# Patient Record
Sex: Female | Born: 1935
Health system: Southern US, Community
[De-identification: ages and names within clinical notes are randomized; demographics above are authoritative.]

## PROBLEM LIST (undated history)

## (undated) DIAGNOSIS — R112 Nausea with vomiting, unspecified: Secondary | ICD-10-CM

## (undated) DIAGNOSIS — I4891 Unspecified atrial fibrillation: Secondary | ICD-10-CM

## (undated) DIAGNOSIS — M542 Cervicalgia: Secondary | ICD-10-CM

## (undated) DIAGNOSIS — T8859XA Other complications of anesthesia, initial encounter: Secondary | ICD-10-CM

## (undated) DIAGNOSIS — C4492 Squamous cell carcinoma of skin, unspecified: Secondary | ICD-10-CM

## (undated) DIAGNOSIS — M069 Rheumatoid arthritis, unspecified: Secondary | ICD-10-CM

## (undated) DIAGNOSIS — Z9889 Other specified postprocedural states: Secondary | ICD-10-CM

## (undated) DIAGNOSIS — T4145XA Adverse effect of unspecified anesthetic, initial encounter: Secondary | ICD-10-CM

## (undated) DIAGNOSIS — I10 Essential (primary) hypertension: Secondary | ICD-10-CM

## (undated) DIAGNOSIS — R42 Dizziness and giddiness: Secondary | ICD-10-CM

## (undated) DIAGNOSIS — I5032 Chronic diastolic (congestive) heart failure: Secondary | ICD-10-CM

## (undated) DIAGNOSIS — I509 Heart failure, unspecified: Secondary | ICD-10-CM

## (undated) DIAGNOSIS — R609 Edema, unspecified: Secondary | ICD-10-CM

## (undated) DIAGNOSIS — R0602 Shortness of breath: Secondary | ICD-10-CM

## (undated) HISTORY — DX: Edema, unspecified: R60.9

## (undated) HISTORY — PX: SHOULDER SURGERY: SHX246

## (undated) HISTORY — DX: Chronic diastolic (congestive) heart failure: I50.32

## (undated) HISTORY — PX: JOINT REPLACEMENT: SHX530

## (undated) HISTORY — PX: OTHER SURGICAL HISTORY: SHX169

## (undated) HISTORY — DX: Cervicalgia: M54.2

## (undated) HISTORY — DX: Heart failure, unspecified: I50.9

## (undated) HISTORY — DX: Shortness of breath: R06.02

---
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
## 1994-12-13 HISTORY — PX: REPLACEMENT TOTAL KNEE: SUR1224

## 1999-02-16 ENCOUNTER — Other Ambulatory Visit: Admission: RE | Admit: 1999-02-16 | Discharge: 1999-02-16 | Payer: Self-pay | Admitting: Family Medicine

## 1999-12-14 HISTORY — PX: HYSTEROSCOPY: SHX211

## 2000-03-03 ENCOUNTER — Other Ambulatory Visit: Admission: RE | Admit: 2000-03-03 | Discharge: 2000-03-03 | Payer: Self-pay | Admitting: Family Medicine

## 2000-03-22 ENCOUNTER — Encounter (INDEPENDENT_AMBULATORY_CARE_PROVIDER_SITE_OTHER): Payer: Self-pay | Admitting: Specialist

## 2000-03-22 ENCOUNTER — Other Ambulatory Visit: Admission: RE | Admit: 2000-03-22 | Discharge: 2000-03-22 | Payer: Self-pay | Admitting: Obstetrics and Gynecology

## 2000-06-09 ENCOUNTER — Ambulatory Visit (HOSPITAL_COMMUNITY): Admission: RE | Admit: 2000-06-09 | Discharge: 2000-06-09 | Payer: Self-pay | Admitting: Orthopaedic Surgery

## 2000-06-09 ENCOUNTER — Encounter: Payer: Self-pay | Admitting: Orthopaedic Surgery

## 2000-07-01 ENCOUNTER — Encounter (INDEPENDENT_AMBULATORY_CARE_PROVIDER_SITE_OTHER): Payer: Self-pay | Admitting: Specialist

## 2000-07-01 ENCOUNTER — Ambulatory Visit (HOSPITAL_COMMUNITY): Admission: RE | Admit: 2000-07-01 | Discharge: 2000-07-01 | Payer: Self-pay | Admitting: Obstetrics and Gynecology

## 2001-02-24 ENCOUNTER — Other Ambulatory Visit: Admission: RE | Admit: 2001-02-24 | Discharge: 2001-02-24 | Payer: Self-pay | Admitting: Family Medicine

## 2002-02-26 ENCOUNTER — Encounter: Payer: Self-pay | Admitting: Family Medicine

## 2002-02-26 ENCOUNTER — Encounter: Admission: RE | Admit: 2002-02-26 | Discharge: 2002-02-26 | Payer: Self-pay | Admitting: Family Medicine

## 2003-02-03 ENCOUNTER — Ambulatory Visit (HOSPITAL_COMMUNITY): Admission: RE | Admit: 2003-02-03 | Discharge: 2003-02-03 | Payer: Self-pay | Admitting: Orthopaedic Surgery

## 2003-02-03 ENCOUNTER — Encounter: Payer: Self-pay | Admitting: Orthopaedic Surgery

## 2003-02-26 ENCOUNTER — Encounter: Payer: Self-pay | Admitting: Family Medicine

## 2003-02-26 ENCOUNTER — Encounter: Admission: RE | Admit: 2003-02-26 | Discharge: 2003-02-26 | Payer: Self-pay | Admitting: Family Medicine

## 2003-04-10 ENCOUNTER — Ambulatory Visit (HOSPITAL_COMMUNITY): Admission: RE | Admit: 2003-04-10 | Discharge: 2003-04-10 | Payer: Self-pay | Admitting: Gastroenterology

## 2003-04-23 ENCOUNTER — Other Ambulatory Visit: Admission: RE | Admit: 2003-04-23 | Discharge: 2003-04-23 | Payer: Self-pay | Admitting: Family Medicine

## 2003-12-14 HISTORY — PX: CERVICAL SPINE SURGERY: SHX589

## 2004-02-04 ENCOUNTER — Encounter: Admission: RE | Admit: 2004-02-04 | Discharge: 2004-02-04 | Payer: Self-pay | Admitting: Orthopaedic Surgery

## 2004-02-04 ENCOUNTER — Encounter: Payer: Self-pay | Admitting: Orthopaedic Surgery

## 2004-02-28 ENCOUNTER — Encounter: Payer: Self-pay | Admitting: Neurosurgery

## 2004-02-29 ENCOUNTER — Inpatient Hospital Stay (HOSPITAL_COMMUNITY): Admission: EM | Admit: 2004-02-29 | Discharge: 2004-03-07 | Payer: Self-pay | Admitting: Emergency Medicine

## 2004-03-02 ENCOUNTER — Encounter (INDEPENDENT_AMBULATORY_CARE_PROVIDER_SITE_OTHER): Payer: Self-pay | Admitting: Cardiology

## 2004-09-10 ENCOUNTER — Inpatient Hospital Stay (HOSPITAL_COMMUNITY): Admission: RE | Admit: 2004-09-10 | Discharge: 2004-09-12 | Payer: Self-pay | Admitting: Orthopaedic Surgery

## 2006-10-03 ENCOUNTER — Encounter: Admission: RE | Admit: 2006-10-03 | Discharge: 2006-10-03 | Payer: Self-pay | Admitting: Orthopaedic Surgery

## 2006-11-20 ENCOUNTER — Encounter: Admission: RE | Admit: 2006-11-20 | Discharge: 2006-11-20 | Payer: Self-pay | Admitting: Rheumatology

## 2008-02-06 ENCOUNTER — Encounter: Admission: RE | Admit: 2008-02-06 | Discharge: 2008-02-06 | Payer: Self-pay | Admitting: Family Medicine

## 2008-02-15 ENCOUNTER — Encounter: Admission: RE | Admit: 2008-02-15 | Discharge: 2008-02-15 | Payer: Self-pay | Admitting: Family Medicine

## 2008-02-27 ENCOUNTER — Encounter (INDEPENDENT_AMBULATORY_CARE_PROVIDER_SITE_OTHER): Payer: Self-pay | Admitting: Diagnostic Radiology

## 2008-02-27 ENCOUNTER — Encounter: Admission: RE | Admit: 2008-02-27 | Discharge: 2008-02-27 | Payer: Self-pay | Admitting: Family Medicine

## 2008-02-27 ENCOUNTER — Other Ambulatory Visit: Admission: RE | Admit: 2008-02-27 | Discharge: 2008-02-27 | Payer: Self-pay | Admitting: Diagnostic Radiology

## 2008-04-11 ENCOUNTER — Inpatient Hospital Stay (HOSPITAL_COMMUNITY): Admission: RE | Admit: 2008-04-11 | Discharge: 2008-04-12 | Payer: Self-pay | Admitting: Orthopaedic Surgery

## 2010-12-18 ENCOUNTER — Ambulatory Visit (HOSPITAL_COMMUNITY)
Admission: RE | Admit: 2010-12-18 | Discharge: 2010-12-18 | Payer: Self-pay | Source: Home / Self Care | Attending: Rheumatology | Admitting: Rheumatology

## 2011-01-26 ENCOUNTER — Emergency Department (HOSPITAL_COMMUNITY): Payer: Medicare Other

## 2011-01-26 ENCOUNTER — Inpatient Hospital Stay (HOSPITAL_COMMUNITY)
Admission: EM | Admit: 2011-01-26 | Discharge: 2011-01-29 | DRG: 069 | Disposition: A | Discharge status: Home / Self Care | Payer: Medicare Other | Attending: Internal Medicine | Admitting: Internal Medicine

## 2011-01-26 ENCOUNTER — Encounter (HOSPITAL_COMMUNITY): Payer: Self-pay | Admitting: Radiology

## 2011-01-26 HISTORY — DX: Dizziness and giddiness: R42

## 2011-01-26 HISTORY — DX: Rheumatoid arthritis, unspecified: M06.9

## 2011-01-26 HISTORY — DX: Essential (primary) hypertension: I10

## 2011-01-26 LAB — ETHANOL: Alcohol, Ethyl (B): <5 mg/dL (ref 0–10)

## 2011-01-26 LAB — DIFFERENTIAL
Basophils Absolute: 0 10*3/uL (ref 0.0–0.1)
Lymphs Abs: 0.9 10*3/uL (ref 0.7–4.0)
Monocytes Relative: 5 % (ref 3–12)
Neutro Abs: 8.1 10*3/uL — ABNORMAL HIGH (ref 1.7–7.7)
Neutrophils Relative %: 85 % — ABNORMAL HIGH (ref 43–77)

## 2011-01-26 LAB — CBC
MCH: 31.7 pg (ref 26.0–34.0)
Platelets: 259 10*3/uL (ref 150–400)
RBC: 4.23 MIL/uL (ref 3.87–5.11)
WBC: 9.5 10*3/uL (ref 4.0–10.5)

## 2011-01-26 LAB — COMPREHENSIVE METABOLIC PANEL
ALT: 11 U/L (ref 0–35)
Alkaline Phosphatase: 43 U/L (ref 39–117)
BUN: 12 mg/dL (ref 6–23)
CO2: 23 mEq/L (ref 19–32)
Creatinine, Ser: 0.8 mg/dL (ref 0.4–1.2)
GFR calc Af Amer: >60 mL/min (ref >60)
Total Bilirubin: 0.5 mg/dL (ref 0.3–1.2)

## 2011-01-26 LAB — URINALYSIS, ROUTINE W REFLEX MICROSCOPIC
Bilirubin Urine: NEGATIVE
Urine Glucose, Fasting: NEGATIVE mg/dL

## 2011-01-26 LAB — CK TOTAL AND CKMB (NOT AT ARMC)
CK, MB: 2.5 ng/mL (ref 0.3–4.0)
Relative Index: INVALID (ref 0.0–2.5)

## 2011-01-26 LAB — PROTIME-INR: INR: 0.96 (ref 0.00–1.49)

## 2011-01-26 LAB — POCT CARDIAC MARKERS
CKMB, poc: <1 ng/mL — ABNORMAL LOW (ref 1.0–8.0)
Troponin i, poc: <0.05 ng/mL (ref 0.00–0.09)

## 2011-01-27 ENCOUNTER — Inpatient Hospital Stay (HOSPITAL_COMMUNITY): Payer: Medicare Other

## 2011-01-27 DIAGNOSIS — W19XXXA Unspecified fall, initial encounter: Secondary | ICD-10-CM | POA: Diagnosis present

## 2011-01-27 DIAGNOSIS — Z96619 Presence of unspecified artificial shoulder joint: Secondary | ICD-10-CM

## 2011-01-27 DIAGNOSIS — Z981 Arthrodesis status: Secondary | ICD-10-CM

## 2011-01-27 DIAGNOSIS — M19019 Primary osteoarthritis, unspecified shoulder: Secondary | ICD-10-CM | POA: Diagnosis present

## 2011-01-27 DIAGNOSIS — E871 Hypo-osmolality and hyponatremia: Secondary | ICD-10-CM | POA: Diagnosis present

## 2011-01-27 DIAGNOSIS — Z96659 Presence of unspecified artificial knee joint: Secondary | ICD-10-CM

## 2011-01-27 DIAGNOSIS — E876 Hypokalemia: Secondary | ICD-10-CM | POA: Diagnosis present

## 2011-01-27 DIAGNOSIS — S139XXA Sprain of joints and ligaments of unspecified parts of neck, initial encounter: Secondary | ICD-10-CM | POA: Diagnosis present

## 2011-01-27 DIAGNOSIS — M5412 Radiculopathy, cervical region: Secondary | ICD-10-CM | POA: Diagnosis present

## 2011-01-27 DIAGNOSIS — I10 Essential (primary) hypertension: Secondary | ICD-10-CM | POA: Diagnosis present

## 2011-01-27 DIAGNOSIS — G459 Transient cerebral ischemic attack, unspecified: Principal | ICD-10-CM | POA: Diagnosis present

## 2011-01-27 DIAGNOSIS — IMO0002 Reserved for concepts with insufficient information to code with codable children: Secondary | ICD-10-CM

## 2011-01-27 DIAGNOSIS — Z7982 Long term (current) use of aspirin: Secondary | ICD-10-CM

## 2011-01-27 DIAGNOSIS — Z79899 Other long term (current) drug therapy: Secondary | ICD-10-CM

## 2011-01-27 DIAGNOSIS — M069 Rheumatoid arthritis, unspecified: Secondary | ICD-10-CM | POA: Diagnosis present

## 2011-01-27 DIAGNOSIS — T502X5A Adverse effect of carbonic-anhydrase inhibitors, benzothiadiazides and other diuretics, initial encounter: Secondary | ICD-10-CM | POA: Diagnosis present

## 2011-01-27 LAB — BASIC METABOLIC PANEL
BUN: 8 mg/dL (ref 6–23)
GFR calc Af Amer: >60 mL/min (ref >60)
GFR calc non Af Amer: >60 mL/min (ref >60)
Potassium: 3.7 mEq/L (ref 3.5–5.1)
Sodium: 136 mEq/L (ref 135–145)

## 2011-01-27 LAB — URINE CULTURE
Colony Count: NO GROWTH
Culture  Setup Time: 201202142031
Culture: NO GROWTH

## 2011-01-27 LAB — LIPID PANEL
LDL Cholesterol: 66 mg/dL (ref 0–99)
VLDL: 18 mg/dL (ref 0–40)

## 2011-01-27 LAB — HEMOGLOBIN A1C
Hgb A1c MFr Bld: 5.3 % (ref <5.7)
Mean Plasma Glucose: 105 mg/dL (ref <117)

## 2011-01-27 LAB — CBC
MCV: 90.5 fL (ref 78.0–100.0)
Platelets: 275 10*3/uL (ref 150–400)
RDW: 12.4 % (ref 11.5–15.5)
WBC: 7 10*3/uL (ref 4.0–10.5)

## 2011-01-27 LAB — SODIUM, URINE, RANDOM: Sodium, Ur: 107 mEq/L

## 2011-01-27 LAB — OSMOLALITY, URINE: Osmolality, Ur: 574 mOsm/kg (ref 390–1090)

## 2011-01-27 LAB — APTT: aPTT: 25 seconds (ref 24–37)

## 2011-01-27 MED ORDER — GADOBENATE DIMEGLUMINE 529 MG/ML IV SOLN
11.0000 mL | Freq: Once | INTRAVENOUS | Status: AC
  Administered 2011-01-27: 11 mL via INTRAVENOUS

## 2011-01-28 LAB — BASIC METABOLIC PANEL
CO2: 27 mEq/L (ref 19–32)
Calcium: 8.6 mg/dL (ref 8.4–10.5)
Creatinine, Ser: 0.7 mg/dL (ref 0.4–1.2)
GFR calc Af Amer: >60 mL/min (ref >60)
Glucose, Bld: 94 mg/dL (ref 70–99)

## 2011-01-29 DIAGNOSIS — I059 Rheumatic mitral valve disease, unspecified: Secondary | ICD-10-CM

## 2011-02-02 LAB — CULTURE, BLOOD (ROUTINE X 2)
Culture  Setup Time: 201202150131
Culture: NO GROWTH

## 2011-02-06 NOTE — Discharge Summary (Signed)
[name], [name]              ACCOUNT NO.:  [id number]  MEDICAL RECORD NO.:  [id number]           PATIENT TYPE:  I  LOCATION:  3729                         FACILITY:  [facility]  PHYSICIAN:  Isidor Holts, M.D.  DATE OF BIRTH:  09/10/1928  DATE OF ADMISSION:  01/26/2011 DATE OF DISCHARGE:  01/29/2011                              DISCHARGE SUMMARY   PRIMARY MD:  Gentry Fitz.  However, the patient is scheduled to see Emeterio Reeve, MD  PRIMARY ORTHOPEDIC SURGEON:  Claude Manges. [name], MD  PRIMARY NEUROSURGEON:  Danae Orleans. Venetia Maxon, MD  DISCHARGE DIAGNOSES: 1. Possible transient ischemic attack. 2. Transient altered mental status. 3. Hyponatremia/hypokalemia, secondary to thiazide diuretics. 4. Left shoulder degenerative joint disease. 5. Cervical radiculopathy. 6. Cervical paraspinal muscle strain. 7. History of rheumatoid arthritis. 8. Hypertension.  DISCHARGE MEDICATIONS: 1. Ultram 25 mg p.o. p.r.n. q.8 hourly for pain. A 7-day supply has     been dispensed. 2. Amlodipine 10 mg p.o. daily (was on 5 mg p.o. daily). 3. Aspirin 325 mg p.o. daily (was on 81 mg p.o. daily). 4. Enalapril 20 mg p.o. b.i.d. (was on 20 mg p.o. daily). 5. Calcium carbonate/vitamin D OTC 1 tablet p.o. b.i.d. 6. Docusate 100 mg p.o. b.i.d. 7. Fish oil 1000 mg p.o. daily. 8. Meloxicam 7.5 mg p.o. daily. 9. Metoprolol tartrate 50 mg p.o. b.i.d. 10.Plaquenil 200 mg p.o. b.i.d. 11.Prednisone 5 mg p.o. daily. 12.Vitamin D3 1000 units p.o. b.i.d.  Note: Hydrochlorothiazide has been discontinued, secondary to     electrolyte abnormalities.  CONSULTATIONS: 1. Claude Manges. [name], MD, orthopedic surgeon. 2. Telephone discussion with Hilda Lias, MD, neurosurgeon.  PROCEDURES: 1. Chest x-ray January 26, 2011.  This showed no acute     cardiopulmonary process.  There was emphysematous change. 2. Head CT scan January 26, 2011.  This showed no acute finding. 3. Brain MRI/MRA January 27, 2011.  This  showed no acute infarct.     There was nonspecific pituitary lesion which may represent a     Rathke's cleft cyst previously 5 x 6 x 12 mm now 8.8 x 7.7 x6.1 mm.     MRA showed mild branch vessel irregularity.  AICA is not     visualized. 4. Neck MRA January 28, 2011.  This showed focal narrowing of the     proximal right internal carotid artery with 50% diameter narrowing.     No evidence of measurable stenosis involving the proximal left     internal carotid artery.  Minimal narrowing proximal vertebral     arteries.  Ectasia of the proximal left vertebral artery with focal     mild narrowing. 5. MRI C-spine January 28, 2011.  This showed solid anterior fusion     from C2-C6 with no residual impingement.  Focal myelomalacia of the     left side of spinal cord at C3-C4.  Slight edema in the posterior     paraspinal soft tissue from C7-T2 just to the left of midline which     could represent muscle strain.  Left foraminal stenosis at C6-C7     due to uncinate spurs. 6. A  2-D echocardiogram January 29, 2011, report was still pending at     the time of this dictation.  ADMISSION HISTORY:  As in H and P notes of January 26, 2011, dictated by Dr. Della Goo.  However, in brief, this is an 77 year old female, with known history of rheumatoid arthritis under the care of Dr. Corliss Skains, rheumatologist, hypertension, degenerative disk disease, vertigo, previous history of cervical spine fusion, status post right total knee replacement, status post bilateral shoulder replacement, presenting following an episode of transient confusion as well as possible left-sided weakness while at her rheumatologist office for an appointment.  She was brought to the emergency department by EMS.  At the time she arrived at the ED, her symptoms had resolved. She was subsequently admitted for further evaluation, investigation and management.  CLINICAL COURSE: 1. Possible TIA.  For details of  presentation, refer to admission     history above.  The patient had no focal neurologic deficit on     physical examination.  She underwent CAD/CVA workup.  For details     of findings, refer to procedure list above.  No concerning findings     were documented, although she did have a focal 50% right ICA     carotid stenosis.  This is likely not the course of her     symptomatology.  The patient was already on low-dose aspirin.     Preadmission dose increased to 325 mg p.o. daily.  She was     evaluated by PT, OT during the course of this hospitalization.  2. Electrolyte abnormalities.  The patient did have hyponatremia of     127 at presentation, with a potassium of 3.4.  This was deemed     likely secondary to the patient's thiazide diuretic therapy.     Thiazide was discontinued.  The patient was managed with     intravenous infusion of normal saline.  As of January 28, 2011,     sodium was reasonable at 133 and potassium was 4.0 following     repletion.  3. Hypertension.  Following discontinuation of hydrochlorothiazide,     patient's blood pressure, started creeping up.  Amlodipine has     been increased to 10 mg p.o. daily and enalapril to 20 mg p.o. b.i.d.  4. Cervical radiculopathy/cervical paraspinal muscle strain.  The     patient did complain of pain down her left arm, which she had noted     since having a fall in late January 2012.  Reportedly, this had     become worse and according to the patient, she had undergone     considerable positioning while trying to obtain a CT scan in     January 2012.  She is increasingly troubled by this.  On suspicion     of possible radiculopathy, MRI of the cervical spine was done.     This demonstrated C6-C7 neuroforaminal stenosis secondary to     uncinate spurs which could presumably cause nerve impingement.  In     addition, she also had a posterior paraspinal muscle edema just at     left of the midline which could suggest a  strain.  We did consult     Dr. Hilda Lias, who was covering for Dr. Venetia Maxon, Neurosurgeon.  He     spoke with me over the phone, personally, reviewed the MRI     findings and has opined that the patient does indeed have a     strain.  He recommended PT, OT as well as analgesics and has     recommended that the patient followup in the office, should symptoms     not improve.  5. History of rheumatoid arthritis.  The patient is clinically stable     from this viewpoint.  She will continue to follow up with her primary     rheumatologist on discharge.  6. Hypertension.  The patient's blood pressure did creep up somewhat     following discontinuation of her hydrochlorothiazide. As described     above, adjustments have been made to her enalapril and amlodipine dosage.  DISPOSITION:  The patient was considered clinically stable for discharge on January 29, 2011.  ACTIVITY:  As tolerated. Recommended to increase activity slowly.  DIET:  Heart-healthy.  FOLLOWUP INSTRUCTIONS:  The patient is to follow up routinely with her primary orthopedic surgeon, Dr. Norlene Campbell.  Follow up routinely with her primary rheumatologist, Dr. Corliss Skains.  She is currently scheduled to commence follow up with Dr. Paulino Rily, her new primary MD.  She has been urged to keep this appointment.  In addition, she will follow up p.r.n. with Dr. Venetia Maxon, should she continue to have left arm pain.  All of this have been communicated to the patient and her family. They have verbalized understanding.     Isidor Holts, M.D.     CO/MEDQ  D:  01/29/2011  T:  01/30/2011  Job:  [id number]  cc:   Emeterio Reeve, MD Danae Orleans Venetia Maxon, M.D. Claude Manges. [name], M.D. Pollyann Savoy, M.D.  Electronically Signed by Isidor Holts M.D. on 02/06/2011 01:41:52 PM

## 2011-02-15 NOTE — H&P (Signed)
[name], [name]              ACCOUNT NO.:  [id number]  MEDICAL RECORD NO.:  [id number]           PATIENT TYPE:  E  LOCATION:  [facility]                         FACILITY:  [facility]  PHYSICIAN:  Della Goo, M.D. DATE OF BIRTH:  04/15/1928  DATE OF ADMISSION:  01/26/2011 DATE OF DISCHARGE:                             HISTORY & PHYSICAL   PRIMARY CARE PHYSICIAN:  Unassigned  CHIEF COMPLAINT:  Left-sided weakness, facial droop, and confusion.  HISTORY OF PRESENT ILLNESS:  This is an 77 year old female who was brought to the emergency department after suffering an episode of sudden facial droop, left-sided weakness along with confusion.  This occurred while she was at her rheumatologist's office for an appointment in the afternoon.  The patient states that she fell prior to the episode, she felt suddenly sick, dizzy, and nauseous.  She felt as if she would faint.  She denied having any headache or chest pain or shortness of breath.  EMS was called and by the time EMS arrived, her symptoms had resolved.  The patient was brought to the emergency department for further evaluation.  The patient also has complaints of worsening pain in her left arm.  She states this pain has been there since a fall she suffered in January. She describes pain that radiated down her left arm into her fingers. She was sent for a CT scan of the right shoulder which was done in December 18, 2010, to evaluate for an occult fracture.  There were no fractures that were seen.  The patient was found to have an effusion around the left elbow.  The CT scan was ordered by her rheumatologist, Dr. Corliss Skains.  PAST MEDICAL HISTORY:  Significant for rheumatoid arthritis, hypertension, degenerative disk disease, and vertigo.  PAST SURGICAL HISTORY:  History of cervical spine fusion, right total knee replacement, left shoulder replacement x2, right shoulder replacement x1.  MEDICATIONS:  Her medications at this time  include prednisone, Plaquenil, stool softener, meloxicam, enalapril, metoprolol, vitamin D, fish oil, and aspirin.  ALLERGIES:  No known drug allergies.  SOCIAL HISTORY:  The patient is a nonsmoker and nondrinker.  No history of illicit drug usage.  FAMILY HISTORY:  Positive for hypertension in her mother and her father had Alzheimer dementia.  REVIEW OF SYSTEMS:  Pertinents are mentioned above.  PHYSICAL EXAMINATION FINDINGS:  GENERAL:  This is an 77 year old thin, well-nourished, well-developed female who is in no visible discomfort or acute distress. VITAL SIGNS:  Temperature 98.3, blood pressure 158/61, heart rate 61, respirations 14, and O2 saturations 99%. HEENT:  Normocephalic and atraumatic.  Pupils are equally round reactive to light.  Extraocular movements are intact.  Funduscopic benign.  There is no scleral icterus.  Nares are patent bilaterally.  Oropharynx is clear. NECK:  Supple full range of motion.  No thyromegaly, adenopathy, or jugular venous distention. CARDIOVASCULAR:  Regular rate and rhythm.  No murmurs, gallops, or rubs appreciated.  LUNGS:  Clear to auscultation bilaterally.  No rales, rhonchi, or wheezes. ABDOMEN:  Positive bowel sounds, soft, nontender, and nondistended.  No hepatosplenomegaly. EXTREMITIES:  Without cyanosis, clubbing, or edema.  The upper extremities do  have stenting and ecchymotic areas from her prednisone treatment. NEUROLOGIC:  The patient is alert and oriented x3.  Cranial nerves are intact.  She is able to move all 4 of her extremities.  She has limited range of motion in her left shoulder.  The patient walks with a cane. VASCULAR:  Her radial and dorsal pedal pulses are intact.  LABORATORY STUDIES:  Chemistry with a sodium of 129, potassium 3.4, chloride 93, CO2 of 23, BUN 12, creatinine 0.80, glucose of 143. Protime 13.0, INR 0.96, lactic acid level 1.2, and alcohol level less than 5.0.  Point-of-care cardiac markers with a  myoglobin of 95.4, CK-MB less than 1.0, troponin less than 0.05, total creatinine kinase 81, CK- MB 2.5, and troponin 0.01.  Urinalysis negative.  EKG normal sinus rhythm.  No acute ST-segment changes seen.  CT scan of the head performed and found to be negative for any acute findings.  Chest x-ray also reveals no acute disease process.  ASSESSMENT:  An 77 year old female being admitted with: 1. Transient ischemic attack. 2. Altered mental status. 3. Hypertension. 4. Hyponatremia. 5. Rheumatoid arthritis. 6. Mild hypokalemia. 7. Left shoulder radiculopathy.  PLAN:  The patient will be admitted for 23-hour observation to a telemetry bed.  The TIA protocol has been initiated.  Neurologic checks will be performed, cardiac enzymes.  The patient will be sent for MRI, MRA of the brain if she can have this study, however she does have bilateral shoulder replacements with.  The patient's regular medications will be further verified.  She will be placed on aspirin therapy and DVT prophylaxis will also be ordered.  The patient is a full code at this time and further workup will ensue pending results of the patient's clinical course.  The Orthopedic Service will be consulted for evaluation of this patient.  The patient sees regularly Dr. [name] who will be notified of the patient's admission and her left shoulder problems.  Dr. [name] may see the patient in the a.m. or make an arrangement to see the patient as outpatient.     Della Goo, M.D.     HJ/MEDQ  D:  01/26/2011  T:  01/26/2011  Job:  [id number]  Electronically Signed by Della Goo M.D. on 02/15/2011 08:17:33 PM

## 2011-04-27 NOTE — Op Note (Signed)
NAMEOLIVER, NEUWIRTH              ACCOUNT NO.:  [id number]   MEDICAL RECORD NO.:  [id number]          PATIENT TYPE:  INP   LOCATION:  5009                         FACILITY:  [facility]   PHYSICIAN:  Claude Manges. Whitfield, M.D.DATE OF BIRTH:  May 21, 1928   DATE OF PROCEDURE:  04/11/2008  DATE OF DISCHARGE:                               OPERATIVE REPORT   PREOPERATIVE DIAGNOSES:  End-stage osteoarthritis, right shoulder, with  rotator cuff insufficiency.   POSTOPERATIVE DIAGNOSES:  1. End-stage osteoarthritis, right shoulder, with rotator cuff      insufficiency.  2. 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   PROCEDURE:  Right shoulder hemiarthroplasty with computed tomographic  angiography head.   SURGEON:  Claude Manges. [name], MD   ASSISTANT:  Lenard Galloway. Chaney Malling, MD   ANESTHESIA:  General with supplemental interscalene nerve block.   COMPLICATIONS:  None.   COMPONENTS:  DePuy Global Advantage 12-mm humeral stem, a 44-mm outer  diameter CTA head with a 23-mm neck.   PROCEDURE:  With the patient comfortable on the operating table and  under general orotracheal anesthesia, the patient was placed in a semi-  sitting position with the Allen shoulder frame.  The patient did receive  a preoperative interscalene nerve block in the holding area.  The right  shoulder was then prepped with Betadine scrub and then DuraPrep with the  base of the neck circumferentially to the wrist, sterile draping was  performed.   Marking pen was used to outline an incision in the deltopectoral grooves  beginning at the level of the coronoid extending to the axillary fold.  By a sharp dissection, incision was carried down to the subcutaneous  tissue.  Gross bleeders were Bovie coagulated.  Adipose tissue was then  incised.  By a blunt dissection, I was able to locate the deltopectoral  groove.  The cephalic vein was carefully protected and retracted  laterally.  By a further blunt dissection, the  deltopectoral groove was  delineated.  Self-retaining retractors were inserted.  There was  abundant adipose tissue in the area of the clavipectoral fascia.  This  was incised, so that I could retract it medially and laterally.  The  coracoid was identified.  The conjoined tendon was identified and  carefully retracted.  There was considerable bulging of the capsule and  synovial fluid.  The subscapularis was identified.  It was tagged  superiorly and inferiorly and then incised with a Bovie at about a  centimeter from its attachment to the greater tuberosity.  There was  abundant cloudy yellow joint fluid.  There was also considerable  synovial hypertrophy with what appeared to be synovial  osteochondromatosis.  There was a similar finding when she had this  shoulder hemiarthroplasty in the left shoulder years ago.  A synovectomy  and capsulectomy was performed.   The head was then delivered into the wound.  There was complete absence  of articular cartilage.  At the superior aspect of the head and in the  midline, a small drill hole was then made.  Rasping was performed  sequentially with the hand reamers to a level of 12 mm.  The external  cutting guide was then applied to the reamer with the appropriate amount  of retroversion, an osteotomy was then made of the head.  We tracked it  around the humeral head and osteophytes were removed with an osteotome,  as there were considerable osteophytes inferiorly and posteriorly.   The glenoid was inspected.  It appeared to be minimally misshapen, and  there was still some articular cartilage remaining.  There were no rough  spots.   Further synovectomy was then performed as we had better visualization of  the joint.   Rasping was then performed sequentially from an 8 to a 10 and then 12-mm  rasp.  We trialed a standard humeral head, and we felt that the  retroversion was perfect, and the broach fit perfectly on the cut edge  of the  humeral head.   We elected to use a CTA head because of the patient's significant  autoimmune disease, synovitis, and the fact that in her previous  shoulder, she had developed a rotator cuff insufficiency over time and  required further surgery.  In order to avoid any further problems, we  felt that the CTA head was the best approach.  Accordingly, the CTA  cutting guide was then applied to the broach and a cut was then made in  the superior humeral head.  We carefully retracted the biceps tendon.  There was synovitis of the biceps tendon sheath, this was resected, and  I thought the biceps tendon otherwise was fine.  There were some areas  that were slightly frayed, but the majority of the tendon appeared to be  intact, and I felt that it was appropriate to preserve it.   We then tried the 44-mm outer diameter CTA head.  We tried both the 18-  and the 23-mm neck length and felt that the 23 was still loose enough  that we would have at least 50% posterior subluxation, slight inferior  subluxation, and we were able to raise the arm over the head without any  impingement.   The CTA head was then removed.  The 12-mm broach was then removed.  The  joint was copiously irrigated with saline solution.  With retractors  carefully placed about the humeral head, the 12-mm Global Advantage  humeral stem was then impacted flush on the cut surface of the humerus.  We cleaned the reverse Morse taper neck and then inserted the 44-mm  outer diameter CTA head with a 23-mm neck length.  This fit nicely  without instability.  The joint was again inspected without evidence of  loose material.  The joint was then reduced and we had excellent range  of motion, no impingement, and no further evidence of synovitis.   I again inspected the biceps tendon and felt that it was not pathologic  enough to perform a tenodesis.  The CA ligament appeared to be intact.   Subscapularis was then reattached anatomically  with #1 Ethibond.  The  deltopectoral groove was closed with a running 0-Vicryl.  The cephalic  vein remained intact.  The subcu was closed in several layers with 0 and  2-0 Vicryl.  The skin closed with Steri-Strips over Benzoin.  Sterile  bulky dressing was applied followed by a sling.   The patient tolerated the procedure well without complications.      Claude Manges. [name], M.D.  Electronically Signed     PWW/MEDQ  D:  04/11/2008  T:  04/12/2008  Job:  [id number]

## 2011-04-30 NOTE — Consult Note (Signed)
[name], [name]                        ACCOUNT NO.:  [id number]   MEDICAL RECORD NO.:  [id number]                   PATIENT TYPE:  INP   LOCATION:  3729                                 FACILITY:  [facility]   PHYSICIAN:  Gustavus Messing. Orlin Hilding, M.D.          DATE OF BIRTH:  1928-06-29   DATE OF CONSULTATION:  03/02/2004  DATE OF DISCHARGE:                                   CONSULTATION   CHIEF COMPLAINT:  Syncope.   HISTORY OF PRESENT ILLNESS:  [name] is a 77 year old white woman with a  history of hypertension, hyperlipidemia, and cervical myelopathy due to have  cervical surgery tomorrow by Dr. Venetia Maxon who was shopping with her daughter on  Saturday afternoon, was sitting on a bench, felt dizziness, felt her vision  blacking out, and then passed out. Daughter who is a nurse said that she  seemed briefly apneic and that her hands drew up, and she had a funny look  on face but no incontinence, tongue biting, or compulsive activity. There  was no significant post ictal period. When she tried to sit up, she felt  dizzy again.   REVIEW OF SYSTEMS:  Positive for left shoulder pain and some Lhermitte's  type phenomenon. She also has seasonal allergies. No chest pain or shortness  of breath.   PAST MEDICAL HISTORY:  1. Significant for hypertension.  2. Hyperlipidemia.  3. Right total knee replacement.  4. Remote hysterectomy.  5. Cervical myelopathy due for surgery soon.  6. Left shoulder problems, due for surgery.  7. She also has diverticulosis.   MEDICATIONS:  1. Aspirin.  2. Lisinopril.  3. Hydrochlorothiazide.  4. Prempro.  5. Lipitor.  6. Clarinex.  7. Flonase.  8. Mobic.  9. Unisom.   ALLERGIES:  No known drug allergies. She has poor tolerance to narcotics,  however.   SOCIAL HISTORY:  She is married, retired. Remote cigarette use, no alcohol.   FAMILY HISTORY:  Stepsister had seizures related to a brain tumor.   PHYSICAL EXAMINATION:  VITAL SIGNS:  On exam,  temperature 98, pulse 68,  respirations 20, BP 114/60, 97% saturation.  HEENT:  Head is normocephalic, atraumatic.  NECK:  Supple without bruits.  HEART:  Regular rhythm.  EXTREMITIES:  Without edema. Decreased range of motion of the left shoulder.  NEUROLOGICAL:  Mental status:  She is awake, alert, perfectly oriented with  normal language and cognition. Cranial nerves:  Pupils were equal and  reactive. Visual fields were full. Extraocular movements were intact. Facial  sensation was normal. Facial and motor activity was intact. Hearing is  intact. Palate symmetric. Tongue is midline. On motor exam, she has normal  station and gait; normal bulk, tone and strength throughout with exception  of limited left upper extremity proximal range of motion and pain. There is  drift or satelliting. Normal rapid fine movements. No fasciculations,  atrophy, or tremor. Reflexes are brisk at 3+ but no clonus.  Questionable  upgoing toe on the left, normal on the right. Coordination:  Finger to nose,  heel to shin are intact. Sensory exam is intact. MRI of the brain is  essentially normal with minimal atrophy and small vessel disease compatible  with her medical history.   STUDIES:  MRA shows about a 50% right ICA origin stenosis, not considered  significant, and normal vertebrobasilar system.   LABORATORY DATA:  Unremarkable.   IMPRESSION:  Syncope, not convincing for seizure or vertebrobasilar  insufficiency. Normal exam except for left shoulder and myelopathic  findings. MRI and MRA are unrevealing.   RECOMMENDATIONS:  For completeness sake, would get an EEG to rule out  seizure activity but I doubt this is the source. Would also check  orthostatics and agree with cardiac evaluation. She is asymptomatic since  admission.                                               Catherine A. Orlin Hilding, M.D.    CAW/MEDQ  D:  03/02/2004  T:  03/03/2004  Job:  [id number]   cc:   Danae Orleans. Venetia Maxon, M.D.  [address]  [place]  [place] [address]  Fax: [phone]

## 2011-04-30 NOTE — Discharge Summary (Signed)
[name], [name]              ACCOUNT NO.:  [id number]   MEDICAL RECORD NO.:  [id number]          PATIENT TYPE:  INP   LOCATION:  5037                         FACILITY:  [facility]   PHYSICIAN:  [name], M.D.DATE OF BIRTH:  1928/03/12   DATE OF ADMISSION:  09/10/2004  DATE OF DISCHARGE:  09/12/2004                                 DISCHARGE SUMMARY   ADMISSION DIAGNOSES:  1.  End-stage osteoarthritis, left shoulder.  2.  Hypertension.  3.  Hypercholesterolemia.  4.  Seasonal allergies.  5.  Small cataract.  6.  Severe degenerative disk disease, lumbar spine.  7.  Cervical radiculopathy status post C3-C4, C4-C5 fusion, 3/05.   DISCHARGE DIAGNOSES:  1.  End-stage osteoarthritis, left shoulder, status post left shoulder      hemiarthroplasty.  2.  Acute blood loss anemia secondary to surgery.  3.  Hypertension.  4.  Hypercholesterolemia.  5.  Seasonal allergies.  6.  Small cataract.  7.  Degenerative disk disease, lumbar spine.  8.  History of cervical radiculopathy status post C3-C4, C4-C5 fusion,      03/05/04.   SURGICAL PROCEDURE:  On 09/10/04, Ms. [name] underwent a left shoulder  hemiarthroplasty with removal of loose bodies by Dr. Claude Manges. Lamb  assisted by Dr. Rinaldo Lamb and [name], P.A.-C.  Complications:  None.   CONSULTATIONS:  Physical therapy, occupational therapy and case management  consulted on 09/11/04.   HISTORY OF PRESENT ILLNESS:  This 77 year old white female patient presented  to Dr. Cleophas Lamb with a three-year history of gradual onset of progressive  left shoulder pain.  The patient has had no known injury to her shoulder but  has been having problems for a while.  The pain is constant, stabbing and  aching over the shoulder with increasing range of motion of the shoulder and  decreased with rest.  The shoulder grinds and itches.  The pain awakens her  at night.  Her surgery initially was cancelled for March due to cervical  radicular and she subsequently had a cervical fusion and now is ready for  her left shoulder hemiarthroplasty.   HOSPITAL COURSE:  Ms. [name] tolerated her surgical procedure well without  any immediate postoperative complications.  She was transferred to 5000.  On  postoperative day 1 she was afebrile, vital signs stable.  Hemoglobin 10.3,  hematocrit 29.3, the arm was neurovascular intact.  She was switched to p.o.  pain medications and was out of bed with therapy.  On postoperative day 2  she was doing well.  The pain was controlled with medications, she was  afebrile, the arm was neurovascularly intact and the incision benign.  It  was felt that she was ready for discharge home.  She was discharged home  later that day.   DISCHARGE INSTRUCTIONS:  Diet:  She can resume her regular pre  hospitalization diet.   DISCHARGE MEDICATIONS:  She may resume her home medications.   1.  Caduet 5/10 mg p.o. q.p.m.  2.  Enalapril/hydrochlorothiazide 10/25 mg p.o. q.a.m.  3.  Clarinex 5 mg p.o. q.a.m.  4.  Flonase two  squirts q. nares daily.  5.  Aspirin 81 mg p.o. q.a.m.  6.  Stool softener q.p.m.  7.  Metoprolol 50 mg p.o. b.i.d.  8.  Unisom half tablet p.o. q.h.s.  9.  Mobic 7.5 mg p.o. q.a.m.   Additional medication includes:   Vicodin ES one to two tablets p.o. q.4h. p.r.n. for pain, #45 with no  refills.   ACTIVITY:  She is to be out of bed as tolerated with the left arm in a  sling.  She is to do pendulum exercises two to three times a day.  She can  use ice on that shoulder as needed.   WOUND CARE:  Please keep the left shoulder incision clean and dry, may  shower after no drainage from the wound for 2 days.  She is to notify Dr.  Cleophas Lamb if temperature greater than or equal to 101.5 degrees Fahrenheit,  chills, pain unrelieved by pain medications or foul smelling drainage from  the wound.   She is to arrange for home health PT per [facility].   FOLLOW UP:   She is to follow up with Dr. Cleophas Lamb in our office in  approximately 1 week and needs to call [phone] for that appointment.   LABORATORY DATA:  On 09/11/04, white count 13.1, hemoglobin 10.3, hematocrit  29.3, and platelets 294,000.  On 09/11/04, sodium 131, potassium 3.6, glucose  153.  All other laboratory studies were within normal limits.       KED/MEDQ  D:  10/15/2004  T:  10/15/2004  Job:  [id number]

## 2011-04-30 NOTE — H&P (Signed)
[name], REBMAN                        ACCOUNT NO.:  [id number]   MEDICAL RECORD NO.:  [id number]                   PATIENT TYPE:  INP   LOCATION:  NA                                   FACILITY:  [facility]   PHYSICIAN:  [name], P.A.                DATE OF BIRTH:  [birth year]   DATE OF ADMISSION:  02/11/2004  DATE OF DISCHARGE:                                HISTORY & PHYSICAL   CHIEF COMPLAINT:  Three year history of gradual onset left shoulder pain.   HISTORY OF PRESENT ILLNESS:  This 77 year old white female patient presents  to Dr. Cleophas Lamb with a three year history of gradual onset but  progressively worsening left shoulder pain. She has no known injury or prior  surgery to her shoulder but had been seen about two years ago for complaints  of pain in that shoulder. At this point, the pain is pretty much a constant  stabbing or aching sensation over that shoulder area. The intensity varies  throughout the day and is dependent upon what she does. Pain increases with  any range of motion of the shoulder and decreases with rest. The shoulder  does feel like it grinds at times and it also itches at times. The patient  awakens her at night, and she cannot sleep on that left side. She is  currently taking Mobic for pain relief, and that provides just a minimal  amount of relief.   ALLERGIES:  She has no specific drug allergies but has problems with nausea  due to pain medications.   CURRENT MEDICATIONS:  1. Lipitor 10 mg one tablet p.o. q.a.m.  2. Prempro 0.625/5 mg one tablet p.o. q.a.m.  3. Enalapril 10/25 mg one tablet p.o. q.a.m.  4. Clarinex 5 mg one tablet p.o. q.a.m.  5. Flonase nasal spray two squirts each naris q.a.m.  6. Mobic 7.5 mg one tablet p.o. q.a.m.  7. Baby aspirin 81 mg one tablet p.o. q.a.m.  8. Stool softener one tablet p.o. q.p.m.  9. Unisom half tablet p.o. q.h.s.   PAST MEDICAL HISTORY:  1. Hypertension for the last 15 years.  2.  Hypercholesterolemia.  3. Seasonal allergies.   She denies any history of diabetes mellitus, thyroid disease, hiatal hernia,  peptic ulcer disease, heart disease, asthma, or any other chronic medical  condition other than noted previously.   PAST SURGICAL HISTORY:  1. Right total knee arthroplasty by Dr. Claude Manges. Whitfield June 1996.  2. Hysteroscopy by Dr. Lake Lamb July 2001.   She denies any complications from the above mentioned procedures.   SOCIAL HISTORY:  She has a 30-pack-year history of cigarette smoking which  she quit about 35 years ago. She does not drink any alcohol nor use any  drugs. She is married and has two children. She and her husband live in a  one story house with one step into the main entrance. She  is retired from  part time office work. Her medical doctor is Dr. Talmadge Lamb, and her  phone number is [phone].   FAMILY HISTORY:  Mother died at the age of 61 with hypertension, emphysema,  and complications of pneumonia. Father died at the age of 28 with  complications from Alzheimer's disease. She did have one half sister who  died at age 24 with a brain tumor. Her children are age 53 and 58, and they  are alive and healthy.   REVIEW OF SYSTEMS:  She does have an early cataract. She complains of some  stiffness in her neck at times and recently has noted when she leans her  head forward to eat she gets sharp pain shooting down both arms. It now  seems to have localized to the left arm. She has been told in the past she  has a benign heart murmur. She does have occasional hemorrhoids. She has  significant degenerative disk disease in her lumbar spine and has problems  with bilateral feet numbness and left leg pain which is followed by Dr.  Venetia Lamb. Because of this, she does use a cane for ambulation. She does wear  glasses. She does not have a living will or a power of attorney. All other  systems are negative and noncontributory.   PHYSICAL  EXAMINATION:  GENERAL:  Thin, well-developed, well-nourished, white  female in no acute distress. Mood and affect are appropriate. Talks easily  with the examiner. Walks with a cane. Height 5 feet 4 inches, weight 160  pounds. BMI is 26.5.  VITAL SIGNS:  Temperature 97.7 degrees Fahrenheit, pulse 80, respirations  16, and BP 180/94.  HEENT:  Normocephalic, atraumatic without frontal or maxillary sinus  tenderness to palpation. Conjunctivae are pink. Sclerae are anicteric.  PERRLA. EOMs intact. No visible external ear deformities. Hearing grossly  intact. Tympanic membranes pearly gray bilaterally with good light reflex.  Mild amount of cerumen in each ear canal. Nose and nasal septum midline.  Nasal mucosa pink and moist without exudates or polyps noted. Buccal mucosa  pink and moist. Good dentition. Pharynx without erythema or exudate. Tongue  and uvula midline. Tongue without fasciculations and uvula rises equally  with phonation.  NECK:  No visible masses or lesions noted. Trachea midline. No palpable  lymphadenopathy or thyromegaly. Carotids +2 bilaterally without bruits. No  tenderness to palpation along the cervical spine, and she has pretty much  full range of motion of the cervical spine, but with extension of head, it  shoots pains down her left arm.  CARDIOVASCULAR:  Heart rate and rhythm regular. S1 and S2 present without  rubs, clicks, or murmurs noted.  RESPIRATORY:  Respirations even and unlabored. Breath sounds clear to  auscultation bilaterally without rales or wheezes noted.  ABDOMEN:  Rounded abdominal contour. Bowel sounds present x4 quadrants.  Soft, nontender to palpation without hepatosplenomegaly or CVA tenderness.  Femoral pulses +2 bilaterally.  BREASTS/GENITOURINARY/RECTAL/PELVIC:  These exams deferred at this time.  MUSCULOSKELETAL:  She has full range of motion of her hips, ankles, and toes bilaterally. DT and PT pulses are +2. She has a well healed right  knee  incision. Range of motion of her right knee is about equal to that of the  left. DT and PT pulses are +2. Negative straight leg raise bilaterally. She  has no obvious deformities bilateral elbows, wrists, and fingers. Radial  pulses are +2 bilaterally. She has full range of motion of her right  shoulder without pain.  No pain with palpation about the right shoulder and  minimal crepitus. Left shoulder skin is intact without erythema or  ecchymosis. She does have tenderness to palpation over the [facility] joint and a  moderate amount of crepitus with range of motion. Range of motion of that  left shoulder is decreased. She has forward flexion only to 90 degrees,  abduction to 60%, external rotation only of about 10 to 15 degrees, and  minimal to no internal rotation. Again a lot of crepitus with range of  motion. Radial pulse on the left side is 5/5.  NEUROLOGICAL:  Alert and oriented x3. Cranial nerves II-XII are grossly  intact. Upper extremity strength is 5/5. Lower extremity strength 5/5. Deep  tendon reflexes 2+ bilateral upper and lower extremities. Grip strength 5/5.   RADIOLOGIC FINDINGS:  X-rays taken of her left shoulder in January 2005  showed end-stage osteoarthritis with far amount of sclerosis of the glenoid  and considerable deformity of the humeral head with large inferior  osteophytes.   IMPRESSION:  1. End-stage osteoarthritis, left shoulder.  2. Hypertension.  3. Hypercholesterolemia.  4. Seasonal allergies.  5. Small cataract.  6. Severe degenerative disk disease, lumbar spine.  7. Cervical radiculopathy.   PLAN:  Dr. Cleophas Lamb discussed the patient with Dr. Venetia Lamb, her neurosurgeon.  We will be getting a MRI of her cervical spine to rule out any spinal  stenosis or herniated disk. If she is cleared for surgery from that  standpoint then she will undergo left total shoulder arthroplasty on February 11, 2004 by Dr. Cleophas Lamb. She will undergo all the routine  preoperative  laboratory tests and studies prior to this procedure.                                                Legrand Pitts Duffy, P.A.    KED/MEDQ  D:  02/04/2004  T:  02/04/2004  Job:  [id number]

## 2011-04-30 NOTE — H&P (Signed)
NAMEPRISCILLE, [name]              ACCOUNT NO.:  [id number]   MEDICAL RECORD NO.:  [id number]          PATIENT TYPE:  INP   LOCATION:  NA                           FACILITY:  [facility]   PHYSICIAN:  Claude Manges. Whitfield, M.D.DATE OF BIRTH:  11/04/1928   DATE OF ADMISSION:  09/10/2004  DATE OF DISCHARGE:                                HISTORY & PHYSICAL   CHIEF COMPLAINT:  A three-year history of left shoulder pain.   HISTORY OF PRESENT ILLNESS:  This 77 year old white female patient presented  to Dr. [name] with a three-year history of gradual onset of progressively  worsening left shoulder pain.  She had no known injury or prior surgery to  her shoulder but had been seen a few years previously for complaints of pain  there.  At this point, the pain is pretty much constant stabbing or aching  sensation over the shoulder area.  The intensity varies throughout the day  and is dependent upon what she does.  The pain increases with any range of  motion of the shoulder and decreases with rest.  It feels like it grinds at  times and also itches at times.  The patient does awake in the night due to  the pain, and she cannot sleep on that left side.  She is currently taking  Mobic for pain relief, and that provides just a minimal amount of relief.  She was initially scheduled for surgery on March 1 but was having cervical  pain and radicular symptoms and subsequently has had an anterior cervical  fusion at C3-4 and C4-5 on March 05, 2004, by Dr. Venetia Maxon.   ALLERGIES:  No specific drug allergies but has problems with nausea due to  PAIN MEDICINES.   CURRENT MEDICATIONS:  1.  Enalapril/HCTZ 10/25 mg 1 tablet p.o. q.a.m.  2.  Metoprolol 50 mg 1 tablet p.o. b.i.d.  3.  Mobic 7.5 mg 1 tablet p.o. q.a.m.  4.  Caduet 5/10 mg 1 tablet p.o. q.p.m.  5.  Aspirin 81 mg 1 tablet p.o. q.a.m.  6.  Flonase nasal spray 2 squirts each naris q.a.m.  7.  Clarinex 5 mg 1 tablet p.o. q.a.m.  8.  Stool softener 1  tablet p.o. every other p.m.  9.  Unisom 1/2 tablet p.o. q.h.s.   PAST MEDICAL HISTORY:  1.  Hypertension for 15 years.  2.  Hypercholesterolemia.  3.  Seasonal allergies.  4.  Significant osteoarthritis and degenerative disk disease of lumbar spine      and cervical spine.   She denies any history of diabetes mellitus, thyroid disease, hiatal hernia,  peptic ulcer disease, heart disease, or any other chronic medical condition  other than previously noted.   PAST SURGICAL HISTORY:  1.  Right total knee arthroplasty by Dr. Claude Manges. Whitfield June 1996.  2.  Hysteroscopy by Dr. Lake Bells July 2001.  3.  Anterior cervical fusion C3-4 and C4-5 by Dr. Venetia Maxon March 05, 2004.   Other than nausea, she denies any complications from the above-mentioned  procedures.   SOCIAL HISTORY:  She has a 30-pack-year history of cigarette  smoking which  she quit 35 years ago.  She does not drink any alcohol nor use any drugs.  She is married and has two children.  She and her husband live in a one-  story house with one step in to the main entrance.  She is retired from part-  time office work.  Her medical doctor is Dr. Talmadge Coventry, and her  phone number is [phone].   FAMILY HISTORY:  Mother died at the age of 54 with hypertension, emphysema,  and complications from pneumonia.  Father died at age 61 with complications  from Alzheimer's disease.  She had one half sister who died at age 9 with a  brain tumor.  Her children are ages 13 and 9, and they are alive and well.   REVIEW OF SYSTEMS:  She has an early stage cataract.  She has been told in  the past she has a benign heart murmur.  Complains of occasional  hemorrhoids.  Significant degenerative disk disease in her lumbar spine with  problems with bilateral feet numbness and left leg pain followed by Dr.  Venetia Maxon.  She does use a cane for ambulation because of these symptoms.  She  wears glasses.  She has some intermittent hoarseness since  her cervical  fusion.  She does have some urinary urgency and nocturia.  She does not have  a living will nor power of attorney.  All other systems are negative and  noncontributory.   PHYSICAL EXAMINATION:  GENERAL:  Well-developed, well-nourished, thin, white  female in no acute distress.  Mood and affect are appropriate.  Talks easily  with examiner.  Walks with a cane and a slight limp.  VITAL SIGNS: Height 5 feet 4 inches, weight 160 pounds.  BMI is 26.5.  Temperature 96.8  degrees F, pulse 60, respirations 16, blood pressure  182/96.  HEENT:  Normocephalic and atraumatic without frontal or maxillary sinus  tenderness to palpation.  Conjunctivae pink, sclerae anicteric.  PERRLA.  EOMs intact.  No visible external ear deformities.  Hearing grossly intact.  Tympanic membranes pearly grey bilaterally with good light reflex and  moderate amount of cerumen in both ear canals.  Nose and nasal septum  midline.  Nasal mucosa pink and moist without exudate or polyps noted.  Buccal mucosa pink and moist.  Good dentition.  Pharynx without erythema or  exudate.  Tongue and uvula midline.  Tongue without fasciculations, and  uvula rises equally with phonation.  NECK:  No visible masses or lesions noted.  She has a well-healed left-sided  cervical incision.  Trachea midline.  No palpable lymphadenopathy nor  thyromegaly.  Carotids +2 bilaterally with bruits.  Slightly decreased  cervical range of motion but otherwise no pain with range of motion or  palpation along the cervical spine.  CARDIOVASCULAR:  Heart rate and rhythm regular.  S1, S2 present without  rubs, clicks, or murmurs noted.  RESPIRATORY:  Respirations even and unlabored.  Breath sounds clear to  auscultation bilaterally without rales or wheezes noted.  ABDOMEN:  Rounded abdominal contour. Bowel sounds presents x 4 quadrants. Soft, nontender to palpation without hepatosplenomegaly or CVA tenderness.  EXTREMITIES:  Femoral pulses  +2 bilaterally.  BACK:  Nontender to palpation along the entire length of the vertebral  column at this time.  BREASTS/GU/RECTAL/PELVIC:  These exams deferred at this time.  MUSCULOSKELETAL:  She has full range of motion of her hips, ankles, and toes  bilaterally. DP and PT pulses +2.  She  has a well-healed right knee  incision.  She has full extension and flexion to about 100 to 110 degrees of  right nee with minimal crepitance.  No pain with palpation along the joint  line.  Range of motion of left knee is equal to that of  the right without  an crepitance.  No calf pain with palpation.  Negative Homan's sign.  She  has full range of motion of elbows, wrists, and fingers bilaterally.  Radial  pulses are +2.  She has full range of motion of right shoulder with pain and  no pain with palpation about that shoulder.  There is minimal crepitance.  Left shoulder skin is intact.  No erythema or ecchymosis.  She is tender to  palpation over the [facility] joint, and there is a moderate to large amount of  crepitance with range of motion of that left shoulder.  Range of motion on  the left is markedly decreased compared to the right.  Now she only has  forward flexion of about 60 degrees, abduction to about 45 or 50 degrees,  and only 10 to 15 degrees of external rotation and minimal to no internal  rotation.  Any attempt at range of motion of the shoulder causes pain.  NEUROLOGIC:  Alert and oriented x 3.  Cranial nerves II-XII grossly intact.  Strength 5/5 bilateral upper and lower extremities.  Rapid alternating  movements intact.  Deep tendon reflexes 2+ bilateral upper and lower  extremities.  Sensation intact to light touch.   RADIOLOGIC FINDINGS:  X-rays taken of the left shoulder at this time show  progression of end-stage osteoarthritis of the left shoulder with  significant sclerosis of the glenoid, deformity of the humeral head, with  large inferior osteophytes.   IMPRESSION:  1.   End-stage osteoarthritis left shoulder.  2.  Hypertension.  3.  Hypercholesterolemia.  4.  Seasonal allergies.  5.  Small cataract.  6.  Severe degenerative disk disease lumbar spine with numbness bilateral      feet and left leg pain.  7.  Cervical radiculopathy status post T3-4, 4-5 fusion March 05, 2004.   PLAN:  Ms. Cavenaugh will be admitted to [facility] on September 10, 2004, where the patient will undergo a left shoulder hemiarthroplasty by Dr.  Claude Manges. Whitfield.  She will undergo all the routine preoperative  laboratory tests and studies prior to this procedure.  She has been cleared  for surgery by Dr. Smith Mince and Dr. Venetia Maxon.  We will double check with him  and make sure that he does want her to wear her hard cervical collar while  she is in surgery.  If there are any medical issues while she is  hospitalized, will consult Dr. Smith Mince.      KED/MEDQ  D:  09/02/2004  T:  09/02/2004  Job:  [id number]

## 2011-04-30 NOTE — Op Note (Signed)
   [name], [name]                        ACCOUNT NO.:  [id number]   MEDICAL RECORD NO.:  [id number]                   PATIENT TYPE:  AMB   LOCATION:  ENDO                                 FACILITY:  [facility]   PHYSICIAN:  Anselmo Rod, M.D.               DATE OF BIRTH:  06-17-1928   DATE OF PROCEDURE:  04/10/2003  DATE OF DISCHARGE:                                 OPERATIVE REPORT   PROCEDURE:  Screening colonoscopy.   ENDOSCOPIST:  Anselmo Rod, M.D.   INSTRUMENT USED:  Olympus video panendoscope.   INDICATION FOR PROCEDURE:  Rectal bleeding with a longstanding history of  constipation in a 77 year old white female.  Rule out colonic polyps,  masses, etc.   PREPROCEDURE PREPARATION:  Informed consent was procured from the patient.  The patient fasted for eight hours prior to the procedure and prepped with a  bottle of magnesium citrate and a gallon of GoLYTELY the night prior to the  procedure.   PREPROCEDURE PHYSICAL:  VITAL SIGNS:  The patient had stable vital signs.  NECK:  Supple.  CHEST:  Clear to auscultation.  S1, S2 regular.  ABDOMEN:  Soft with normal bowel sounds.   DESCRIPTION OF PROCEDURE:  The patient was placed in the left lateral  decubitus position and sedated with 50 mg of Demerol and 5 mg of Versed  intravenously.  Once the patient was adequately sedate and maintained on low-  flow oxygen and continuous cardiac monitoring, the Olympus video colonoscope  was advanced from the rectum to the cecum without difficulty.  There was  some residual stool in the colon.  Multiple washes were done.  A few left-  sided diverticula were seen in the distal left colon.  External hemorrhoids  were appreciated on anal inspection.  The rest of the colonic mucosa  including the rectum, proximal left colon, transverse colon, right colon,  and cecum appeared normal.   IMPRESSION:  1. Small, nonbleeding external hemorrhoids.  2. Left-sided diverticulosis.   RECOMMENDATIONS:  1. A high-fiber diet with liberal fluid intake has been advocated, 20-25 g     of fiber have been recommended in the     diet.  2. Repeat colorectal cancer screening is recommended in the next five years     unless the patient develops any abnormal symptoms in the interim.                                               Anselmo Rod, M.D.    JNM/MEDQ  D:  04/10/2003  T:  04/10/2003  Job:  [id number]   cc:   Talmadge Coventry, M.D.  [address]  [place]  [place] [address]  Fax: [phone]

## 2011-04-30 NOTE — Op Note (Signed)
[name], [name]                        ACCOUNT NO.:  [id number]   MEDICAL RECORD NO.:  [id number]                   PATIENT TYPE:  INP   LOCATION:  3729                                 FACILITY:  [facility]   PHYSICIAN:  Danae Orleans. Venetia Maxon, M.D.               DATE OF BIRTH:  12/05/1928   DATE OF PROCEDURE:  03/05/2004  DATE OF DISCHARGE:                                 OPERATIVE REPORT   PREOPERATIVE DIAGNOSIS:  Spondylolisthesis of C3 on C4 with spondylosis,  stenosis, degenerative disc disease and cervical myelopathy, C3-C4 and C4-C5  levels.   POSTOPERATIVE DIAGNOSIS:  Spondylolisthesis of C3 on C4 with spondylosis,  stenosis, degenerative disc disease and cervical myelopathy, C3-C4 and C4-C5  levels.   PROCEDURE:  Anterior cervical decompression and fusion C3-C4 and C4-C5  levels with allograft bone graft and anterior cervical plate.   SURGEON:  Danae Orleans. Venetia Maxon, M.D.   ASSISTANT:  Hewitt Shorts, M.D.   ANESTHESIA:  General endotracheal anesthesia.   ESTIMATED BLOOD LOSS:  Minimal.   COMPLICATIONS:  None.   DISPOSITION:  Recovery room.   INDICATIONS FOR PROCEDURE:  [name] is a 77 year old woman with  cervical spondylitic myelopathy and cervical stenosis and mobile  spondylolisthesis of C3 on C4 with high signal in her cervical spinal cord  at the C3-C4 level.  She has marked spondylitic degenerative disease C3  through C7 levels with most severely affected levels C3-C4 and C4-C5 levels  and it was elected to perform surgery on these levels to decompress her  cervical spinal cord.   PROCEDURE:  [name] was brought to the operating room.  Following  satisfactory uncomplicated induction of general endotracheal anesthesia and  placement of intravenous  lines, the patient was placed in a supine position  on the operating table.  Her neck was maintained in neutral alignment.  She  was placed in 10 pounds of halter traction.  Her anterior neck was then  prepped and draped in the usual sterile fashion.  The area of planned  incision was infiltrated with 0.25% Marcaine and 0.5% lidocaine with  1:200,000 epinephrine.  An incision was made from the midline to the  anterior border of the sternocleidomastoid muscle at approximately the C4  level and carried sharply through the platysmal layer.  Subplatysmal  dissection was performed and then blunt dissection was performed along the  anterior border of the sternocleidomastoid muscle keeping the carotid sheath  lateral, trachea and esophagus medial, exposing the anterior cervical spine.  A bent spinal needle was placed at what was felt to be the C3-C4 level and  this was confirmed on interoperative x-ray.  The longus colli muscles were  taken down from the anterior cervical spine from C3 through C5 levels  bilaterally using electrocautery and Key elevator.  A self-retaining  Shadowline retractor along with up and down retractor was placed to  facilitate exposure.  The ventral osteophytes were  removed with a Leksell  rongeur.  The interspaces were incised with a 15 blade.  The disc material  was highly degenerated and disc spaces were collapsed.  Disc space spreaders  were placed and using a variety of Carlens microcurets, the endplates of C3  and C4 were decorticated and stripped of residual disc material and  similarly at C4-C5, decompression was performed.  Initially at the C4-C5  level, the microscope was brought onto the field and using high speed drill,  the endplates of C4 and C5 were decorticated and uncinate spurs were drilled  down.  The posterior longitudinal ligament was incised with an arachnoid  knife and removed in piecemeal fashion using a variety of 2 and 3 mm  Kerrison rongeurs.  Hemostasis was assured with Gelfoam soaked in thrombin.  After trial sizing, a 7 mm machined allograft wedge was then selected filled  with morselized bone allograft which had been preserved from the  drilling of  the endplates and this was then inserted into the interspace and counter  sunk appropriately.  Attention was turned to the C3-C4 level where similar  decompression was performed and, again, the spinal cord was decompressed as  were the C4 neural foramina.  Hemostasis was assured with Gelfoam soaked in  thrombin.  A similar size bone graft was packed with morselized bone  autograft inserted into the interspace and counter sunk appropriately.  A 34  mm Alphatek anterior cervical plate was then affixed to the anterior  cervical spine using 14 mm variable angle screws at C5, C4, and C3 level on  the right, the left C3 level a 12 mm by 4.5 mm screw was used.  All screws  had excellent purchase.  Locking mechanisms were engaged after final x-ray  confirmed position of bone graft and anterior cervical plate.  Prior to  placing the plate, the traction weight was removed.  The wound was then  copiously irrigated with Bacitracin saline.  The soft tissues were inspected  and found to be in good repair.  The platysmal layer was then closed with 3-  0 Vicryl sutures and the skin edges were reapproximated with a running 4-0  Vicryl subcuticular stitch.  The wound was dressed with Dermabond.  The  patient was extubated in the operating room and taken to the recovery room  in stable, satisfactory condition, having tolerated the operation well.  She  was placed in an Aspen collar.  Counts were correct at the end of the case.                                               Danae Orleans. Venetia Maxon, M.D.    JDS/MEDQ  D:  03/05/2004  T:  03/06/2004  Job:  [id number]

## 2011-04-30 NOTE — Discharge Summary (Signed)
[name], [name] NO.:  [id number]   MEDICAL RECORD NO.:  [id number]                   PATIENT TYPE:  INP   LOCATION:  3729                                 FACILITY:  [facility]   PHYSICIAN:  Hewitt Shorts, M.D.            DATE OF BIRTH:  11-Sep-1928   DATE OF ADMISSION:  02/29/2004  DATE OF DISCHARGE:  03/07/2004                                 DISCHARGE SUMMARY   ADMISSION HISTORY AND PHYSICAL EXAMINATION:  The patient is a 77 year old  woman which was admitted to the hospitalist service because she had a  syncopal episode.  Details of her admission history and physical examination  are included in her admission note.   HOSPITAL COURSE:  The patient was admitted by the hospitalist.  Seen in  neurology consultation by Dr. Dennie Fetters.  She underwent an EEG that was normal.  She underwent a transthoracic echocardiogram which showed a left ventricular  ejection fraction of 60%, mild mitral valve prolapse, mild mitral valve  regurgitation.  She had a CT of the head which showed mild atrophy and small  vessel disease without acute abnormality.  She had MRI of the brain and MRA  of the brain.  MRI showed atrophy and small vessel disease.  No acute  infarction.  MRA of the brain showed normal intracranial circulation.  MRA  of the external system showed a 50% stenosis of the origin of the right  internal carotid artery.  A nuclear medicine myocardial perfusion study  showed no myocardial ischemia.  These descriptions are solely on the basis  of the reports within the chart.  They have been thoroughly reviewed by Dr.  Orlin Hilding from the neurology service, and Dr. Sherrie Mustache from the hospitalist  service, and by the cardiologist from the [facility] and Vascular  Service.   In any event, all of these specialists felt that there was no particular  explanation for the patient's syncope, and she was cleared for surgery by  Dr. Venetia Maxon, and was therefore  subsequently transferred to Dr. Fredrich Birks service  for ongoing care.  They did add some additional medications for blood  pressure, which have been continued, and for which Dr. Sherrie Mustache from the  hospitalist service has given her discharge prescriptions.   In any event, the patient underwent a 2-level, C3-4 and C4-5 anterior  cervical diskectomy and arthrodesis with bone graft and plating by Dr. Venetia Maxon  on March 05, 2004, and has done well from that surgery.  She is up and  ambulating.  She does use a cane to give her support.  She is afebrile, and  denies any difficulties or distress.   DISCHARGE INSTRUCTIONS:  She has been given instructions regarding wound  care and activities following discharge.  Dr. Sherrie Mustache gave her a prescription  for Lopressor 50 mg b.i.d., 60 tablets, one refill, and Norvasc 5 mg q.h.s.,  30 tablets, one refill.  She also gave her  specific instructions to follow  up with the patient's primary physician, Dr. Talmadge Coventry, in 1 week,  to check on her blood pressure.  Dr. Sherrie Mustache also recommended that she  continue all of her home medications that she was taking prior to admission.  From the neurosurgical service, we gave the patient a prescription for  Vicodin tablets.  She is to take half a tablet to 1 tablet up to q.i.d.  p.r.n. for pain.  At the time of discharge, she is afebrile, her wound is  healing well.  She has a follow up appointment already scheduled with Dr.  Venetia Maxon.  She is immobilized in an aspen collar.  She has been given  instructions regarding wound care.   DISCHARGE DIAGNOSES:  1. Syncope.  2. Cervical spondylosis.  3. Cervical stenosis.  4. Cervical myelopathy.                                                Hewitt Shorts, M.D.    RWN/MEDQ  D:  03/07/2004  T:  03/07/2004  Job:  [id number]   cc:   Talmadge Coventry, M.D.  [address]  [place]  [place] [address]  Fax: [phone]

## 2011-04-30 NOTE — Op Note (Signed)
[name], DEBLOIS              ACCOUNT NO.:  [id number]   MEDICAL RECORD NO.:  [id number]          PATIENT TYPE:  INP   LOCATION:  2899                         FACILITY:  [facility]   PHYSICIAN:  Claude Manges. Whitfield, M.D.DATE OF BIRTH:  Jul 09, 1928   DATE OF PROCEDURE:  09/10/2004  DATE OF DISCHARGE:                                 OPERATIVE REPORT   PREOPERATIVE DIAGNOSIS:  End stage osteoarthritis, left shoulder.   POSTOPERATIVE DIAGNOSIS:  End stage osteoarthritis, left shoulder.  Diffuse  synovial osteochondromatosis and multiple loose bodies.   OPERATION PERFORMED:  Hemiarthroplasty, left humeral head and extensive  synovectomy with removal of loose bodies.   SURGEON:  Claude Manges. [name], M.D.   ASSISTANT:  1.  Lenard Galloway. Chaney Malling, M.D.  2.  Richardean Canal, P.A.   ANESTHESIA:  General orotracheal.   COMPLICATIONS:  None.   COMPONENTS:  Depuy global advantage, #12 humeral stem with a 48 mm outer  diameter humeral head and 18 mm neck length.   DESCRIPTION OF PROCEDURE:  With the patient comfortable on the operating  table and under general orotracheal anesthesia, nursing staff inserted a  Foley catheter.  The patient was then placed in a semisitting position using  a shoulder frame.  The left shoulder which was appropriately marked as the  operative side preoperatively, was then prepped with Betadine scrub and  DuraPrep from the face and neck circumferentially to the wrist.  Sterile  draping was performed.  A deltopectoral incision was utilized starting at  the coracoid and extending distally.  By sharp dissection, the incision was  carried down to the subcutaneous tissue.  By blunt dissection, soft tissue  was elevated to the level of the deltopectoral groove.  Small bleeders were  Bovie coagulated.  Self-retaining retractor was inserted.  The cephalic vein  was identified and retracted laterally as I developed the deltopectoral  groove.  A Gouley retractor was inserted to  mobilize the deltoid muscle  carefully.  The conjoined tendon was identified and carefully retracted  medially.  There was a large synovial tissue bulging from beneath the  coracoid and even inferiorly.  These were carefully incised with abundant  cloudy fluid exuding. There were numerous loose bodies that were removed as  well.  Synovectomy was performed.  The clavipectoral fascia was identified  and carefully excised.  The subscapularis tendon was also identified and  about 1 cm medial to the biceps tendon it was incised along with the capsule  from the humeral head.  There was a large synovial hypertrophy that was  adhered to the humeral head.  Preoperatively, the patient had over 45  degrees of external rotation.  The subscapularis was tagged superiorly and  inferiorly and retracted medially.  An extensive synovectomy was performed.  There were multiple, i.e. over 20 loose bodies measuring in size 1 cm in  diameter to probably 2 or 3 mm in diameter and these were carefully removed  from the synovium anteriorly, superiorly, inferiorly and even posteriorly.  There were multiple loose bodies in the inferior recess which we also  removed.  We could not feel  any further loose bodies but did a very good  synovectomy.  The head was then delivered through the wound.  Biceps tendon  was intact.  There was complete absence of articular cartilage and a  misshapen humeral head.  The glenoid was intact but with multiple areas of  loss of articular cartilage.   A starter hole was then made in the center of the humeral head.  Care was  taken to protect the biceps tendon.  I thought the rotator cuff was intact.  Reaming was then performed to 12 and then the rasps were inserted to a  number 12 which fit beautifully on the humeral head.  We trialed several  neck lengths and felt that the 48 mm diameter head with the 18 mm neck  length was well positioned.  We did not have to make but one cut in the   humeral head as we thought it was perfectly positioned in retroversion.  We  could sublux the head 50% anteriorly posteriorly and of course, anteriorly  with lack of the subscapularis tendon we had just a little inferior  translocation of the head.  We thought this was appropriate construct.  The  trial components were removed.  The joint was then copiously irrigated with  saline solution.  Again, we palpated the joint and did not feel any loose  bodies.  At that point, the final prosthesis was impacted and press fit with  the #12 Global Advantage humeral stem and a 48 mm outer diameter 18 mm neck  length.  It was then reduced and placed through full range of motion with  excellent movement.  The wound and joint was again irrigated with saline  solution.  Subscapularis was closed anatomically with #1 Ethibond.  The  rotator inflow was closed with the same material.  Biceps tendon was intact.  The deltopectoral groove was closed with a running 0 Vicryl, the  subcutaneous with 2-0 Vicryl, skin closed with skin clips.   Marcaine without epinephrine was injected into the wound postoperatively.  The patient did have an interscalene block preoperatively to help with  postoperative pain control.  The patient tolerated the procedure without  complications.       PWW/MEDQ  D:  09/10/2004  T:  09/11/2004  Job:  [id number]

## 2011-04-30 NOTE — Procedures (Signed)
HISTORY OF PRESENT ILLNESS:  This is a 77 year old woman with one episode of  syncope, rule out seizure.  This is a routine 17 channel EEG with one  channel devoted to EKG utilizing the International 10/20 lead placement  system.  The patient was described clinically as being awake and drowsy.  Electrographically, she appeared awake throughout.  Background consisted of  a well-organized, well-developed, well-modulated 10 Hertz alpha activity  just predominant in the posterior head regions and reactive to eye opening.  No interhemispheric asymmetries identified and no epileptiform discharges  are seen.  Both photic stimulation and hyperventilation were performed but  did not produce significant change in the background activity.  EKG monitor  reveals relatively irregular rhythm with a rate of 66 beats per minute.   CONCLUSION:  Normal EEG without seizure activity or other abnormality seen  during the course of today's recording.  Clinical correlation is  recommended.    Tyler Deis, M.D.   ZOX:WRUE  D:  03/02/2004 18:08:38  T:  03/02/2004 19:31:53  Job #:  [id number]

## 2011-04-30 NOTE — Discharge Summary (Signed)
NAMEROY, SNUFFER              ACCOUNT NO.:  [id number]   MEDICAL RECORD NO.:  [id number]          PATIENT TYPE:  INP   LOCATION:  5009                         FACILITY:  [facility]   PHYSICIAN:  Claude Manges. Whitfield, M.D.DATE OF BIRTH:  1928/09/28   DATE OF ADMISSION:  04/11/2008  DATE OF DISCHARGE:  04/12/2008                               DISCHARGE SUMMARY   FINAL DIAGNOSIS:  End-stage osteoarthritis of the right shoulder.   PROCEDURE WHILE IN HOSPITAL:  Right shoulder hemiarthroplasty using a  CTA head.  Components sizes were DePuy Global Advantage 12-mm humeral  stem, and 44-mm outer diameter CTA head with a 23-mm neck.   DISCHARGE SUMMARY:  The patient is a 77 year old woman with many-year  history of pain in the right shoulder after __________ fall and  struggling for many years with severe osteoarthritis of the right  shoulder.  She has had previous left shoulder hemiarthroplasty revised  to reverse arthroplasty with good results with pain relief.  She has  failed conservative care including risks of nonsteroidal anti-  inflammatories and would like to proceed with a right shoulder  hemiarthroplasty using a CTA humeral component.  Risks and benefits of  the surgery were discussed at length prior to the admission.  The  patient has many sensitivities to pain medication.  No drug allergies.  She tolerates Vicodin well.  She has a medical history significant for  hypertension, hypercholesterolemia, and osteoarthritis of multiple  joint.   SURGICAL HISTORY:  Significant for right knee arthroplasty in1996,  hysteroscopy in 2001, neck surgery with multiple fusions in 2005, left  shoulder replacement in 2005 with the right shoulder replacement in  2008, __________ .   MEDICATIONS AT THE TIME OF ADMISSION:  1. Enalapril 20 mg daily.  2. Raloxifene 4 times as needed.  3. Effexor XR 75 mg daily.  4. Stool softener b.i.d.  5. Caduet 5/10 mg daily.  6. Metoprolol 50 mg b.i.d.  7.  Aspirin 81 mg daily.  8. Clarinex 5 mg daily.  9. Lactaid as needed.  10.Calcium 600 mg daily.  11.Vitamin D 1000 units daily.  12.Cinnamon.  13.Anti-Gas medication.  14.Advil.  15.Mobic occasionally.   SOCIAL HISTORY:  The patient's social history is significant for  previous tobacco use, none since 1966.  No ethanol use.  No other drug  abuse history.  Now the patient lives with one other person in one-  storey residence.   FAMILY HISTORY:  Significant for mother died at the age of 32 with the  history of COPD and hypertension.  Father's medical history unknown.   REVIEW OF SYSTEMS:  Positive for glasses.  Occasional constipation, and  hypertension.   PHYSICAL EXAMINATION:  VITAL SIGNS:  Preoperatively the patient's  temperature is 95, pulse is 62, respirations 16, blood pressure 120/72.  She is 5 feet 4 inches, 160 pounds' female.  HEAD:  Normocephalic, atraumatic.  EARS:  TMs clear.  EYES:  Pupils equal, round, and reactive to light and accommodation.  NOSE:  Patent.  THROAT:  Benign.  NECK:  Supple.  Full range of motion.  CHEST:  Clear  to auscultation and percussion.  HEART:  Regular, rate, and rhythm.  ABDOMEN:  Soft and nontender.  EXTREMITIES:  The patient is neurovascularly intact to light touch and  moves all extremities.  She is alert and oriented x3.  SKIN:  Within normal limits with well-healed scars on previous surgical  areas.  MUSCULOSKELETAL:  The right shoulder range of motion with forward  flexion of 100 degrees with pain and crepitus.  Abduction is only 45  degrees again with pain.  Otherwise, neurovascularly intact.   LABORATORY DATA:  Preoperative labs including CBC, CMET, chest x-ray,  EKG, PT, and PTT are all within normal limits with the exception of  albumin is 3.3.   HOSPITAL COURSE:  On the day of the admission the patient was taken to  the operating room of [facility].  The patient underwent right  shoulder hemiarthroplasty with CTA head  by Dr. [name] with Dr.  Chaney Malling assisting.  The patient was placed on perioperative  antibiotics.  She was placed on postoperative pain control with a PCA  Dilaudid.  Physical therapy was begun on the first day postop.  On  postoperative day #1, the patient was alert and oriented, had only  minimal complaint of pain, temperature 97, pulse 67, respirations 18,  blood pressure 131/69.  Hemoglobin 10.4.  Urine was noted to be positive  on April 09, 2008, for positive colonies of bacteria.  Dressing was  clean and dry.  She was otherwise neurovascularly intact.  She was  making steady progress learning an exercise program for arm and was  beginning to mobilize out of bed.  She felt that in physical therapy  that she was making steady progress, and because she was able to  transfer with only minimal assist that she would have available at home,  she was discharged to home on that day.  At the time of her discharge,  she was medically stable and orthopedically improved.   MEDICATIONS AT THE TIME OF DISCHARGE:  1. Enalapril 20 mg daily.  2. Effexor XR 75 mg daily.  3. Caduet 5/10 mg daily.  4. Metoprolol 50 mg b.i.d.  5. Aspirin 81 mg p.o. daily.  6. Propoxyphene-N 100 as needed for pain.  7. Lactaid as needed for lactose intolerance.  8. Calcium 60 units daily.  9. Vitamin D 1000 international units daily.  10.Cinnamon 500 mg daily.  11.Zegerid daily.  12.Anti-Gas medication as needed.  13.Mobic 75 mg daily.   ACTIVITY:  At the time of discharge weightbearing as tolerated with  lower extremities, nonweightbearing upper extremities.  The patient will  continue with home physical therapy program for general range of motion  of her hemiarthroplasty.  Diet is regular.  Dressing changes daily or an  as needed if the dressing becomes wet or soiled.  The patient may shower  postoperative day #7.  The patient will return to see Dr. [name] in  approximately 2 weeks' time for a followup  check.  She was given  prescriptions for Percocet and Robaxin to use for pain and spasm  respectively.  She should return sooner if she have any temperature  greater than 101, drainage from the wound, or pain not well controlled  by oral pain medications.      Laural Benes. Jannet Mantis.      Claude Manges. [name], M.D.  Electronically Signed    JBR/MEDQ  D:  05/02/2008  T:  05/03/2008  Job:  [id number]

## 2011-04-30 NOTE — H&P (Signed)
[name], [name]                        ACCOUNT NO.:  [id number]   MEDICAL RECORD NO.:  [id number]                   PATIENT TYPE:  INP   LOCATION:  3729                                 FACILITY:  [facility]   PHYSICIAN:  Burnice Logan, M.D.               DATE OF BIRTH:  [birth year]   DATE OF ADMISSION:  02/29/2004  DATE OF DISCHARGE:                                HISTORY & PHYSICAL   CHIEF COMPLAINT:  I passed out.   HISTORY OF PRESENT ILLNESS:  A pleasant, 77 year old lady who passed out  about 2 p.m. today while out shopping with her daughter in [address] [place].  She had  a dizzy spell preceding this episode.  She was in the changing room and  asked to sit down because she was feeling dizzy.  Her daughter noticed that  she was diaphoretic.  Initial reaction was that she may be having a seizure  because she saw her fingers contract.  She was briefly unresponsive to her  name.  She had to support her to prevent her from falling down.  She also  noted that her breathing was funny at that point.  She appeared to be  laboring at that point.  She seemed to come around as soon as she got help  to lay her on the floor.  The patient has very poor recall for these  episodes.  The sequence of events lasted approximately less than five  minutes.  They brought her to the hospital to be checked out.  The patient  was seen in the emergency room yesterday for preoperative evaluation and had  normal blood work.  She is scheduled to have cervical spine fusion surgery  by Dr. Venetia Maxon, her neurosurgeon, on Tuesday, March 03, 2004.  She apparently  has had chronic left shoulder pain for about three years.  She has had  radicular type pain going down her left arm.  She was evaluated by her  orthopedic surgeon who scheduled her for shoulder replacement surgery, but  prior to that she was sent to Dr. Venetia Maxon who ordered MRI.  MRI showed severe  degenerative disease of her neck with stenosis and compromise of the  central  canal.  She was scheduled to have neck fusion surgery prior to her having  her shoulder replacement surgery.   PAST MEDICAL HISTORY:  1. High blood pressure.  2. Hyperlipidemia.  3. Right knee replacement in 1996.  4. Hysteroscopy in 2001.  5. Colonoscopy showing left colon diverticulosis in 2004.  6. Seasonal allergies.  7. History is negative for any cardiac disease, diabetes mellitus or other     chronic medical conditions.   ALLERGIES:  No known drug allergies.   MEDICATIONS:  1. Aspirin 81 mg daily.  2. Lisinopril/HCTZ 10/25 daily.  3. Prempro 0.625 mg daily.  4. Lipitor 10 mg daily.  5. Clarinex 5 mg daily.  6. Flonase nasal spray.  7. Mobic 7.5 mg daily.  8. Unisom 1/2 tablet p.o. q.h.s.  9. Stool softener.   FAMILY HISTORY:  The patient's mother had hypertension and died from  complications of emphysema.  Father had Alzheimer's disease.   SOCIAL HISTORY:  The patient is married and retired.  She has approximately  30-pack-year history of smoking, but quit a long while ago.  She does not  drink alcohol.   REVIEW OF SYMPTOMS:  GENERAL:  Denies any fever, no weakness.  She had felt  fine until this afternoon's episode.  NEUROLOGIC:  Chronic dizzy spells and  has been treated for vestibular disease.  CARDIORESPIRATORY:  Denies any  chest pain or shortness of breath.  No cough or sputum.  GASTROINTESTINAL:  No abdominal pain.  Normal bowel habits.  GENITOURINARY:  No dysuria or  hematuria.  MUSCULOSKELETAL:  Pain of arthritis.  All other systems were  otherwise negative.   PHYSICAL EXAMINATION:  GENERAL:  Elderly, Caucasian lady, well-nourished,  average size in no acute distress.  VITAL SIGNS:  Temperature 99, blood pressure 180/98, heart rate 86,  respirations 20.  HEENT:  Normocephalic.  No facial asymmetry.  Pupils equal round and  reactive to light.  Normal external eye movements.  Eye lids, conjunctivae,  mucous membranes are normal.  Oropharynx  unremarkable.  Uvula midline.  Palate elevates symmetrically.  NECK:  Supple.  No JVD.  No carotid bruit.  No tenderness on palpation.  LUNGS:  Clear to auscultation.  CARDIAC:  S1, S2 heard regular with no murmurs, rubs or gallops.  ABDOMEN:  Soft, nontender, normal bowel sounds.  No organomegaly.  NEUROLOGIC:  She is alert and oriented x3.  Cranial nerves 2-12 grossly  normal.  Has good hand grips bilaterally.  Moves all limbs well.  No focal  deficits on exam.  EXTREMITIES:  No pedal edema.  Good peripheral pulses are felt.   LABORATORY DATA AND X-RAY FINDINGS:  White count 10.3, hemoglobin 13,  hematocrit 41, platelet count 346.  Cardiac markers within normal limits.   CT of head noncontrast done in emergency room was reported as negative to  me.  Radiology interpretation is pending.  EKG shows sinus rhythm with few  PAC's.  No acute ST segment changes.   ASSESSMENT/PLAN:  1. Syncope.  Rule out neurogenic cause.  Because of her severe degenerative     disease of the neck, I am concerned about possibility of vertebral     basilar insufficiency causing her symptoms.  The patient will be observed     on telemetry.  I will order magnetic resonance imaging/magnetic resonance     angiogram of the neck vessels.  A 2D echocardiogram will be done to rule     out any embolic source.  If her syncope workup is negative, the patient     may proceed with surgery as originally planned by Dr. Venetia Maxon.  Further     recommendations, however, are pending results of above workup.  2. Poorly controlled hypertension.  The patient has been compliant with her     Enalapril/hydrochlorothiazide.  Will order beta-blocker to her regimen     and try and get her blood pressure better controlled before she proceeds     with     surgery.  3. Hyperlipidemia.  Statin will be continued.  Will check a thyroid     stimulating hormone level as part of her workup.  Burnice Logan, M.D.    ES/MEDQ  D:  02/29/2004  T:  03/02/2004  Job:  [id number]   cc:   Danae Orleans. Venetia Maxon, M.D.  [address]  [place]  [place] [address]  Fax: [phone]

## 2011-04-30 NOTE — Op Note (Signed)
[facility]  Patient:    [name], [name]                     MRN: [id number] Proc. Date: 07/01/00 Adm. Date:  [id number] Disc. Date: [id number] Attending:  Jenean Lindau CC:         Talmadge Coventry, M.D.                           Operative Report  PREOPERATIVE DIAGNOSIS:  Postmenopausal bleeding due to endometrial polyps.  POSTOPERATIVE DIAGNOSIS:  Postmenopausal bleeding due to endometrial polyps.  PROCEDURE:  Hysteroscopic resection with dilatation and curettage.  SURGEON:  Laqueta Linden, M.D.  ANESTHESIA:  Mild sedation with paracervical block.  ESTIMATED BLOOD LOSS:  50 cc.  NET SORBITOL INTAKE 60 cc.  COMPLICATIONS:  Small cervical laceration due to tenaculum pulling off with spontaneous hemostasis.  CONDITION:  Stable to the recovery room.  INDICATIONS:  The patient is a 77 year old menopausal female with postmenopausal bleeding, on long-term continuous combined hormonal replacement therapy.  She underwent pelvic ultrasound with films yesterday, revealing an anterior endometrial polypoid lesion.  She is scheduled for hysteroscopic resection.  She has been counseled as to the risks, benefits, alternatives, and complications, and agrees to proceed.  DESCRIPTION OF PROCEDURE:  The patient was taken to the operating room and after proper identification and consent were ascertained, placed on the operating table in the supine position.  After IV sedation was accomplished, she was placed in the [place] stirrups.  The perineum and vagina were prepped and draped in the routine sterile fashion.  A transurethral Foley was placed which was removed at the completion of the procedure.  Bimanual examination confirmed an anterior mobile normal size uterus.  A speculum was placed in the vagina and a single tooth tenaculum placed on the anterior lip of the cervix. A paracervical block utilizing 10 cc of 1% plain Xylocaine was then  placed circumferentially.  The internal os was difficult to dilate beyond the 21 Pratt dilator with some cervical stenosis encountered.  The tenaculum pulled off a couple of times, and was then replaced by a double tooth tenaculum with improved traction.  The cervix was gradually dilated to a #33 Pratt dilator.  At this point, the hysteroscope with video and continuous Sorbitol infusion was then inserted.  The endocervical canal was free of lesions.  The endometrium was filled with a large polypoid lesion which appeared to be emanating from the posterior fundus.  Both cornual regions were visualized. The remainder of the endometrial cavity appeared atrophic with some calcifications visualization.  Resection of the polyp was then accomplished with multiple passes of the resectoscope on routine setting.  Hemostasis was noted to be excellent.  Sharp curettage was then performed with additional small pieces of tissue removed. The hysteroscope was then reinserted.  The endometrial cavity appeared clean with no residual polypoid masses or other masses identified.  The hysteroscope was then removed.  The tenaculum was removed.  The place where the tenaculum had pulled off, there was pressure applied with a Raytec sponge to the place where the tenaculum had pulled off.  At this time, it was noted to have obtained hemostasis spontaneously with no active bleeding noted, so no sutures were required.  All instruments were removed.  Sorbitol intake 60 cc.  Total estimated blood loss was approximately 50 cc.  Complications as noted above.  Specimens were sent to pathology.  The patient was stable on transfer to the recovery room.  She will be observed and discharged per anesthesia.  She is to follow up in the office in four to six weeks time.  She is to continue on her current medications including her hormone and take Advil or Aleve as needed for cramping.  She is to call prior to her follow up  for extensive pain, fever, bleeding, or other concerns. DD:  07/01/00 TD:  07/04/00 Job: [id number]

## 2011-07-08 ENCOUNTER — Encounter: Payer: Self-pay | Admitting: Podiatry

## 2011-09-01 ENCOUNTER — Other Ambulatory Visit: Payer: Self-pay | Admitting: Orthopedic Surgery

## 2011-09-01 ENCOUNTER — Encounter (HOSPITAL_BASED_OUTPATIENT_CLINIC_OR_DEPARTMENT_OTHER)
Admission: RE | Admit: 2011-09-01 | Discharge: 2011-09-01 | Disposition: A | Discharge status: Home / Self Care | Payer: Medicare Other | Source: Ambulatory Visit | Attending: Orthopedic Surgery | Admitting: Orthopedic Surgery

## 2011-09-01 ENCOUNTER — Ambulatory Visit
Admission: RE | Admit: 2011-09-01 | Discharge: 2011-09-01 | Disposition: A | Discharge status: Home / Self Care | Payer: Medicare Other | Source: Ambulatory Visit | Attending: Orthopedic Surgery | Admitting: Orthopedic Surgery

## 2011-09-01 DIAGNOSIS — Z01811 Encounter for preprocedural respiratory examination: Secondary | ICD-10-CM

## 2011-09-01 LAB — COMPREHENSIVE METABOLIC PANEL
ALT: 12 U/L (ref 0–35)
Alkaline Phosphatase: 43 U/L (ref 39–117)
BUN: 16 mg/dL (ref 6–23)
CO2: 30 mEq/L (ref 19–32)
Calcium: 9.9 mg/dL (ref 8.4–10.5)
GFR calc Af Amer: >60 mL/min (ref >60)
GFR calc non Af Amer: >60 mL/min (ref >60)
Glucose, Bld: 94 mg/dL (ref 70–99)
Sodium: 140 mEq/L (ref 135–145)

## 2011-09-02 ENCOUNTER — Ambulatory Visit (HOSPITAL_BASED_OUTPATIENT_CLINIC_OR_DEPARTMENT_OTHER)
Admission: RE | Admit: 2011-09-02 | Discharge: 2011-09-02 | Disposition: A | Discharge status: Home / Self Care | Payer: Medicare Other | Source: Ambulatory Visit | Attending: Orthopedic Surgery | Admitting: Orthopedic Surgery

## 2011-09-02 DIAGNOSIS — G5600 Carpal tunnel syndrome, unspecified upper limb: Secondary | ICD-10-CM | POA: Insufficient documentation

## 2011-09-02 DIAGNOSIS — Z01818 Encounter for other preprocedural examination: Secondary | ICD-10-CM | POA: Insufficient documentation

## 2011-09-02 DIAGNOSIS — I10 Essential (primary) hypertension: Secondary | ICD-10-CM | POA: Insufficient documentation

## 2011-09-02 DIAGNOSIS — Z8673 Personal history of transient ischemic attack (TIA), and cerebral infarction without residual deficits: Secondary | ICD-10-CM | POA: Insufficient documentation

## 2011-09-02 DIAGNOSIS — J4489 Other specified chronic obstructive pulmonary disease: Secondary | ICD-10-CM | POA: Insufficient documentation

## 2011-09-02 DIAGNOSIS — M069 Rheumatoid arthritis, unspecified: Secondary | ICD-10-CM | POA: Insufficient documentation

## 2011-09-02 DIAGNOSIS — M65839 Other synovitis and tenosynovitis, unspecified forearm: Secondary | ICD-10-CM | POA: Insufficient documentation

## 2011-09-02 DIAGNOSIS — J449 Chronic obstructive pulmonary disease, unspecified: Secondary | ICD-10-CM | POA: Insufficient documentation

## 2011-09-02 DIAGNOSIS — M6530 Trigger finger, unspecified finger: Secondary | ICD-10-CM | POA: Insufficient documentation

## 2011-09-02 DIAGNOSIS — M65849 Other synovitis and tenosynovitis, unspecified hand: Secondary | ICD-10-CM | POA: Insufficient documentation

## 2011-09-02 DIAGNOSIS — Z01812 Encounter for preprocedural laboratory examination: Secondary | ICD-10-CM | POA: Insufficient documentation

## 2011-09-02 DIAGNOSIS — Z87891 Personal history of nicotine dependence: Secondary | ICD-10-CM | POA: Insufficient documentation

## 2011-09-02 LAB — DIFFERENTIAL
Basophils Absolute: 0.1 10*3/uL (ref 0.0–0.1)
Eosinophils Absolute: 0.4 10*3/uL (ref 0.0–0.7)
Eosinophils Relative: 5 % (ref 0–5)
Lymphocytes Relative: 19 % (ref 12–46)
Neutrophils Relative %: 65 % (ref 43–77)

## 2011-09-02 LAB — CBC
Platelets: 254 10*3/uL (ref 150–400)
RBC: 4.49 MIL/uL (ref 3.87–5.11)
RDW: 12.4 % (ref 11.5–15.5)
WBC: 8.4 10*3/uL (ref 4.0–10.5)

## 2011-09-03 NOTE — Op Note (Signed)
[name], [name]              ACCOUNT NO.:  [id number]  MEDICAL RECORD NO.:  [id number]  LOCATION:                                 FACILITY:  PHYSICIAN:  Katherine Fitch. Susano Cleckler, M.D.      DATE OF BIRTH:  DATE OF PROCEDURE:  09/02/2011 DATE OF DISCHARGE:                              OPERATIVE REPORT   PREOPERATIVE DIAGNOSES: 1. Chronic left carpal tunnel syndrome. 2. New-onset stenosing tenosynovitis left index finger with locking at     A1 pulley with background history of rheumatoid arthritis.  POSTOPERATIVE DIAGNOSES: 1. Chronic left carpal tunnel syndrome. 2. New-onset stenosing tenosynovitis left index finger with locking at     A1 pulley with background history of rheumatoid arthritis.  OPERATION: 1. Release of left transverse carpal ligament. 2. Release of left index finger A1 pulley and synovectomy of     superficialis and profundus tendons.  OPERATING SURGEON:  Katherine Fitch. Gilberta Peeters, MD  ASSISTANT:  Annye Rusk, PA-CANESTHESIA:  General by LMA.  SUPERVISING ANESTHESIOLOGIST:  Germaine Pomfret, MD  INDICATIONS:  [name] is an 77 year old woman referred for evaluation and management of significant hand numbness and triggering of left index finger.  She has background history of rheumatoid arthritis.  She was admitted by Dr. Pollyann Savoy.  Due to failure to respond to nonoperative measures, she is referred at this time for release of her left transverse carpal ligament.  Clinical examination in the holding area revealed that she also had significant triggering of left index finger with A1 pulley with palpable tenosynovitis.  After informed consent, she is brought to the operating room at this time anticipating release of her left transverse carpal ligament and release of the left index finger A1 pulley with synovectomy as needed.  PROCEDURE:  [name] was brought to room 2 of the [facility] and placed in supine position on the  operating table.  Under Dr. Edison Pace direct supervision, general anesthesia by LMA technique was induced.  The left arm was then prepped with Betadine soap and solution and sterilely draped.  A pneumatic tourniquet was applied to proximal left brachium.  Following routine surgical time-out, the left arm was exsanguinated with an Esmarch bandage and arterial tourniquet inflated to 220 mmHg. Procedure commenced with a short incision line of the ring finger and the palm.  Subcutaneous tissues were carefully divided revealing the palmar fascia.  This split longitudinally to reveal the common sensory branch of the median nerve and superficial palmar arch.  The transverse carpal ligament was sounded with a Penfield 4 elevator followed by separation of median nerve from the ligament.  Ligament was then released along its ulnar border extending into the distal forearm.  This widely opened carpal canal.  No mass or other predicaments were noted. Bleeding points were electrocauterized with bipolar current followed by repair of the skin with intradermal 3-0 Prolene suture.  Attention was then directed to the index finger.  The palpably thickened A1 pulley was noted.  An oblique incision was fashioned directly over the pulley.  The subcutaneous tissues were carefully divided, taking care to retract the neurovascular bundles.  The A1 pulley was split with scalpel and scissors.  The tendons were delivered.  There was a thick cuff of tenosynovium primarily on superficialis, some also on the profundus.  This was removed with micro rongeur.  Thereafter, free range of motion of fingers was recovered.  [name]'s index finger wound was then repaired with intradermal 3-0 Prolene suture.  Steri-Strips were applied followed by infiltration of 2% lidocaine for postoperative analgesia.  She was placed in compressive dressing with volar plaster splint.     [name], M.D.     RVS/MEDQ  D:   09/02/2011  T:  09/02/2011  Job:  [id number]  cc:   Pollyann Savoy, M.D. Della Goo, M.D.  Electronically Signed by Josephine Igo M.D. on 09/03/2011 11:43:42 AM

## 2011-09-07 LAB — COMPREHENSIVE METABOLIC PANEL
ALT: 17
AST: 24
Alkaline Phosphatase: 66
CO2: 28
Calcium: 9.1
Chloride: 102
GFR calc Af Amer: >60
GFR calc non Af Amer: >60
Potassium: 4.4
Sodium: 137
Total Bilirubin: 0.3

## 2011-09-07 LAB — DIFFERENTIAL
Eosinophils Absolute: 0.3
Eosinophils Relative: 4
Lymphs Abs: 2

## 2011-09-07 LAB — TYPE AND SCREEN

## 2011-09-07 LAB — PROTIME-INR: Prothrombin Time: 13.2

## 2011-09-07 LAB — URINALYSIS, ROUTINE W REFLEX MICROSCOPIC
Glucose, UA: NEGATIVE
Hgb urine dipstick: NEGATIVE
Protein, ur: NEGATIVE

## 2011-09-07 LAB — URINE CULTURE

## 2011-09-07 LAB — CBC
RBC: 4.2
WBC: 8.4

## 2012-02-24 ENCOUNTER — Ambulatory Visit
Admission: RE | Admit: 2012-02-24 | Discharge: 2012-02-24 | Disposition: A | Discharge status: Home / Self Care | Payer: Medicare Other | Source: Ambulatory Visit | Attending: Orthopedic Surgery | Admitting: Orthopedic Surgery

## 2012-02-24 ENCOUNTER — Other Ambulatory Visit: Payer: Self-pay | Admitting: Orthopedic Surgery

## 2012-02-24 DIAGNOSIS — M24819 Other specific joint derangements of unspecified shoulder, not elsewhere classified: Secondary | ICD-10-CM

## 2012-02-24 DIAGNOSIS — M25511 Pain in right shoulder: Secondary | ICD-10-CM

## 2013-07-30 ENCOUNTER — Other Ambulatory Visit: Payer: Self-pay

## 2014-11-03 ENCOUNTER — Encounter (HOSPITAL_COMMUNITY): Payer: Self-pay

## 2014-11-03 ENCOUNTER — Emergency Department (HOSPITAL_COMMUNITY): Payer: Medicare Other

## 2014-11-03 ENCOUNTER — Observation Stay (HOSPITAL_COMMUNITY)
Admission: EM | Admit: 2014-11-03 | Discharge: 2014-11-04 | Disposition: A | Discharge status: Home / Self Care | Payer: Medicare Other | Attending: Internal Medicine | Admitting: Internal Medicine

## 2014-11-03 DIAGNOSIS — M069 Rheumatoid arthritis, unspecified: Secondary | ICD-10-CM | POA: Diagnosis not present

## 2014-11-03 DIAGNOSIS — Z7982 Long term (current) use of aspirin: Secondary | ICD-10-CM | POA: Diagnosis not present

## 2014-11-03 DIAGNOSIS — Z791 Long term (current) use of non-steroidal anti-inflammatories (NSAID): Secondary | ICD-10-CM | POA: Diagnosis not present

## 2014-11-03 DIAGNOSIS — R55 Syncope and collapse: Principal | ICD-10-CM | POA: Diagnosis present

## 2014-11-03 DIAGNOSIS — Z87891 Personal history of nicotine dependence: Secondary | ICD-10-CM | POA: Insufficient documentation

## 2014-11-03 DIAGNOSIS — I10 Essential (primary) hypertension: Secondary | ICD-10-CM | POA: Diagnosis present

## 2014-11-03 DIAGNOSIS — R4020 Unspecified coma: Secondary | ICD-10-CM | POA: Diagnosis present

## 2014-11-03 DIAGNOSIS — Z79899 Other long term (current) drug therapy: Secondary | ICD-10-CM | POA: Diagnosis not present

## 2014-11-03 LAB — COMPREHENSIVE METABOLIC PANEL
ALK PHOS: 44 U/L (ref 39–117)
ALT: 11 U/L (ref 0–35)
AST: 19 U/L (ref 0–37)
Albumin: 3.5 g/dL (ref 3.5–5.2)
Anion gap: 17 — ABNORMAL HIGH (ref 5–15)
BUN: 15 mg/dL (ref 6–23)
CALCIUM: 9.8 mg/dL (ref 8.4–10.5)
CHLORIDE: 96 meq/L (ref 96–112)
CO2: 23 meq/L (ref 19–32)
Creatinine, Ser: 0.84 mg/dL (ref 0.50–1.10)
GFR calc Af Amer: 72 mL/min — ABNORMAL LOW (ref >90)
GFR, EST NON AFRICAN AMERICAN: 62 mL/min — AB (ref >90)
GLUCOSE: 106 mg/dL — AB (ref 70–99)
POTASSIUM: 3.8 meq/L (ref 3.7–5.3)
SODIUM: 136 meq/L — AB (ref 137–147)
Total Bilirubin: 0.6 mg/dL (ref 0.3–1.2)
Total Protein: 6.2 g/dL (ref 6.0–8.3)

## 2014-11-03 LAB — URINALYSIS, ROUTINE W REFLEX MICROSCOPIC
Bilirubin Urine: NEGATIVE
Glucose, UA: NEGATIVE mg/dL
Hgb urine dipstick: NEGATIVE
KETONES UR: 15 mg/dL — AB
NITRITE: NEGATIVE
PH: 6.5 (ref 5.0–8.0)
Protein, ur: NEGATIVE mg/dL
SPECIFIC GRAVITY, URINE: 1.016 (ref 1.005–1.030)
Urobilinogen, UA: 0.2 mg/dL (ref 0.0–1.0)

## 2014-11-03 LAB — CBC WITH DIFFERENTIAL/PLATELET
BASOS ABS: 0 10*3/uL (ref 0.0–0.1)
Basophils Relative: 0 % (ref 0–1)
Eosinophils Absolute: 0.1 10*3/uL (ref 0.0–0.7)
Eosinophils Relative: 2 % (ref 0–5)
HCT: 42.4 % (ref 36.0–46.0)
Hemoglobin: 14.2 g/dL (ref 12.0–15.0)
LYMPHS ABS: 0.9 10*3/uL (ref 0.7–4.0)
LYMPHS PCT: 11 % — AB (ref 12–46)
MCH: 31.8 pg (ref 26.0–34.0)
MCHC: 33.5 g/dL (ref 30.0–36.0)
MCV: 95.1 fL (ref 78.0–100.0)
Monocytes Absolute: 0.8 10*3/uL (ref 0.1–1.0)
Monocytes Relative: 9 % (ref 3–12)
NEUTROS ABS: 6.3 10*3/uL (ref 1.7–7.7)
NEUTROS PCT: 78 % — AB (ref 43–77)
PLATELETS: 222 10*3/uL (ref 150–400)
RBC: 4.46 MIL/uL (ref 3.87–5.11)
RDW: 12.1 % (ref 11.5–15.5)
WBC: 8.1 10*3/uL (ref 4.0–10.5)

## 2014-11-03 LAB — CBG MONITORING, ED: GLUCOSE-CAPILLARY: 113 mg/dL — AB (ref 70–99)

## 2014-11-03 LAB — TYPE AND SCREEN
ABO/RH(D): B POS
Antibody Screen: NEGATIVE

## 2014-11-03 LAB — FOLATE: Folate: 16 ng/mL

## 2014-11-03 LAB — PROTIME-INR
INR: 1.02 (ref 0.00–1.49)
Prothrombin Time: 13.5 seconds (ref 11.6–15.2)

## 2014-11-03 LAB — I-STAT TROPONIN, ED: Troponin i, poc: 0 ng/mL (ref 0.00–0.08)

## 2014-11-03 LAB — URINE MICROSCOPIC-ADD ON

## 2014-11-03 LAB — TROPONIN I: Troponin I: <0.3 ng/mL (ref <0.30)

## 2014-11-03 LAB — CORTISOL: Cortisol, Plasma: 8.1 ug/dL

## 2014-11-03 LAB — VITAMIN B12: Vitamin B-12: 404 pg/mL (ref 211–911)

## 2014-11-03 MED ORDER — ONDANSETRON HCL 4 MG/2ML IJ SOLN: 4.0000 mg | Freq: Four times a day (QID) | INTRAMUSCULAR | Status: DC | PRN

## 2014-11-03 MED ORDER — SODIUM CHLORIDE 0.9 % IV SOLN: INTRAVENOUS | Status: DC

## 2014-11-03 MED ORDER — ASPIRIN 81 MG PO TABS: 81.0000 mg | ORAL_TABLET | Freq: Every day | ORAL | Status: DC

## 2014-11-03 MED ORDER — CALCIUM CARBONATE 1250 (500 CA) MG PO TABS
1.0000 | ORAL_TABLET | Freq: Two times a day (BID) | ORAL | Status: DC
  Administered 2014-11-03 – 2014-11-04 (×2): 500 mg via ORAL
  Filled 2014-11-03 (×2): qty 1

## 2014-11-03 MED ORDER — CALCIUM CARBONATE 600 MG PO TABS: 600.0000 mg | ORAL_TABLET | Freq: Two times a day (BID) | ORAL | Status: DC

## 2014-11-03 MED ORDER — ONDANSETRON HCL 4 MG PO TABS: 4.0000 mg | ORAL_TABLET | Freq: Four times a day (QID) | ORAL | Status: DC | PRN

## 2014-11-03 MED ORDER — ENOXAPARIN SODIUM 40 MG/0.4ML ~~LOC~~ SOLN
40.0000 mg | SUBCUTANEOUS | Status: DC
  Administered 2014-11-03: 40 mg via SUBCUTANEOUS
  Filled 2014-11-03: qty 0.4

## 2014-11-03 MED ORDER — PREDNISONE 5 MG PO TABS
5.0000 mg | ORAL_TABLET | Freq: Every day | ORAL | Status: DC
  Administered 2014-11-04: 5 mg via ORAL
  Filled 2014-11-03: qty 1

## 2014-11-03 MED ORDER — ACETAMINOPHEN 650 MG RE SUPP: 650.0000 mg | Freq: Four times a day (QID) | RECTAL | Status: DC | PRN

## 2014-11-03 MED ORDER — METOPROLOL SUCCINATE ER 50 MG PO TB24
50.0000 mg | ORAL_TABLET | Freq: Two times a day (BID) | ORAL | Status: DC
Start: 2014-11-03 — End: 2014-11-04
  Administered 2014-11-03 – 2014-11-04 (×2): 50 mg via ORAL
  Filled 2014-11-03 (×2): qty 1

## 2014-11-03 MED ORDER — HYDROMORPHONE HCL 1 MG/ML IJ SOLN: 1.0000 mg | INTRAMUSCULAR | Status: DC | PRN

## 2014-11-03 MED ORDER — ACETAMINOPHEN 325 MG PO TABS: 650.0000 mg | ORAL_TABLET | Freq: Four times a day (QID) | ORAL | Status: DC | PRN

## 2014-11-03 MED ORDER — VITAMIN D 1000 UNITS PO TABS
1000.0000 [IU] | ORAL_TABLET | Freq: Two times a day (BID) | ORAL | Status: DC
  Administered 2014-11-03 – 2014-11-04 (×2): 1000 [IU] via ORAL
  Filled 2014-11-03 (×2): qty 1

## 2014-11-03 MED ORDER — MAGNESIUM 250 MG PO TABS: 250.0000 mg | ORAL_TABLET | Freq: Every day | ORAL | Status: DC

## 2014-11-03 MED ORDER — AMLODIPINE BESYLATE 10 MG PO TABS
10.0000 mg | ORAL_TABLET | Freq: Every day | ORAL | Status: DC
  Administered 2014-11-03 – 2014-11-04 (×2): 10 mg via ORAL
  Filled 2014-11-03 (×2): qty 1

## 2014-11-03 MED ORDER — MAGNESIUM OXIDE 400 (241.3 MG) MG PO TABS: 200.0000 mg | ORAL_TABLET | Freq: Every day | ORAL | Status: DC

## 2014-11-03 MED ORDER — ALUM & MAG HYDROXIDE-SIMETH 200-200-20 MG/5ML PO SUSP: 30.0000 mL | Freq: Four times a day (QID) | ORAL | Status: DC | PRN

## 2014-11-03 MED ORDER — HYDROCODONE-ACETAMINOPHEN 5-325 MG PO TABS: 1.0000 | ORAL_TABLET | Freq: Four times a day (QID) | ORAL | Status: DC | PRN

## 2014-11-03 MED ORDER — LORATADINE 10 MG PO TABS
10.0000 mg | ORAL_TABLET | Freq: Every day | ORAL | Status: DC
  Administered 2014-11-04: 10 mg via ORAL
  Filled 2014-11-03: qty 1

## 2014-11-03 MED ORDER — ENALAPRIL MALEATE 5 MG PO TABS
20.0000 mg | ORAL_TABLET | Freq: Two times a day (BID) | ORAL | Status: DC
  Administered 2014-11-03 – 2014-11-04 (×2): 20 mg via ORAL
  Filled 2014-11-03: qty 4
  Filled 2014-11-03: qty 8

## 2014-11-03 MED ORDER — SODIUM CHLORIDE 0.9 % IV SOLN
1000.0000 mL | INTRAVENOUS | Status: DC
  Administered 2014-11-03: 1000 mL via INTRAVENOUS

## 2014-11-03 MED ORDER — ASPIRIN 81 MG PO CHEW
81.0000 mg | CHEWABLE_TABLET | Freq: Every day | ORAL | Status: DC
  Administered 2014-11-03 – 2014-11-04 (×2): 81 mg via ORAL
  Filled 2014-11-03 (×2): qty 1

## 2014-11-03 MED ORDER — VITAMIN C 500 MG PO TABS
500.0000 mg | ORAL_TABLET | Freq: Every day | ORAL | Status: DC
  Administered 2014-11-04: 500 mg via ORAL
  Filled 2014-11-03: qty 1

## 2014-11-03 NOTE — H&P (Signed)
History and Physical       Hospital Admission Note Date: 11/03/2014  Patient name: [name] Medical record number: [id number] Date of birth: 08/25/1928 Age: 80 y.o. Gender: female  PCP: No primary care provider on file.    Chief Complaint:  Syncopal episode  HPI: Patient is a 80 year old female with hypertension, rheumatoid arthritis, vertigo presented to ED with syncopal episode. History was obtained from the patient and the daughter present in the room. Patient reported that she was in the church for at least 45 minutes-1 hour when she felt dizzy and the lights dimming, subsequently passed out. Daughter present in the room reported that she was unconscious for less than a minute, did not have any seizure episode, urinary or bowel incontinence. Patient reported no focal weakness, numbness or tingling or slurred speech.  Patient has had 2 prior episodes of syncope in the past, first was 15 years ago, no cause was ever determined. Patient did report that she has been going through significant stress with packing, cleaning of the house as she is moving to [facility] [place] on December 16th.    Review of Systems:  Constitutional: Denies fever, chills, diaphoresis, poor appetite and fatigue.  HEENT: Denies photophobia, eye pain, redness, hearing loss, ear pain, congestion, sore throat, rhinorrhea, sneezing, mouth sores, trouble swallowing, neck pain, neck stiffness and tinnitus.   Respiratory: Denies SOB, DOE, cough, chest tightness,  and wheezing.   Cardiovascular: Denies chest pain, palpitations and leg swelling.  Gastrointestinal: Denies nausea, vomiting, abdominal pain, diarrhea, constipation, blood in stool and abdominal distention.  Genitourinary: Denies dysuria, urgency, frequency, hematuria, flank pain and difficulty urinating.  Musculoskeletal: Denies myalgias, back pain, joint swelling, arthralgias and gait problem.  Skin:  Denies pallor, rash and wound.  Neurological: please see HPI Hematological: Denies adenopathy. Easy bruising, personal or family bleeding history  Psychiatric/Behavioral: Denies suicidal ideation, mood changes, confusion, nervousness, sleep disturbance and agitation  Past Medical History: Past Medical History  Diagnosis Date  . Hypertension   . Vertigo   . RA (rheumatoid arthritis)    Past Surgical History  Procedure Laterality Date  . Replacement total knee Right   . Shoulder surgery Bilateral     Medications: Prior to Admission medications   Medication Sig Start Date End Date Taking? Authorizing Provider  amLODipine (NORVASC) 10 MG tablet Take 10 mg by mouth daily.     Yes Historical Provider, MD  aspirin 81 MG tablet Take 81 mg by mouth daily.   Yes Historical Provider, MD  calcium carbonate (OS-CAL) 600 MG TABS Take 600 mg by mouth 2 (two) times daily with a meal.     Yes Historical Provider, MD  cholecalciferol (VITAMIN D) 1000 UNITS tablet Take 1,000 Units by mouth 2 (two) times daily.    Yes Historical Provider, MD  enalapril (VASOTEC) 20 MG tablet Take 20 mg by mouth 2 (two) times daily.     Yes Historical Provider, MD  loratadine (CLARITIN) 10 MG tablet Take 10 mg by mouth daily.   Yes Historical Provider, MD  Magnesium 250 MG TABS Take 250 mg by mouth daily.     Yes Historical Provider, MD  metoprolol (TOPROL-XL) 50 MG 24 hr tablet Take 50 mg by mouth 2 (two) times daily.     Yes Historical Provider, MD  Omega-3 Fatty Acids (FISH OIL) 1000 MG CAPS Take by mouth.     Yes Historical Provider, MD  predniSONE (DELTASONE) 5 MG tablet Take 5 mg by mouth daily with  breakfast.   Yes Historical Provider, MD  vitamin C (ASCORBIC ACID) 500 MG tablet Take 500 mg by mouth daily.     Yes Historical Provider, MD  hydroxychloroquine (PLAQUENIL) 200 MG tablet Take 200 mg by mouth 2 (two) times daily.      Historical Provider, MD  meloxicam (MOBIC) 7.5 MG tablet Take 7.5 mg by mouth daily.       Historical Provider, MD  traMADol (ULTRAM) 50 MG tablet Take 25 mg by mouth every 8 (eight) hours as needed.      Historical Provider, MD    Allergies:  No Known Allergies  Social History:  reports that she has quit smoking. She does not have any smokeless tobacco history on file. She reports that she does not drink alcohol or use illicit drugs.  Family History: History reviewed. No pertinent family history.  Physical Exam: Blood pressure 146/77, pulse 62, resp. rate 20, height 5\' 3"  (1.6 m), weight 68.04 kg (150 lb), SpO2 97 %. General: Alert, awake, oriented x3, in no acute distress. HEENT: normocephalic, atraumatic, anicteric sclera, pink conjunctiva, pupils equal and reactive to light and accomodation, oropharynx clear Neck: supple, no masses or lymphadenopathy, no goiter, no bruits  Heart: Regular rate and rhythm, without murmurs, rubs or gallops. Lungs: Clear to auscultation bilaterally, no wheezing, rales or rhonchi. Abdomen: Soft, nontender, nondistended, positive bowel sounds, no masses. Extremities: No clubbing, cyanosis or edema with positive pedal pulses. Neuro: Grossly intact, no focal neurological deficits, strength 5/5 upper and lower extremities bilaterally Psych: alert and oriented x 3, normal mood and affect Skin: no rashes or lesions, warm and dry   LABS on Admission:  Basic Metabolic Panel:  Recent Labs Lab 11/03/14 1254  NA 136*  K 3.8  CL 96  CO2 23  GLUCOSE 106*  BUN 15  CREATININE 0.84  CALCIUM 9.8   Liver Function Tests:  Recent Labs Lab 11/03/14 1254  AST 19  ALT 11  ALKPHOS 44  BILITOT 0.6  PROT 6.2  ALBUMIN 3.5   No results for input(s): LIPASE, AMYLASE in the last 168 hours. No results for input(s): AMMONIA in the last 168 hours. CBC:  Recent Labs Lab 11/03/14 1254  WBC 8.1  NEUTROABS 6.3  HGB 14.2  HCT 42.4  MCV 95.1  PLT 222   Cardiac Enzymes: No results for input(s): CKTOTAL, CKMB, CKMBINDEX, TROPONINI in the last  168 hours. BNP: Invalid input(s): POCBNP CBG:  Recent Labs Lab 11/03/14 1254  GLUCAP 113*     Radiological Exams on Admission: Dg Chest 2 View  11/03/2014   CLINICAL DATA:  80 year old female with syncope at church. Initial encounter.  EXAM: CHEST  2 VIEW  COMPARISON:  09/01/2011.  FINDINGS: AP and lateral views of the chest. Stable lung volumes. Stable cardiac size and mediastinal contours. Calcified atherosclerosis of the thoracic aorta. No pneumothorax, pulmonary edema, pleural effusion or acute pulmonary opacity. Chronic curvilinear markings at the left lung base compatible with scarring. Chronic bilateral shoulder arthroplasties. Chronic right lateral rib fractures. Thoracolumbar junction scoliosis. No acute osseous abnormality identified.  IMPRESSION: No acute cardiopulmonary abnormality.   Electronically Signed   By: Lars Pinks M.D.   On: 11/03/2014 14:40    Assessment/Plan Principal Problem:   Syncope; likely vasovagal, EKG showed rate 54, PVCs but no acute ST-T wave changes just above any ischemia. No seizure activity or focal weakness reported - Admitted for observation, check orthostatics, serial cardiac enzymes  -  2-D echo for further workup, check cortisol,  B12 folate  Active Problems:   Essential hypertension: BP somewhat elevated - Restart amlodipine, Vasotec, Toprol-XL  Rheumatoid arthritis - Continue prednisone  Allergies - Continue Claritin   DVT prophylaxis: Lovenox   CODE STATUS: Full code   Family Communication: Admission, patients condition and plan of care including tests being ordered have been discussed with the patient and daughter who indicates understanding and agree with the plan and Code Status   Further plan will depend as patient's clinical course evolves and further radiologic and laboratory data become available.   Time Spent on Admission: 1 hour  RAI,RIPUDEEP M.D. [facility] 11/03/2014, 4:01 PM Pager: [phone]  If 7PM-7AM,  please contact night-coverage [email or web address] Password [email or web address]

## 2014-11-03 NOTE — ED Provider Notes (Signed)
CSN: [id number]     Arrival date & time 11/03/14  1155 History   First MD Initiated Contact with Patient 11/03/14 1204     Chief Complaint  Patient presents with  . Loss of Consciousness   HPI The patient presents to the emergency room after a syncopal episode. The patient was sitting in church when she started to feel lightheaded. She felt like her vision was starting to gray out. Next thing she remembers was her daughter asking her how she was. The patient thinks she may have lost consciousness for a few seconds. 911 was called and the patient was brought to the emergency department. The patient does not recall having any trouble with slurred speech. She had no unilateral weakness. She had no trouble with her memory. There is no shaking observed. Patient has had 2 prior episodes of syncope in the past but these were many years ago. Patient members being admitted to the hospital having an extensive evaluation. However, they were never able to determine the cause. Past Medical History  Diagnosis Date  . Hypertension   . Vertigo   . RA (rheumatoid arthritis)    Past Surgical History  Procedure Laterality Date  . Replacement total knee Right   . Shoulder surgery Bilateral    History reviewed. No pertinent family history. History  Substance Use Topics  . Smoking status: Former Research scientist (life sciences)  . Smokeless tobacco: Not on file  . Alcohol Use: No   OB History    No data available     Review of Systems  All other systems reviewed and are negative.     Allergies  Review of patient's allergies indicates no known allergies.  Home Medications   Prior to Admission medications   Medication Sig Start Date End Date Taking? Authorizing Provider  amLODipine (NORVASC) 10 MG tablet Take 10 mg by mouth daily.     Yes Historical Provider, MD  aspirin 81 MG tablet Take 81 mg by mouth daily.   Yes Historical Provider, MD  calcium carbonate (OS-CAL) 600 MG TABS Take 600 mg by mouth 2 (two) times daily  with a meal.     Yes Historical Provider, MD  cholecalciferol (VITAMIN D) 1000 UNITS tablet Take 1,000 Units by mouth 2 (two) times daily.    Yes Historical Provider, MD  enalapril (VASOTEC) 20 MG tablet Take 20 mg by mouth 2 (two) times daily.     Yes Historical Provider, MD  loratadine (CLARITIN) 10 MG tablet Take 10 mg by mouth daily.   Yes Historical Provider, MD  Magnesium 250 MG TABS Take 250 mg by mouth daily.     Yes Historical Provider, MD  metoprolol (TOPROL-XL) 50 MG 24 hr tablet Take 50 mg by mouth 2 (two) times daily.     Yes Historical Provider, MD  Omega-3 Fatty Acids (FISH OIL) 1000 MG CAPS Take by mouth.     Yes Historical Provider, MD  predniSONE (DELTASONE) 5 MG tablet Take 5 mg by mouth daily with breakfast.   Yes Historical Provider, MD  vitamin C (ASCORBIC ACID) 500 MG tablet Take 500 mg by mouth daily.     Yes Historical Provider, MD  hydroxychloroquine (PLAQUENIL) 200 MG tablet Take 200 mg by mouth 2 (two) times daily.      Historical Provider, MD  meloxicam (MOBIC) 7.5 MG tablet Take 7.5 mg by mouth daily.      Historical Provider, MD  traMADol (ULTRAM) 50 MG tablet Take 25 mg by mouth every 8 (eight)  hours as needed.      Historical Provider, MD   BP 162/74 mmHg  Pulse 71  Resp 15  Ht 5\' 3"  (1.6 m)  Wt 150 lb (68.04 kg)  BMI 26.58 kg/m2  SpO2 100% Physical Exam  Constitutional: She is oriented to person, place, and time. She appears well-developed and well-nourished. No distress.  HENT:  Head: Normocephalic and atraumatic.  Right Ear: External ear normal.  Left Ear: External ear normal.  Mouth/Throat: Oropharynx is clear and moist.  Eyes: Conjunctivae are normal. Right eye exhibits no discharge. Left eye exhibits no discharge. No scleral icterus.  Neck: Neck supple. No tracheal deviation present.  Cardiovascular: Normal rate, regular rhythm and intact distal pulses.   Pulmonary/Chest: Effort normal and breath sounds normal. No stridor. No respiratory distress.  She has no wheezes. She has no rales.  Abdominal: Soft. Bowel sounds are normal. She exhibits no distension. There is no tenderness. There is no rebound and no guarding.  Musculoskeletal: She exhibits no edema or tenderness.  Neurological: She is alert and oriented to person, place, and time. She has normal strength. No cranial nerve deficit (No facial droop, extraocular movements intact, tongue midline ) or sensory deficit. She exhibits normal muscle tone. She displays no seizure activity. Coordination normal.  No pronator drift bilateral upper extrem, able to hold both legs off bed for 5 seconds, sensation intact in all extremities, no visual field cuts, no left or right sided neglect, normal finger-nose exam bilaterally, no nystagmus noted   Skin: Skin is warm and dry. No rash noted.  Psychiatric: She has a normal mood and affect.  Nursing note and vitals reviewed.   ED Course  Procedures (including critical care time) Labs Review Labs Reviewed  CBC WITH DIFFERENTIAL - Abnormal; Notable for the following:    Neutrophils Relative % 78 (*)    Lymphocytes Relative 11 (*)    All other components within normal limits  CBG MONITORING, ED - Abnormal; Notable for the following:    Glucose-Capillary 113 (*)    All other components within normal limits  PROTIME-INR  COMPREHENSIVE METABOLIC PANEL  POCT CBG (FASTING - GLUCOSE)-MANUAL ENTRY  I-STAT TROPOININ, ED  TYPE AND SCREEN    Imaging Review Dg Chest 2 View  11/03/2014   CLINICAL DATA:  80 year old female with syncope at church. Initial encounter.  EXAM: CHEST  2 VIEW  COMPARISON:  09/01/2011.  FINDINGS: AP and lateral views of the chest. Stable lung volumes. Stable cardiac size and mediastinal contours. Calcified atherosclerosis of the thoracic aorta. No pneumothorax, pulmonary edema, pleural effusion or acute pulmonary opacity. Chronic curvilinear markings at the left lung base compatible with scarring. Chronic bilateral shoulder  arthroplasties. Chronic right lateral rib fractures. Thoracolumbar junction scoliosis. No acute osseous abnormality identified.  IMPRESSION: No acute cardiopulmonary abnormality.   Electronically Signed   By: Lars Pinks M.D.   On: 11/03/2014 14:40     EKG Interpretation   Date/Time:  Sunday November 03 2014 12:06:43 EST Ventricular Rate:  54 PR Interval:  183 QRS Duration: 93 QT Interval:  434 QTC Calculation: 411 R Axis:   45 Text Interpretation:  Sinus rhythm Multiple premature complexes, vent   RSR' in V1 or V2, probably normal variant Confirmed by Monish Haliburton  MD-J, Kaelee Pfeffer  ([id number]) on 11/03/2014 1:12:04 PM      MDM   Final diagnoses:  Syncope, unspecified syncope type    The patient had an episode of syncope prior to arrival. She has a normal exam  at this point in the emergency department. She is asymptomatic. The etiology is unclear.  Symptoms are concerning for the possibility of cardiac related syncope.  Pt was mildly bradycardic initially.  Will consult with medical service regarding admission for obsevation.    Dorie Rank, MD 11/03/14 [phone]
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
## 2014-11-03 NOTE — Plan of Care (Signed)
Problem: Consults Goal: General Medical Patient Education See Patient Education Module for specific education.  Outcome: Completed/Met Date Met:  11/03/14 Goal: Skin Care Protocol Initiated - if Braden Score 18 or less If consults are not indicated, leave blank or document N/A  Outcome: Not Applicable Date Met:  06/05/75 Goal: Nutrition Consult-if indicated Outcome: Not Applicable Date Met:  28/31/51 Goal: Diabetes Guidelines if Diabetic/Glucose > 140 If diabetic or lab glucose is > 140 mg/dl - Initiate Diabetes/Hyperglycemia Guidelines & Document Interventions  Outcome: Not Applicable Date Met:  76/16/07  Problem: Phase I Progression Outcomes Goal: Pain controlled with appropriate interventions Outcome: Completed/Met Date Met:  11/03/14 Goal: OOB as tolerated unless otherwise ordered Outcome: Completed/Met Date Met:  11/03/14 Goal: Initial discharge plan identified Outcome: Completed/Met Date Met:  11/03/14 Goal: Voiding-avoid urinary catheter unless indicated Outcome: Completed/Met Date Met:  11/03/14 Goal: Hemodynamically stable Outcome: Completed/Met Date Met:  11/03/14 Goal: Other Phase I Outcomes/Goals Outcome: Completed/Met Date Met:  11/03/14

## 2014-11-04 DIAGNOSIS — R55 Syncope and collapse: Secondary | ICD-10-CM

## 2014-11-04 LAB — BASIC METABOLIC PANEL
Anion gap: 12 (ref 5–15)
BUN: 13 mg/dL (ref 6–23)
CO2: 25 meq/L (ref 19–32)
Calcium: 9.1 mg/dL (ref 8.4–10.5)
Chloride: 100 mEq/L (ref 96–112)
Creatinine, Ser: 0.71 mg/dL (ref 0.50–1.10)
GFR calc Af Amer: 89 mL/min — ABNORMAL LOW (ref >90)
GFR calc non Af Amer: 77 mL/min — ABNORMAL LOW (ref >90)
GLUCOSE: 96 mg/dL (ref 70–99)
POTASSIUM: 3.9 meq/L (ref 3.7–5.3)
Sodium: 137 mEq/L (ref 137–147)

## 2014-11-04 LAB — CBC
HEMATOCRIT: 40 % (ref 36.0–46.0)
Hemoglobin: 13.3 g/dL (ref 12.0–15.0)
MCH: 31 pg (ref 26.0–34.0)
MCHC: 33.3 g/dL (ref 30.0–36.0)
MCV: 93.2 fL (ref 78.0–100.0)
Platelets: 216 10*3/uL (ref 150–400)
RBC: 4.29 MIL/uL (ref 3.87–5.11)
RDW: 12.1 % (ref 11.5–15.5)
WBC: 6 10*3/uL (ref 4.0–10.5)

## 2014-11-04 LAB — TROPONIN I

## 2014-11-04 NOTE — Progress Notes (Signed)
Pt discharged home with daughter.  Reviewed discharge instructions and education, all questions answered.  Assessment unchanged from earlier.  

## 2014-11-04 NOTE — Discharge Summary (Signed)
Physician Discharge Summary  [name] WUJ:[id number] DOB: November 23, 1928 DOA: 11/03/2014  PCP: No primary care provider on file.  Admit date: 11/03/2014 Discharge date: 11/04/2014  Time spent: 35 minutes  Recommendations for Outpatient Follow-up:  1. Please follow up on blood pressures as they were elevated on this hospitalization  Discharge Diagnoses:  Principal Problem:   Syncope Active Problems:   Essential hypertension   Rheumatoid arthritis   Discharge Condition: Stable  Diet recommendation: Heart Healthy  Filed Weights   11/03/14 1214 11/03/14 1622 11/04/14 0514  Weight: 68.04 kg (150 lb) 67.314 kg (148 lb 6.4 oz) 67.2 kg (148 lb 2.4 oz)    History of present illness:  Patient is a 80 year old female with hypertension, rheumatoid arthritis, vertigo presented to ED with syncopal episode. History was obtained from the patient and the daughter present in the room. Patient reported that she was in the church for at least 45 minutes-1 hour when she felt dizzy and the lights dimming, subsequently passed out. Daughter present in the room reported that she was unconscious for less than a minute, did not have any seizure episode, urinary or bowel incontinence. Patient reported no focal weakness, numbness or tingling or slurred speech.  Patient has had 2 prior episodes of syncope in the past, first was 15 years ago, no cause was ever determined. Patient did report that she has been going through significant stress with packing, cleaning of the house as she is moving to [facility] on December 16th.  Hospital Course:  Patient is a pleasant 80 year old female with a past medical history of hypertension, rheumatoid arthritis, admitted to the medicine service on 11/03/2014 after having a syncopal episode while at church. She reported lights dimming just prior to event. There was loss of consciousness for approximately 1 minute. She came to spontaneously, without evidence of  postictal confusion. She was placed in overnight observation on continuous cardiac monitoring. EKG showed no acute changes neither were there changes on telemetry. She is on beta blocker therapy however heart rates did not go below 55. I doubt bradycardia causing her syncope. Troponins were cycled and remained negative 3 sets. She was not orthostatic. I suspect syncopal event may have been secondary to vasovagal event. By the following morning patient reporting feeling well, had no complaints and thought she was ready to go home. Patient was discharged to her home in stable condition on 11/04/2014.   Discharge Exam: Filed Vitals:   11/04/14 0514  BP: 172/64  Pulse: 69  Temp: 97.9 F (36.6 C)  Resp: 18    General: No acute distress, awake and alert Cardiovascular: Regular rate and rhythm normal S1-S2 Respiratory: Clear to auscultation bilaterally Abdomen: Soft nontender nondistended positive bowel sounds Extremities: No edema  Discharge Instructions You were cared for by a hospitalist during your hospital stay. If you have any questions about your discharge medications or the care you received while you were in the hospital after you are discharged, you can call the unit and asked to speak with the hospitalist on call if the hospitalist that took care of you is not available. Once you are discharged, your primary care physician will handle any further medical issues. Please note that NO REFILLS for any discharge medications will be authorized once you are discharged, as it is imperative that you return to your primary care physician (or establish a relationship with a primary care physician if you do not have one) for your aftercare needs so that they can reassess your  need for medications and monitor your lab values.  Discharge Instructions    Call MD for:  difficulty breathing, headache or visual disturbances    Complete by:  As directed      Call MD for:  extreme fatigue    Complete by:   As directed      Call MD for:  hives    Complete by:  As directed      Call MD for:  persistant dizziness or light-headedness    Complete by:  As directed      Call MD for:  persistant nausea and vomiting    Complete by:  As directed      Call MD for:  redness, tenderness, or signs of infection (pain, swelling, redness, odor or green/yellow discharge around incision site)    Complete by:  As directed      Call MD for:  severe uncontrolled pain    Complete by:  As directed      Call MD for:  temperature >100.4    Complete by:  As directed      Diet - low sodium heart healthy    Complete by:  As directed      Discharge instructions    Complete by:  As directed   Please follow up with your Family Physician in 1-2 week     Increase activity slowly    Complete by:  As directed           Current Discharge Medication List    CONTINUE these medications which have NOT CHANGED   Details  amLODipine (NORVASC) 10 MG tablet Take 10 mg by mouth daily.      aspirin 81 MG tablet Take 81 mg by mouth daily.    calcium carbonate (OS-CAL) 600 MG TABS Take 600 mg by mouth 2 (two) times daily with a meal.      cholecalciferol (VITAMIN D) 1000 UNITS tablet Take 1,000 Units by mouth 2 (two) times daily.     enalapril (VASOTEC) 20 MG tablet Take 20 mg by mouth 2 (two) times daily.      loratadine (CLARITIN) 10 MG tablet Take 10 mg by mouth daily.    Magnesium 250 MG TABS Take 250 mg by mouth daily.      metoprolol (TOPROL-XL) 50 MG 24 hr tablet Take 50 mg by mouth 2 (two) times daily.      Omega-3 Fatty Acids (FISH OIL) 1000 MG CAPS Take by mouth.      predniSONE (DELTASONE) 5 MG tablet Take 5 mg by mouth daily with breakfast.    vitamin C (ASCORBIC ACID) 500 MG tablet Take 500 mg by mouth daily.      hydroxychloroquine (PLAQUENIL) 200 MG tablet Take 200 mg by mouth 2 (two) times daily.      meloxicam (MOBIC) 7.5 MG tablet Take 7.5 mg by mouth daily.      traMADol (ULTRAM) 50 MG tablet  Take 25 mg by mouth every 8 (eight) hours as needed.         No Known Allergies    The results of significant diagnostics from this hospitalization (including imaging, microbiology, ancillary and laboratory) are listed below for reference.    Significant Diagnostic Studies: Dg Chest 2 View  11/03/2014   CLINICAL DATA:  80 year old female with syncope at church. Initial encounter.  EXAM: CHEST  2 VIEW  COMPARISON:  09/01/2011.  FINDINGS: AP and lateral views of the chest. Stable lung volumes. Stable cardiac size and mediastinal  contours. Calcified atherosclerosis of the thoracic aorta. No pneumothorax, pulmonary edema, pleural effusion or acute pulmonary opacity. Chronic curvilinear markings at the left lung base compatible with scarring. Chronic bilateral shoulder arthroplasties. Chronic right lateral rib fractures. Thoracolumbar junction scoliosis. No acute osseous abnormality identified.  IMPRESSION: No acute cardiopulmonary abnormality.   Electronically Signed   By: Lars Pinks M.D.   On: 11/03/2014 14:40    Microbiology: No results found for this or any previous visit (from the past 240 hour(s)).   Labs: Basic Metabolic Panel:  Recent Labs Lab 11/03/14 1254 11/04/14 0421  NA 136* 137  K 3.8 3.9  CL 96 100  CO2 23 25  GLUCOSE 106* 96  BUN 15 13  CREATININE 0.84 0.71  CALCIUM 9.8 9.1   Liver Function Tests:  Recent Labs Lab 11/03/14 1254  AST 19  ALT 11  ALKPHOS 44  BILITOT 0.6  PROT 6.2  ALBUMIN 3.5   No results for input(s): LIPASE, AMYLASE in the last 168 hours. No results for input(s): AMMONIA in the last 168 hours. CBC:  Recent Labs Lab 11/03/14 1254 11/04/14 0421  WBC 8.1 6.0  NEUTROABS 6.3  --   HGB 14.2 13.3  HCT 42.4 40.0  MCV 95.1 93.2  PLT 222 216   Cardiac Enzymes:  Recent Labs Lab 11/03/14 1700 11/03/14 2241 11/04/14 0421  TROPONINI <0.30 <0.30 <0.30   BNP: BNP (last 3 results) No results for input(s): PROBNP in the last 8760  hours. CBG:  Recent Labs Lab 11/03/14 1254  GLUCAP 113*       Signed:  Fujie Dickison  [facility] 11/04/2014, 8:56 AM

## 2014-11-05 LAB — URINE CULTURE: Colony Count: 70000

## 2015-06-20 ENCOUNTER — Ambulatory Visit (INDEPENDENT_AMBULATORY_CARE_PROVIDER_SITE_OTHER): Payer: PPO | Admitting: Emergency Medicine

## 2015-06-20 VITALS — BP 130/60 | HR 65 | Temp 98.8°F | Resp 17 | Ht 62.5 in | Wt 143.0 lb

## 2015-06-20 DIAGNOSIS — N3000 Acute cystitis without hematuria: Secondary | ICD-10-CM | POA: Diagnosis not present

## 2015-06-20 DIAGNOSIS — R509 Fever, unspecified: Secondary | ICD-10-CM

## 2015-06-20 LAB — CBC WITH DIFFERENTIAL/PLATELET
BASOS ABS: 0 10*3/uL (ref 0.0–0.1)
Basophils Relative: 0 % (ref 0–1)
EOS PCT: 0 % (ref 0–5)
Eosinophils Absolute: 0 10*3/uL (ref 0.0–0.7)
HCT: 41 % (ref 36.0–46.0)
Hemoglobin: 14.2 g/dL (ref 12.0–15.0)
LYMPHS PCT: 4 % — AB (ref 12–46)
Lymphs Abs: 0.6 10*3/uL — ABNORMAL LOW (ref 0.7–4.0)
MCH: 30.9 pg (ref 26.0–34.0)
MCHC: 34.6 g/dL (ref 30.0–36.0)
MCV: 89.1 fL (ref 78.0–100.0)
MPV: 9 fL (ref 8.6–12.4)
Monocytes Absolute: 0.7 10*3/uL (ref 0.1–1.0)
Monocytes Relative: 5 % (ref 3–12)
Neutro Abs: 13.4 10*3/uL — ABNORMAL HIGH (ref 1.7–7.7)
Neutrophils Relative %: 91 % — ABNORMAL HIGH (ref 43–77)
PLATELETS: 234 10*3/uL (ref 150–400)
RBC: 4.6 MIL/uL (ref 3.87–5.11)
RDW: 12.9 % (ref 11.5–15.5)
WBC: 14.7 10*3/uL — ABNORMAL HIGH (ref 4.0–10.5)

## 2015-06-20 LAB — POCT UA - MICROSCOPIC ONLY
Casts, Ur, LPF, POC: NEGATIVE
Crystals, Ur, HPF, POC: NEGATIVE
MUCUS UA: NEGATIVE
YEAST UA: NEGATIVE

## 2015-06-20 LAB — POCT URINALYSIS DIPSTICK
Glucose, UA: NEGATIVE
NITRITE UA: NEGATIVE
PH UA: 5.5
Protein, UA: 30
Spec Grav, UA: 1.02
UROBILINOGEN UA: 0.2

## 2015-06-20 MED ORDER — CIPROFLOXACIN HCL 500 MG PO TABS: 500.0000 mg | ORAL_TABLET | Freq: Two times a day (BID) | ORAL | Status: DC

## 2015-06-20 NOTE — Progress Notes (Signed)
Subjective:  Patient ID: [name], female    DOB: 02/22/1928  Age: 81 y.o. MRN: [id number]  CC: Fever and Chills   HPI [name] presents  with a history of 48 hours of fever and chills. She had a fever at her nursing home of 103. She has no headache ear pain and nasal congestion or sore throat. She has no cough, wheezing, shortness of breath. She has no abdominal pain. No nausea or vomiting. No stool change. She has no dysuria urgency or frequency. Has no rash. Has no headache. She has had no complaints of an acute illness other fever. She has no ill contacts. Has no improvement or fever with over-the-counter medication. Currently she afebrile  History [name] has a past medical history of Hypertension; Vertigo; RA (rheumatoid arthritis); and RA (rheumatoid arthritis).   She has past surgical history that includes Replacement total knee (Right); Shoulder surgery (Bilateral); and Cervical spine surgery.   Her  family history is not on file.  She   reports that she has quit smoking. She does not have any smokeless tobacco history on file. She reports that she does not drink alcohol or use illicit drugs.  Outpatient Prescriptions Prior to Visit  Medication Sig Dispense Refill  . amLODipine (NORVASC) 10 MG tablet Take 10 mg by mouth daily.      Marland Kitchen aspirin 81 MG tablet Take 81 mg by mouth daily.    . calcium carbonate (OS-CAL) 600 MG TABS Take 600 mg by mouth 2 (two) times daily with a meal.      . cholecalciferol (VITAMIN D) 1000 UNITS tablet Take 1,000 Units by mouth 2 (two) times daily.     . enalapril (VASOTEC) 20 MG tablet Take 20 mg by mouth 2 (two) times daily.      . hydroxychloroquine (PLAQUENIL) 200 MG tablet Take 200 mg by mouth 2 (two) times daily.      Marland Kitchen loratadine (CLARITIN) 10 MG tablet Take 10 mg by mouth daily.    . Magnesium 250 MG TABS Take 250 mg by mouth daily.      . meloxicam (MOBIC) 7.5 MG tablet Take 7.5 mg by mouth daily.      . metoprolol  (TOPROL-XL) 50 MG 24 hr tablet Take 50 mg by mouth 2 (two) times daily.      . Omega-3 Fatty Acids (FISH OIL) 1000 MG CAPS Take by mouth.      . predniSONE (DELTASONE) 5 MG tablet Take 5 mg by mouth daily with breakfast.    . traMADol (ULTRAM) 50 MG tablet Take 25 mg by mouth every 8 (eight) hours as needed.      . vitamin C (ASCORBIC ACID) 500 MG tablet Take 500 mg by mouth daily.       No facility-administered medications prior to visit.    History   Social History  . Marital Status: Widowed    Spouse Name: N/A  . Number of Children: N/A  . Years of Education: N/A   Social History Main Topics  . Smoking status: Former Research scientist (life sciences)  . Smokeless tobacco: Not on file  . Alcohol Use: No  . Drug Use: No  . Sexual Activity: Not on file   Other Topics Concern  . None   Social History Narrative     Review of Systems  Constitutional: Positive for fever and chills. Negative for appetite change.  HENT: Negative for congestion, ear pain, postnasal drip, sinus pressure and sore throat.   Eyes:  Negative for pain and redness.  Respiratory: Negative for cough, shortness of breath and wheezing.   Cardiovascular: Negative for leg swelling.  Gastrointestinal: Negative for nausea, vomiting, abdominal pain, diarrhea, constipation and blood in stool.  Endocrine: Negative for polyuria.  Genitourinary: Negative for dysuria, urgency, frequency and flank pain.  Musculoskeletal: Negative for gait problem.  Skin: Negative for rash.  Neurological: Negative for weakness and headaches.  Psychiatric/Behavioral: Negative for confusion and decreased concentration. The patient is not nervous/anxious.     Objective:  BP 130/60 mmHg  Pulse 65  Temp(Src) 98.8 F (37.1 C) (Oral)  Resp 17  Ht 5' 2.5" (1.588 m)  Wt 143 lb (64.864 kg)  BMI 25.72 kg/m2  SpO2 95%  Physical Exam  Constitutional: She is oriented to person, place, and time. She appears well-developed and well-nourished. No distress.  HENT:    Head: Normocephalic and atraumatic.  Right Ear: External ear normal.  Left Ear: External ear normal.  Nose: Nose normal.  Eyes: Conjunctivae and EOM are normal. Pupils are equal, round, and reactive to light. No scleral icterus.  Neck: Normal range of motion. Neck supple. No tracheal deviation present.  Cardiovascular: Normal rate, regular rhythm and normal heart sounds.   Pulmonary/Chest: Effort normal. No respiratory distress. She has no wheezes. She has no rales.  Abdominal: She exhibits no mass. There is no tenderness. There is no rebound and no guarding.  Musculoskeletal: She exhibits no edema.  Lymphadenopathy:    She has no cervical adenopathy.  Neurological: She is alert and oriented to person, place, and time. Coordination normal.  Skin: Skin is warm and dry. No rash noted.  Psychiatric: She has a normal mood and affect. Her behavior is normal.      Assessment & Plan:   [name] was seen today for fever and chills.  Diagnoses and all orders for this visit:  Fever, unspecified fever cause Orders: -     Cancel: POCT CBC -     POCT UA - Microscopic Only -     POCT urinalysis dipstick -     CBC with Differential/Platelet  Acute cystitis without hematuria Orders: -     Urine culture  Other orders -     ciprofloxacin (CIPRO) 500 MG tablet; Take 1 tablet (500 mg total) by mouth 2 (two) times daily.   I am having [name] start on ciprofloxacin. I am also having her maintain her enalapril, metoprolol succinate, meloxicam, hydroxychloroquine, amLODipine, traMADol, calcium carbonate, cholecalciferol, Fish Oil, vitamin C, Magnesium, predniSONE, aspirin, loratadine, and Wheat Dextrin (BENEFIBER PO).  Meds ordered this encounter  Medications  . Wheat Dextrin (BENEFIBER PO)    Sig: Take by mouth.  . ciprofloxacin (CIPRO) 500 MG tablet    Sig: Take 1 tablet (500 mg total) by mouth 2 (two) times daily.    Dispense:  6 tablet    Refill:  0   Her urine test was not  completely characteristic for urinary tract infection but will going to start her on some antibiotic for it and get a culture.  Appropriate red flag conditions were discussed with the patient as well as actions that should be taken.  Patient expressed his understanding.  Follow-up: Return if symptoms worsen or fail to improve.  Roselee Culver, MD    Results for orders placed or performed in visit on 06/20/15  POCT UA - Microscopic Only  Result Value Ref Range   WBC, Ur, HPF, POC 1-5    RBC, urine, microscopic 0-1  Bacteria, U Microscopic 4+    Mucus, UA neg    Epithelial cells, urine per micros TNTC    Crystals, Ur, HPF, POC neg    Casts, Ur, LPF, POC neg    Yeast, UA neg   POCT urinalysis dipstick  Result Value Ref Range   Color, UA yellow    Clarity, UA cloudy    Glucose, UA neg    Bilirubin, UA small    Ketones, UA trace    Spec Grav, UA 1.020    Blood, UA trace-intact    pH, UA 5.5    Protein, UA 30    Urobilinogen, UA 0.2    Nitrite, UA neg    Leukocytes, UA Trace (A) Negative

## 2015-06-20 NOTE — Patient Instructions (Signed)

## 2015-06-21 LAB — URINE CULTURE: Colony Count: 70000

## 2015-08-08 LAB — HM MAMMOGRAPHY

## 2015-12-31 DIAGNOSIS — M5137 Other intervertebral disc degeneration, lumbosacral region: Secondary | ICD-10-CM | POA: Diagnosis not present

## 2015-12-31 DIAGNOSIS — M8580 Other specified disorders of bone density and structure, unspecified site: Secondary | ICD-10-CM | POA: Diagnosis not present

## 2015-12-31 DIAGNOSIS — Z79899 Other long term (current) drug therapy: Secondary | ICD-10-CM | POA: Diagnosis not present

## 2015-12-31 DIAGNOSIS — Z09 Encounter for follow-up examination after completed treatment for conditions other than malignant neoplasm: Secondary | ICD-10-CM | POA: Diagnosis not present

## 2015-12-31 DIAGNOSIS — E559 Vitamin D deficiency, unspecified: Secondary | ICD-10-CM | POA: Diagnosis not present

## 2015-12-31 DIAGNOSIS — M0579 Rheumatoid arthritis with rheumatoid factor of multiple sites without organ or systems involvement: Secondary | ICD-10-CM | POA: Diagnosis not present

## 2016-04-12 DIAGNOSIS — I4891 Unspecified atrial fibrillation: Secondary | ICD-10-CM

## 2016-04-12 DIAGNOSIS — R0602 Shortness of breath: Secondary | ICD-10-CM

## 2016-04-12 HISTORY — DX: Shortness of breath: R06.02

## 2016-04-12 HISTORY — DX: Unspecified atrial fibrillation: I48.91

## 2016-05-03 ENCOUNTER — Inpatient Hospital Stay (HOSPITAL_COMMUNITY)
Admission: EM | Admit: 2016-05-03 | Discharge: 2016-05-07 | DRG: 308 | Disposition: A | Discharge status: Home / Self Care | Payer: PPO | Attending: Internal Medicine | Admitting: Internal Medicine

## 2016-05-03 ENCOUNTER — Encounter (HOSPITAL_COMMUNITY): Payer: Self-pay | Admitting: Emergency Medicine

## 2016-05-03 ENCOUNTER — Emergency Department (HOSPITAL_COMMUNITY): Payer: PPO

## 2016-05-03 DIAGNOSIS — Z79899 Other long term (current) drug therapy: Secondary | ICD-10-CM | POA: Diagnosis not present

## 2016-05-03 DIAGNOSIS — Z87891 Personal history of nicotine dependence: Secondary | ICD-10-CM | POA: Diagnosis not present

## 2016-05-03 DIAGNOSIS — E871 Hypo-osmolality and hyponatremia: Secondary | ICD-10-CM | POA: Diagnosis not present

## 2016-05-03 DIAGNOSIS — Z981 Arthrodesis status: Secondary | ICD-10-CM | POA: Diagnosis not present

## 2016-05-03 DIAGNOSIS — R918 Other nonspecific abnormal finding of lung field: Secondary | ICD-10-CM | POA: Diagnosis not present

## 2016-05-03 DIAGNOSIS — J9601 Acute respiratory failure with hypoxia: Secondary | ICD-10-CM | POA: Diagnosis not present

## 2016-05-03 DIAGNOSIS — J9811 Atelectasis: Secondary | ICD-10-CM | POA: Diagnosis present

## 2016-05-03 DIAGNOSIS — I10 Essential (primary) hypertension: Secondary | ICD-10-CM | POA: Diagnosis not present

## 2016-05-03 DIAGNOSIS — M069 Rheumatoid arthritis, unspecified: Secondary | ICD-10-CM | POA: Diagnosis present

## 2016-05-03 DIAGNOSIS — Z7982 Long term (current) use of aspirin: Secondary | ICD-10-CM

## 2016-05-03 DIAGNOSIS — Z7952 Long term (current) use of systemic steroids: Secondary | ICD-10-CM | POA: Diagnosis not present

## 2016-05-03 DIAGNOSIS — R911 Solitary pulmonary nodule: Secondary | ICD-10-CM | POA: Diagnosis present

## 2016-05-03 DIAGNOSIS — M40209 Unspecified kyphosis, site unspecified: Secondary | ICD-10-CM | POA: Diagnosis present

## 2016-05-03 DIAGNOSIS — I480 Paroxysmal atrial fibrillation: Principal | ICD-10-CM | POA: Diagnosis present

## 2016-05-03 DIAGNOSIS — I4891 Unspecified atrial fibrillation: Secondary | ICD-10-CM | POA: Diagnosis not present

## 2016-05-03 DIAGNOSIS — I5032 Chronic diastolic (congestive) heart failure: Secondary | ICD-10-CM | POA: Diagnosis present

## 2016-05-03 DIAGNOSIS — Z09 Encounter for follow-up examination after completed treatment for conditions other than malignant neoplasm: Secondary | ICD-10-CM

## 2016-05-03 DIAGNOSIS — J90 Pleural effusion, not elsewhere classified: Secondary | ICD-10-CM | POA: Diagnosis not present

## 2016-05-03 DIAGNOSIS — J189 Pneumonia, unspecified organism: Secondary | ICD-10-CM | POA: Diagnosis not present

## 2016-05-03 DIAGNOSIS — Z96651 Presence of right artificial knee joint: Secondary | ICD-10-CM | POA: Diagnosis not present

## 2016-05-03 DIAGNOSIS — R0902 Hypoxemia: Secondary | ICD-10-CM | POA: Diagnosis not present

## 2016-05-03 DIAGNOSIS — R938 Abnormal findings on diagnostic imaging of other specified body structures: Secondary | ICD-10-CM | POA: Diagnosis not present

## 2016-05-03 DIAGNOSIS — Z8673 Personal history of transient ischemic attack (TIA), and cerebral infarction without residual deficits: Secondary | ICD-10-CM | POA: Diagnosis not present

## 2016-05-03 DIAGNOSIS — I110 Hypertensive heart disease with heart failure: Secondary | ICD-10-CM | POA: Diagnosis present

## 2016-05-03 DIAGNOSIS — I501 Left ventricular failure: Secondary | ICD-10-CM | POA: Diagnosis present

## 2016-05-03 DIAGNOSIS — R0602 Shortness of breath: Secondary | ICD-10-CM | POA: Diagnosis not present

## 2016-05-03 DIAGNOSIS — R9389 Abnormal findings on diagnostic imaging of other specified body structures: Secondary | ICD-10-CM

## 2016-05-03 HISTORY — DX: Unspecified atrial fibrillation: I48.91

## 2016-05-03 LAB — COMPREHENSIVE METABOLIC PANEL
ALT: 23 U/L (ref 14–54)
AST: 23 U/L (ref 15–41)
Albumin: 3.6 g/dL (ref 3.5–5.0)
Alkaline Phosphatase: 51 U/L (ref 38–126)
Anion gap: 9 (ref 5–15)
BILIRUBIN TOTAL: 1 mg/dL (ref 0.3–1.2)
BUN: 12 mg/dL (ref 6–20)
CHLORIDE: 99 mmol/L — AB (ref 101–111)
CO2: 26 mmol/L (ref 22–32)
Calcium: 9.5 mg/dL (ref 8.9–10.3)
Creatinine, Ser: 0.84 mg/dL (ref 0.44–1.00)
Glucose, Bld: 117 mg/dL — ABNORMAL HIGH (ref 65–99)
Potassium: 4.2 mmol/L (ref 3.5–5.1)
Sodium: 134 mmol/L — ABNORMAL LOW (ref 135–145)
TOTAL PROTEIN: 5.9 g/dL — AB (ref 6.5–8.1)

## 2016-05-03 LAB — TSH: TSH: 0.829 u[IU]/mL (ref 0.350–4.500)

## 2016-05-03 LAB — CBC WITH DIFFERENTIAL/PLATELET
Basophils Absolute: 0 10*3/uL (ref 0.0–0.1)
Basophils Relative: 0 %
EOS PCT: 0 %
Eosinophils Absolute: 0 10*3/uL (ref 0.0–0.7)
HEMATOCRIT: 39 % (ref 36.0–46.0)
Hemoglobin: 12.6 g/dL (ref 12.0–15.0)
LYMPHS ABS: 0.9 10*3/uL (ref 0.7–4.0)
LYMPHS PCT: 11 %
MCH: 30.3 pg (ref 26.0–34.0)
MCHC: 32.3 g/dL (ref 30.0–36.0)
MCV: 93.8 fL (ref 78.0–100.0)
MONO ABS: 0.4 10*3/uL (ref 0.1–1.0)
Monocytes Relative: 5 %
NEUTROS ABS: 7 10*3/uL (ref 1.7–7.7)
Neutrophils Relative %: 84 %
PLATELETS: 219 10*3/uL (ref 150–400)
RBC: 4.16 MIL/uL (ref 3.87–5.11)
RDW: 13.1 % (ref 11.5–15.5)
WBC: 8.3 10*3/uL (ref 4.0–10.5)

## 2016-05-03 LAB — I-STAT TROPONIN, ED: TROPONIN I, POC: 0 ng/mL (ref 0.00–0.08)

## 2016-05-03 LAB — PROTIME-INR
INR: 1.23 (ref 0.00–1.49)
Prothrombin Time: 15.6 seconds — ABNORMAL HIGH (ref 11.6–15.2)

## 2016-05-03 LAB — TROPONIN I: Troponin I: <0.03 ng/mL (ref <0.031)

## 2016-05-03 LAB — T4, FREE: FREE T4: 1.44 ng/dL — AB (ref 0.61–1.12)

## 2016-05-03 MED ORDER — PREDNISONE 1 MG PO TABS
4.0000 mg | ORAL_TABLET | Freq: Every day | ORAL | Status: DC
  Administered 2016-05-04 – 2016-05-07 (×4): 4 mg via ORAL
  Filled 2016-05-03 (×4): qty 4

## 2016-05-03 MED ORDER — SODIUM CHLORIDE 0.9 % IV SOLN
INTRAVENOUS | Status: AC
  Administered 2016-05-03: 22:00:00 via INTRAVENOUS

## 2016-05-03 MED ORDER — ONDANSETRON HCL 4 MG/2ML IJ SOLN: 4.0000 mg | Freq: Four times a day (QID) | INTRAMUSCULAR | Status: DC | PRN

## 2016-05-03 MED ORDER — DEXTROSE 5 % IV SOLN
500.0000 mg | Freq: Once | INTRAVENOUS | Status: AC
  Administered 2016-05-03: 500 mg via INTRAVENOUS
  Filled 2016-05-03 (×2): qty 500

## 2016-05-03 MED ORDER — DILTIAZEM HCL 25 MG/5ML IV SOLN
10.0000 mg | Freq: Once | INTRAVENOUS | Status: AC
  Administered 2016-05-03: 10 mg via INTRAVENOUS
  Filled 2016-05-03: qty 5

## 2016-05-03 MED ORDER — VITAMIN D 1000 UNITS PO TABS
2500.0000 [IU] | ORAL_TABLET | Freq: Every day | ORAL | Status: DC
  Administered 2016-05-04 – 2016-05-07 (×4): 2500 [IU] via ORAL
  Filled 2016-05-03 (×5): qty 3

## 2016-05-03 MED ORDER — IOPAMIDOL (ISOVUE-370) INJECTION 76%
INTRAVENOUS | Status: AC
  Administered 2016-05-03: 80 mL
  Filled 2016-05-03: qty 100

## 2016-05-03 MED ORDER — ACETAMINOPHEN 325 MG PO TABS: 650.0000 mg | ORAL_TABLET | ORAL | Status: DC | PRN

## 2016-05-03 MED ORDER — LORATADINE 10 MG PO TABS
10.0000 mg | ORAL_TABLET | Freq: Every day | ORAL | Status: DC
  Administered 2016-05-04 – 2016-05-07 (×4): 10 mg via ORAL
  Filled 2016-05-03 (×4): qty 1

## 2016-05-03 MED ORDER — METOPROLOL SUCCINATE ER 50 MG PO TB24
50.0000 mg | ORAL_TABLET | Freq: Two times a day (BID) | ORAL | Status: DC
  Administered 2016-05-03 – 2016-05-07 (×8): 50 mg via ORAL
  Filled 2016-05-03 (×8): qty 1

## 2016-05-03 MED ORDER — ENALAPRIL MALEATE 10 MG PO TABS
20.0000 mg | ORAL_TABLET | Freq: Two times a day (BID) | ORAL | Status: DC
  Administered 2016-05-03 – 2016-05-07 (×8): 20 mg via ORAL
  Filled 2016-05-03 (×8): qty 2

## 2016-05-03 MED ORDER — SODIUM CHLORIDE 0.9% FLUSH
3.0000 mL | Freq: Two times a day (BID) | INTRAVENOUS | Status: DC
  Administered 2016-05-04 – 2016-05-07 (×5): 3 mL via INTRAVENOUS

## 2016-05-03 MED ORDER — RIVAROXABAN 15 MG PO TABS: 15.0000 mg | ORAL_TABLET | Freq: Once | ORAL | Status: DC

## 2016-05-03 MED ORDER — HEPARIN BOLUS VIA INFUSION
3500.0000 [IU] | Freq: Once | INTRAVENOUS | Status: AC
  Administered 2016-05-03: 3500 [IU] via INTRAVENOUS
  Filled 2016-05-03: qty 3500

## 2016-05-03 MED ORDER — SODIUM CHLORIDE 0.9% FLUSH: 3.0000 mL | INTRAVENOUS | Status: DC | PRN

## 2016-05-03 MED ORDER — AZITHROMYCIN 250 MG PO TABS
250.0000 mg | ORAL_TABLET | Freq: Every day | ORAL | Status: DC
  Administered 2016-05-04 – 2016-05-07 (×4): 250 mg via ORAL
  Filled 2016-05-03 (×4): qty 1

## 2016-05-03 MED ORDER — DEXTROSE 5 % IV SOLN
1.0000 g | Freq: Once | INTRAVENOUS | Status: AC
  Administered 2016-05-03: 1 g via INTRAVENOUS
  Filled 2016-05-03: qty 10

## 2016-05-03 MED ORDER — CALCIUM CARBONATE 1250 (500 CA) MG PO TABS
1250.0000 mg | ORAL_TABLET | Freq: Two times a day (BID) | ORAL | Status: DC
  Administered 2016-05-04 – 2016-05-07 (×7): 1250 mg via ORAL
  Filled 2016-05-03 (×7): qty 1

## 2016-05-03 MED ORDER — DOCUSATE SODIUM 100 MG PO CAPS
200.0000 mg | ORAL_CAPSULE | Freq: Every evening | ORAL | Status: DC
  Administered 2016-05-03 – 2016-05-06 (×4): 200 mg via ORAL
  Filled 2016-05-03 (×4): qty 2

## 2016-05-03 MED ORDER — ASPIRIN EC 81 MG PO TBEC
81.0000 mg | DELAYED_RELEASE_TABLET | Freq: Every day | ORAL | Status: DC
  Administered 2016-05-03 – 2016-05-06 (×4): 81 mg via ORAL
  Filled 2016-05-03 (×4): qty 1

## 2016-05-03 MED ORDER — SODIUM CHLORIDE 0.9 % IV SOLN: 250.0000 mL | INTRAVENOUS | Status: DC | PRN

## 2016-05-03 MED ORDER — HYDROXYCHLOROQUINE SULFATE 200 MG PO TABS
200.0000 mg | ORAL_TABLET | Freq: Two times a day (BID) | ORAL | Status: DC
  Administered 2016-05-03 – 2016-05-07 (×8): 200 mg via ORAL
  Filled 2016-05-03 (×8): qty 1

## 2016-05-03 MED ORDER — MAGNESIUM OXIDE 400 (241.3 MG) MG PO TABS
200.0000 mg | ORAL_TABLET | Freq: Every day | ORAL | Status: DC
  Administered 2016-05-03 – 2016-05-07 (×5): 200 mg via ORAL
  Filled 2016-05-03 (×5): qty 1

## 2016-05-03 MED ORDER — DILTIAZEM HCL 30 MG PO TABS
30.0000 mg | ORAL_TABLET | Freq: Four times a day (QID) | ORAL | Status: DC
  Administered 2016-05-03 – 2016-05-05 (×6): 30 mg via ORAL
  Filled 2016-05-03 (×6): qty 1

## 2016-05-03 MED ORDER — HEPARIN (PORCINE) IN NACL 100-0.45 UNIT/ML-% IJ SOLN
750.0000 [IU]/h | INTRAMUSCULAR | Status: AC
  Administered 2016-05-03: 900 [IU]/h via INTRAVENOUS
  Filled 2016-05-03: qty 250

## 2016-05-03 MED ORDER — DEXTROSE 5 % IV SOLN
1.0000 g | INTRAVENOUS | Status: DC
  Administered 2016-05-04 – 2016-05-06 (×3): 1 g via INTRAVENOUS
  Filled 2016-05-03 (×4): qty 10

## 2016-05-03 MED ORDER — FUROSEMIDE 10 MG/ML IJ SOLN
20.0000 mg | Freq: Once | INTRAMUSCULAR | Status: AC
  Administered 2016-05-03: 20 mg via INTRAVENOUS
  Filled 2016-05-03: qty 2

## 2016-05-03 NOTE — ED Notes (Signed)
Per EMS: pt from [facility] c/o increased SOB starting this am; pt noted to be in afib without hx of same

## 2016-05-03 NOTE — ED Notes (Addendum)
Patient transported to x-ray. ?

## 2016-05-03 NOTE — ED Notes (Signed)
Attempted report x 2 

## 2016-05-03 NOTE — H&P (Addendum)
History and Physical    [name] [id number] DOB: 1928-01-16 DOA: 05/03/2016  PCP: Lilian Coma, MD  Patient coming from: Home ([place])  Chief Complaint: Shortness of breath  HPI: [name] is a 82 y.o. woman with a history of HTN, RA (on prednisone and plaquenil), and vertigo who feels that she was in her baseline state of health until Saturday.  At that time, she noted mild, intermittent exertional dyspnea, not associated with any other symptoms.  This morning, she felt that her exertional dyspnea worsened significantly, and she called for urgent evaluation.  She was referred to the ED via EMS.  She has not had any associated chest pain, pressure, or tightness.  No lightheadedness, dizziness, or syncope.  No headache or vision disturbance.  No falls.  Chronic weakness attributed to her RA.  No nausea, vomiting, or diaphoresis.  No swelling.  No changes in weight.  Normal appetite.  No blood in her urine or stool.  No diarrhea.  ED Course: The patient was found to have mild hypoxia on room air, improved with supplemental oxygen via [place].  Her EKG shows new onset atrial fibrillation, rate 110's.  Initial troponin is negative.  Chest xray suggested a RML pneumonia, but the patient has not had fever, productive cough, or signs of systemic infection.  This finding, along with her presenting complaint of shortness and discovery of new arrhythmia, prompted referral for CTA of the chest.  PE has been ruled out, but she has unexpected findings of a RLL process (mass vs infection), at least two pulmonary nodules (LUL and RLL), and a question of left hilar fullness.  Hospitalist asked to admit for further evaluation and treatment.  Review of Systems: 10 systems reviewed and negative except as stated in the HPI.  Past Medical History  Diagnosis Date  . Hypertension   . Vertigo   . RA (rheumatoid arthritis) ([place])   . RA (rheumatoid arthritis) [facility])     Past Surgical  History  Procedure Laterality Date  . Replacement total knee Right   . Shoulder surgery Bilateral   . Cervical spine surgery      reports that she has quit smoking. She does not have any smokeless tobacco history on file. She reports that she does not drink alcohol or use illicit drugs.  REMOTE tobacco use (50 years ago).  No EtOH or illicit drug use.  She is a widow.  She has two adult children.  Her daughter Mary Sella is her POA.  Allergies  Allergen Reactions  . Other Nausea And Vomiting    Pt states she is very sensitive to pain medications.  Reports intolerance to narcotics, as well as anesthesia.  FAMILY HISTORY: She denies any known family history of thyroid disorders or heart disease including CAD, CHF, or arrhythmias.  She also denies any known family history of malignancy.  Prior to Admission medications   Medication Sig Start Date End Date Taking? Authorizing Provider  amLODipine (NORVASC) 10 MG tablet Take 10 mg by mouth daily.     Yes Historical Provider, MD  aspirin 81 MG tablet Take 81 mg by mouth daily.   Yes Historical Provider, MD  calcium carbonate (OS-CAL) 600 MG TABS Take 600 mg by mouth 2 (two) times daily with a meal.     Yes Historical Provider, MD  cholecalciferol (VITAMIN D) 1000 UNITS tablet Take 2,500 Units by mouth daily.    Yes Historical Provider, MD  docusate sodium (COLACE) 100 MG capsule  Take 200 mg by mouth every evening.   Yes Historical Provider, MD  enalapril (VASOTEC) 20 MG tablet Take 20 mg by mouth 2 (two) times daily.     Yes Historical Provider, MD  hydroxychloroquine (PLAQUENIL) 200 MG tablet Take 200 mg by mouth 2 (two) times daily.     Yes Historical Provider, MD  loratadine (CLARITIN) 10 MG tablet Take 10 mg by mouth daily.   Yes Historical Provider, MD  Magnesium 250 MG TABS Take 250 mg by mouth daily.     Yes Historical Provider, MD  metoprolol (TOPROL-XL) 50 MG 24 hr tablet Take 50 mg by mouth 2 (two) times daily.     Yes Historical Provider, MD   Omega-3 Fatty Acids (FISH OIL) 1000 MG CAPS Take 1 capsule by mouth every evening.    Yes Historical Provider, MD  predniSONE (DELTASONE) 1 MG tablet Take 4 mg by mouth daily with breakfast.   Yes Historical Provider, MD  vitamin C (ASCORBIC ACID) 500 MG tablet Take 500 mg by mouth daily.     Yes Historical Provider, MD  Wheat Dextrin (BENEFIBER PO) Take 4 scoop by mouth daily.    Yes Historical Provider, MD    Physical Exam: Filed Vitals:   05/03/16 1900 05/03/16 1915 05/03/16 1930 05/03/16 1945  BP: 141/73 139/83 147/93 159/96  Pulse: 101 90 81 101  Temp:      TempSrc:      Resp:    23  SpO2: 98% 97% 99% 98%      Constitutional: NAD, calm, comfortable Filed Vitals:   05/03/16 1900 05/03/16 1915 05/03/16 1930 05/03/16 1945  BP: 141/73 139/83 147/93 159/96  Pulse: 101 90 81 101  Temp:      TempSrc:      Resp:    23  SpO2: 98% 97% 99% 98%   Eyes: PERRL, lids and conjunctivae normal ENMT: Mucous membranes are moist. Posterior pharynx clear of any exudate or lesions.Normal dentition.  Neck: normal, supple, no masses Respiratory: clear to auscultation bilaterally, a few basilar crackles cleared with deep inspiration, no wheezing.  Normal respiratory effort. No accessory muscle use.  Cardiovascular: Irregular and only mildy tachycardic.  No murmurs / rubs / gallops. No extremity edema. 2+ pedal pulses. No carotid bruits.  Abdomen: no tenderness, no masses palpated. Bowel sounds positive.  Musculoskeletal: no clubbing / cyanosis. No gross joint deformity upper and lower extremities. ROM limited by chronic changes associated with history of RA.  No contractures. Normal muscle tone.  Skin: no rashes, lesions, ulcers. No induration. Neurologic: CN 2-12 grossly intact. Sensation intact, Strength symmetric bilaterally. Psychiatric: Normal judgment and insight. Alert and oriented x 3. Normal mood.   Labs on Admission: I have personally reviewed following labs and imaging  studies  CBC:  Recent Labs Lab 05/03/16 1554  WBC 8.3  NEUTROABS 7.0  HGB 12.6  HCT 39.0  MCV 93.8  PLT [id number]   Basic Metabolic Panel:  Recent Labs Lab 05/03/16 1554  NA 134*  K 4.2  CL 99*  CO2 26  GLUCOSE 117*  BUN 12  CREATININE 0.84  CALCIUM 9.5   Liver Function Tests:  Recent Labs Lab 05/03/16 1554  AST 23  ALT 23  ALKPHOS 51  BILITOT 1.0  PROT 5.9*  ALBUMIN 3.6   Radiological Exams on Admission: Dg Chest 2 View  05/03/2016  CLINICAL DATA:  Shortness of breath onset today. EXAM: CHEST  2 VIEW COMPARISON:  11/03/2014 FINDINGS: Lungs are adequately inflated with opacification  over the right middle lobe and lung bases. Findings likely due to infection. Small amount bilateral pleural fluid right greater than left. Cardiomediastinal silhouette is within normal. There is calcified plaque over the aortic arch. There are degenerative changes of the spine Lynne with biphasic curvature of the thoracolumbar spine unchanged. Bilateral shoulder prostheses unchanged. In fusion hardware over the cervical is intact. IMPRESSION: Airspace process over the right middle lobe and lung bases likely pneumonia. Small amount of bilateral pleural fluid right greater than left. Electronically Signed   By: Marin Olp M.D.   On: 05/03/2016 16:36   Ct Angio Chest Pe W/cm &/or Wo Cm  05/03/2016  CLINICAL DATA:  Shortness of breath, evaluate for PE EXAM: CT ANGIOGRAPHY CHEST WITH CONTRAST TECHNIQUE: Multidetector CT imaging of the chest was performed using the standard protocol during bolus administration of intravenous contrast. Multiplanar CT image reconstructions and MIPs were obtained to evaluate the vascular anatomy. CONTRAST:  80 mL Isovue 370 IV COMPARISON:  Chest radiographs dated 05/03/2016 FINDINGS: No evidence of pulmonary embolism. Mediastinum/Nodes: The heart is top-normal in size. No pericardial effusion. Coronary atherosclerosis in the LAD. Atherosclerotic calcifications of the  aortic arch. No suspicious mediastinal lymphadenopathy. Visualized thyroid is unremarkable. Lungs/Pleura: Patchy/nodular right lower lobe opacity (series 403/images 80 and 86). Dominant central 1.4 x 2.2 cm nodule (series 403/image 79) with adjacent tree-in-bud nodularity (series 403/image 76). Overall, it is unclear to what extent this appearance reflect infection versus atelectasis, with tumor not excluded. Additional 7 x 11 mm nodule in the central left lower lobe (series 403/image 76), as well as additional nodularity in the posterior right upper lobe (series 403/image 30 and 36) measuring up to 6 mm. Possible additional soft tissue in the left perihilar region (series 403/image 56). Mild interlobular septal thickening in the bilateral upper lobes, with associated mild ground-glass opacity, possibly reflecting mild interstitial edema. Small bilateral pleural effusions, right greater than left. No pneumothorax. Upper abdomen: Visualized upper abdomen is unremarkable. Musculoskeletal: Degenerative changes of the thoracolumbar spine. Bilateral shoulder arthroplasties. Review of the MIP images confirms the above findings. IMPRESSION: Motion degraded images. No evidence of pulmonary embolism. Suspected mild interstitial edema. Additional small bilateral pleural effusions, right greater than left. Patchy/nodular right lower lobe opacity, including a possible dominant 1.4 x 2.2 cm right lower lobe nodule. Overall, it is unclear to what extent this appearance reflects infection, atelectasis, or possibly tumor, as these findings are not well evaluated on the current study in the acute setting. Consider follow-up CT chest in 4-6 weeks after appropriate antimicrobial therapy. Additional scattered bilateral pulmonary nodules, including a dominant 7 x 11 mm nodule in the central left lower lobe. These can be evaluated at the time of follow-up. Electronically Signed   By: Julian Hy M.D.   On: 05/03/2016 18:26   EKG:  Independently reviewed. Atrial fibrillation  Assessment/Plan Principal Problem:   Atrial fibrillation, new onset (HCC) Active Problems:   Essential hypertension   Rheumatoid arthritis (HCC)   Abnormal chest CT   Acute respiratory failure with hypoxia ([place])  New onset atrial fibrillation with acute hypoxia and new oxygen requirement --Admit to telemetry --Serial troponin --Check TFTs --Anticoagulate with unfractionated heparin infusion for now (CHADS-Vasc score at least four based on age, gender, HTN) --Will stop her home dose of amlodipine and give oral diltiazem 30mg  po q6h for now --She is already on Toprol XL 50mg  po BID at home, will continue this dose for now --Will continue baby aspirin for now, particularly while ruling  out ACS as cause for new onset atrial fibrillation --Complete echo in the AM --Will ultimately need cardiology referral and plan for long-term anticoagulation --Wean oxygen as tolerated, will likely need PT/OT evals when appropriate  Abnormal chest CT, CAP vs mass, with presence of pulmonary nodules as well --She will need a routine pulmonary consultation in the morning to determine timing of repeat imaging of her chest as well as indications for bronchoscopy --Empiric antibiotics for now, blood cultures ordered in the ED --She is demonstrating a new oxygen requirement now; wean as tolerated  History of RA --Continue home doses of prednisone and plaquenil for now  HTN --Continue home doses of metoprolol and enalapril for now.  Amlodipine replaced by diltiazem for now, as noted above  DVT prophylaxis: Anticoagulated with IV heparin infusion  Code Status: FULL Family Communication: Patient's daughter Jan at bedside at time of admission.   Disposition Plan: Expect her to be here at least two midnights, based on symptoms and acute findings Consults called: NONE, Routine pulmonary consult will need to be called in the AM Admission status: Inpatient,  telemetry   Eber Jones MD [facility]  If 7PM-7AM, please contact night-coverage [email or web address] Password [facility]  05/03/2016, 8:17 PM    Of note, interstitial edema noted on chest CT.  Will give low dose IV lasix one time now, particularly since patient has new oxygen requirement and is still symptomatic with exertion.

## 2016-05-03 NOTE — ED Notes (Signed)
Pt up to bedpan for 300 cc. Pt has to roll to change linen and becomes shob with movement. Pt is going to Ct and sat is 90%. Placedc on 2l/[place].

## 2016-05-03 NOTE — ED Notes (Signed)
Attempted to call report x 1  

## 2016-05-03 NOTE — Progress Notes (Signed)
ANTICOAGULATION CONSULT NOTE - Initial Consult  Pharmacy Consult for heparin Indication: atrial fibrillation  Allergies  Allergen Reactions  . Other Nausea And Vomiting    Pt states she is very sensitive to pain medications.    Patient Measurements:   Heparin Dosing Weight: 64kg  Vital Signs: Temp: 98 F (36.7 C) (05/22 1532) Temp Source: Oral (05/22 1532) BP: 141/73 mmHg (05/22 1900) Pulse Rate: 101 (05/22 1900)  Labs:  Recent Labs  05/03/16 1554  HGB 12.6  HCT 39.0  PLT 219  CREATININE 0.84    CrCl cannot be calculated (Unknown ideal weight.).   Medical History: Past Medical History  Diagnosis Date  . Hypertension   . Vertigo   . RA (rheumatoid arthritis) ([place])   . RA (rheumatoid arthritis) (HCC)     Medications:  Infusions:  . azithromycin (ZITHROMAX) 500 MG IVPB    . cefTRIAXone (ROCEPHIN)  IV    . heparin      Assessment: 4 yof presented to the ED with SOB. Found to be in afib and now starting heparin. Baseline CBC is WNL. She is not on anticoagulation PTA.   Goal of Therapy:  Heparin level 0.3-0.7 units/ml Monitor platelets by anticoagulation protocol: Yes   Plan:  - Heparin bolus 3500 units IV x 1 - Heparin gtt 900 units/hr - Check an 8 hour heparin level - Daily heparin level and CBC  [name], [name] 05/03/2016,7:35 PM

## 2016-05-03 NOTE — ED Notes (Signed)
Attempted report x 3.  

## 2016-05-03 NOTE — ED Provider Notes (Signed)
CSN: [id number]     Arrival date & time 05/03/16  1452 History   First MD Initiated Contact with Patient 05/03/16 1528     Chief Complaint  Patient presents with  . Shortness of Breath     (Consider location/radiation/quality/duration/timing/severity/associated sxs/prior Treatment) The history is provided by the patient.  [name] is a 82 y.o. female hx of HTN, rheumatoid arthritis, Who presented with shortness of breath. Shortness of breath started 3 days ago that was intermittent. States that it is worse when she walks around. This morning, she noted that the shortness breath is constant. She also had some palpitations as well but denies any chest pain. She denies any recent travel or any leg swelling. She has a history of rheumatoid arthritis and is on plaquenil. No history of CAD or cardiac stents    Past Medical History  Diagnosis Date  . Hypertension   . Vertigo   . RA (rheumatoid arthritis) ([place])   . RA (rheumatoid arthritis) [facility])    Past Surgical History  Procedure Laterality Date  . Replacement total knee Right   . Shoulder surgery Bilateral   . Cervical spine surgery     History reviewed. No pertinent family history. Social History  Substance Use Topics  . Smoking status: Former Research scientist (life sciences)  . Smokeless tobacco: None  . Alcohol Use: No   OB History    No data available     Review of Systems  Respiratory: Positive for shortness of breath.   All other systems reviewed and are negative.     Allergies  Other  Home Medications   Prior to Admission medications   Medication Sig Start Date End Date Taking? Authorizing Provider  amLODipine (NORVASC) 10 MG tablet Take 10 mg by mouth daily.     Yes Historical Provider, MD  aspirin 81 MG tablet Take 81 mg by mouth daily.   Yes Historical Provider, MD  calcium carbonate (OS-CAL) 600 MG TABS Take 600 mg by mouth 2 (two) times daily with a meal.     Yes Historical Provider, MD  cholecalciferol (VITAMIN D) 1000  UNITS tablet Take 2,500 Units by mouth daily.    Yes Historical Provider, MD  docusate sodium (COLACE) 100 MG capsule Take 200 mg by mouth every evening.   Yes Historical Provider, MD  enalapril (VASOTEC) 20 MG tablet Take 20 mg by mouth 2 (two) times daily.     Yes Historical Provider, MD  hydroxychloroquine (PLAQUENIL) 200 MG tablet Take 200 mg by mouth 2 (two) times daily.     Yes Historical Provider, MD  loratadine (CLARITIN) 10 MG tablet Take 10 mg by mouth daily.   Yes Historical Provider, MD  Magnesium 250 MG TABS Take 250 mg by mouth daily.     Yes Historical Provider, MD  metoprolol (TOPROL-XL) 50 MG 24 hr tablet Take 50 mg by mouth 2 (two) times daily.     Yes Historical Provider, MD  Omega-3 Fatty Acids (FISH OIL) 1000 MG CAPS Take 1 capsule by mouth every evening.    Yes Historical Provider, MD  predniSONE (DELTASONE) 1 MG tablet Take 4 mg by mouth daily with breakfast.   Yes Historical Provider, MD  vitamin C (ASCORBIC ACID) 500 MG tablet Take 500 mg by mouth daily.     Yes Historical Provider, MD  Wheat Dextrin (BENEFIBER PO) Take 4 scoop by mouth daily.    Yes Historical Provider, MD   BP 141/73 mmHg  Pulse 101  Temp(Src) 98 F (  36.7 C) (Oral)  Resp 18  SpO2 98% Physical Exam  Constitutional: She is oriented to person, place, and time. She appears well-developed and well-nourished.  HENT:  Head: Normocephalic.  Mouth/Throat: Oropharynx is clear and moist.  Eyes: Conjunctivae are normal. Pupils are equal, round, and reactive to light.  Neck: Normal range of motion. Neck supple.  Cardiovascular: Normal heart sounds.   Irregular, tachycardic   Pulmonary/Chest: Effort normal and breath sounds normal. No respiratory distress. She has no wheezes. She has no rales.  Abdominal: Soft. Bowel sounds are normal. She exhibits no distension. There is no tenderness. There is no rebound.  Musculoskeletal: Normal range of motion. She exhibits no edema or tenderness.  Neurological: She is  alert and oriented to person, place, and time. No cranial nerve deficit. Coordination normal.  Skin: Skin is warm and dry.  Psychiatric: She has a normal mood and affect. Her behavior is normal. Judgment and thought content normal.  Nursing note and vitals reviewed.   ED Course  Procedures (including critical care time)  CRITICAL CARE Performed by: Darl Householder, Kwamane Whack   Total critical care time: 30 minutes  Critical care time was exclusive of separately billable procedures and treating other patients.  Critical care was necessary to treat or prevent imminent or life-threatening deterioration.  Critical care was time spent personally by me on the following activities: development of treatment plan with patient and/or surrogate as well as nursing, discussions with consultants, evaluation of patient's response to treatment, examination of patient, obtaining history from patient or surrogate, ordering and performing treatments and interventions, ordering and review of laboratory studies, ordering and review of radiographic studies, pulse oximetry and re-evaluation of patient's condition.   Labs Review Labs Reviewed  COMPREHENSIVE METABOLIC PANEL - Abnormal; Notable for the following:    Sodium 134 (*)    Chloride 99 (*)    Glucose, Bld 117 (*)    Total Protein 5.9 (*)    All other components within normal limits  CULTURE, BLOOD (ROUTINE X 2)  CULTURE, BLOOD (ROUTINE X 2)  CBC WITH DIFFERENTIAL/PLATELET  I-STAT TROPOININ, ED  I-STAT CG4 LACTIC ACID, ED    Imaging Review Dg Chest 2 View  05/03/2016  CLINICAL DATA:  Shortness of breath onset today. EXAM: CHEST  2 VIEW COMPARISON:  11/03/2014 FINDINGS: Lungs are adequately inflated with opacification over the right middle lobe and lung bases. Findings likely due to infection. Small amount bilateral pleural fluid right greater than left. Cardiomediastinal silhouette is within normal. There is calcified plaque over the aortic arch. There are  degenerative changes of the spine Lynne with biphasic curvature of the thoracolumbar spine unchanged. Bilateral shoulder prostheses unchanged. In fusion hardware over the cervical is intact. IMPRESSION: Airspace process over the right middle lobe and lung bases likely pneumonia. Small amount of bilateral pleural fluid right greater than left. Electronically Signed   By: Marin Olp M.D.   On: 05/03/2016 16:36   Ct Angio Chest Pe W/cm &/or Wo Cm  05/03/2016  CLINICAL DATA:  Shortness of breath, evaluate for PE EXAM: CT ANGIOGRAPHY CHEST WITH CONTRAST TECHNIQUE: Multidetector CT imaging of the chest was performed using the standard protocol during bolus administration of intravenous contrast. Multiplanar CT image reconstructions and MIPs were obtained to evaluate the vascular anatomy. CONTRAST:  80 mL Isovue 370 IV COMPARISON:  Chest radiographs dated 05/03/2016 FINDINGS: No evidence of pulmonary embolism. Mediastinum/Nodes: The heart is top-normal in size. No pericardial effusion. Coronary atherosclerosis in the LAD. Atherosclerotic calcifications of the  aortic arch. No suspicious mediastinal lymphadenopathy. Visualized thyroid is unremarkable. Lungs/Pleura: Patchy/nodular right lower lobe opacity (series 403/images 80 and 86). Dominant central 1.4 x 2.2 cm nodule (series 403/image 79) with adjacent tree-in-bud nodularity (series 403/image 76). Overall, it is unclear to what extent this appearance reflect infection versus atelectasis, with tumor not excluded. Additional 7 x 11 mm nodule in the central left lower lobe (series 403/image 76), as well as additional nodularity in the posterior right upper lobe (series 403/image 30 and 36) measuring up to 6 mm. Possible additional soft tissue in the left perihilar region (series 403/image 56). Mild interlobular septal thickening in the bilateral upper lobes, with associated mild ground-glass opacity, possibly reflecting mild interstitial edema. Small bilateral pleural  effusions, right greater than left. No pneumothorax. Upper abdomen: Visualized upper abdomen is unremarkable. Musculoskeletal: Degenerative changes of the thoracolumbar spine. Bilateral shoulder arthroplasties. Review of the MIP images confirms the above findings. IMPRESSION: Motion degraded images. No evidence of pulmonary embolism. Suspected mild interstitial edema. Additional small bilateral pleural effusions, right greater than left. Patchy/nodular right lower lobe opacity, including a possible dominant 1.4 x 2.2 cm right lower lobe nodule. Overall, it is unclear to what extent this appearance reflects infection, atelectasis, or possibly tumor, as these findings are not well evaluated on the current study in the acute setting. Consider follow-up CT chest in 4-6 weeks after appropriate antimicrobial therapy. Additional scattered bilateral pulmonary nodules, including a dominant 7 x 11 mm nodule in the central left lower lobe. These can be evaluated at the time of follow-up. Electronically Signed   By: Julian Hy M.D.   On: 05/03/2016 18:26   I have personally reviewed and evaluated these images and lab results as part of my medical decision-making.   EKG Interpretation   Date/Time:  Monday May 03 2016 15:28:50 EDT Ventricular Rate:  114 PR Interval:    QRS Duration: 94 QT Interval:  356 QTC Calculation: 490 R Axis:   60 Text Interpretation:  Atrial fibrillation Borderline low voltage,  extremity leads Borderline prolonged QT interval No previous ECGs  available Confirmed by Severa Jeremiah  MD, Zniyah Midkiff ([id number]) on 05/03/2016 3:45:12 PM      MDM   Final diagnoses:  None   [name] is a 82 y.o. female here with shortness of breath, palpitations. Has new onset afib. Consider PE vs ACS. Will get labs, CT angio, trop. Will give cardizem and reassess. Symptoms more than 24 hrs and patient not on anticoagulation so is not a candidate for cardioversion.   7:13 PM Patient's HR improved to 85-100  with cardizem 10 mg IV. Didn't require drip. She desat to 90% with just using the bedpan. Placed on 2 L [place]. Patient's CT showed possible pneumonia vs mass. Not much cough or fever and no leukocytosis. Given abx empirically. Started on heparin for anticoagulation for high CHADVAS score as I anticipate that she may need biopsy of the lung mass so hesitate to give oral anticoagulants     Wandra Arthurs, MD 05/03/16 [phone]

## 2016-05-04 ENCOUNTER — Encounter (HOSPITAL_COMMUNITY): Payer: Self-pay | Admitting: Family

## 2016-05-04 ENCOUNTER — Inpatient Hospital Stay (HOSPITAL_COMMUNITY): Payer: PPO

## 2016-05-04 ENCOUNTER — Other Ambulatory Visit: Payer: Self-pay | Admitting: Interventional Cardiology

## 2016-05-04 ENCOUNTER — Other Ambulatory Visit (HOSPITAL_COMMUNITY): Payer: PPO

## 2016-05-04 DIAGNOSIS — M069 Rheumatoid arthritis, unspecified: Secondary | ICD-10-CM

## 2016-05-04 DIAGNOSIS — J9811 Atelectasis: Secondary | ICD-10-CM

## 2016-05-04 DIAGNOSIS — R911 Solitary pulmonary nodule: Secondary | ICD-10-CM

## 2016-05-04 DIAGNOSIS — J9601 Acute respiratory failure with hypoxia: Secondary | ICD-10-CM

## 2016-05-04 DIAGNOSIS — R946 Abnormal results of thyroid function studies: Secondary | ICD-10-CM

## 2016-05-04 DIAGNOSIS — I10 Essential (primary) hypertension: Secondary | ICD-10-CM | POA: Diagnosis not present

## 2016-05-04 DIAGNOSIS — I4891 Unspecified atrial fibrillation: Secondary | ICD-10-CM

## 2016-05-04 DIAGNOSIS — R938 Abnormal findings on diagnostic imaging of other specified body structures: Secondary | ICD-10-CM | POA: Diagnosis not present

## 2016-05-04 LAB — BRAIN NATRIURETIC PEPTIDE: B Natriuretic Peptide: 254 pg/mL — ABNORMAL HIGH (ref 0.0–100.0)

## 2016-05-04 LAB — ECHOCARDIOGRAM COMPLETE
Height: 61 in
WEIGHTICAEL: 2428.8 [oz_av]

## 2016-05-04 LAB — CBC
HCT: 36.5 % (ref 36.0–46.0)
Hemoglobin: 11.7 g/dL — ABNORMAL LOW (ref 12.0–15.0)
MCH: 29.8 pg (ref 26.0–34.0)
MCHC: 32.1 g/dL (ref 30.0–36.0)
MCV: 93.1 fL (ref 78.0–100.0)
PLATELETS: 215 10*3/uL (ref 150–400)
RBC: 3.92 MIL/uL (ref 3.87–5.11)
RDW: 13.1 % (ref 11.5–15.5)
WBC: 7.2 10*3/uL (ref 4.0–10.5)

## 2016-05-04 LAB — BASIC METABOLIC PANEL
Anion gap: 8 (ref 5–15)
BUN: 9 mg/dL (ref 6–20)
CHLORIDE: 100 mmol/L — AB (ref 101–111)
CO2: 28 mmol/L (ref 22–32)
Calcium: 8.7 mg/dL — ABNORMAL LOW (ref 8.9–10.3)
Creatinine, Ser: 0.72 mg/dL (ref 0.44–1.00)
GFR calc Af Amer: >60 mL/min (ref >60)
GFR calc non Af Amer: >60 mL/min (ref >60)
Glucose, Bld: 119 mg/dL — ABNORMAL HIGH (ref 65–99)
POTASSIUM: 3.5 mmol/L (ref 3.5–5.1)
SODIUM: 136 mmol/L (ref 135–145)

## 2016-05-04 LAB — HEPARIN LEVEL (UNFRACTIONATED)
Heparin Unfractionated: 0.75 IU/mL — ABNORMAL HIGH (ref 0.30–0.70)
Heparin Unfractionated: 0.68 IU/mL (ref 0.30–0.70)

## 2016-05-04 LAB — TROPONIN I
Troponin I: <0.03 ng/mL (ref <0.031)
Troponin I: <0.03 ng/mL (ref <0.031)

## 2016-05-04 LAB — LIPID PANEL
CHOLESTEROL: 160 mg/dL (ref 0–200)
HDL: 66 mg/dL (ref >40)
LDL Cholesterol: 81 mg/dL (ref 0–99)
TRIGLYCERIDES: 64 mg/dL (ref <150)
Total CHOL/HDL Ratio: 2.4 RATIO
VLDL: 13 mg/dL (ref 0–40)

## 2016-05-04 LAB — T4, FREE: Free T4: 1.46 ng/dL — ABNORMAL HIGH (ref 0.61–1.12)

## 2016-05-04 LAB — TSH: TSH: 1.522 u[IU]/mL (ref 0.350–4.500)

## 2016-05-04 MED ORDER — RIVAROXABAN 20 MG PO TABS
20.0000 mg | ORAL_TABLET | Freq: Every day | ORAL | Status: DC
  Administered 2016-05-04: 20 mg via ORAL
  Filled 2016-05-04: qty 1

## 2016-05-04 MED ORDER — BENEPROTEIN PO POWD
4.0000 | Freq: Every day | ORAL | Status: DC
  Filled 2016-05-04: qty 227

## 2016-05-04 MED ORDER — CETYLPYRIDINIUM CHLORIDE 0.05 % MT LIQD
7.0000 mL | Freq: Two times a day (BID) | OROMUCOSAL | Status: DC
  Administered 2016-05-04 – 2016-05-06 (×5): 7 mL via OROMUCOSAL

## 2016-05-04 MED ORDER — BENEFIBER PO POWD: 4.0000 | Freq: Every day | ORAL | Status: DC

## 2016-05-04 MED ORDER — FUROSEMIDE 10 MG/ML IJ SOLN
20.0000 mg | Freq: Once | INTRAMUSCULAR | Status: AC
  Administered 2016-05-04: 20 mg via INTRAVENOUS
  Filled 2016-05-04: qty 2

## 2016-05-04 NOTE — Progress Notes (Signed)
[facility] PROGRESS NOTE  [name] [id number] DOB: 07-17-1928 DOA: 05/03/2016  PCP: Katherine Coma, MD  Brief HPI: 82 year old Caucasian female with a past medical history of hypertension and rheumatoid arthritis and vertigo presented with complaints of shortness of breath. Evaluation revealed new onset atrial fibrillation. CT angiogram of the chest did not reveal PE. However, multiple pulmonary nodules were noted. Patient was hospitalized for further management.  Past medical history:  Past Medical History  Diagnosis Date  . Hypertension   . Vertigo   . RA (rheumatoid arthritis) ([place])   . RA (rheumatoid arthritis) [facility])     Consultants: Cardiology. Pulmonology  Procedures:  Transthoracic echocardiogram is pending  Antibiotics: Ceftriaxone and azithromycin 5/22  Subjective: Patient continues to be short of breath this morning. She does have a cough. Denies any chest pain per se. No nausea or vomiting. Her daughter is at the bedside.  Objective:  Vital Signs  Filed Vitals:   05/03/16 1930 05/03/16 1945 05/03/16 2130 05/03/16 2331  BP: 147/93 159/96 159/79 163/94  Pulse: 81 101 87 86  Temp:   97.8 F (36.6 C)   TempSrc:   Oral   Resp:  23    Height:   5\' 1"  (1.549 m)   Weight:   68.856 kg (151 lb 12.8 oz)   SpO2: 99% 98% 97%     Intake/Output Summary (Last 24 hours) at 05/04/16 1258 Last data filed at 05/04/16 1155  Gross per 24 hour  Intake    240 ml  Output   3175 ml  Net  -2935 ml   Filed Weights   05/03/16 2130  Weight: 68.856 kg (151 lb 12.8 oz)    General appearance: alert, cooperative, appears stated age and no distress Resp: Crackles noted bilateral bases, right more than left. No wheezing or rhonchi. Cardio: S1, S2 is irregularly irregular. No S3, S4. Systolic murmur appreciated over the precordium. GI: soft, non-tender; bowel sounds normal; no masses,  no organomegaly Extremities: extremities normal, atraumatic, no cyanosis  or edema Neurologic: Awake and alert. Oriented 3. No focal neurological deficits are noted.  Lab Results:  Data Reviewed: I have personally reviewed following labs and imaging studies  CBC:  Recent Labs Lab 05/03/16 1554 05/04/16 0303  WBC 8.3 7.2  NEUTROABS 7.0  --   HGB 12.6 11.7*  HCT 39.0 36.5  MCV 93.8 93.1  PLT 219 123456   Basic Metabolic Panel:  Recent Labs Lab 05/03/16 1554 05/04/16 0303  NA 134* 136  K 4.2 3.5  CL 99* 100*  CO2 26 28  GLUCOSE 117* 119*  BUN 12 9  CREATININE 0.84 0.72  CALCIUM 9.5 8.7*   GFR: Estimated Creatinine Clearance: 44.8 mL/min (by C-G formula based on Cr of 0.72).  Liver Function Tests:  Recent Labs Lab 05/03/16 1554  AST 23  ALT 23  ALKPHOS 51  BILITOT 1.0  PROT 5.9*  ALBUMIN 3.6   Coagulation Profile:  Recent Labs Lab 05/03/16 2209  INR 1.23   Cardiac Enzymes:  Recent Labs Lab 05/03/16 2209 05/04/16 0303 05/04/16 0818  TROPONINI <0.03 <0.03 <0.03   Lipid Profile:  Recent Labs  05/04/16 0303  CHOL 160  HDL 66  LDLCALC 81  TRIG 64  CHOLHDL 2.4   Thyroid Function Tests:  Recent Labs  05/03/16 2205 05/03/16 2209  TSH 0.829  --   FREET4  --  1.44*     Radiology Studies: Dg Chest 2 View  05/03/2016  CLINICAL DATA:  Shortness  of breath onset today. EXAM: CHEST  2 VIEW COMPARISON:  11/03/2014 FINDINGS: Lungs are adequately inflated with opacification over the right middle lobe and lung bases. Findings likely due to infection. Small amount bilateral pleural fluid right greater than left. Cardiomediastinal silhouette is within normal. There is calcified plaque over the aortic arch. There are degenerative changes of the spine [name] with biphasic curvature of the thoracolumbar spine unchanged. Bilateral shoulder prostheses unchanged. In fusion hardware over the cervical is intact. IMPRESSION: Airspace process over the right middle lobe and lung bases likely pneumonia. Small amount of bilateral pleural  fluid right greater than left. Electronically Signed   By: Marin Olp M.D.   On: 05/03/2016 16:36   Ct Angio Chest Pe W/cm &/or Wo Cm  05/03/2016  CLINICAL DATA:  Shortness of breath, evaluate for PE EXAM: CT ANGIOGRAPHY CHEST WITH CONTRAST TECHNIQUE: Multidetector CT imaging of the chest was performed using the standard protocol during bolus administration of intravenous contrast. Multiplanar CT image reconstructions and MIPs were obtained to evaluate the vascular anatomy. CONTRAST:  80 mL Isovue 370 IV COMPARISON:  Chest radiographs dated 05/03/2016 FINDINGS: No evidence of pulmonary embolism. Mediastinum/Nodes: The heart is top-normal in size. No pericardial effusion. Coronary atherosclerosis in the LAD. Atherosclerotic calcifications of the aortic arch. No suspicious mediastinal lymphadenopathy. Visualized thyroid is unremarkable. Lungs/Pleura: Patchy/nodular right lower lobe opacity (series 403/images 80 and 86). Dominant central 1.4 x 2.2 cm nodule (series 403/image 79) with adjacent tree-in-bud nodularity (series 403/image 76). Overall, it is unclear to what extent this appearance reflect infection versus atelectasis, with tumor not excluded. Additional 7 x 11 mm nodule in the central left lower lobe (series 403/image 76), as well as additional nodularity in the posterior right upper lobe (series 403/image 30 and 36) measuring up to 6 mm. Possible additional soft tissue in the left perihilar region (series 403/image 56). Mild interlobular septal thickening in the bilateral upper lobes, with associated mild ground-glass opacity, possibly reflecting mild interstitial edema. Small bilateral pleural effusions, right greater than left. No pneumothorax. Upper abdomen: Visualized upper abdomen is unremarkable. Musculoskeletal: Degenerative changes of the thoracolumbar spine. Bilateral shoulder arthroplasties. Review of the MIP images confirms the above findings. IMPRESSION: Motion degraded images. No evidence  of pulmonary embolism. Suspected mild interstitial edema. Additional small bilateral pleural effusions, right greater than left. Patchy/nodular right lower lobe opacity, including a possible dominant 1.4 x 2.2 cm right lower lobe nodule. Overall, it is unclear to what extent this appearance reflects infection, atelectasis, or possibly tumor, as these findings are not well evaluated on the current study in the acute setting. Consider follow-up CT chest in 4-6 weeks after appropriate antimicrobial therapy. Additional scattered bilateral pulmonary nodules, including a dominant 7 x 11 mm nodule in the central left lower lobe. These can be evaluated at the time of follow-up. Electronically Signed   By: Julian Hy M.D.   On: 05/03/2016 18:26     Medications:  Scheduled: . aspirin EC  81 mg Oral Daily  . azithromycin  250 mg Oral Daily  . calcium carbonate  1,250 mg Oral BID WC  . cefTRIAXone (ROCEPHIN)  IV  1 g Intravenous Q24H  . cholecalciferol  2,500 Units Oral Daily  . diltiazem  30 mg Oral Q6H  . docusate sodium  200 mg Oral QPM  . enalapril  20 mg Oral BID  . hydroxychloroquine  200 mg Oral BID  . loratadine  10 mg Oral Daily  . magnesium oxide  200 mg Oral  Daily  . metoprolol succinate  50 mg Oral BID  . predniSONE  4 mg Oral Q breakfast  . sodium chloride flush  3 mL Intravenous Q12H   Continuous: . heparin 800 Units/hr (05/04/16 0515)   [id number] chloride, acetaminophen, ondansetron (ZOFRAN) IV, sodium chloride flush  Assessment/Plan:  Principal Problem:   Atrial fibrillation, new onset ([place]) Active Problems:   Essential hypertension   Rheumatoid arthritis ([place])   Abnormal chest CT   Acute respiratory failure with hypoxia ([place])    New onset atrial fibrillation Patient's heart rate is reasonably well controlled. She is on oral Cardizem. She is also on beta blocker. She is on aspirin. She was started on IV heparin. Echocardiogram is pending. Chads 2 vascular score is  at least 4. Cardiology has been consulted. LDL is 81.  Dyspnea with abnormal CT chest/possible community-acquired pneumonia CT scan of the chest raised concern for multiple pulmonary nodules. Patient is being empirically treated for infection with ceftriaxone and azithromycin.. She does have a history of rheumatoid arthritis. Etiology for her findings is not entirely clear. Consult pulmonology. Oxygen as needed.  Essential hypertension Monitor blood pressures closely. Continue home medications. Amlodipine has been held as she is now on diltiazem for atrial fibrillation.  Rheumatoid arthritis Continue her home medications. Patient is noted to be on hydroxychloroquine as well as prednisone.  Abnormal thyroid function tests. TSH is normal. Free T4 was noted to be slightly elevated. Significance of these findings is not entirely clear. We will check free T3. We'll repeat TSH and free T4. With a normal TSH, hyperthyroidism seems to be less likely. But we will wait on repeat studies before deciding further management.   DVT Prophylaxis: IV heparin    Code Status: Full code  Family Communication: Discussed with the patient and her daughter  Disposition Plan: Management as outlined above.    LOS: 1 day   [facility] Pager [phone] 05/04/2016, 12:58 PM  If 7PM-7AM, please contact night-coverage at [email or web address], password [facility]

## 2016-05-04 NOTE — Progress Notes (Addendum)
[place] for heparin --> Xarelto Indication: atrial fibrillation  Allergies  Allergen Reactions  . Other Nausea And Vomiting    Pt states she is very sensitive to pain medications.    Patient Measurements: Height: 5\' 1"  (154.9 cm) Weight: 151 lb 12.8 oz (68.856 kg) IBW/kg (Calculated) : 47.8 Heparin Dosing Weight: 64kg  Vital Signs:    Labs:  Recent Labs  05/03/16 1554 05/03/16 2209 05/04/16 0303 05/04/16 0818 05/04/16 1237  HGB 12.6  --  11.7*  --   --   HCT 39.0  --  36.5  --   --   PLT 219  --  215  --   --   LABPROT  --  15.6*  --   --   --   INR  --  1.23  --   --   --   HEPARINUNFRC  --   --  0.75*  --  0.68  CREATININE 0.84  --  0.72  --   --   TROPONINI  --  <0.03 <0.03 <0.03  --     Estimated Creatinine Clearance: 44.8 mL/min (by C-G formula based on Cr of 0.72).   Assessment: 88 yof on heparin for afib. Heparin level therapeutic on the low end. No bleeding noted.   Goal of Therapy:  Heparin level 0.3-0.7 units/ml Monitor platelets by anticoagulation protocol: Yes    Plan:  - Decrease heparin gtt to 750 units/hr - Check an 8 hour heparin level - Daily HL, CBC   Hughes Better, PharmD, BCPS Clinical Pharmacist 05/04/2016 3:19 PM   Addendum: Pharmacy now asked to transition patient to [place]. Using total body weight, CrCl is ~62ml/min so will use 20mg  daily. Case management consult ordered for cost. Will follow up with pharmacist education.  Nena Jordan, PharmD, BCPS 05/04/2016, 3:25 PM  Addendum 5/24 -Reduced dose to 15 mg po daily due to CrCl 40-45 ml/min  Harvel Quale  05/05/2016 2:35 PM

## 2016-05-04 NOTE — Consult Note (Signed)
Name: [name] MRN: [id number] DOB: [birth year]    ADMISSION DATE:  05/03/2016 CONSULTATION DATE:  05/04/16  REFERRING MD :  Dr. Maryland Pink   CHIEF COMPLAINT:  SOB, Abnormal CT Chest > pleural effusions, RLL atelectasis  HISTORY OF PRESENT ILLNESS:  82 y/o F with PMH of vertigo, HTN, RA on plaquenil, cervical spine surgery and R total knee replacement who presented to [facility] on 5/22 with complaints of shortness of breath.    The patient reports she had been SOB x 3 days that was progressive.  Initially symptoms were intermittent but worsened.  She activated EMS on 5/22 for worsening SOB and palpitations.  Upon evaluation, she was noted to be in atrial fibrillation.  The patient denied chest pain on admission, travel and lower extremity swelling.  Initial rhythm noted her rate to be in the 110's.  Evaluation notable for negative troponin, CXR suggestive of RML PNA.  She denied fever, cough, sputum production or evidence of infection.  CTA of the chest was evaluated which was negative for PE but showed RLL atelectasis vs infiltrate, pulmonary nodules (LUL, RLL).  She was treated with IV heparin for AF.  Cardiology was consulted for evaluation.  PCCM called to evaluate pulmonary nodules, effusion and RLL airspace disease.    PAST MEDICAL HISTORY :   has a past medical history of Hypertension; Vertigo; RA (rheumatoid arthritis) ([place]); and RA (rheumatoid arthritis) ([place]).  has past surgical history that includes Replacement total knee (Right); Shoulder surgery (Bilateral); and Cervical spine surgery.   Prior to Admission medications   Medication Sig Start Date End Date Taking? Authorizing Provider  amLODipine (NORVASC) 10 MG tablet Take 10 mg by mouth daily.     Yes Historical Provider, MD  aspirin 81 MG tablet Take 81 mg by mouth daily.   Yes Historical Provider, MD  calcium carbonate (OS-CAL) 600 MG TABS Take 600 mg by mouth 2 (two) times daily with a meal.     Yes Historical Provider, MD    cholecalciferol (VITAMIN D) 1000 UNITS tablet Take 2,500 Units by mouth daily.    Yes Historical Provider, MD  docusate sodium (COLACE) 100 MG capsule Take 200 mg by mouth every evening.   Yes Historical Provider, MD  enalapril (VASOTEC) 20 MG tablet Take 20 mg by mouth 2 (two) times daily.     Yes Historical Provider, MD  hydroxychloroquine (PLAQUENIL) 200 MG tablet Take 200 mg by mouth 2 (two) times daily.     Yes Historical Provider, MD  loratadine (CLARITIN) 10 MG tablet Take 10 mg by mouth daily.   Yes Historical Provider, MD  Magnesium 250 MG TABS Take 250 mg by mouth daily.     Yes Historical Provider, MD  metoprolol (TOPROL-XL) 50 MG 24 hr tablet Take 50 mg by mouth 2 (two) times daily.     Yes Historical Provider, MD  Omega-3 Fatty Acids (FISH OIL) 1000 MG CAPS Take 1 capsule by mouth every evening.    Yes Historical Provider, MD  predniSONE (DELTASONE) 1 MG tablet Take 4 mg by mouth daily with breakfast.   Yes Historical Provider, MD  vitamin C (ASCORBIC ACID) 500 MG tablet Take 500 mg by mouth daily.     Yes Historical Provider, MD  Wheat Dextrin (BENEFIBER PO) Take 4 scoop by mouth daily.    Yes Historical Provider, MD   Allergies  Allergen Reactions  . Other Nausea And Vomiting    Pt states she is very sensitive to pain medications.  FAMILY HISTORY:  family history is not on file.   SOCIAL HISTORY:  reports that she has quit smoking. She does not have any smokeless tobacco history on file. She reports that she does not drink alcohol or use illicit drugs.  REVIEW OF SYSTEMS:  POSITIVES IN BOLD Constitutional: Negative for fever, chills, weight loss, malaise/fatigue and diaphoresis.  HENT: Negative for hearing loss, ear pain, nosebleeds, congestion, sore throat, neck pain, tinnitus and ear discharge.   Eyes: Negative for blurred vision, double vision, photophobia, pain, discharge and redness.  Respiratory: Negative for cough, hemoptysis, sputum production, shortness of  breath, wheezing and stridor.   Cardiovascular: Negative for chest pain, palpitations, orthopnea, claudication, leg swelling and PND.  Gastrointestinal: Negative for heartburn, nausea, vomiting, abdominal pain, diarrhea, constipation, blood in stool and melena.  Genitourinary: Negative for dysuria, urgency, frequency, hematuria and flank pain.  Musculoskeletal: Negative for myalgias, back pain, joint pain and falls.  Skin: Negative for itching and rash.  Neurological: Negative for dizziness, tingling, tremors, sensory change, speech change, focal weakness, seizures, loss of consciousness, weakness and headaches.  Endo/Heme/Allergies: Negative for environmental allergies and polydipsia. Does not bruise/bleed easily.  SUBJECTIVE:   VITAL SIGNS: Temp:  [97.8 F (36.6 C)-98 F (36.7 C)] 97.8 F (36.6 C) (05/22 2130) Pulse Rate:  [65-111] 86 (05/22 2331) Resp:  [17-29] 23 (05/22 1945) BP: (126-163)/(73-105) 163/94 mmHg (05/22 2331) SpO2:  [95 %-99 %] 97 % (05/22 2130) Weight:  [151 lb 12.8 oz (68.856 kg)] 151 lb 12.8 oz (68.856 kg) (05/22 2130)  PHYSICAL EXAMINATION: General:  Elderly female in NAD  Neuro:  AAOx4, speech clear, MAE  HEENT:  MM pink/moist, no jvd  Cardiovascular:  s1s2 irr irr, no m/r/g  Lungs:  Even/non-labored, lungs bilaterally with basilar crackles  Abdomen:  Soft/non-tender, bsx4 active  Musculoskeletal:  No acute deformities, swan-neck deformity of hands  Skin:  Warm/dry, no edema, rashes or lesions   Recent Labs Lab 05/03/16 1554 05/04/16 0303  NA 134* 136  K 4.2 3.5  CL 99* 100*  CO2 26 28  BUN 12 9  CREATININE 0.84 0.72  GLUCOSE 117* 119*    Recent Labs Lab 05/03/16 1554 05/04/16 0303  HGB 12.6 11.7*  HCT 39.0 36.5  WBC 8.3 7.2  PLT 219 215   Dg Chest 2 View  05/03/2016  CLINICAL DATA:  Shortness of breath onset today. EXAM: CHEST  2 VIEW COMPARISON:  11/03/2014 FINDINGS: Lungs are adequately inflated with opacification over the right middle  lobe and lung bases. Findings likely due to infection. Small amount bilateral pleural fluid right greater than left. Cardiomediastinal silhouette is within normal. There is calcified plaque over the aortic arch. There are degenerative changes of the spine Lynne with biphasic curvature of the thoracolumbar spine unchanged. Bilateral shoulder prostheses unchanged. In fusion hardware over the cervical is intact. IMPRESSION: Airspace process over the right middle lobe and lung bases likely pneumonia. Small amount of bilateral pleural fluid right greater than left. Electronically Signed   By: Marin Olp M.D.   On: 05/03/2016 16:36   Ct Angio Chest Pe W/cm &/or Wo Cm  05/03/2016  CLINICAL DATA:  Shortness of breath, evaluate for PE EXAM: CT ANGIOGRAPHY CHEST WITH CONTRAST TECHNIQUE: Multidetector CT imaging of the chest was performed using the standard protocol during bolus administration of intravenous contrast. Multiplanar CT image reconstructions and MIPs were obtained to evaluate the vascular anatomy. CONTRAST:  80 mL Isovue 370 IV COMPARISON:  Chest radiographs dated 05/03/2016 FINDINGS: No evidence  of pulmonary embolism. Mediastinum/Nodes: The heart is top-normal in size. No pericardial effusion. Coronary atherosclerosis in the LAD. Atherosclerotic calcifications of the aortic arch. No suspicious mediastinal lymphadenopathy. Visualized thyroid is unremarkable. Lungs/Pleura: Patchy/nodular right lower lobe opacity (series 403/images 80 and 86). Dominant central 1.4 x 2.2 cm nodule (series 403/image 79) with adjacent tree-in-bud nodularity (series 403/image 76). Overall, it is unclear to what extent this appearance reflect infection versus atelectasis, with tumor not excluded. Additional 7 x 11 mm nodule in the central left lower lobe (series 403/image 76), as well as additional nodularity in the posterior right upper lobe (series 403/image 30 and 36) measuring up to 6 mm. Possible additional soft tissue in the  left perihilar region (series 403/image 56). Mild interlobular septal thickening in the bilateral upper lobes, with associated mild ground-glass opacity, possibly reflecting mild interstitial edema. Small bilateral pleural effusions, right greater than left. No pneumothorax. Upper abdomen: Visualized upper abdomen is unremarkable. Musculoskeletal: Degenerative changes of the thoracolumbar spine. Bilateral shoulder arthroplasties. Review of the MIP images confirms the above findings. IMPRESSION: Motion degraded images. No evidence of pulmonary embolism. Suspected mild interstitial edema. Additional small bilateral pleural effusions, right greater than left. Patchy/nodular right lower lobe opacity, including a possible dominant 1.4 x 2.2 cm right lower lobe nodule. Overall, it is unclear to what extent this appearance reflects infection, atelectasis, or possibly tumor, as these findings are not well evaluated on the current study in the acute setting. Consider follow-up CT chest in 4-6 weeks after appropriate antimicrobial therapy. Additional scattered bilateral pulmonary nodules, including a dominant 7 x 11 mm nodule in the central left lower lobe. These can be evaluated at the time of follow-up. Electronically Signed   By: Julian Hy M.D.   On: 05/03/2016 18:26   SIGNIFICANT EVENTS  5/22  Admit with SOB, palpitations.  Found to have AF   STUDIES:  5/22  CTA Chest >> motion degraded images, no PE, mild interstitial edema, small bilateral effusions R>L, patchy nodular RLL opacity  ASSESSMENT / PLAN:  Attending Note:  82 year old female with history of RA on plaquenil who presents to the hospital with SOB.  CTA was performed and patient was noted to have pleural effusion, pulmonary infiltrate and pulmonary nodule.  PCCM was called on consultation.  On exam, bibasilar crackles and some dullness to percussion at the bases.  I reviewed CT myself, right basilar atelectasis much more likely than mass with  evidence of pulmonary edema.  Discussed with TR[facility]-MD.    Pulmonary nodule: at the right middle lung range.  - No interventions at this time, discussed with patient at length, she does not wish any procedures for diagnosis.  - Repeat CT in 6 weeks only if patient is curious, otherwise would not perform anything further.  Atelectasis at the bases due to pleural effusion and kyphosis.  - IS per RT protocol.  - Mobilize.  SOB: due to pulmonary edema.  - Lasix as ordered.  - 2D echo ordered.  - BNP ordered.  - CXR intermittently.  Hypoxemia: due to pulmonary edema.  - Titrate O2 for sat of 88-92%.  - Ambulatory desaturation study prior to discharge to evaluate for home O2.  PCCM will sign off, please call back if needed.  Patient seen and examined, agree with above note.  I dictated the care and orders written for this patient under my direction.  Rush Farmer, MD [phone]

## 2016-05-04 NOTE — Progress Notes (Signed)
*  PRELIMINARY RESULTS* Echocardiogram 2D Echocardiogram has been performed.  Leavy Cella 05/04/2016, 3:32 PM

## 2016-05-04 NOTE — Care Management Note (Signed)
Case Management Note Marvetta Gibbons RN, BSN Unit 2W-Case Manager [phone]  Patient Details  Name: [name] MRN: [id number] Date of Birth: Jul 31, 1928  Subjective/Objective:     Pt admitted with afib, ?PNA- IV abx               Action/Plan: PTA pt lived at [place] apartment- anticipated return to [place] apartment- pt has been started on Xarelto- referral received for insurance check on benefit coverage- Pt copay will be $ 45-prior auth not required - spoke with pt at bedside- coverage info given- pt states she normally uses mail order- 30 day free card given to pt to use at local pharmacy on discharge- pt will need script for both 30 day free card- no refills- and one to send into her mail order with refills. No further CM needs noted at this time- will follow.   Expected Discharge Date:                  Expected Discharge Plan:  Home/Self Care  In-House Referral:     Discharge planning Services  CM Consult, Medication Assistance  Post Acute Care Choice:    Choice offered to:     DME Arranged:    DME Agency:     HH Arranged:    HH Agency:     Status of Service:  In process, will continue to follow  Medicare Important Message Given:    Date Medicare IM Given:    Medicare IM give by:    Date Additional Medicare IM Given:    Additional Medicare Important Message give by:     If discussed at [place] of Stay Meetings, dates discussed:    Additional Comments:  Dawayne Patricia, RN 05/04/2016, 3:59 PM

## 2016-05-04 NOTE — Consult Note (Signed)
   [facility] CM Inpatient Consult   05/04/2016  [place] 01/29/1928 [id number]  Patient was screened for services.    Met with the patient and daughter at bedside regarding the benefits of [facility] Care Management services.  Explained that [place] [facility] is a covered benefit of [employer] insurance. Review information for [facility] Care Management and a folder was provided with contact information.  Explained that [place] [facility] does not interfere with or replace any services arranged by the inpatient care management staff.  Patient states she was happy to hear of the services but, she would like to get back to her residence and find out what services she has at her [place] residence.  She is in independent living and she has to find out about their transportation because she had sold her car and not planning on driving anymore.  Patient did accept a brochure with contact information in it.  For questions, please contact:  Natividad Brood, RN BSN [facility] Liaison  [phone] business mobile phone Toll free office [phone]

## 2016-05-04 NOTE — Progress Notes (Signed)
Insurance check completed on Xarelto Pt copay will be $ 45-prior auth not required   CM will give pt 30 day free card to use on discharge.

## 2016-05-04 NOTE — Progress Notes (Signed)
Utilization review completed.  

## 2016-05-04 NOTE — Clinical Documentation Improvement (Signed)
Hospitalist Please update your documentation within the medical record to reflect your response to this query. Thank you  Can the diagnosis of "Pulmonary Edema" be further specified?  Acute Pulmonary Edema  Chronic Pulmonary Edema  CHF:   Acuity - Acute, Chronic, Acute on Chronic   Type - Systolic, Diastolic, Systolic and Diastolic  Other  Clinically Undetermined  Document any associated diagnoses/conditions  Supporting Information: 05/04/16 consult note..."SOB: due to pulmonary edema.- Lasix as ordered.- 2D echo ordered.- BNP ordered.- CXR intermittently"...  Please exercise your independent, professional judgment when responding. A specific answer is not anticipated or expected.  Thank You,  Ermelinda Das, RN, BSN, [place] Certified Clinical Documentation Specialist [place]: Health Information Management [phone]

## 2016-05-04 NOTE — Progress Notes (Signed)
ANTICOAGULATION CONSULT NOTE - Follow-up Consult  Pharmacy Consult for heparin Indication: atrial fibrillation  Allergies  Allergen Reactions  . Other Nausea And Vomiting    Pt states she is very sensitive to pain medications.    Patient Measurements: Height: 5\' 1"  (154.9 cm) Weight: 151 lb 12.8 oz (68.856 kg) IBW/kg (Calculated) : 47.8 Heparin Dosing Weight: 64kg  Vital Signs: Temp: 97.8 F (36.6 C) (05/22 2130) Temp Source: Oral (05/22 2130) BP: 163/94 mmHg (05/22 2331) Pulse Rate: 86 (05/22 2331)  Labs:  Recent Labs  05/03/16 1554 05/03/16 2209 05/04/16 0303  HGB 12.6  --  11.7*  HCT 39.0  --  36.5  PLT 219  --  215  LABPROT  --  15.6*  --   INR  --  1.23  --   HEPARINUNFRC  --   --  0.75*  CREATININE 0.84  --   --   TROPONINI  --  <0.03  --     Estimated Creatinine Clearance: 42.7 mL/min (by C-G formula based on Cr of 0.84).   Assessment: 88 yof on heparin for afib. Heparin level slightly supratherapeutic on 900 units/hr. No bleeding noted.  Goal of Therapy:  Heparin level 0.3-0.7 units/ml Monitor platelets by anticoagulation protocol: Yes   Plan:  - Decrease heparin gtt to 800 units/hr - Check an 8 hour heparin level  Sherlon Handing, PharmD, BCPS Clinical pharmacist, pager [phone] 05/04/2016,3:54 AM

## 2016-05-04 NOTE — Consult Note (Signed)
Name: [name] Lamb is a 82 y.o. female Admit date: 05/03/2016 Referring Physician:  Dr. Eulas Lamb Primary Physician:  Dr. [place] Lamb Primary Cardiologist:  none  Reason for Consultation:  Afib with RVR  ASSESSMENT: 82 y/o F w/ PMHx of HTN, vertigo, TIA, and RA who presented with SOB and DOE found to have a RML PNA, small b/l pleural effusions, and new onset afib. She denies hx of chest palpitations aside from 1-2 episodes she attributes to hot flashes and chest pain. Per chart review she had a prior dx of afib in 2005 and hx of TIA in 2012. ECHO in 2012 w/ evidence of left atrial enlargement. Her CHA2DS2-VASc Score and unadjusted Ischemic Stroke Rate (% per year) is equal to 11.2 % stroke rate/year from a score of 7 (CHF, HTN, age >51, age>59, female, TIA)   PLAN: 1. Paroxysmal Atrial fibrillation --TSH and trops x 3 nl. EKG in 2015 revealed sinus rhythm now in afib w/ HR 93 possibly driven by underlying infectious etiology however her CHA2DS2-VASc Score 7. Her renal function is nl. Discussed risks and benefits of AC including GI bleed as she is on chronic prednisone. She understands risks and benefits and would like to start on a DOAC vs coumadin which her husband was on.   - continue po cardizem, consider transitioning to long acting po cardizem 120mg  tomorrow. - continue toprol xl 50mg  BID - ECHO ordered  - start on xarelto 20mg  qd  2. Rheumatoid arthritis -- on chronic steroids and plaquenil. Plaquenil can cause cardiotoxicity, conduction abnormalities, and QTc prolongation.  - avoid QTc prolongating medications -- caution w/ azrithomycin   3. HTN-- norvasc was stopped and she was started on po cardizem 30mg  q6h. Continue ACEinh and toprol XL .   4. Diastolic cardiac dysfunction -- EF 55-60% w/ grade 2 diastolic dysfunction in 0000000. Continue ACEinh and BB. Follow up ECHO results. BNP pending.    HPI: Ms. [name] is an 82 y/o F w/ PMHx of HTN, vertigo, TIA (2012) and rheumatoid  arthritis (on plaquenil and prednisone) who presented on 5/22 with shortness of breath and DOE found to be hypoxic in the ED requiring supplemental oxygen. CXR revealed RML pneumonia, CTA negative for PE but positive for 2 pulmonary nodules, questionable RLL mass vs infection, and small b/l pleural effusions. She was started on azithromycin and rocephin. She was noted to have new onset afib and she was then started on oral cardizem and on IV heparin. She does not have any chest pain or palpitations. Pt denies personal and family hx of cardiac disease. She does not drink EtOH or use illicit drugs. She is a former 1PPD smoker x 15 years but quit greater than 50 years ago. She states she may have had 1-2 episodes of chest palpitations when she was Lamb menopausal due to hot flashes. She denies hx of stroke but per chart review she presented to the ED in 2012 with acute onset left sided weakness, facial droop, and confusion. She lives at an ALF and denies recent falls.     She denies cardiac work up but per chart review she had a pre op myoview prior to back sx in 2005 that was nl. She also had a pre operative ECHO in 2005 that mentions afib as a diagnosis w/ EF of 60% and mild MVP involving the anterior leaflet. She had an ECHO in 2012 w/ EF 0000000, grade 2 diastolic dysfunction, mild left atrial enlargement, and mild mitral regurgitation.   PMH:  Past Medical History  Diagnosis Date  . Hypertension   . Vertigo   . RA (rheumatoid arthritis) ([place])   . RA (rheumatoid arthritis) ([facility])     PSH:   Past Surgical History  Procedure Laterality Date  . Replacement total knee Right   . Shoulder surgery Bilateral   . Cervical spine surgery     Allergies:  Other Prior to Admit Meds:   Prescriptions prior to admission  Medication Sig Dispense Refill Last Dose  . amLODipine (NORVASC) 10 MG tablet Take 10 mg by mouth daily.     05/03/2016 at Unknown time  . aspirin 81 MG tablet Take 81 mg by mouth daily.    05/02/2016 at Unknown time  . calcium carbonate (OS-CAL) 600 MG TABS Take 600 mg by mouth 2 (two) times daily with a meal.     05/03/2016 at Unknown time  . cholecalciferol (VITAMIN D) 1000 UNITS tablet Take 2,500 Units by mouth daily.    05/03/2016 at Unknown time  . docusate sodium (COLACE) 100 MG capsule Take 200 mg by mouth every evening.   05/02/2016 at Unknown time  . enalapril (VASOTEC) 20 MG tablet Take 20 mg by mouth 2 (two) times daily.     05/03/2016 at Unknown time  . hydroxychloroquine (PLAQUENIL) 200 MG tablet Take 200 mg by mouth 2 (two) times daily.     05/03/2016 at Unknown time  . loratadine (CLARITIN) 10 MG tablet Take 10 mg by mouth daily.   05/03/2016 at Unknown time  . Magnesium 250 MG TABS Take 250 mg by mouth daily.     05/02/2016 at Unknown time  . metoprolol (TOPROL-XL) 50 MG 24 hr tablet Take 50 mg by mouth 2 (two) times daily.     05/03/2016 at 1000  . Omega-3 Fatty Acids (FISH OIL) 1000 MG CAPS Take 1 capsule by mouth every evening.    05/02/2016 at Unknown time  . predniSONE (DELTASONE) 1 MG tablet Take 4 mg by mouth daily with breakfast.   05/03/2016 at Unknown time  . vitamin C (ASCORBIC ACID) 500 MG tablet Take 500 mg by mouth daily.     05/03/2016 at Unknown time  . Wheat Dextrin (BENEFIBER PO) Take 4 scoop by mouth daily.    05/03/2016 at Unknown time   Fam HX:   History reviewed. No pertinent family history. Social HX:    Social History   Social History  . Marital Status: Widowed    Spouse Name: N/A  . Number of Children: N/A  . Years of Education: N/A   Occupational History  . Not on file.   Social History Main Topics  . Smoking status: Former Research scientist (life sciences)  . Smokeless tobacco: Not on file  . Alcohol Use: No  . Drug Use: No  . Sexual Activity: Not on file   Other Topics Concern  . Not on file   Social History Narrative     Review of Systems: - diarrhea, nausea, vomiting, fevers, chills, chest pain, chest palpitations, dysuria, GI ulcer/bleeding, GERD +  shortness of breath  All other systems are negative.  Physical Exam: Blood pressure 163/94, pulse 86, temperature 97.8 F (36.6 C), temperature source Oral, resp. rate 23, height 5\' 1"  (1.549 m), weight 151 lb 12.8 oz (68.856 kg), SpO2 97 %. Weight change:   Physical Exam  Constitutional: She is oriented to person, place, and time. She appears well-developed and well-nourished. No distress.  HENT:  Head: Normocephalic and atraumatic.  Nose: Nose normal.  Eyes:  Conjunctivae and EOM are normal. No scleral icterus.  Neck: Normal range of motion. Neck supple. JVD present.  Cardiovascular: Normal heart sounds.  Exam reveals no gallop and no friction rub.   No murmur heard. Pulmonary/Chest: Effort normal. No respiratory distress. She has no wheezes. She has no rales.  Decreased breath sounds on RLL, neg for crackles   Abdominal: Soft. Bowel sounds are normal. She exhibits no distension and no mass. There is no tenderness. There is no rebound and no guarding.  Musculoskeletal: She exhibits no edema.  Neurological: She is alert and oriented to person, place, and time. No cranial nerve deficit.  Skin: She is not diaphoretic.   Labs: Lab Results  Component Value Date   WBC 7.2 05/04/2016   HGB 11.7* 05/04/2016   HCT 36.5 05/04/2016   MCV 93.1 05/04/2016   PLT 215 05/04/2016    Recent Labs Lab 05/03/16 1554 05/04/16 0303  NA 134* 136  K 4.2 3.5  CL 99* 100*  CO2 26 28  BUN 12 9  CREATININE 0.84 0.72  CALCIUM 9.5 8.7*  PROT 5.9*  --   BILITOT 1.0  --   ALKPHOS 51  --   ALT 23  --   AST 23  --   GLUCOSE 117* 119*   No results found for: PTT Lab Results  Component Value Date   INR 1.23 05/03/2016   INR 1.02 11/03/2014   INR 0.96 01/26/2011   Lab Results  Component Value Date   CKTOTAL 81 01/26/2011   CKMB 2.5 01/26/2011   TROPONINI <0.03 05/04/2016     Lab Results  Component Value Date   CHOL 160 05/04/2016   CHOL  01/27/2011    150        ATP III  CLASSIFICATION:  <200     mg/dL   Desirable  200-239  mg/dL   Borderline High  >=240    mg/dL   High          Lab Results  Component Value Date   HDL 66 05/04/2016   HDL 66 01/27/2011   Lab Results  Component Value Date   LDLCALC 81 05/04/2016   LDLCALC  01/27/2011    66        Total Cholesterol/HDL:CHD Risk Coronary Heart Disease Risk Table                     Men   Women  1/2 Average Risk   3.4   3.3  Average Risk       5.0   4.4  2 X Average Risk   9.6   7.1  3 X Average Risk  23.4   11.0        Use the calculated Patient Ratio above and the CHD Risk Table to determine the patient's CHD Risk.        ATP III CLASSIFICATION (LDL):  <100     mg/dL   Optimal  100-129  mg/dL   Near or Above                    Optimal  130-159  mg/dL   Borderline  160-189  mg/dL   High  >190     mg/dL   Very High   Lab Results  Component Value Date   TRIG 64 05/04/2016   TRIG 90 01/27/2011   Lab Results  Component Value Date   CHOLHDL 2.4 05/04/2016   CHOLHDL 2.3 01/27/2011  No results found for: LDLDIRECT    Radiology:  Dg Chest 2 View  05/03/2016  CLINICAL DATA:  Shortness of breath onset today. EXAM: CHEST  2 VIEW COMPARISON:  11/03/2014 FINDINGS: Lungs are adequately inflated with opacification over the right middle lobe and lung bases. Findings likely due to infection. Small amount bilateral pleural fluid right greater than left. Cardiomediastinal silhouette is within normal. There is calcified plaque over the aortic arch. There are degenerative changes of the spine Lynne with biphasic curvature of the thoracolumbar spine unchanged. Bilateral shoulder prostheses unchanged. In fusion hardware over the cervical is intact. IMPRESSION: Airspace process over the right middle lobe and lung bases likely pneumonia. Small amount of bilateral pleural fluid right greater than left. Electronically Signed   By: Marin Olp M.D.   On: 05/03/2016 16:36   Ct Angio Chest Pe W/cm &/or Wo  Cm  05/03/2016  CLINICAL DATA:  Shortness of breath, evaluate for PE EXAM: CT ANGIOGRAPHY CHEST WITH CONTRAST TECHNIQUE: Multidetector CT imaging of the chest was performed using the standard protocol during bolus administration of intravenous contrast. Multiplanar CT image reconstructions and MIPs were obtained to evaluate the vascular anatomy. CONTRAST:  80 mL Isovue 370 IV COMPARISON:  Chest radiographs dated 05/03/2016 FINDINGS: No evidence of pulmonary embolism. Mediastinum/Nodes: The heart is top-normal in size. No pericardial effusion. Coronary atherosclerosis in the LAD. Atherosclerotic calcifications of the aortic arch. No suspicious mediastinal lymphadenopathy. Visualized thyroid is unremarkable. Lungs/Pleura: Patchy/nodular right lower lobe opacity (series 403/images 80 and 86). Dominant central 1.4 x 2.2 cm nodule (series 403/image 79) with adjacent tree-in-bud nodularity (series 403/image 76). Overall, it is unclear to what extent this appearance reflect infection versus atelectasis, with tumor not excluded. Additional 7 x 11 mm nodule in the central left lower lobe (series 403/image 76), as well as additional nodularity in the posterior right upper lobe (series 403/image 30 and 36) measuring up to 6 mm. Possible additional soft tissue in the left perihilar region (series 403/image 56). Mild interlobular septal thickening in the bilateral upper lobes, with associated mild ground-glass opacity, possibly reflecting mild interstitial edema. Small bilateral pleural effusions, right greater than left. No pneumothorax. Upper abdomen: Visualized upper abdomen is unremarkable. Musculoskeletal: Degenerative changes of the thoracolumbar spine. Bilateral shoulder arthroplasties. Review of the MIP images confirms the above findings. IMPRESSION: Motion degraded images. No evidence of pulmonary embolism. Suspected mild interstitial edema. Additional small bilateral pleural effusions, right greater than left.  Patchy/nodular right lower lobe opacity, including a possible dominant 1.4 x 2.2 cm right lower lobe nodule. Overall, it is unclear to what extent this appearance reflects infection, atelectasis, or possibly tumor, as these findings are not well evaluated on the current study in the acute setting. Consider follow-up CT chest in 4-6 weeks after appropriate antimicrobial therapy. Additional scattered bilateral pulmonary nodules, including a dominant 7 x 11 mm nodule in the central left lower lobe. These can be evaluated at the time of follow-up. Electronically Signed   By: Julian Hy M.D.   On: 05/03/2016 18:26    EKG:  Afib, VR 114, QTc interval 490    Julious Oka 05/04/2016 12:54 PM  Patient seen and examined and history reviewed. Agree with above findings and plan. Very pleasant 82 yo WF admitted with acute PNA. Found to be in Afib with RVR. Rate now controlled on diltiazem and metoprolol. She is asymptomatic from a rhythm standpoint. No chest pain. She has a [place] vasc score of 7. Echo is pending. At this point  would continue rate control strategy. She needs long term anticoagulant therapy. Recommend a DOAC. Would stop ASA. Avoid NSAIDS. Since she is on chronic steroids I would recommend GI prophylaxis with a PPI. Will follow up with you.  Anish Vana [place], [place] 05/04/2016 2:48 PM

## 2016-05-05 DIAGNOSIS — R938 Abnormal findings on diagnostic imaging of other specified body structures: Secondary | ICD-10-CM | POA: Diagnosis not present

## 2016-05-05 DIAGNOSIS — I4891 Unspecified atrial fibrillation: Secondary | ICD-10-CM | POA: Diagnosis not present

## 2016-05-05 DIAGNOSIS — J9601 Acute respiratory failure with hypoxia: Secondary | ICD-10-CM | POA: Diagnosis not present

## 2016-05-05 DIAGNOSIS — I10 Essential (primary) hypertension: Secondary | ICD-10-CM | POA: Diagnosis not present

## 2016-05-05 LAB — MAGNESIUM: Magnesium: 2 mg/dL (ref 1.7–2.4)

## 2016-05-05 LAB — BASIC METABOLIC PANEL
ANION GAP: 9 (ref 5–15)
BUN: 9 mg/dL (ref 6–20)
CALCIUM: 9.1 mg/dL (ref 8.9–10.3)
CO2: 28 mmol/L (ref 22–32)
CREATININE: 0.87 mg/dL (ref 0.44–1.00)
Chloride: 97 mmol/L — ABNORMAL LOW (ref 101–111)
GFR, EST NON AFRICAN AMERICAN: 59 mL/min — AB (ref >60)
GLUCOSE: 100 mg/dL — AB (ref 65–99)
POTASSIUM: 3.7 mmol/L (ref 3.5–5.1)
SODIUM: 134 mmol/L — AB (ref 135–145)

## 2016-05-05 LAB — CBC
HCT: 38.5 % (ref 36.0–46.0)
Hemoglobin: 12.5 g/dL (ref 12.0–15.0)
MCH: 30.3 pg (ref 26.0–34.0)
MCHC: 32.5 g/dL (ref 30.0–36.0)
MCV: 93.4 fL (ref 78.0–100.0)
PLATELETS: 212 10*3/uL (ref 150–400)
RBC: 4.12 MIL/uL (ref 3.87–5.11)
RDW: 13.1 % (ref 11.5–15.5)
WBC: 6.8 10*3/uL (ref 4.0–10.5)

## 2016-05-05 LAB — T3, FREE: T3, Free: 2.6 pg/mL (ref 2.0–4.4)

## 2016-05-05 MED ORDER — LEVALBUTEROL HCL 1.25 MG/0.5ML IN NEBU
1.2500 mg | INHALATION_SOLUTION | Freq: Three times a day (TID) | RESPIRATORY_TRACT | Status: DC | PRN
  Administered 2016-05-07: 1.25 mg via RESPIRATORY_TRACT
  Filled 2016-05-05: qty 0.5

## 2016-05-05 MED ORDER — RIVAROXABAN 15 MG PO TABS
15.0000 mg | ORAL_TABLET | Freq: Every day | ORAL | Status: DC
  Administered 2016-05-05 – 2016-05-06 (×2): 15 mg via ORAL
  Filled 2016-05-05 (×2): qty 1

## 2016-05-05 MED ORDER — PSYLLIUM 95 % PO PACK
1.0000 | PACK | Freq: Every day | ORAL | Status: DC
  Administered 2016-05-05 – 2016-05-07 (×3): 1 via ORAL
  Filled 2016-05-05 (×3): qty 1

## 2016-05-05 MED ORDER — DILTIAZEM HCL ER COATED BEADS 120 MG PO CP24
120.0000 mg | ORAL_CAPSULE | Freq: Every day | ORAL | Status: DC
  Administered 2016-05-05 – 2016-05-07 (×3): 120 mg via ORAL
  Filled 2016-05-05 (×3): qty 1

## 2016-05-05 NOTE — Care Management Important Message (Signed)
Important Message  Patient Details  Name: [name] MRN: [id number] Date of Birth: February 19, 1928   Medicare Important Message Given:  Yes    Loann Quill 05/05/2016, 11:14 AM

## 2016-05-05 NOTE — Evaluation (Signed)
Physical Therapy Evaluation Patient Details Name: [name] MRN: [id number] DOB: 1928/04/23 Today's Date: 05/05/2016   History of Present Illness  Pt adm with SOB and found to have afib with RVR and PNA vs mass. Pt/family opted for no further work-up on possible mass. PMH - multiple bil shoulder surgeries, RA, HTN, vertigo  Clinical Impression  Pt doing well with mobility and no further PT needed.  Ready for dc from PT standpoint. SpO2 99% on RA with amb.      Follow Up Recommendations No PT follow up    Equipment Recommendations  None recommended by PT    Recommendations for Other Services       Precautions / Restrictions Precautions Precautions: None      Mobility  Bed Mobility Overal bed mobility: Modified Independent                Transfers Overall transfer level: Modified independent Equipment used: 4-wheeled walker                Ambulation/Gait Ambulation/Gait assistance: Modified independent (Device/Increase time) Ambulation Distance (Feet): 350 Feet Assistive device: 4-wheeled walker Gait Pattern/deviations: Step-through pattern;Decreased stride length   Gait velocity interpretation: at or above normal speed for age/gender General Gait Details: Steady gait with rollator. SpO2 99% on RA with amb  Stairs            Wheelchair Mobility    Modified Rankin (Stroke Patients Only)       Balance Overall balance assessment: Needs assistance Sitting-balance support: No upper extremity supported;Feet supported Sitting balance-Leahy Scale: Normal     Standing balance support: No upper extremity supported;During functional activity Standing balance-Leahy Scale: Good                               Pertinent Vitals/Pain Pain Assessment: No/denies pain    Home Living Family/patient expects to be discharged to:: Private residence ([facility]) Living Arrangements: Alone   Type of Home: Independent living  facility Home Access: Level entry;Elevator     Home Layout: One level Home Equipment: Environmental consultant - 4 wheels;Cane - single point      Prior Function Level of Independence: Independent with assistive device(s)         Comments: Uses rollator except uses cane in room     Hand Dominance        Extremity/Trunk Assessment   Upper Extremity Assessment: RUE deficits/detail;LUE deficits/detail RUE Deficits / Details: Decr function and ROM of shoulders due to multiple surgeries     LUE Deficits / Details: Decr function and ROM of shoulders due to multiple surgeries   Lower Extremity Assessment: Overall WFL for tasks assessed         Communication   Communication: HOH  Cognition Arousal/Alertness: Awake/alert Behavior During Therapy: WFL for tasks assessed/performed Overall Cognitive Status: Within Functional Limits for tasks assessed                      General Comments      Exercises        Assessment/Plan    PT Assessment [place] does not need any further PT services  PT Diagnosis Difficulty walking   PT Problem List    PT Treatment Interventions     PT Goals (Current goals can be found in the Care Plan section) Acute Rehab PT Goals PT Goal Formulation: All assessment and education complete, DC therapy    Frequency  Barriers to discharge        Co-evaluation               End of Session   Activity Tolerance: Patient tolerated treatment well Patient left: in chair;with call bell/phone within reach;with family/visitor present Nurse Communication: Mobility status (SpO2 and that O2 left off)         Time: 1045-1106 PT Time Calculation (min) (ACUTE ONLY): 21 min   Charges:   PT Evaluation $PT Eval Low Complexity: 1 Procedure     PT G Codes:        Margarit Minshall 2016/05/17, 11:21 AM Emmabelle Fear PT [phone]

## 2016-05-05 NOTE — Progress Notes (Signed)
Patient Name: [name] Date of Encounter: 05/05/2016  Primary Cardiologist: New Patient Profile: 82 y/o F w/ PMHx of HTN, vertigo, TIA, and RA who presented with SOB and DOE found to have a RML PNA, small b/l pleural effusions, and new onset afib. She denies hx of chest palpitations aside from 1-2 episodes she attributes to hot flashes and chest pain. Per chart review she had a prior dx of afib in 2005 and hx of TIA in 2012. ECHO in 2012 w/ evidence of left atrial enlargement.  SUBJECTIVE: Feels well, no chest pain or SOB. Says that she feels like she is breathing better.    OBJECTIVE Filed Vitals:   05/03/16 2331 05/04/16 2043 05/05/16 0430 05/05/16 0440  BP: 163/94 139/67 151/92   Pulse: 86 76 84   Temp:  98.2 F (36.8 C) 97.9 F (36.6 C)   TempSrc:  Oral Oral   Resp:  20 20   Height:      Weight:    152 lb 5.4 oz (69.1 kg)  SpO2:  98% 99%     Intake/Output Summary (Last 24 hours) at 05/05/16 0804 Last data filed at 05/05/16 0400  Gross per 24 hour  Intake    480 ml  Output   2400 ml  Net  -1920 ml   Filed Weights   05/03/16 2130 05/05/16 0440  Weight: 151 lb 12.8 oz (68.856 kg) 152 lb 5.4 oz (69.1 kg)    PHYSICAL EXAM General: Well developed, well nourished, female in no acute distress. Head: Normocephalic, atraumatic.  Neck: Supple without bruits, no JVD. Lungs:  Resp regular and unlabored, Diminished in bases.  Heart: RRR, S1, S2, no S3, S4, or murmur; no rub. Abdomen: Soft, non-tender, non-distended, BS + x 4.  Extremities: No clubbing, cyanosis, No edema.  Neuro: Alert and oriented X 3. Moves all extremities spontaneously. Psych: Normal affect.  LABS: CBC: Recent Labs  05/03/16 1554 05/04/16 0303 05/05/16 0444  WBC 8.3 7.2 6.8  NEUTROABS 7.0  --   --   HGB 12.6 11.7* 12.5  HCT 39.0 36.5 38.5  MCV 93.8 93.1 93.4  PLT 219 215 212   INR: Recent Labs  05/03/16 2209  INR [id number]   Basic Metabolic Panel: Recent Labs  05/04/16 0303  05/05/16 0444  NA 136 134*  K 3.5 3.7  CL 100* 97*  CO2 28 28  GLUCOSE 119* 100*  BUN 9 9  CREATININE 0.72 0.87  CALCIUM 8.7* 9.1  MG  --  2.0   Liver Function Tests: Recent Labs  05/03/16 1554  AST 23  ALT 23  ALKPHOS 51  BILITOT 1.0  PROT 5.9*  ALBUMIN 3.6   Cardiac Enzymes: Recent Labs  05/03/16 2209 05/04/16 0303 05/04/16 0818  TROPONINI <0.03 <0.03 <0.03    Recent Labs  05/03/16 1543  TROPIPOC 0.00   BNP:  B NATRIURETIC PEPTIDE  Date/Time Value Ref Range Status  05/04/2016 02:26 PM 254.0* 0.0 - 100.0 pg/mL Final   Fasting Lipid Panel: Recent Labs  05/04/16 0303  CHOL 160  HDL 66  LDLCALC 81  TRIG 64  CHOLHDL 2.4   Thyroid Function Tests: Recent Labs  05/04/16 1237  TSH 1.522  T3FREE 2.6     Current facility-administered medications:  .  0.9 %  sodium chloride infusion, 250 mL, Intravenous, PRN, Lily Kocher, MD .  acetaminophen (TYLENOL) tablet 650 mg, 650 mg, Oral, Q4H PRN, Lily Kocher, MD .  antiseptic oral rinse ([place] /  CETYLPYRIDINIUM CHLORIDE 0.05%) solution 7 mL, 7 mL, Mouth Rinse, BID, Annita Brod, MD, 7 mL at 05/04/16 2200 .  aspirin EC tablet 81 mg, 81 mg, Oral, Daily, Lily Kocher, MD, 81 mg at 05/04/16 1026 .  azithromycin (ZITHROMAX) tablet 250 mg, 250 mg, Oral, Daily, Lily Kocher, MD, 250 mg at 05/04/16 1029 .  calcium carbonate (OS-CAL - dosed in mg of elemental calcium) tablet 1,250 mg, 1,250 mg, Oral, BID WC, Lily Kocher, MD, 1,250 mg at 05/05/16 [id number] .  cefTRIAXone (ROCEPHIN) 1 g in dextrose 5 % 50 mL IVPB, 1 g, Intravenous, Q24H, Lily Kocher, MD, 1 g at 05/04/16 2128 .  cholecalciferol (VITAMIN D) tablet 2,500 Units, 2,500 Units, Oral, Daily, Lily Kocher, MD, 2,500 Units at 05/04/16 1028 .  diltiazem (CARDIZEM) tablet 30 mg, 30 mg, Oral, Q6H, Lily Kocher, MD, 30 mg at 05/05/16 [phone] .  docusate sodium (COLACE) capsule 200 mg, 200 mg, Oral, QPM, Lily Kocher, MD, 200 mg at 05/04/16 1824 .  enalapril (VASOTEC)  tablet 20 mg, 20 mg, Oral, BID, Lily Kocher, MD, 20 mg at 05/04/16 2128 .  hydroxychloroquine (PLAQUENIL) tablet 200 mg, 200 mg, Oral, BID, Lily Kocher, MD, 200 mg at 05/04/16 2128 .  loratadine (CLARITIN) tablet 10 mg, 10 mg, Oral, Daily, Lily Kocher, MD, 10 mg at 05/04/16 1026 .  magnesium oxide (MAG-OX) tablet 200 mg, 200 mg, Oral, Daily, Lily Kocher, MD, 200 mg at 05/04/16 1027 .  metoprolol succinate (TOPROL-XL) 24 hr tablet 50 mg, 50 mg, Oral, BID, Lily Kocher, MD, 50 mg at 05/04/16 2128 .  ondansetron (ZOFRAN) injection 4 mg, 4 mg, Intravenous, Q6H PRN, Lily Kocher, MD .  predniSONE (DELTASONE) tablet 4 mg, 4 mg, Oral, Q breakfast, Lily Kocher, MD, 4 mg at 05/05/16 [phone] .  protein supplement (RESOURCE BENEPROTEIN) powder 24 g, 4 scoop, Oral, Daily, Bonnielee Haff, MD .  psyllium (HYDROCIL/METAMUCIL) packet 1 packet, 1 packet, Oral, Daily, Jeryl Columbia, NP .  rivaroxaban (XARELTO) tablet 20 mg, 20 mg, Oral, Q supper, Otilio Miu, RPH, 20 mg at 05/04/16 1545 .  sodium chloride flush (NS) 0.9 % injection 3 mL, 3 mL, Intravenous, Q12H, Lily Kocher, MD, 3 mL at 05/04/16 2200 .  sodium chloride flush (NS) 0.9 % injection 3 mL, 3 mL, Intravenous, PRN, Lily Kocher, MD   TELE:  Afib rate in 80-90's      ECG: Afib   Radiology/Studies: Dg Chest 2 View  05/03/2016  CLINICAL DATA:  Shortness of breath onset today. EXAM: CHEST  2 VIEW COMPARISON:  11/03/2014 FINDINGS: Lungs are adequately inflated with opacification over the right middle lobe and lung bases. Findings likely due to infection. Small amount bilateral pleural fluid right greater than left. Cardiomediastinal silhouette is within normal. There is calcified plaque over the aortic arch. There are degenerative changes of the spine Lynne with biphasic curvature of the thoracolumbar spine unchanged. Bilateral shoulder prostheses unchanged. In fusion hardware over the cervical is intact. IMPRESSION: Airspace process over the  right middle lobe and lung bases likely pneumonia. Small amount of bilateral pleural fluid right greater than left. Electronically Signed   By: Marin Olp M.D.   On: 05/03/2016 16:36   Ct Angio Chest Pe W/cm &/or Wo Cm  05/03/2016  CLINICAL DATA:  Shortness of breath, evaluate for PE EXAM: CT ANGIOGRAPHY CHEST WITH CONTRAST TECHNIQUE: Multidetector CT imaging of the chest was performed using the standard protocol during bolus administration of intravenous contrast. Multiplanar CT image reconstructions and MIPs  were obtained to evaluate the vascular anatomy. CONTRAST:  80 mL Isovue 370 IV COMPARISON:  Chest radiographs dated 05/03/2016 FINDINGS: No evidence of pulmonary embolism. Mediastinum/Nodes: The heart is top-normal in size. No pericardial effusion. Coronary atherosclerosis in the LAD. Atherosclerotic calcifications of the aortic arch. No suspicious mediastinal lymphadenopathy. Visualized thyroid is unremarkable. Lungs/Pleura: Patchy/nodular right lower lobe opacity (series 403/images 80 and 86). Dominant central 1.4 x 2.2 cm nodule (series 403/image 79) with adjacent tree-in-bud nodularity (series 403/image 76). Overall, it is unclear to what extent this appearance reflect infection versus atelectasis, with tumor not excluded. Additional 7 x 11 mm nodule in the central left lower lobe (series 403/image 76), as well as additional nodularity in the posterior right upper lobe (series 403/image 30 and 36) measuring up to 6 mm. Possible additional soft tissue in the left perihilar region (series 403/image 56). Mild interlobular septal thickening in the bilateral upper lobes, with associated mild ground-glass opacity, possibly reflecting mild interstitial edema. Small bilateral pleural effusions, right greater than left. No pneumothorax. Upper abdomen: Visualized upper abdomen is unremarkable. Musculoskeletal: Degenerative changes of the thoracolumbar spine. Bilateral shoulder arthroplasties. Review of the MIP  images confirms the above findings. IMPRESSION: Motion degraded images. No evidence of pulmonary embolism. Suspected mild interstitial edema. Additional small bilateral pleural effusions, right greater than left. Patchy/nodular right lower lobe opacity, including a possible dominant 1.4 x 2.2 cm right lower lobe nodule. Overall, it is unclear to what extent this appearance reflects infection, atelectasis, or possibly tumor, as these findings are not well evaluated on the current study in the acute setting. Consider follow-up CT chest in 4-6 weeks after appropriate antimicrobial therapy. Additional scattered bilateral pulmonary nodules, including a dominant 7 x 11 mm nodule in the central left lower lobe. These can be evaluated at the time of follow-up. Electronically Signed   By: Julian Hy M.D.   On: 05/03/2016 18:26     Current Medications:  . antiseptic oral rinse  7 mL Mouth Rinse BID  . aspirin EC  81 mg Oral Daily  . azithromycin  250 mg Oral Daily  . calcium carbonate  1,250 mg Oral BID WC  . cefTRIAXone (ROCEPHIN)  IV  1 g Intravenous Q24H  . cholecalciferol  2,500 Units Oral Daily  . diltiazem  30 mg Oral Q6H  . docusate sodium  200 mg Oral QPM  . enalapril  20 mg Oral BID  . hydroxychloroquine  200 mg Oral BID  . loratadine  10 mg Oral Daily  . magnesium oxide  200 mg Oral Daily  . metoprolol succinate  50 mg Oral BID  . predniSONE  4 mg Oral Q breakfast  . protein supplement  4 scoop Oral Daily  . psyllium  1 packet Oral Daily  . rivaroxaban  20 mg Oral Q supper  . sodium chloride flush  3 mL Intravenous Q12H      ASSESSMENT AND PLAN: Principal Problem:   Atrial fibrillation, new onset (HCC) Active Problems:   Essential hypertension   Rheumatoid arthritis (HCC)   Abnormal chest CT   Acute respiratory failure with hypoxia (HCC)  1. Paroxysmal atrial fibrillation: Patient presented with SOB and DOE. Chest x ray shows RML pneumonia, CTA negative for PE, but positive  for 2 pulmonary nodules and RLL mass vs. Infection. She was in Afib with RVR.  Rate controlled with diltiazem gtt initially, has transitioned to po diltiazem and metoprolol. Started Xarelto yesterday.   This patients CHA2DS2-VASc Score and unadjusted Ischemic Stroke  Rate (% per year) is equal to 11.2 % stroke rate/year from a score of 7 Above score calculated as 1 point each if present [CHF, HTN, DM, Vascular=MI/PAD/Aortic Plaque, Age if 65-74, or Female], 2 points each if present [Age > 75, or Stroke/TIA/TE]   Remains in Afib today, rate in 90's. BP stable. MD to advise on transitioning to long acting diltiazem today. Echo shows normal EF of 60-65%, mild LVH, no wall motion abnormalities.   2. Rheumatoid arthritis:  on chronic steroids and plaquenil. Plaquenil can cause cardiotoxicity, conduction abnormalities, and QTc prolongation. Avoid QTc prolongating medications, caution w/ azrithomycin.   3. HTN: Amlodipine was stopped and she was started on po cardizem 30mg  q6h. Continue ACE and metoprolol.   4. Diastolic cardiac dysfunction: Patient has history of grade 2 diastolic dysfunction, Echo done yesterday was not technically sufficient to allow evaluation of LV diastolic dysfunction.    Signed, Arbutus Leas , NP 8:04 AM 05/05/2016 Pager [phone] Patient seen and examined and history reviewed. Agree with above findings and plan. She is feeling much better. No complaints. Afib rate is controlled with ambulation. On Xarelto now. Echo looks good. Will switch to Cardizem CD 120 mg daily. Otherwise stable from cardiac standpoint. If she goes home today we can arrange outpatient follow up.  Tyshun Tuckerman [place], [place] 05/05/2016 11:40 AM

## 2016-05-05 NOTE — Progress Notes (Signed)
Patient ID: [name], female   DOB: 03/17/1928, 81 y.o.   MRN: [id number]    PROGRESS NOTE    [name]  [id number] DOB: Jun 12, 1928 DOA: 05/03/2016  PCP: Katherine Coma, MD   Brief Narrative:  82 year old Caucasian female with a past medical history of hypertension and rheumatoid arthritis, presented with complaints of shortness of breath. Evaluation revealed new onset atrial fibrillation. CT angiogram of the chest did not reveal PE. However, multiple pulmonary nodules, ? PNA noted. Cardiology and PCCM consulted for assistance. Patient was hospitalized for further management.  Assessment & Plan:   Paroxysmal atrial fibrillation, CHADS2 vasc score 6-7 - Rate controlled with diltiazem gtt initially, change to PO Cardizem 120 mg QD today  - currently on Metoprolol 50 mg BID PO - continue Xarelto   Chronic diastolic CHF - no signs of volume overload - weight 69 kg - patient with known grade 2 diastolic dysfunction - Echo done yesterday was not technically sufficient to allow evaluation of LV diastolic dysfunction  ? RML nodule, PNA vs atelectasis vs malignancy - imaging studies reviewed with pt and her daughter - pt very clear she is not interested in invasive interventions including biopsy and potential need for chemotx - family acknowledged that this was also discussed in detail with PCCM and conservative management with monitoring is preferred  - discussed with family that if pt wants, CT chest can be repeated in an outpatient setting in 4-6 weeks   Rheumatoid arthritis - on chronic steroids and plaquenil  HTN, essential  - Continue ACE and metoprolol - Cardizem changed to long acting 120 mg PO QD     DVT prophylaxis: Xarelto  Code Status: Full  Family Communication: Patient and daughter at bedside  Disposition Plan: Home in AM  Consultants:   Cardiology  PCCM  Procedures:   None  Antimicrobials:   Zithromax and Rocephin 5/22  -->   Subjective: Pt reports feeling better.   Objective: Filed Vitals:   05/03/16 2331 05/04/16 2043 05/05/16 0430 05/05/16 0440  BP: 163/94 139/67 151/92   Pulse: 86 76 84   Temp:  98.2 F (36.8 C) 97.9 F (36.6 C)   TempSrc:  Oral Oral   Resp:  20 20   Height:      Weight:    69.1 kg (152 lb 5.4 oz)  SpO2:  98% 99%     Intake/Output Summary (Last 24 hours) at 05/05/16 1403 Last data filed at 05/05/16 0400  Gross per 24 hour  Intake      0 ml  Output    150 ml  Net   -150 ml   Filed Weights   05/03/16 2130 05/05/16 0440  Weight: 68.856 kg (151 lb 12.8 oz) 69.1 kg (152 lb 5.4 oz)    Examination:  General exam: Appears calm and comfortable  Respiratory system: diminished breath sounds at bases R > L Cardiovascular system:  IrrRR. No JVD, murmurs, rubs, gallops or clicks. No pedal edema. Gastrointestinal system: Abdomen is nondistended, soft and nontender. Central nervous system: Alert and oriented. No focal neurological deficits. Extremities: Symmetric 5 x 5 power. Skin: No rashes, lesions or ulcers Psychiatry: Judgement and insight appear normal. Mood & affect appropriate.    Data Reviewed: I have personally reviewed following labs and imaging studies  CBC:  Recent Labs Lab 05/03/16 1554 05/04/16 0303 05/05/16 0444  WBC 8.3 7.2 6.8  NEUTROABS 7.0  --   --   HGB 12.6 11.7* 12.5  HCT 39.0  36.5 38.5  MCV 93.8 93.1 93.4  PLT 219 215 [id number]   Basic Metabolic Panel:  Recent Labs Lab 05/03/16 1554 05/04/16 0303 05/05/16 0444  NA 134* 136 134*  K 4.2 3.5 3.7  CL 99* 100* 97*  CO2 26 28 28   GLUCOSE 117* 119* 100*  BUN 12 9 9   CREATININE 0.84 0.72 0.87  CALCIUM 9.5 8.7* 9.1  MG  --   --  2.0   Liver Function Tests:  Recent Labs Lab 05/03/16 1554  AST 23  ALT 23  ALKPHOS 51  BILITOT 1.0  PROT 5.9*  ALBUMIN 3.6   Coagulation Profile:  Recent Labs Lab 05/03/16 2209  INR 1.23   Cardiac Enzymes:  Recent Labs Lab 05/03/16 2209  05/04/16 0303 05/04/16 0818  TROPONINI <0.03 <0.03 <0.03   Lipid Profile:  Recent Labs  05/04/16 0303  CHOL 160  HDL 66  LDLCALC 81  TRIG 64  CHOLHDL 2.4   Thyroid Function Tests:  Recent Labs  05/04/16 1237  TSH 1.522  FREET4 1.46*  T3FREE 2.6   Recent Results (from the past 240 hour(s))  Blood culture (routine x 2)     Status: None (Preliminary result)   Collection Time: 05/03/16 10:05 PM  Result Value Ref Range Status   Specimen Description BLOOD RIGHT ANTECUBITAL  Final   Special Requests BOTTLES DRAWN AEROBIC AND ANAEROBIC 5CC  Final   Culture NO GROWTH < 24 HOURS  Final   Report Status PENDING  Incomplete  Blood culture (routine x 2)     Status: None (Preliminary result)   Collection Time: 05/03/16 10:26 PM  Result Value Ref Range Status   Specimen Description BLOOD RIGHT HAND  Final   Special Requests BOTTLES DRAWN AEROBIC ONLY 5CC  Final   Culture NO GROWTH < 24 HOURS  Final   Report Status PENDING  Incomplete      Radiology Studies: Dg Chest 2 View  05/03/2016  CLINICAL DATA:  Shortness of breath onset today. EXAM: CHEST  2 VIEW COMPARISON:  11/03/2014 FINDINGS: Lungs are adequately inflated with opacification over the right middle lobe and lung bases. Findings likely due to infection. Small amount bilateral pleural fluid right greater than left. Cardiomediastinal silhouette is within normal. There is calcified plaque over the aortic arch. There are degenerative changes of the spine [name] with biphasic curvature of the thoracolumbar spine unchanged. Bilateral shoulder prostheses unchanged. In fusion hardware over the cervical is intact. IMPRESSION: Airspace process over the right middle lobe and lung bases likely pneumonia. Small amount of bilateral pleural fluid right greater than left. Electronically Signed   By: Marin Olp M.D.   On: 05/03/2016 16:36   Ct Angio Chest Pe W/cm &/or Wo Cm  05/03/2016  CLINICAL DATA:  Shortness of breath, evaluate for PE  EXAM: CT ANGIOGRAPHY CHEST WITH CONTRAST TECHNIQUE: Multidetector CT imaging of the chest was performed using the standard protocol during bolus administration of intravenous contrast. Multiplanar CT image reconstructions and MIPs were obtained to evaluate the vascular anatomy. CONTRAST:  80 mL Isovue 370 IV COMPARISON:  Chest radiographs dated 05/03/2016 FINDINGS: No evidence of pulmonary embolism. Mediastinum/Nodes: The heart is top-normal in size. No pericardial effusion. Coronary atherosclerosis in the LAD. Atherosclerotic calcifications of the aortic arch. No suspicious mediastinal lymphadenopathy. Visualized thyroid is unremarkable. Lungs/Pleura: Patchy/nodular right lower lobe opacity (series 403/images 80 and 86). Dominant central 1.4 x 2.2 cm nodule (series 403/image 79) with adjacent tree-in-bud nodularity (series 403/image 76). Overall, it is unclear to  what extent this appearance reflect infection versus atelectasis, with tumor not excluded. Additional 7 x 11 mm nodule in the central left lower lobe (series 403/image 76), as well as additional nodularity in the posterior right upper lobe (series 403/image 30 and 36) measuring up to 6 mm. Possible additional soft tissue in the left perihilar region (series 403/image 56). Mild interlobular septal thickening in the bilateral upper lobes, with associated mild ground-glass opacity, possibly reflecting mild interstitial edema. Small bilateral pleural effusions, right greater than left. No pneumothorax. Upper abdomen: Visualized upper abdomen is unremarkable. Musculoskeletal: Degenerative changes of the thoracolumbar spine. Bilateral shoulder arthroplasties. Review of the MIP images confirms the above findings. IMPRESSION: Motion degraded images. No evidence of pulmonary embolism. Suspected mild interstitial edema. Additional small bilateral pleural effusions, right greater than left. Patchy/nodular right lower lobe opacity, including a possible dominant 1.4 x  2.2 cm right lower lobe nodule. Overall, it is unclear to what extent this appearance reflects infection, atelectasis, or possibly tumor, as these findings are not well evaluated on the current study in the acute setting. Consider follow-up CT chest in 4-6 weeks after appropriate antimicrobial therapy. Additional scattered bilateral pulmonary nodules, including a dominant 7 x 11 mm nodule in the central left lower lobe. These can be evaluated at the time of follow-up. Electronically Signed   By: Julian Hy M.D.   On: 05/03/2016 18:26      Scheduled Meds: . antiseptic oral rinse  7 mL Mouth Rinse BID  . aspirin EC  81 mg Oral Daily  . azithromycin  250 mg Oral Daily  . calcium carbonate  1,250 mg Oral BID WC  . cefTRIAXone (ROCEPHIN)  IV  1 g Intravenous Q24H  . cholecalciferol  2,500 Units Oral Daily  . diltiazem  120 mg Oral Daily  . docusate sodium  200 mg Oral QPM  . enalapril  20 mg Oral BID  . hydroxychloroquine  200 mg Oral BID  . loratadine  10 mg Oral Daily  . magnesium oxide  200 mg Oral Daily  . metoprolol succinate  50 mg Oral BID  . predniSONE  4 mg Oral Q breakfast  . protein supplement  4 scoop Oral Daily  . psyllium  1 packet Oral Daily  . rivaroxaban  20 mg Oral Q supper  . sodium chloride flush  3 mL Intravenous Q12H   Continuous Infusions:    LOS: 2 days    Time spent: 20 minutes    Faye Ramsay, MD [facility] Pager [phone]  If 7PM-7AM, please contact night-coverage [email or web address] Password [email or web address] 05/05/2016, 2:03 PM

## 2016-05-06 ENCOUNTER — Encounter (HOSPITAL_COMMUNITY): Payer: Self-pay | Admitting: Student

## 2016-05-06 DIAGNOSIS — J9601 Acute respiratory failure with hypoxia: Secondary | ICD-10-CM | POA: Diagnosis not present

## 2016-05-06 DIAGNOSIS — I4891 Unspecified atrial fibrillation: Secondary | ICD-10-CM | POA: Diagnosis not present

## 2016-05-06 DIAGNOSIS — I10 Essential (primary) hypertension: Secondary | ICD-10-CM | POA: Diagnosis not present

## 2016-05-06 DIAGNOSIS — R938 Abnormal findings on diagnostic imaging of other specified body structures: Secondary | ICD-10-CM | POA: Diagnosis not present

## 2016-05-06 LAB — CBC
HEMATOCRIT: 35.1 % — AB (ref 36.0–46.0)
Hemoglobin: 11.3 g/dL — ABNORMAL LOW (ref 12.0–15.0)
MCH: 30.5 pg (ref 26.0–34.0)
MCHC: 32.2 g/dL (ref 30.0–36.0)
MCV: 94.9 fL (ref 78.0–100.0)
PLATELETS: 205 10*3/uL (ref 150–400)
RBC: 3.7 MIL/uL — AB (ref 3.87–5.11)
RDW: 13 % (ref 11.5–15.5)
WBC: 6.7 10*3/uL (ref 4.0–10.5)

## 2016-05-06 LAB — BASIC METABOLIC PANEL
ANION GAP: 8 (ref 5–15)
BUN: 9 mg/dL (ref 6–20)
CALCIUM: 8.8 mg/dL — AB (ref 8.9–10.3)
CO2: 26 mmol/L (ref 22–32)
Chloride: 98 mmol/L — ABNORMAL LOW (ref 101–111)
Creatinine, Ser: 0.68 mg/dL (ref 0.44–1.00)
GLUCOSE: 93 mg/dL (ref 65–99)
POTASSIUM: 3.8 mmol/L (ref 3.5–5.1)
Sodium: 132 mmol/L — ABNORMAL LOW (ref 135–145)

## 2016-05-06 NOTE — Progress Notes (Signed)
Hospital Problem List     Principal Problem:   Atrial fibrillation, new onset ([place]) Active Problems:   Essential hypertension   Rheumatoid arthritis ([place])   Abnormal chest CT   Acute respiratory failure with hypoxia [facility])    Patient Profile:   Primary Cardiologist: New - Dr. [place]  82 y/o F w/ PMH of HTN, vertigo, TIA, and RA who presented with SOB and DOE found to have a RML PNA, small b/l pleural effusions, and new onset afib. Per chart review she had a prior dx of afib in 2005 and hx of TIA in 2012. Echo in 2012 w/ evidence of left atrial enlargement.  Subjective   Ambulated with PT and with her family yesterday without difficulty. Reports feeling "weak" this AM. Says she did not sleep well. Denies any chest pain or palpitations.  Inpatient Medications    . antiseptic oral rinse  7 mL Mouth Rinse BID  . aspirin EC  81 mg Oral Daily  . azithromycin  250 mg Oral Daily  . calcium carbonate  1,250 mg Oral BID WC  . cefTRIAXone (ROCEPHIN)  IV  1 g Intravenous Q24H  . cholecalciferol  2,500 Units Oral Daily  . diltiazem  120 mg Oral Daily  . docusate sodium  200 mg Oral QPM  . enalapril  20 mg Oral BID  . hydroxychloroquine  200 mg Oral BID  . loratadine  10 mg Oral Daily  . magnesium oxide  200 mg Oral Daily  . metoprolol succinate  50 mg Oral BID  . predniSONE  4 mg Oral Q breakfast  . protein supplement  4 scoop Oral Daily  . psyllium  1 packet Oral Daily  . rivaroxaban  15 mg Oral Q supper  . sodium chloride flush  3 mL Intravenous Q12H    Vital Signs    Filed Vitals:   05/05/16 0440 05/05/16 1456 05/05/16 2112 05/06/16 0430  BP:  122/77 139/63 151/81  Pulse:  73 87 88  Temp:  97.5 F (36.4 C) 98.6 F (37 C) 98.7 F (37.1 C)  TempSrc:  Oral Oral Oral  Resp:  18 18 18   Height:      Weight: 152 lb 5.4 oz (69.1 kg)   153 lb 14.1 oz (69.8 kg)  SpO2:  96% 94% 97%    Intake/Output Summary (Last 24 hours) at 05/06/16 0655 Last data filed at 05/06/16  0434  Gross per 24 hour  Intake      0 ml  Output    600 ml  Net   -600 ml   Filed Weights   05/03/16 2130 05/05/16 0440 05/06/16 0430  Weight: 151 lb 12.8 oz (68.856 kg) 152 lb 5.4 oz (69.1 kg) 153 lb 14.1 oz (69.8 kg)    Physical Exam    General: Well developed, well nourished, female appearing in no acute distress. Head: Normocephalic, atraumatic.  Neck: Supple without bruits, JVD not elevated. Lungs:  Resp regular and unlabored, CTA without wheezing or rales. Heart: Irregularly irregularly, S1, S2, no S3, S4, or murmur; no rub. Abdomen: Soft, non-tender, non-distended with normoactive bowel sounds. No hepatomegaly. No rebound/guarding. No obvious abdominal masses. Extremities: No clubbing, cyanosis, or edema. Distal pedal pulses are 2+ bilaterally. Neuro: Alert and oriented X 3. Moves all extremities spontaneously. Psych: Normal affect.  Labs    CBC  Recent Labs  05/03/16 1554  05/05/16 0444 05/06/16 0324  WBC 8.3  < > 6.8 6.7  NEUTROABS 7.0  --   --   --  HGB 12.6  < > 12.5 11.3*  HCT 39.0  < > 38.5 35.1*  MCV 93.8  < > 93.4 94.9  PLT 219  < > 212 205  < > = values in this interval not displayed. Basic Metabolic Panel  Recent Labs  05/05/16 0444 05/06/16 0324  NA 134* 132*  K 3.7 3.8  CL 97* 98*  CO2 28 26  GLUCOSE 100* 93  BUN 9 9  CREATININE 0.87 0.68  CALCIUM 9.1 8.8*  MG 2.0  --    Liver Function Tests  Recent Labs  05/03/16 1554  AST 23  ALT 23  ALKPHOS 51  BILITOT 1.0  PROT 5.9*  ALBUMIN 3.6   No results for input(s): LIPASE, AMYLASE in the last 72 hours. Cardiac Enzymes  Recent Labs  05/03/16 2209 05/04/16 0303 05/04/16 0818  TROPONINI <0.03 <0.03 <0.03   Fasting Lipid Panel  Recent Labs  05/04/16 0303  CHOL 160  HDL 66  LDLCALC 81  TRIG 64  CHOLHDL 2.4   Thyroid Function Tests  Recent Labs  05/04/16 1237  TSH 1.522  T3FREE 2.6    Telemetry    Atrial fibrillation, HR in 70's - 90's. Peaking in 110's with  activity.  ECG    No new tracings.   Cardiac Studies and Radiology    Dg Chest 2 View: 05/03/2016  CLINICAL DATA:  Shortness of breath onset today. EXAM: CHEST  2 VIEW COMPARISON:  11/03/2014 FINDINGS: Lungs are adequately inflated with opacification over the right middle lobe and lung bases. Findings likely due to infection. Small amount bilateral pleural fluid right greater than left. Cardiomediastinal silhouette is within normal. There is calcified plaque over the aortic arch. There are degenerative changes of the spine Lynne with biphasic curvature of the thoracolumbar spine unchanged. Bilateral shoulder prostheses unchanged. In fusion hardware over the cervical is intact. IMPRESSION: Airspace process over the right middle lobe and lung bases likely pneumonia. Small amount of bilateral pleural fluid right greater than left. Electronically Signed   By: Marin Olp M.D.   On: 05/03/2016 16:36   Ct Angio Chest Pe W/cm &/or Wo Cm: 05/03/2016  CLINICAL DATA:  Shortness of breath, evaluate for PE EXAM: CT ANGIOGRAPHY CHEST WITH CONTRAST TECHNIQUE: Multidetector CT imaging of the chest was performed using the standard protocol during bolus administration of intravenous contrast. Multiplanar CT image reconstructions and MIPs were obtained to evaluate the vascular anatomy. CONTRAST:  80 mL Isovue 370 IV COMPARISON:  Chest radiographs dated 05/03/2016 FINDINGS: No evidence of pulmonary embolism. Mediastinum/Nodes: The heart is top-normal in size. No pericardial effusion. Coronary atherosclerosis in the LAD. Atherosclerotic calcifications of the aortic arch. No suspicious mediastinal lymphadenopathy. Visualized thyroid is unremarkable. Lungs/Pleura: Patchy/nodular right lower lobe opacity (series 403/images 80 and 86). Dominant central 1.4 x 2.2 cm nodule (series 403/image 79) with adjacent tree-in-bud nodularity (series 403/image 76). Overall, it is unclear to what extent this appearance reflect infection versus  atelectasis, with tumor not excluded. Additional 7 x 11 mm nodule in the central left lower lobe (series 403/image 76), as well as additional nodularity in the posterior right upper lobe (series 403/image 30 and 36) measuring up to 6 mm. Possible additional soft tissue in the left perihilar region (series 403/image 56). Mild interlobular septal thickening in the bilateral upper lobes, with associated mild ground-glass opacity, possibly reflecting mild interstitial edema. Small bilateral pleural effusions, right greater than left. No pneumothorax. Upper abdomen: Visualized upper abdomen is unremarkable. Musculoskeletal: Degenerative changes of the  thoracolumbar spine. Bilateral shoulder arthroplasties. Review of the MIP images confirms the above findings. IMPRESSION: Motion degraded images. No evidence of pulmonary embolism. Suspected mild interstitial edema. Additional small bilateral pleural effusions, right greater than left. Patchy/nodular right lower lobe opacity, including a possible dominant 1.4 x 2.2 cm right lower lobe nodule. Overall, it is unclear to what extent this appearance reflects infection, atelectasis, or possibly tumor, as these findings are not well evaluated on the current study in the acute setting. Consider follow-up CT chest in 4-6 weeks after appropriate antimicrobial therapy. Additional scattered bilateral pulmonary nodules, including a dominant 7 x 11 mm nodule in the central left lower lobe. These can be evaluated at the time of follow-up. Electronically Signed   By: Julian Hy M.D.   On: 05/03/2016 18:26    Echocardiogram: 05/04/2016 Study Conclusions  - Left ventricle: The cavity size was normal. Wall thickness was  increased in a pattern of mild LVH. Systolic function was normal.  The estimated ejection fraction was in the range of 60% to 65%.  Wall motion was normal; there were no regional wall motion  abnormalities. - Mitral valve: Mildly calcified annulus.  There was moderate  regurgitation. - Right atrium: The atrium was mildly dilated. - Pulmonary arteries: Systolic pressure was mildly increased. PA  peak pressure: 32 mm Hg (S).  Assessment & Plan    1. Paroxysmal atrial fibrillation - presented with SOB and DOE. CXR showed RML PNA. CT showed 2 pulmonary nodules and RLL mass vs. Infection. Will need follow-up imaging. She was also found to be in Afib with RVR.  - this patients CHA2DS2-VASc Score and unadjusted Ischemic Stroke Rate (% per year) is equal to 11.2 % stroke rate/year from a score of 7. (CHF, HTN, Female, Age (2), TIA (2). She has been started on Xarelto. 30-day card has been provided by Case Management. - Remains in Afib today, rate in 70's - 90's. BP stable. Switched to Cardizem CD 120mg  daily. Could further titrate as an outpatient if HR becomes difficult to control.  2. Rheumatoid arthritis - on chronic steroids and plaquenil. Plaquenil can cause cardiotoxicity, conduction abnormalities, and QTc prolongation. - Avoid QTc prolongating medications, caution w/ azithromycin.   3. HTN - Continue ACE-I and BB.   4. Chronic Diastolic CHF - Patient has history of grade 2 diastolic dysfunction, Echo done yesterday was not technically sufficient to allow evaluation of LV diastolic dysfunction. - continue ACE-I and BB.    Will arrange for 2-week Cardiology Hospital Follow-Up.  [name] , PA-C 6:55 AM 05/06/2016 Pager: [phone] Patient seen and examined and history reviewed. Agree with above findings and plan. Patient feels weak and more SOB today. sats are good. No edema. Lungs with crackles on left. Diminished BS on the Right. afib rate is well controlled.  We will continue rate control with Cardizem CD and Toprol. On Xarelto.   [name], [place] 05/06/2016 9:32 AM

## 2016-05-06 NOTE — Progress Notes (Signed)
Patient ID: [name], female   DOB: 11/21/1928, 82 y.o.   MRN: [id number]    PROGRESS NOTE    [name]  [id number] DOB: 08-06-1928 DOA: 05/03/2016  PCP: Lilian Coma, MD   Brief Narrative:  82 year old Caucasian female with a past medical history of hypertension and rheumatoid arthritis, presented with complaints of shortness of breath. Evaluation revealed new onset atrial fibrillation. CT angiogram of the chest did not reveal PE. However, multiple pulmonary nodules, ? PNA noted. Cardiology and PCCM consulted for assistance. Patient was hospitalized for further management.  Assessment & Plan:   Paroxysmal atrial fibrillation, CHADS2 vasc score 6-7 - Rate controlled with diltiazem gtt initially, changed to PO Cardizem 120 mg QD  - also on Metoprolol 50 mg BID PO - continue Xarelto   Chronic diastolic CHF, more dyspnea this AM - more crackles on exam this AM - weight 69 kg and stable in the past 24 hours  - patient with known grade 2 diastolic dysfunction - ECHO not technically sufficient to allow evaluation of LV diastolic dysfunction - repeat CXR for follow up   ? RML nodule, PNA vs atelectasis vs malignancy - imaging studies reviewed with pt and her daughter - pt very clear she is not interested in invasive interventions including biopsy and potential need for chemotx - family acknowledged that this was also discussed in detail with PCCM and conservative management with monitoring is preferred  - discussed with family that if pt wants, CT chest can be repeated in an outpatient setting in 4-6 weeks  - continue Zithromax and Rocephin day #4/7  Rheumatoid arthritis - on chronic steroids and plaquenil  Hyponatremia - mild, encouraged PO intake - BMP in AM  HTN, essential  - Continue ACE and metoprolol - Cardizem changed to long acting 120 mg PO QD     DVT prophylaxis: Xarelto  Code Status: Full  Family Communication: Patient and daughter at bedside    Disposition Plan: Home in AM if dyspnea resolved   Consultants:   Cardiology  PCCM  Procedures:   None  Antimicrobials:   Zithromax and Rocephin 5/22 -->   Subjective: Pt reports feeling tired and more short of breath. Did not sleep well.   Objective: Filed Vitals:   05/05/16 0440 05/05/16 1456 05/05/16 2112 05/06/16 0430  BP:  122/77 139/63 151/81  Pulse:  73 87 88  Temp:  97.5 F (36.4 C) 98.6 F (37 C) 98.7 F (37.1 C)  TempSrc:  Oral Oral Oral  Resp:  18 18 18   Height:      Weight: 69.1 kg (152 lb 5.4 oz)   69.8 kg (153 lb 14.1 oz)  SpO2:  96% 94% 97%    Intake/Output Summary (Last 24 hours) at 05/06/16 1013 Last data filed at 05/06/16 0912  Gross per 24 hour  Intake    240 ml  Output    600 ml  Net   -360 ml   Filed Weights   05/03/16 2130 05/05/16 0440 05/06/16 0430  Weight: 68.856 kg (151 lb 12.8 oz) 69.1 kg (152 lb 5.4 oz) 69.8 kg (153 lb 14.1 oz)    Examination:  General exam: Appears calm and comfortable  Respiratory system: diminished breath sounds at bases R > L, mild bibasilar crackles  Cardiovascular system:  IrrRR. No JVD, murmurs, rubs, gallops or clicks. No pedal edema. Gastrointestinal system: Abdomen is nondistended, soft and nontender. Central nervous system: Alert and oriented. No focal neurological deficits. Extremities: Symmetric 5 x  5 power. Skin: No rashes, lesions or ulcers Psychiatry: Judgement and insight appear normal. Mood & affect appropriate.    Data Reviewed: I have personally reviewed following labs and imaging studies  CBC:  Recent Labs Lab 05/03/16 1554 05/04/16 0303 05/05/16 0444 05/06/16 0324  WBC 8.3 7.2 6.8 6.7  NEUTROABS 7.0  --   --   --   HGB 12.6 11.7* 12.5 11.3*  HCT 39.0 36.5 38.5 35.1*  MCV 93.8 93.1 93.4 94.9  PLT 219 215 212 [id number]   Basic Metabolic Panel:  Recent Labs Lab 05/03/16 1554 05/04/16 0303 05/05/16 0444 05/06/16 0324  NA 134* 136 134* 132*  K 4.2 3.5 3.7 3.8  CL 99* 100*  97* 98*  CO2 26 28 28 26   GLUCOSE 117* 119* 100* 93  BUN 12 9 9 9   CREATININE 0.84 0.72 0.87 0.68  CALCIUM 9.5 8.7* 9.1 8.8*  MG  --   --  2.0  --    Liver Function Tests:  Recent Labs Lab 05/03/16 1554  AST 23  ALT 23  ALKPHOS 51  BILITOT 1.0  PROT 5.9*  ALBUMIN 3.6   Coagulation Profile:  Recent Labs Lab 05/03/16 2209  INR 1.23   Cardiac Enzymes:  Recent Labs Lab 05/03/16 2209 05/04/16 0303 05/04/16 0818  TROPONINI <0.03 <0.03 <0.03   Lipid Profile:  Recent Labs  05/04/16 0303  CHOL 160  HDL 66  LDLCALC 81  TRIG 64  CHOLHDL 2.4   Thyroid Function Tests:  Recent Labs  05/04/16 1237  TSH 1.522  FREET4 1.46*  T3FREE 2.6   Recent Results (from the past 240 hour(s))  Blood culture (routine x 2)     Status: None (Preliminary result)   Collection Time: 05/03/16 10:05 PM  Result Value Ref Range Status   Specimen Description BLOOD RIGHT ANTECUBITAL  Final   Special Requests BOTTLES DRAWN AEROBIC AND ANAEROBIC 5CC  Final   Culture NO GROWTH 2 DAYS  Final   Report Status PENDING  Incomplete  Blood culture (routine x 2)     Status: None (Preliminary result)   Collection Time: 05/03/16 10:26 PM  Result Value Ref Range Status   Specimen Description BLOOD RIGHT HAND  Final   Special Requests BOTTLES DRAWN AEROBIC ONLY 5CC  Final   Culture NO GROWTH 2 DAYS  Final   Report Status PENDING  Incomplete      Radiology Studies: No results found.    Scheduled Meds: . antiseptic oral rinse  7 mL Mouth Rinse BID  . aspirin EC  81 mg Oral Daily  . azithromycin  250 mg Oral Daily  . calcium carbonate  1,250 mg Oral BID WC  . cefTRIAXone (ROCEPHIN)  IV  1 g Intravenous Q24H  . cholecalciferol  2,500 Units Oral Daily  . diltiazem  120 mg Oral Daily  . docusate sodium  200 mg Oral QPM  . enalapril  20 mg Oral BID  . hydroxychloroquine  200 mg Oral BID  . loratadine  10 mg Oral Daily  . magnesium oxide  200 mg Oral Daily  . metoprolol succinate  50 mg  Oral BID  . predniSONE  4 mg Oral Q breakfast  . protein supplement  4 scoop Oral Daily  . psyllium  1 packet Oral Daily  . rivaroxaban  15 mg Oral Q supper  . sodium chloride flush  3 mL Intravenous Q12H   Continuous Infusions:    LOS: 3 days    Time spent: 20  minutes    Faye Ramsay, MD [facility] Pager [phone]  If 7PM-7AM, please contact night-coverage [email or web address] Password [email or web address] 05/06/2016, 10:13 AM

## 2016-05-07 ENCOUNTER — Inpatient Hospital Stay (HOSPITAL_COMMUNITY): Payer: PPO

## 2016-05-07 DIAGNOSIS — I10 Essential (primary) hypertension: Secondary | ICD-10-CM | POA: Diagnosis not present

## 2016-05-07 DIAGNOSIS — I4891 Unspecified atrial fibrillation: Secondary | ICD-10-CM | POA: Diagnosis not present

## 2016-05-07 DIAGNOSIS — J9601 Acute respiratory failure with hypoxia: Secondary | ICD-10-CM | POA: Diagnosis not present

## 2016-05-07 DIAGNOSIS — J9811 Atelectasis: Secondary | ICD-10-CM | POA: Diagnosis not present

## 2016-05-07 DIAGNOSIS — R938 Abnormal findings on diagnostic imaging of other specified body structures: Secondary | ICD-10-CM | POA: Diagnosis not present

## 2016-05-07 DIAGNOSIS — J189 Pneumonia, unspecified organism: Secondary | ICD-10-CM | POA: Diagnosis not present

## 2016-05-07 DIAGNOSIS — J90 Pleural effusion, not elsewhere classified: Secondary | ICD-10-CM | POA: Diagnosis not present

## 2016-05-07 LAB — CBC
HEMATOCRIT: 34.5 % — AB (ref 36.0–46.0)
HEMOGLOBIN: 11.4 g/dL — AB (ref 12.0–15.0)
MCH: 30.9 pg (ref 26.0–34.0)
MCHC: 33 g/dL (ref 30.0–36.0)
MCV: 93.5 fL (ref 78.0–100.0)
Platelets: 207 10*3/uL (ref 150–400)
RBC: 3.69 MIL/uL — ABNORMAL LOW (ref 3.87–5.11)
RDW: 12.9 % (ref 11.5–15.5)
WBC: 5.6 10*3/uL (ref 4.0–10.5)

## 2016-05-07 LAB — BASIC METABOLIC PANEL
ANION GAP: 8 (ref 5–15)
BUN: 6 mg/dL (ref 6–20)
CALCIUM: 8.8 mg/dL — AB (ref 8.9–10.3)
CHLORIDE: 100 mmol/L — AB (ref 101–111)
CO2: 26 mmol/L (ref 22–32)
Creatinine, Ser: 0.71 mg/dL (ref 0.44–1.00)
GFR calc non Af Amer: >60 mL/min (ref >60)
Glucose, Bld: 85 mg/dL (ref 65–99)
Potassium: 3.9 mmol/L (ref 3.5–5.1)
Sodium: 134 mmol/L — ABNORMAL LOW (ref 135–145)

## 2016-05-07 MED ORDER — DILTIAZEM HCL ER COATED BEADS 120 MG PO CP24: 120.0000 mg | ORAL_CAPSULE | Freq: Every day | ORAL | Status: DC

## 2016-05-07 MED ORDER — OFF THE BEAT BOOK
Freq: Once | Status: DC
  Filled 2016-05-07: qty 1

## 2016-05-07 MED ORDER — LEVALBUTEROL HCL 1.25 MG/0.5ML IN NEBU: 1.2500 mg | INHALATION_SOLUTION | Freq: Three times a day (TID) | RESPIRATORY_TRACT | Status: DC | PRN

## 2016-05-07 MED ORDER — RIVAROXABAN 15 MG PO TABS: 15.0000 mg | ORAL_TABLET | Freq: Every day | ORAL | Status: DC

## 2016-05-07 MED ORDER — AZITHROMYCIN 250 MG PO TABS: 250.0000 mg | ORAL_TABLET | Freq: Every day | ORAL | Status: DC

## 2016-05-07 NOTE — Care Management Note (Signed)
Case Management Note Marvetta Gibbons RN, BSN Unit 2W-Case Manager [phone]  Patient Details  Name: [name] MRN: [id number] Date of Birth: 1928-12-05  Subjective/Objective:     Pt admitted with afib, ?PNA- IV abx               Action/Plan: PTA pt lived at [place] apartment- anticipated return to [place] apartment- pt has been started on Xarelto- referral received for insurance check on benefit coverage- Pt copay will be $ 45-prior auth not required - spoke with pt at bedside- coverage info given- pt states she normally uses mail order- 30 day free card given to pt to use at local pharmacy on discharge- pt will need script for both 30 day free card- no refills- and one to send into her mail order with refills. No further CM needs noted at this time- will follow.   Expected Discharge Date:    05/07/16              Expected Discharge Plan:  Home/Self Care  In-House Referral:     Discharge planning Services  CM Consult, Medication Assistance  Post Acute Care Choice:  Durable Medical Equipment Choice offered to:     DME Arranged:  Nebulizer machine DME Agency:  [place]:    [facility] Agency:     Status of Service:  Completed, signed off  Medicare Important Message Given:  Yes Date Medicare IM Given:    Medicare IM give by:    Date Additional Medicare IM Given:    Additional Medicare Important Message give by:     If discussed at [place] of Stay Meetings, dates discussed:    Additional Comments:  05/07/16- 1150- Marvetta Gibbons RN, BSN- pt for d/c home today- order placed for nebulizer machine- Jermaine with [facility] notified of DME need- neb. Machine to be delivered to room prior to discharge.   Dawayne Patricia, RN 05/07/2016, 11:51 AM

## 2016-05-07 NOTE — Progress Notes (Signed)
Order received to discharge.  Telemetry removed and CCMD notified.  IV removed with catheter intact. Discharge education given to Pt with daughter at bedside.  Book "off the beat" given to Pt.  All questions answered.  Pt denies chest pain or sob at this time.  Pt stable to discharge.

## 2016-05-07 NOTE — Progress Notes (Signed)
Hospital Problem List     Principal Problem:   Atrial fibrillation, new onset ([place]) Active Problems:   Essential hypertension   Rheumatoid arthritis ([place])   Abnormal chest CT   Acute respiratory failure with hypoxia [facility])    Patient Profile:   Primary Cardiologist: New - Dr. Martinique  82 y/o F w/ PMH of HTN, vertigo, TIA, and RA who presented with SOB and DOE found to have a RML PNA, small b/l pleural effusions, and new onset afib. Per chart review she had a prior dx of afib in 2005 and hx of TIA in 2012. Echo in 2012 w/ evidence of left atrial enlargement.  Subjective   Reports feeling significantly better this morning. Denies any chest pain or palpitations. Anxious to go home.  Inpatient Medications    . antiseptic oral rinse  7 mL Mouth Rinse BID  . azithromycin  250 mg Oral Daily  . calcium carbonate  1,250 mg Oral BID WC  . cefTRIAXone (ROCEPHIN)  IV  1 g Intravenous Q24H  . cholecalciferol  2,500 Units Oral Daily  . diltiazem  120 mg Oral Daily  . docusate sodium  200 mg Oral QPM  . enalapril  20 mg Oral BID  . hydroxychloroquine  200 mg Oral BID  . loratadine  10 mg Oral Daily  . magnesium oxide  200 mg Oral Daily  . metoprolol succinate  50 mg Oral BID  . predniSONE  4 mg Oral Q breakfast  . protein supplement  4 scoop Oral Daily  . psyllium  1 packet Oral Daily  . rivaroxaban  15 mg Oral Q supper  . sodium chloride flush  3 mL Intravenous Q12H    Vital Signs    Filed Vitals:   05/06/16 0959 05/06/16 1312 05/06/16 2130 05/07/16 0427  BP: 152/83 134/65 133/91 132/66  Pulse:  87 88 71  Temp:  98.2 F (36.8 C) 98.3 F (36.8 C) 98.2 F (36.8 C)  TempSrc:  Oral Oral Oral  Resp:  18 18 16   Height:      Weight:    153 lb 11.2 oz (69.718 kg)  SpO2:  97% 96% 94%    Intake/Output Summary (Last 24 hours) at 05/07/16 0815 Last data filed at 05/06/16 2130  Gross per 24 hour  Intake    360 ml  Output    300 ml  Net     60 ml   Filed Weights   05/05/16  0440 05/06/16 0430 05/07/16 0427  Weight: 152 lb 5.4 oz (69.1 kg) 153 lb 14.1 oz (69.8 kg) 153 lb 11.2 oz (69.718 kg)    Physical Exam    General: Well developed, well nourished, female appearing in no acute distress. Head: Normocephalic, atraumatic.  Neck: Supple without bruits, JVD not elevated. Lungs:  Resp regular and unlabored, mild rales at left base. Heart: Irregularly irregular, S1, S2, no S3, S4, or murmur; no rub. Abdomen: Soft, non-tender, non-distended with normoactive bowel sounds. No hepatomegaly. No rebound/guarding. No obvious abdominal masses. Extremities: No clubbing, cyanosis, or edema. Distal pedal pulses are 2+ bilaterally. Neuro: Alert and oriented X 3. Moves all extremities spontaneously. Psych: Normal affect.  Labs    CBC  Recent Labs  05/06/16 0324 05/07/16 0301  WBC 6.7 5.6  HGB 11.3* 11.4*  HCT 35.1* 34.5*  MCV 94.9 93.5  PLT 205 [id number]   Basic Metabolic Panel  Recent Labs  05/05/16 0444 05/06/16 0324 05/07/16 0301  NA 134* 132* 134*  K  3.7 3.8 3.9  CL 97* 98* 100*  CO2 28 26 26   GLUCOSE 100* 93 85  BUN 9 9 6   CREATININE 0.87 0.68 0.71  CALCIUM 9.1 8.8* 8.8*  MG 2.0  --   --    Liver Function Tests No results for input(s): AST, ALT, ALKPHOS, BILITOT, PROT, ALBUMIN in the last 72 hours. No results for input(s): LIPASE, AMYLASE in the last 72 hours. Cardiac Enzymes  Recent Labs  05/04/16 0818  TROPONINI <0.03   Thyroid Function Tests  Recent Labs  05/04/16 1237  TSH 1.522  T3FREE 2.6    Telemetry    Atrial fibrillation, HR in 70's - 90's. 8 beats NSVT overnight.  ECG    No new tracings.   Cardiac Studies and Radiology    Dg Chest 2 View: 05/03/2016  CLINICAL DATA:  Shortness of breath onset today. EXAM: CHEST  2 VIEW COMPARISON:  11/03/2014 FINDINGS: Lungs are adequately inflated with opacification over the right middle lobe and lung bases. Findings likely due to infection. Small amount bilateral pleural fluid right  greater than left. Cardiomediastinal silhouette is within normal. There is calcified plaque over the aortic arch. There are degenerative changes of the spine Lynne with biphasic curvature of the thoracolumbar spine unchanged. Bilateral shoulder prostheses unchanged. In fusion hardware over the cervical is intact. IMPRESSION: Airspace process over the right middle lobe and lung bases likely pneumonia. Small amount of bilateral pleural fluid right greater than left. Electronically Signed   By: Marin Olp M.D.   On: 05/03/2016 16:36   Ct Angio Chest Pe W/cm &/or Wo Cm: 05/03/2016  CLINICAL DATA:  Shortness of breath, evaluate for PE EXAM: CT ANGIOGRAPHY CHEST WITH CONTRAST TECHNIQUE: Multidetector CT imaging of the chest was performed using the standard protocol during bolus administration of intravenous contrast. Multiplanar CT image reconstructions and MIPs were obtained to evaluate the vascular anatomy. CONTRAST:  80 mL Isovue 370 IV COMPARISON:  Chest radiographs dated 05/03/2016 FINDINGS: No evidence of pulmonary embolism. Mediastinum/Nodes: The heart is top-normal in size. No pericardial effusion. Coronary atherosclerosis in the LAD. Atherosclerotic calcifications of the aortic arch. No suspicious mediastinal lymphadenopathy. Visualized thyroid is unremarkable. Lungs/Pleura: Patchy/nodular right lower lobe opacity (series 403/images 80 and 86). Dominant central 1.4 x 2.2 cm nodule (series 403/image 79) with adjacent tree-in-bud nodularity (series 403/image 76). Overall, it is unclear to what extent this appearance reflect infection versus atelectasis, with tumor not excluded. Additional 7 x 11 mm nodule in the central left lower lobe (series 403/image 76), as well as additional nodularity in the posterior right upper lobe (series 403/image 30 and 36) measuring up to 6 mm. Possible additional soft tissue in the left perihilar region (series 403/image 56). Mild interlobular septal thickening in the bilateral  upper lobes, with associated mild ground-glass opacity, possibly reflecting mild interstitial edema. Small bilateral pleural effusions, right greater than left. No pneumothorax. Upper abdomen: Visualized upper abdomen is unremarkable. Musculoskeletal: Degenerative changes of the thoracolumbar spine. Bilateral shoulder arthroplasties. Review of the MIP images confirms the above findings. IMPRESSION: Motion degraded images. No evidence of pulmonary embolism. Suspected mild interstitial edema. Additional small bilateral pleural effusions, right greater than left. Patchy/nodular right lower lobe opacity, including a possible dominant 1.4 x 2.2 cm right lower lobe nodule. Overall, it is unclear to what extent this appearance reflects infection, atelectasis, or possibly tumor, as these findings are not well evaluated on the current study in the acute setting. Consider follow-up CT chest in 4-6 weeks after appropriate  antimicrobial therapy. Additional scattered bilateral pulmonary nodules, including a dominant 7 x 11 mm nodule in the central left lower lobe. These can be evaluated at the time of follow-up. Electronically Signed   By: Julian Hy M.D.   On: 05/03/2016 18:26    Echocardiogram: 05/04/2016 Study Conclusions  - Left ventricle: The cavity size was normal. Wall thickness was  increased in a pattern of mild LVH. Systolic function was normal.  The estimated ejection fraction was in the range of 60% to 65%.  Wall motion was normal; there were no regional wall motion  abnormalities. - Mitral valve: Mildly calcified annulus. There was moderate  regurgitation. - Right atrium: The atrium was mildly dilated. - Pulmonary arteries: Systolic pressure was mildly increased. PA  peak pressure: 32 mm Hg (S).  Assessment & Plan    1. Paroxysmal atrial fibrillation - presented with SOB and DOE. CXR showed RML PNA. CT showed 2 pulmonary nodules and RLL mass vs. Infection. Will need follow-up  imaging. She was also found to be in Afib with RVR.  - this patients CHA2DS2-VASc Score and unadjusted Ischemic Stroke Rate (% per year) is equal to 11.2 % stroke rate/year from a score of 7. (CHF, HTN, Female, Age (2), TIA (2). She has been started on Xarelto. 30-day card has been provided by Case Management. - HR remains well-controlled. BP stable. Switched to Cardizem CD 120mg  daily. Could further titrate as an outpatient if HR becomes difficult to control.  2. Rheumatoid arthritis - on chronic steroids and Plaquenil. Plaquenil can cause cardiotoxicity, conduction abnormalities, and QTc prolongation. - Avoid QTc prolongating medications, caution w/ azithromycin.   3. HTN - Continue ACE-I and BB.   4. Chronic Diastolic CHF - Patient has history of grade 2 diastolic dysfunction, Echo done yesterday was not technically sufficient to allow evaluation of LV diastolic dysfunction. - continue ACE-I and BB.   Arna Medici , PA-C 8:15 AM 05/07/2016 Pager: [phone] Patient seen and examined and history reviewed. Agree with above findings and plan. Patient is feeling better today. Lungs are clear on exam. HR is controlled. She is stable for DC from a cardiac standpoint.  Peter Martinique, [place] 05/07/2016 9:16 AM

## 2016-05-07 NOTE — Care Management Important Message (Signed)
Important Message  Patient Details  Name: [name] MRN: [id number] Date of Birth: Apr 08, 1928   Medicare Important Message Given:  Yes    Nathen May 05/07/2016, 1:23 PM

## 2016-05-07 NOTE — Discharge Instructions (Signed)

## 2016-05-07 NOTE — Discharge Summary (Signed)
Physician Discharge Summary  [name] [id number] DOB: 1928-03-30 DOA: 05/03/2016  PCP: Lilian Coma, MD  Admit date: 05/03/2016 Discharge date: 05/07/2016  Recommendations for Outpatient Follow-up:  1. Pt will need to follow up with PCP in 1-2 weeks post discharge 2. Please obtain BMP to evaluate electrolytes and kidney function 3. Please also check CBC to evaluate Hg and Hct levels 4. Pt made aware of CT chest findings and our recommendation that if desires pt can have repeat CT chest for follow up  5. Pt stated on Xarelto for new onset a-fb, please see medication list below   Discharge Diagnoses:  Principal Problem:   Atrial fibrillation, new onset ([place]) Active Problems:   Essential hypertension   Rheumatoid arthritis ([place])   Abnormal chest CT   Acute respiratory failure with hypoxia ([place])  Discharge Condition: Stable  Diet recommendation: Heart healthy diet discussed in details   History of present illness:   Brief Narrative:  82 year old Caucasian female with a past medical history of hypertension and rheumatoid arthritis, presented with complaints of shortness of breath. Evaluation revealed new onset atrial fibrillation. CT angiogram of the chest did not reveal PE. However, multiple pulmonary nodules, ? PNA noted. Cardiology and PCCM consulted for assistance. Patient was hospitalized for further management.  Assessment & Plan:  Paroxysmal atrial fibrillation, CHADS2 vasc score 6-7 - Rate controlled with diltiazem gtt initially, changed to PO Cardizem 120 mg QD  - also on Metoprolol - continue Xarelto   Chronic diastolic CHF, more dyspnea this AM - weight 69 kg and stable in the past 48 hours  - patient with known grade 2 diastolic dysfunction - ECHO not technically sufficient to allow evaluation of LV diastolic dysfunction  ? RML nodule, PNA vs atelectasis vs malignancy - imaging studies reviewed with pt and her daughter - pt very clear she is not  interested in invasive interventions including biopsy and potential need for chemotx - family acknowledged that this was also discussed in detail with PCCM and conservative management with monitoring is preferred  - discussed with family that if pt wants, CT chest can be repeated in an outpatient setting in 4-6 weeks  - continue Zithromax upon discharge to complete therapy   Rheumatoid arthritis - on chronic steroids and plaquenil  Hyponatremia - mild  HTN, essential  - Continue ACE and metoprolol, Cardizem    DVT prophylaxis: Xarelto  Code Status: Full  Family Communication: Patient and daughter at bedside  Disposition Plan: Home  Consultants:   Cardiology  PCCM  Procedures:   None  Antimicrobials:   Zithromax upon discharge to complete therapy   Discharge Exam: Filed Vitals:   05/06/16 2130 05/07/16 0427  BP: 133/91 132/66  Pulse: 88 71  Temp: 98.3 F (36.8 C) 98.2 F (36.8 C)  Resp: 18 16   Filed Vitals:   05/06/16 0959 05/06/16 1312 05/06/16 2130 05/07/16 0427  BP: 152/83 134/65 133/91 132/66  Pulse:  87 88 71  Temp:  98.2 F (36.8 C) 98.3 F (36.8 C) 98.2 F (36.8 C)  TempSrc:  Oral Oral Oral  Resp:  18 18 16   Height:      Weight:    69.718 kg (153 lb 11.2 oz)  SpO2:  97% 96% 94%    General: Pt is alert, follows commands appropriately, not in acute distress Cardiovascular: Irregular rate and rhythm, no rubs, no gallops Respiratory: Clear to auscultation bilaterally, diminished breath sounds at bases  Abdominal: Soft, non tender, non distended, bowel sounds +,  no guarding Extremities: no edema, no cyanosis, pulses palpable bilaterally DP and PT  Discharge Instructions  Discharge Instructions    Diet - low sodium heart healthy    Complete by:  As directed      Increase activity slowly    Complete by:  As directed             Medication List    STOP taking these medications        amLODipine 10 MG tablet  Commonly known as:   NORVASC     aspirin 81 MG tablet      TAKE these medications        azithromycin 250 MG tablet  Commonly known as:  ZITHROMAX  Take 1 tablet (250 mg total) by mouth daily.     BENEFIBER PO  Take 4 scoop by mouth daily.     calcium carbonate 600 MG Tabs tablet  Commonly known as:  OS-CAL  Take 600 mg by mouth 2 (two) times daily with a meal.     cholecalciferol 1000 units tablet  Commonly known as:  VITAMIN D  Take 2,500 Units by mouth daily.     CLARITIN 10 MG tablet  Generic drug:  loratadine  Take 10 mg by mouth daily.     diltiazem 120 MG 24 hr capsule  Commonly known as:  CARDIZEM CD  Take 1 capsule (120 mg total) by mouth daily.     docusate sodium 100 MG capsule  Commonly known as:  COLACE  Take 200 mg by mouth every evening.     enalapril 20 MG tablet  Commonly known as:  VASOTEC  Take 20 mg by mouth 2 (two) times daily.     Fish Oil 1000 MG Caps  Take 1 capsule by mouth every evening.     hydroxychloroquine 200 MG tablet  Commonly known as:  PLAQUENIL  Take 200 mg by mouth 2 (two) times daily.     levalbuterol 1.25 MG/0.5ML nebulizer solution  Commonly known as:  XOPENEX  Take 1.25 mg by nebulization every 8 (eight) hours as needed for wheezing or shortness of breath.     Magnesium 250 MG Tabs  Take 250 mg by mouth daily.     metoprolol succinate 50 MG 24 hr tablet  Commonly known as:  TOPROL-XL  Take 50 mg by mouth 2 (two) times daily.     predniSONE 1 MG tablet  Commonly known as:  DELTASONE  Take 4 mg by mouth daily with breakfast.     Rivaroxaban 15 MG Tabs tablet  Commonly known as:  XARELTO  Take 1 tablet (15 mg total) by mouth daily with supper.     vitamin C 500 MG tablet  Commonly known as:  ASCORBIC ACID  Take 500 mg by mouth daily.            Follow-up Information    Follow up with [facility] On 05/13/2016.   Specialty:  Cardiology   Why:  Cardiology Hospital Follow-Up on 05/13/2016 at 11:00AM with Rosaria Ferries, PA-C (Dr. Doug Sou Office).    Contact information:   [address] [place] [phone]      Follow up with Lilian Coma, MD.   Specialty:  Family Medicine   Contact information:   [place] [address] [phone]       Call Faye Ramsay, MD.   Specialty:  Internal Medicine   Why:  As  needed call my cell phone [phone]   Contact information:   [address] [place] [address] [phone]        The results of significant diagnostics from this hospitalization (including imaging, microbiology, ancillary and laboratory) are listed below for reference.     Microbiology: Recent Results (from the past 240 hour(s))  Blood culture (routine x 2)     Status: None (Preliminary result)   Collection Time: 05/03/16 10:05 PM  Result Value Ref Range Status   Specimen Description BLOOD RIGHT ANTECUBITAL  Final   Special Requests BOTTLES DRAWN AEROBIC AND ANAEROBIC 5CC  Final   Culture NO GROWTH 3 DAYS  Final   Report Status PENDING  Incomplete  Blood culture (routine x 2)     Status: None (Preliminary result)   Collection Time: 05/03/16 10:26 PM  Result Value Ref Range Status   Specimen Description BLOOD RIGHT HAND  Final   Special Requests BOTTLES DRAWN AEROBIC ONLY 5CC  Final   Culture NO GROWTH 3 DAYS  Final   Report Status PENDING  Incomplete     Labs: Basic Metabolic Panel:  Recent Labs Lab 05/03/16 1554 05/04/16 0303 05/05/16 0444 05/06/16 0324 05/07/16 0301  NA 134* 136 134* 132* 134*  K 4.2 3.5 3.7 3.8 3.9  CL 99* 100* 97* 98* 100*  CO2 26 28 28 26 26   GLUCOSE 117* 119* 100* 93 85  BUN 12 9 9 9 6   CREATININE 0.84 0.72 0.87 0.68 0.71  CALCIUM 9.5 8.7* 9.1 8.8* 8.8*  MG  --   --  2.0  --   --    Liver Function Tests:  Recent Labs Lab 05/03/16 1554  AST 23  ALT 23  ALKPHOS 51  BILITOT 1.0  PROT 5.9*  ALBUMIN 3.6   CBC:  Recent  Labs Lab 05/03/16 1554 05/04/16 0303 05/05/16 0444 05/06/16 0324 05/07/16 0301  WBC 8.3 7.2 6.8 6.7 5.6  NEUTROABS 7.0  --   --   --   --   HGB 12.6 11.7* 12.5 11.3* 11.4*  HCT 39.0 36.5 38.5 35.1* 34.5*  MCV 93.8 93.1 93.4 94.9 93.5  PLT 219 215 212 205 207   Cardiac Enzymes:  Recent Labs Lab 05/03/16 2209 05/04/16 0303 05/04/16 0818  TROPONINI <0.03 <0.03 <0.03   BNP: BNP (last 3 results)  Recent Labs  05/04/16 1426  BNP 254.0*   SIGNED: Time coordinating discharge: 30 minutes  Faye Ramsay, MD  [facility] 05/07/2016, 9:41 AM Pager [phone]  If 7PM-7AM, please contact night-coverage [email or web address] Password [email or web address]

## 2016-05-08 LAB — CULTURE, BLOOD (ROUTINE X 2)
CULTURE: NO GROWTH
CULTURE: NO GROWTH

## 2016-05-10 ENCOUNTER — Emergency Department (HOSPITAL_COMMUNITY)
Admission: EM | Admit: 2016-05-10 | Discharge: 2016-05-10 | Disposition: A | Discharge status: Home / Self Care | Payer: PPO | Attending: Emergency Medicine | Admitting: Emergency Medicine

## 2016-05-10 ENCOUNTER — Emergency Department (HOSPITAL_COMMUNITY): Payer: PPO

## 2016-05-10 ENCOUNTER — Encounter (HOSPITAL_COMMUNITY): Payer: Self-pay | Admitting: Emergency Medicine

## 2016-05-10 DIAGNOSIS — Z792 Long term (current) use of antibiotics: Secondary | ICD-10-CM | POA: Diagnosis not present

## 2016-05-10 DIAGNOSIS — Z79899 Other long term (current) drug therapy: Secondary | ICD-10-CM | POA: Insufficient documentation

## 2016-05-10 DIAGNOSIS — I10 Essential (primary) hypertension: Secondary | ICD-10-CM | POA: Diagnosis not present

## 2016-05-10 DIAGNOSIS — I4891 Unspecified atrial fibrillation: Secondary | ICD-10-CM | POA: Insufficient documentation

## 2016-05-10 DIAGNOSIS — Z7901 Long term (current) use of anticoagulants: Secondary | ICD-10-CM | POA: Insufficient documentation

## 2016-05-10 DIAGNOSIS — M069 Rheumatoid arthritis, unspecified: Secondary | ICD-10-CM | POA: Insufficient documentation

## 2016-05-10 DIAGNOSIS — R0600 Dyspnea, unspecified: Secondary | ICD-10-CM | POA: Diagnosis not present

## 2016-05-10 DIAGNOSIS — R0602 Shortness of breath: Secondary | ICD-10-CM | POA: Diagnosis not present

## 2016-05-10 DIAGNOSIS — R069 Unspecified abnormalities of breathing: Secondary | ICD-10-CM | POA: Diagnosis not present

## 2016-05-10 DIAGNOSIS — Z7952 Long term (current) use of systemic steroids: Secondary | ICD-10-CM | POA: Diagnosis not present

## 2016-05-10 DIAGNOSIS — Z87891 Personal history of nicotine dependence: Secondary | ICD-10-CM | POA: Insufficient documentation

## 2016-05-10 LAB — BASIC METABOLIC PANEL
ANION GAP: 10 (ref 5–15)
BUN: 8 mg/dL (ref 6–20)
CO2: 27 mmol/L (ref 22–32)
Calcium: 9.8 mg/dL (ref 8.9–10.3)
Chloride: 99 mmol/L — ABNORMAL LOW (ref 101–111)
Creatinine, Ser: 0.86 mg/dL (ref 0.44–1.00)
GFR calc Af Amer: >60 mL/min (ref >60)
GFR, EST NON AFRICAN AMERICAN: 59 mL/min — AB (ref >60)
GLUCOSE: 110 mg/dL — AB (ref 65–99)
POTASSIUM: 3.8 mmol/L (ref 3.5–5.1)
Sodium: 136 mmol/L (ref 135–145)

## 2016-05-10 LAB — URINALYSIS, ROUTINE W REFLEX MICROSCOPIC
Bilirubin Urine: NEGATIVE
Glucose, UA: NEGATIVE mg/dL
Hgb urine dipstick: NEGATIVE
KETONES UR: NEGATIVE mg/dL
LEUKOCYTES UA: NEGATIVE
NITRITE: NEGATIVE
PH: 8 (ref 5.0–8.0)
PROTEIN: NEGATIVE mg/dL
Specific Gravity, Urine: 1.01 (ref 1.005–1.030)

## 2016-05-10 LAB — CBC
HEMATOCRIT: 39.2 % (ref 36.0–46.0)
HEMOGLOBIN: 12.8 g/dL (ref 12.0–15.0)
MCH: 30.5 pg (ref 26.0–34.0)
MCHC: 32.7 g/dL (ref 30.0–36.0)
MCV: 93.6 fL (ref 78.0–100.0)
Platelets: 261 10*3/uL (ref 150–400)
RBC: 4.19 MIL/uL (ref 3.87–5.11)
RDW: 13 % (ref 11.5–15.5)
WBC: 6.7 10*3/uL (ref 4.0–10.5)

## 2016-05-10 LAB — I-STAT TROPONIN, ED: Troponin i, poc: 0.03 ng/mL (ref 0.00–0.08)

## 2016-05-10 LAB — BRAIN NATRIURETIC PEPTIDE: B NATRIURETIC PEPTIDE 5: 286.9 pg/mL — AB (ref 0.0–100.0)

## 2016-05-10 NOTE — Discharge Instructions (Signed)
°  There is no sign of heart failure, or pneumonia today. Your oxygen status is normal. If you continue to have trouble breathing, you can see a pulmonary physician. During hospitalization, you were seen by Dr. Nelda Marseille, a pulmonologist. Call if needed, for an appointment.   Shortness of Breath Shortness of breath means you have trouble breathing. Shortness of breath needs medical care right away. HOME CARE   Do not smoke.  Avoid being around chemicals or things (paint fumes, dust) that may bother your breathing.  Rest as needed. Slowly begin your normal activities.  Only take medicines as told by your doctor.  Keep all doctor visits as told. GET HELP RIGHT AWAY IF:   Your shortness of breath gets worse.  You feel lightheaded, pass out (faint), or have a cough that is not helped by medicine.  You cough up blood.  You have pain with breathing.  You have pain in your chest, arms, shoulders, or belly (abdomen).  You have a fever.  You cannot walk up stairs or exercise the way you normally do.  You do not get better in the time expected.  You have a hard time doing normal activities even with rest.  You have problems with your medicines.  You have any new symptoms. MAKE SURE YOU:  Understand these instructions.  Will watch your condition.  Will get help right away if you are not doing well or get worse.   This information is not intended to replace advice given to you by your health care provider. Make sure you discuss any questions you have with your health care provider.   Document Released: 05/17/2008 Document Revised: 12/04/2013 Document Reviewed: 02/14/2012 Elsevier Interactive Patient Education [employer].

## 2016-05-10 NOTE — ED Notes (Signed)
Per [facility], seen here last week for new onset afib and pneumonia/sob. Discharged on cardizem and xarelto. Woke up this morning with similar shortness of breath. 98% on RA. HR afib 88-120. Pt in NAD., AAOX4

## 2016-05-10 NOTE — ED Provider Notes (Signed)
CSN: [id number]     Arrival date & time 05/10/16  1106 History   First MD Initiated Contact with Patient 05/10/16 1109     Chief Complaint  Patient presents with  . Shortness of Breath     (Consider location/radiation/quality/duration/timing/severity/associated sxs/prior Treatment) The history is provided by the patient.     [name] is a 82 y.o. female here for evaluation of shortness of breath, which she noticed, upon awakening this morning. She was assessed by nursing, at her facility, and found to have a normal oxygen saturation on room air of 98%. He was placed on oxygen and transferred here for evaluation of shortness of breath. She states that she feels better while on the oxygen. She was recently hospitalized and treated for atrial fibrillation, rapid response with Cardizem, and was started on xarelto. She has an abnormal chest x-ray with nodularity, and has stated that she did not want to have aggressive evaluation or intervention, at this time. He was previously recommended that she have a follow-up CT scan in about 4 weeks. She denies fever, chills, chest pain, weakness or dizziness. She has no prior history of bronchitis, asthma or COPD. Is taking her usual medications. There are no other known modifying factors.   Past Medical History  Diagnosis Date  . Hypertension   . Vertigo   . RA (rheumatoid arthritis) ([place])   . RA (rheumatoid arthritis) ([place])   . New onset atrial fibrillation ([place]) 04/2016    a. started on Xarelto   Past Surgical History  Procedure Laterality Date  . Replacement total knee Right   . Shoulder surgery Bilateral   . Cervical spine surgery     No family history on file. Social History  Substance Use Topics  . Smoking status: Former Research scientist (life sciences)  . Smokeless tobacco: None  . Alcohol Use: No   OB History    No data available     Review of Systems  All other systems reviewed and are negative.     Allergies  Other  Home Medications    Prior to Admission medications   Medication Sig Start Date End Date Taking? Authorizing Provider  azithromycin (ZITHROMAX) 250 MG tablet Take 1 tablet (250 mg total) by mouth daily. 05/07/16   Theodis Blaze, MD  calcium carbonate (OS-CAL) 600 MG TABS Take 600 mg by mouth 2 (two) times daily with a meal.      Historical Provider, MD  cholecalciferol (VITAMIN D) 1000 UNITS tablet Take 2,500 Units by mouth daily.     Historical Provider, MD  diltiazem (CARDIZEM CD) 120 MG 24 hr capsule Take 1 capsule (120 mg total) by mouth daily. 05/07/16   Theodis Blaze, MD  docusate sodium (COLACE) 100 MG capsule Take 200 mg by mouth every evening.    Historical Provider, MD  enalapril (VASOTEC) 20 MG tablet Take 20 mg by mouth 2 (two) times daily.      Historical Provider, MD  hydroxychloroquine (PLAQUENIL) 200 MG tablet Take 200 mg by mouth 2 (two) times daily.      Historical Provider, MD  levalbuterol Penne Lash) 1.25 MG/0.5ML nebulizer solution Take 1.25 mg by nebulization every 8 (eight) hours as needed for wheezing or shortness of breath. 05/07/16   Theodis Blaze, MD  loratadine (CLARITIN) 10 MG tablet Take 10 mg by mouth daily.    Historical Provider, MD  Magnesium 250 MG TABS Take 250 mg by mouth daily.      Historical Provider, MD  metoprolol (  TOPROL-XL) 50 MG 24 hr tablet Take 50 mg by mouth 2 (two) times daily.      Historical Provider, MD  Omega-3 Fatty Acids (FISH OIL) 1000 MG CAPS Take 1 capsule by mouth every evening.     Historical Provider, MD  predniSONE (DELTASONE) 1 MG tablet Take 4 mg by mouth daily with breakfast.    Historical Provider, MD  Rivaroxaban (XARELTO) 15 MG TABS tablet Take 1 tablet (15 mg total) by mouth daily with supper. 05/07/16   Theodis Blaze, MD  vitamin C (ASCORBIC ACID) 500 MG tablet Take 500 mg by mouth daily.      Historical Provider, MD  Wheat Dextrin (BENEFIBER PO) Take 4 scoop by mouth daily.     Historical Provider, MD   BP 163/92 mmHg  Pulse 90  Temp(Src) 98.5 F  (36.9 C)  Resp 18  SpO2 100% Physical Exam  Constitutional: She is oriented to person, place, and time. She appears well-developed.  Elderly, frail  HENT:  Head: Normocephalic and atraumatic.  Right Ear: External ear normal.  Left Ear: External ear normal.  Eyes: Conjunctivae and EOM are normal. Pupils are equal, round, and reactive to light.  Neck: Normal range of motion and phonation normal. Neck supple.  Cardiovascular: Normal rate, regular rhythm and normal heart sounds.   Pulmonary/Chest: Effort normal and breath sounds normal. No respiratory distress. She has no wheezes. She exhibits no tenderness and no bony tenderness.  There is no increased work of breathing.  Abdominal: Soft. There is no tenderness.  Musculoskeletal: Normal range of motion.  Neurological: She is alert and oriented to person, place, and time. No cranial nerve deficit or sensory deficit. She exhibits normal muscle tone. Coordination normal.  Skin: Skin is warm, dry and intact.  Psychiatric: She has a normal mood and affect. Her behavior is normal.  Nursing note and vitals reviewed.   ED Course  Procedures (including critical care time)  Initial clinical impression- unspecified shortness of breath, with recent diagnosis of atrial fibrillation, presenting, with controlled heart rate and normal blood pressure. She does not appear to have an oxygen deficit, however, did feel better when placed on nasal cannula oxygen. She will be evaluated for heart failure, pneumonia, and observed.  Medications - No data to display  Patient Vitals for the past 24 hrs:  BP Temp Pulse Resp SpO2  05/10/16 1400 159/99 mmHg - 69 22 96 %  05/10/16 1345 168/89 mmHg - 94 22 96 %  05/10/16 1344 168/89 mmHg - 98 20 96 %  05/10/16 1300 158/86 mmHg - - 14 95 %  05/10/16 1245 158/90 mmHg - 89 23 95 %  05/10/16 1218 160/91 mmHg - 84 16 90 %  05/10/16 1145 161/89 mmHg - 72 17 97 %  05/10/16 1116 163/92 mmHg 98.5 F (36.9 C) 90 18 100 %     2:14 PM Reevaluation with update and discussion. After initial assessment and treatment, an updated evaluation reveals She is alert, comfortable, and nondyspneic, on room air, now with oxygen saturation 97%. Patient family members want to have a urine tested for urine infection. They were updated on the findings and plan. [place] PANEL  CBC  I-STAT [place], ED    Imaging Review No results found. I have personally reviewed and evaluated these images and lab results as part of my medical decision-making.   EKG Interpretation   Date/Time:  Monday May 10 2016 11:15:11 EDT  Ventricular Rate:  97 PR Interval:    QRS Duration: 101 QT Interval:  404 QTC Calculation: 513 R Axis:   48 Text Interpretation:  Atrial fibrillation Ventricular premature complex  Low voltage, extremity leads Prolonged QT interval Since last tracing QT  has lengthened Confirmed by Eulis Foster  MD, Yaziel Brandon [id number]) on 05/10/2016  11:51:43 AM      MDM   Final diagnoses:  None    Nonspecific dyspnea, transient, without evidence for pneumonia, heart failure or severe respiratory distress. Doubt PE. Urine culture pending, at discharge.   Nursing Notes Reviewed/ Care Coordinated Applicable Imaging Reviewed Interpretation of Laboratory Data incorporated into ED treatment  The patient appears reasonably screened and/or stabilized for discharge and I doubt any other medical condition or other [place] requiring further screening, evaluation, or treatment in the ED at this time prior to discharge.  Plan: Home Medications- usual; Home Treatments- rest; return here if the recommended treatment, does not improve the symptoms; Recommended follow up- PCP and Pulmonary 1 week   Daleen Bo, MD 05/11/16 [phone]

## 2016-05-10 NOTE — ED Notes (Signed)
Pt transported to xray 

## 2016-05-12 LAB — URINE CULTURE: SPECIAL REQUESTS: NORMAL

## 2016-05-13 ENCOUNTER — Ambulatory Visit (INDEPENDENT_AMBULATORY_CARE_PROVIDER_SITE_OTHER): Payer: PPO | Admitting: Physician Assistant

## 2016-05-13 ENCOUNTER — Telehealth: Payer: Self-pay | Admitting: *Deleted

## 2016-05-13 ENCOUNTER — Encounter: Payer: Self-pay | Admitting: Physician Assistant

## 2016-05-13 VITALS — BP 138/82 | HR 87 | Ht 61.0 in | Wt 151.4 lb

## 2016-05-13 DIAGNOSIS — I509 Heart failure, unspecified: Secondary | ICD-10-CM | POA: Diagnosis not present

## 2016-05-13 DIAGNOSIS — I480 Paroxysmal atrial fibrillation: Secondary | ICD-10-CM | POA: Diagnosis not present

## 2016-05-13 DIAGNOSIS — J189 Pneumonia, unspecified organism: Secondary | ICD-10-CM | POA: Diagnosis not present

## 2016-05-13 DIAGNOSIS — R5381 Other malaise: Secondary | ICD-10-CM

## 2016-05-13 DIAGNOSIS — R7301 Impaired fasting glucose: Secondary | ICD-10-CM | POA: Diagnosis not present

## 2016-05-13 DIAGNOSIS — I4891 Unspecified atrial fibrillation: Secondary | ICD-10-CM

## 2016-05-13 DIAGNOSIS — R0602 Shortness of breath: Secondary | ICD-10-CM | POA: Diagnosis not present

## 2016-05-13 MED ORDER — RIVAROXABAN 15 MG PO TABS: 15.0000 mg | ORAL_TABLET | Freq: Every day | ORAL | Status: DC

## 2016-05-13 MED ORDER — DILTIAZEM HCL ER COATED BEADS 120 MG PO CP24: 120.0000 mg | ORAL_CAPSULE | Freq: Every day | ORAL | Status: DC

## 2016-05-13 MED ORDER — METOPROLOL SUCCINATE ER 50 MG PO TB24: ORAL_TABLET | ORAL | Status: DC

## 2016-05-13 MED ORDER — FUROSEMIDE 20 MG PO TABS: 20.0000 mg | ORAL_TABLET | Freq: Every day | ORAL | Status: DC | PRN

## 2016-05-13 MED ORDER — POTASSIUM CHLORIDE CRYS ER 20 MEQ PO TBCR: 20.0000 meq | EXTENDED_RELEASE_TABLET | Freq: Every day | ORAL | Status: DC | PRN

## 2016-05-13 NOTE — ED Notes (Signed)
Post ED Visit - Positive Culture Follow-up  Culture report reviewed by antimicrobial stewardship pharmacist:  []  Elenor Quinones, Pharm.D. []  Heide Guile, Pharm.D., BCPS []  Parks Neptune, Pharm.D. []  Alycia Rossetti, Pharm.D., BCPS []  [place], Pharm.D., BCPS, AAHIVP []  Legrand Como, Pharm.D., BCPS, AAHIVP []  Milus Glazier, Pharm.D. []  Stephens November, Pharm.D.  Positive urine culture No further patient follow-up is required at this time per Domenic Moras, PA-C  Ardeen Fillers 05/13/2016, 1:10 PM

## 2016-05-13 NOTE — Progress Notes (Signed)
Cardiology Office Note   Date:  05/13/2016   ID:  ARYBELLA Lamb, DOB 1928/07/05, MRN [id number]  PCP:  Katherine Coma, MD  Cardiologist:  Dr Martinique  Lamb, Rhonda, PA-C   No chief complaint on file.   History of Present Illness: [name] is a 82 y.o. female with a history of HTN, vertigo, TIA, and RA. Hx nl EF and grade 2 dd by echo  D/c 05/26 after admission for PNA, pleural effusions, found to have afib (prev episode in 2005), CHADS2VASC=7, started on Xarelto and Cardizem CD 120 mg qd. Still in afib at d/c.  [name] presents for post hospital evaluation.  [name] is living at [facility] Prior to her hospitalization, she was independent living. She was moved to assisted living after discharge and is there still. They have suggested that she get a physical therapy evaluation.  Her major problem is shortness of breath. She has also been waking up with shortness of breath in the early morning. She sleeps better in a chair, describing classic orthopnea as well as PND. She can walk very little without getting short of breath and fatigue. She is not aware of what her heart rate is, never getting palpitations. She has not had chest pain.  Her daughter and her daughter-in-law are with her today. They are concerned because her chest x-ray was abnormal. She has seen a pulmonologist and was told that it was over read. Under her medications, the amlodipine is crossed off but it is still on her medication record. Because of the atrial fibrillation, she was placed on Cardizem. She is on Xarelto, and tolerating it well. If her shortness of breath improved, she would have no complaints.   Past Medical History  Diagnosis Date  . Hypertension   . Vertigo   . RA (rheumatoid arthritis) ([place])   . RA (rheumatoid arthritis) ([place])   . New onset atrial fibrillation ([place]) 04/2016    a. started on Xarelto and Cardizem    Past Surgical History  Procedure Laterality Date    . Replacement total knee Right   . Shoulder surgery Bilateral   . Cervical spine surgery      Current Outpatient Prescriptions  Medication Sig Dispense Refill  . azithromycin (ZITHROMAX) 250 MG tablet Take 1 tablet (250 mg total) by mouth daily. 3 each 0  . calcium carbonate (OS-CAL) 600 MG TABS Take 600 mg by mouth 2 (two) times daily with a meal.      . cholecalciferol (VITAMIN D) 1000 UNITS tablet Take 2,500 Units by mouth daily.     Marland Kitchen diltiazem (CARDIZEM CD) 120 MG 24 hr capsule Take 1 capsule (120 mg total) by mouth daily. 30 capsule 1  . docusate sodium (COLACE) 100 MG capsule Take 200 mg by mouth every evening.    . enalapril (VASOTEC) 20 MG tablet Take 20 mg by mouth 2 (two) times daily.      . hydroxychloroquine (PLAQUENIL) 200 MG tablet Take 200 mg by mouth 2 (two) times daily.      Marland Kitchen levalbuterol (XOPENEX) 1.25 MG/0.5ML nebulizer solution Take 1.25 mg by nebulization every 8 (eight) hours as needed for wheezing or shortness of breath. 1 each 5  . loratadine (CLARITIN) 10 MG tablet Take 10 mg by mouth daily.    . Magnesium 250 MG TABS Take 250 mg by mouth daily.      . metoprolol (TOPROL-XL) 50 MG 24 hr tablet Take 50 mg by mouth 2 (  two) times daily.      . Omega-3 Fatty Acids (FISH OIL) 1000 MG CAPS Take 1 capsule by mouth every evening.     . predniSONE (DELTASONE) 1 MG tablet Take 4 mg by mouth daily with breakfast.    . Rivaroxaban (XARELTO) 15 MG TABS tablet Take 1 tablet (15 mg total) by mouth daily with supper. 30 tablet 0  . vitamin C (ASCORBIC ACID) 500 MG tablet Take 500 mg by mouth daily.      . Wheat Dextrin (BENEFIBER PO) Take 4 scoop by mouth daily.      No current facility-administered medications for this visit.    Allergies:   Other    Social History:  The patient  reports that she has quit smoking. She does not have any smokeless tobacco history on file. She reports that she does not drink alcohol or use illicit drugs.   Family History:  The patient's  family history is not on file.    ROS:  Please see the history of present illness. All other systems are reviewed and negative.    PHYSICAL EXAM: VS:  BP 138/82 mmHg  Pulse 87  Ht 5\' 1"  (1.549 m)  Wt 151 lb 6.4 oz (68.675 kg)  BMI 28.62 kg/m2 , BMI Body mass index is 28.62 kg/(m^2). GEN: Well nourished, well developed, female in no acute distress HEENT: normal for age  Neck: JVD at 9 cm, positive hepatojugular reflux, no carotid bruit, no masses Cardiac: Irregular rate and rhythm; soft murmur, no rubs, or gallops Respiratory:  Decreased breath sounds bases bilaterally, normal work of breathing GI: soft, nontender, nondistended, + BS MS: no deformity or atrophy; no edema; distal pulses are 2+ in all 4 extremities  Skin: warm and dry, no rash Neuro:  Strength and sensation are intact Psych: euthymic mood, full affect   EKG:  EKG is ordered today. The ekg ordered today demonstrates atrial fibrillation, controlled ventricular rate at 87  ECHO: 05/04/2016 - Left ventricle: The cavity size was normal. Wall thickness was  increased in a pattern of mild LVH. Systolic function was normal.  The estimated ejection fraction was in the range of 60% to 65%.  Wall motion was normal; there were no regional wall motion  abnormalities. - Mitral valve: Mildly calcified annulus. There was moderate  regurgitation. - Right atrium: The atrium was mildly dilated. - Pulmonary arteries: Systolic pressure was mildly increased. PA  peak pressure: 32 mm Hg (S).  Recent Labs: 05/03/2016: ALT 23 05/04/2016: TSH 1.522 05/05/2016: Magnesium 2.0 05/10/2016: B Natriuretic Peptide 286.9*; BUN 8; Creatinine, Ser 0.86; Hemoglobin 12.8; Platelets 261; Potassium 3.8; Sodium 136    Lipid Panel    Component Value Date/Time   CHOL 160 05/04/2016 0303   TRIG 64 05/04/2016 0303   HDL 66 05/04/2016 0303   CHOLHDL 2.4 05/04/2016 0303   VLDL 13 05/04/2016 0303   LDLCALC 81 05/04/2016 0303     Wt Readings  from Last 3 Encounters:  05/13/16 151 lb 6.4 oz (68.675 kg)  05/07/16 153 lb 11.2 oz (69.718 kg)  06/20/15 143 lb (64.864 kg)     Other studies Reviewed: Additional studies/ records that were reviewed today include: Hospital records and testing.  ASSESSMENT AND PLAN:  1.  Atrial fibrillation: Part of her shortness of breath may be coming from the atrial fibrillation. Her heart rate may be going up when she is exerting herself and this may be contributing to her shortness of breath. We will try increasing her  morning dose of Toprol-XL from 50 mg to 75 mg daily.   I discussed the various options with the family. I advised that for her to get a cardioversion now, she would need a TEE. The patient does not want this. I advised that after she has been on the Xarelto for 30 days, we can schedule an outpatient cardioversion. The patient is willing to consider this. The daughters wonder about an antiarrhythmic, but I advised that it is more normal to try cardioversion on her current medication including a calcium blocker and beta blocker before we add an antiarrhythmic. They are in agreement with cardioversion in a month.  2. Dyspnea on exertion: She also describes orthopnea and PND. She has mild JVD and a positive hepatojugular reflux. We will give her a low dose of Lasix with potassium for 2 days. If her symptoms do not improve, they may be secondary to the atrial fibrillation.  3. Deconditioning: She may have shortness of breath from deconditioning. She also gets tired if she has to stand on her feet for very long. She will benefit from physical therapy. On evaluation has been ordered.  Plan: The patient wonders when she can go back to independent living. I felt that she could manage her medications and be compliant with them. The patient and her family are in agreement. I advised that if all she needed was help bathing and dressing and getting her meals, she could possibly go back into independent  living soon as long as she had an aide or other assistance.   Current medicines are reviewed at length with the patient today.  The patient does not have concerns regarding medicines.  The following changes have been made:  Increase a.m. dose of Toprol-XL, stop amlodipine, take potassium 20 mEq and Lasix 20 mg daily for 2 days and then as needed  Labs/ tests ordered today include:   Orders Placed This Encounter  Procedures  . CBC  . Basic metabolic panel  . EKG 12-Lead     Disposition:   FU with Dr. Martinique  Signed, Rosaria Ferries, PA-C  05/13/2016 4:52 PM    [place] Phone: [phone]; Fax: [phone]  This note was written with the assistance of speech recognition software. Please excuse any transcriptional errors.

## 2016-05-13 NOTE — Patient Instructions (Signed)
Medication Instructions: Rosaria Ferries, PA-C, has recommended making the following medication changes: 1. STOP Amlodipine 2. START Furosemide 20 mg - take 1 tablet daily for 2 days then take 1 tablet daily as needed for weight gain of 3 pounds or 5 pounds in 1 week 3. START Potassium 20 mEq - take 1 tablet daily for 2 days then take 1 tablet daily as needed for weight gain of 3 pounds or 5 pounds in 1 week 4. INCREASE Metoprolol tartrate - take 1.5 tablets (75 mg total) in the morning and take 1 tablet (50 mg total) in the evening  Labwork: Your physician recommends that you return for lab work in 1 week.  Testing/Procedures: 1. PHYSICAL THERAPY - evaluate and treat due to deconditioning  Follow-up: Suanne Marker recommends that you schedule a follow-up appointment in 1 month with Dr Martinique.  If you need a refill on your cardiac medications before your next appointment, please call your pharmacy.   Your physician recommends that you weigh, daily, at the same time every day, and in the same amount of clothing. Please record your daily weights on the handout provided and bring it to your next appointment.

## 2016-05-13 NOTE — Progress Notes (Signed)
ED Antimicrobial Stewardship Positive Culture Follow Up   [name] is an 82 y.o. female who presented to [facility] on 05/10/2016 with a chief complaint of  Chief Complaint  Patient presents with  . Shortness of Breath    Recent Results (from the past 720 hour(s))  Blood culture (routine x 2)     Status: None   Collection Time: 05/03/16 10:05 PM  Result Value Ref Range Status   Specimen Description BLOOD RIGHT ANTECUBITAL  Final   Special Requests BOTTLES DRAWN AEROBIC AND ANAEROBIC 5CC  Final   Culture NO GROWTH 5 DAYS  Final   Report Status 05/08/2016 FINAL  Final  Blood culture (routine x 2)     Status: None   Collection Time: 05/03/16 10:26 PM  Result Value Ref Range Status   Specimen Description BLOOD RIGHT HAND  Final   Special Requests BOTTLES DRAWN AEROBIC ONLY 5CC  Final   Culture NO GROWTH 5 DAYS  Final   Report Status 05/08/2016 FINAL  Final  Urine culture     Status: Abnormal   Collection Time: 05/10/16  2:12 PM  Result Value Ref Range Status   Specimen Description URINE, CLEAN CATCH  Final   Special Requests Normal  Final   Culture 50,000 COLONIES/mL ENTEROCOCCUS SPECIES (A)  Final   Report Status 05/12/2016 FINAL  Final   Organism ID, Bacteria ENTEROCOCCUS SPECIES (A)  Final      Susceptibility   Enterococcus species - MIC*    AMPICILLIN <=2 SENSITIVE Sensitive     LEVOFLOXACIN 0.5 SENSITIVE Sensitive     NITROFURANTOIN <=16 SENSITIVE Sensitive     VANCOMYCIN 1 SENSITIVE Sensitive     * 50,000 COLONIES/mL ENTEROCOCCUS SPECIES     No urinary symptoms. UA completely negative. No treatment.  ED Provider: Domenic Moras, PA-C  Cassie L. Nicole Kindred, PharmD PGY2 Infectious Diseases Pharmacy Resident Pager: [phone] 05/13/2016 10:34 AM

## 2016-05-14 ENCOUNTER — Encounter: Payer: Self-pay | Admitting: Physician Assistant

## 2016-05-17 DIAGNOSIS — M5412 Radiculopathy, cervical region: Secondary | ICD-10-CM | POA: Diagnosis not present

## 2016-05-17 DIAGNOSIS — M19012 Primary osteoarthritis, left shoulder: Secondary | ICD-10-CM | POA: Diagnosis not present

## 2016-05-17 DIAGNOSIS — G458 Other transient cerebral ischemic attacks and related syndromes: Secondary | ICD-10-CM | POA: Diagnosis not present

## 2016-05-17 DIAGNOSIS — I10 Essential (primary) hypertension: Secondary | ICD-10-CM | POA: Diagnosis not present

## 2016-05-17 DIAGNOSIS — R2681 Unsteadiness on feet: Secondary | ICD-10-CM | POA: Diagnosis not present

## 2016-05-17 DIAGNOSIS — I4891 Unspecified atrial fibrillation: Secondary | ICD-10-CM | POA: Diagnosis not present

## 2016-05-17 DIAGNOSIS — M6281 Muscle weakness (generalized): Secondary | ICD-10-CM | POA: Diagnosis not present

## 2016-05-17 DIAGNOSIS — G9009 Other idiopathic peripheral autonomic neuropathy: Secondary | ICD-10-CM | POA: Diagnosis not present

## 2016-05-17 DIAGNOSIS — M069 Rheumatoid arthritis, unspecified: Secondary | ICD-10-CM | POA: Diagnosis not present

## 2016-05-17 DIAGNOSIS — I110 Hypertensive heart disease with heart failure: Secondary | ICD-10-CM | POA: Diagnosis not present

## 2016-05-19 ENCOUNTER — Telehealth: Payer: Self-pay

## 2016-05-19 ENCOUNTER — Telehealth: Payer: Self-pay | Admitting: Cardiology

## 2016-05-19 NOTE — Telephone Encounter (Signed)
Returned call to patient's daughter who said she was returning a call to [place] about an appointment. Notified patient's daughter Jan that her mother's appt with Monday July 10 @ 2:45pm w/Dr. Martinique.   Routed to [place] as [employer]

## 2016-05-19 NOTE — Telephone Encounter (Signed)
New Message  Pt dtr stated is returning RNphone call. Please call back and discuss.

## 2016-05-20 ENCOUNTER — Telehealth: Payer: Self-pay | Admitting: Physician Assistant

## 2016-05-20 NOTE — Telephone Encounter (Signed)
New Message   Pt c/o medication issue:  1. Name of Medication: Metroprolol  2. How are you currently taking this medication (dosage and times per day)? Directed to take 1 1/2 in the morning; 1 at bed time 50mg   3. Are you having a reaction (difficulty breathing--STAT)? no   4. What is your medication issue? Lattie Haw from [facility] calling to clarify therapy for med? Please call back to discuss

## 2016-05-20 NOTE — Telephone Encounter (Signed)
Returned call to patient's daughter Jan.Appointment with Dr.Jordan cancelled for 06/21/16 due to daughter going out of town.Follow up appointment scheduled with Rosaria Ferries PA 06/17/16 at 2:00 pm.

## 2016-05-20 NOTE — Telephone Encounter (Signed)
New message    Patient daughter calling the week of July 10- 14  out of town for that week can she be work in another day after in the pm.

## 2016-05-20 NOTE — Telephone Encounter (Signed)
I called [name] at [employer] and left detailed message regarding this medication and its parameters. Advised to call back if any clarification required.

## 2016-05-20 NOTE — Telephone Encounter (Signed)
Caller is on DPR list. msg sent to scheduling to arrange.

## 2016-06-02 DIAGNOSIS — I480 Paroxysmal atrial fibrillation: Secondary | ICD-10-CM | POA: Diagnosis not present

## 2016-06-02 DIAGNOSIS — R0602 Shortness of breath: Secondary | ICD-10-CM | POA: Diagnosis not present

## 2016-06-07 ENCOUNTER — Encounter: Payer: Self-pay | Admitting: *Deleted

## 2016-06-09 ENCOUNTER — Encounter: Payer: Self-pay | Admitting: *Deleted

## 2016-06-14 DIAGNOSIS — M069 Rheumatoid arthritis, unspecified: Secondary | ICD-10-CM | POA: Diagnosis not present

## 2016-06-14 DIAGNOSIS — M19012 Primary osteoarthritis, left shoulder: Secondary | ICD-10-CM | POA: Diagnosis not present

## 2016-06-14 DIAGNOSIS — I10 Essential (primary) hypertension: Secondary | ICD-10-CM | POA: Diagnosis not present

## 2016-06-14 DIAGNOSIS — G458 Other transient cerebral ischemic attacks and related syndromes: Secondary | ICD-10-CM | POA: Diagnosis not present

## 2016-06-14 DIAGNOSIS — M6281 Muscle weakness (generalized): Secondary | ICD-10-CM | POA: Diagnosis not present

## 2016-06-14 DIAGNOSIS — I4891 Unspecified atrial fibrillation: Secondary | ICD-10-CM | POA: Diagnosis not present

## 2016-06-14 DIAGNOSIS — I110 Hypertensive heart disease with heart failure: Secondary | ICD-10-CM | POA: Diagnosis not present

## 2016-06-14 DIAGNOSIS — R2681 Unsteadiness on feet: Secondary | ICD-10-CM | POA: Diagnosis not present

## 2016-06-14 DIAGNOSIS — M5412 Radiculopathy, cervical region: Secondary | ICD-10-CM | POA: Diagnosis not present

## 2016-06-14 DIAGNOSIS — G9009 Other idiopathic peripheral autonomic neuropathy: Secondary | ICD-10-CM | POA: Diagnosis not present

## 2016-06-17 ENCOUNTER — Ambulatory Visit (INDEPENDENT_AMBULATORY_CARE_PROVIDER_SITE_OTHER): Payer: PPO | Admitting: Physician Assistant

## 2016-06-17 ENCOUNTER — Encounter: Payer: Self-pay | Admitting: Cardiology

## 2016-06-17 ENCOUNTER — Encounter: Payer: Self-pay | Admitting: Physician Assistant

## 2016-06-17 VITALS — BP 166/96 | HR 82 | Ht 61.0 in | Wt 152.4 lb

## 2016-06-17 DIAGNOSIS — Z01812 Encounter for preprocedural laboratory examination: Secondary | ICD-10-CM

## 2016-06-17 DIAGNOSIS — I4819 Other persistent atrial fibrillation: Secondary | ICD-10-CM

## 2016-06-17 DIAGNOSIS — I5032 Chronic diastolic (congestive) heart failure: Secondary | ICD-10-CM

## 2016-06-17 DIAGNOSIS — Z7901 Long term (current) use of anticoagulants: Secondary | ICD-10-CM

## 2016-06-17 DIAGNOSIS — I481 Persistent atrial fibrillation: Secondary | ICD-10-CM

## 2016-06-17 DIAGNOSIS — R5383 Other fatigue: Secondary | ICD-10-CM

## 2016-06-17 HISTORY — DX: Chronic diastolic (congestive) heart failure: I50.32

## 2016-06-17 NOTE — Patient Instructions (Addendum)
Your physician has recommended you make the following change in your medication...  1. INCREASE metoprolol succinate to 100mg  every morning and 50mg  every evening 2. TAKE lasix 40mg  on Friday July 7 - then take as needed as previously directed  Listed on [place]  -- Use oxygen 2LPM PRN -- Can use tylenol PRN   Your physician has recommended that you have a Cardioversion (DCCV) when Dr. Martinique is available to do the procedure. Electrical Cardioversion uses a jolt of electricity to your heart either through paddles or wired patches attached to your chest. This is a controlled, usually prescheduled, procedure. Defibrillation is done under light anesthesia in the hospital, and you usually go home the day of the procedure. This is done to get your heart back into a normal rhythm. You are not awake for the procedure. Please see the instruction sheet given to you today.  You will need to have lab work done no more than 14 days prior to your hospital procedure.  - If [facility] can do the blood work, please have the labs done at least 5 days prior to your procedure  ------- please have the results faxed to [phone] - If you want to have the labs done elsewhere, there is a lab on the first floor of the cardiology office building in [address]. No appointment is needed.   Your physician recommends that you schedule a follow-up appointment 2-3 weeks after the cardioversion procedure.   Electrical Cardioversion Electrical cardioversion is the delivery of a jolt of electricity to change the rhythm of the heart. Sticky patches or metal paddles are placed on the chest to deliver the electricity from a device. This is done to restore a normal rhythm. A rhythm that is too fast or not regular keeps the heart from pumping well. Electrical cardioversion is done in an emergency if:   There is low or no blood pressure as a result of the heart rhythm.   Normal rhythm must be restored as  fast as possible to protect the brain and heart from further damage.   It may save a life. Cardioversion may be done for heart rhythms that are not immediately life threatening, such as atrial fibrillation or flutter, in which:   The heart is beating too fast or is not regular.   Medicine to change the rhythm has not worked.   It is safe to wait in order to allow time for preparation.  Symptoms of the abnormal rhythm are bothersome.  The risk of stroke and other serious problems can be reduced. LET [facility] CARE PROVIDER KNOW ABOUT:   Any allergies you have.  All medicines you are taking, including vitamins, herbs, eye drops, creams, and over-the-counter medicines.  Previous problems you or members of your family have had with the use of anesthetics.   Any blood disorders you have.   Previous surgeries you have had.   Medical conditions you have. RISKS AND COMPLICATIONS  Generally, this is a safe procedure. However, problems can occur and include:   Breathing problems related to the anesthetic used.  A blood clot that breaks free and travels to other parts of your body. This could cause a stroke or other problems. The risk of this is lowered by use of blood-thinning medicine (anticoagulant) prior to the procedure.  Cardiac arrest (rare). BEFORE THE PROCEDURE   You may have tests to detect blood clots in your heart and to evaluate heart function.  You may start taking  anticoagulants so your blood does not clot as easily.   Medicines may be given to help stabilize your heart rate and rhythm. PROCEDURE  You will be given medicine through an IV tube to reduce discomfort and make you sleepy (sedative).   An electrical shock will be delivered. AFTER THE PROCEDURE Your heart rhythm will be watched to make sure it does not change.    This information is not intended to replace advice given to you by your health care provider. Make sure you discuss any questions  you have with your health care provider.   Document Released: 11/19/2002 Document Revised: 12/20/2014 Document Reviewed: 06/13/2013 Elsevier Interactive Patient Education [employer].

## 2016-06-17 NOTE — Progress Notes (Signed)
Cardiology Office Note   Date:  06/17/2016   ID:  [name], DOB 07/05/1928, MRN [id number]  PCP:  Peter Martinique, MD  Cardiologist:  Dr Martinique  Katherine Buffkin, PA-C   Chief Complaint  Patient presents with  . Follow-up  . not sleeping well  . Shortness of Breath    History of Present Illness: [name] is a 82 y.o. female with a history of HTN, vertigo, TIA, and RA. Hx nl EF and grade 2 dd by echo. PAF dx 04/2016, on Xarelto and Cardizem, CHADS2VASC=5 (HTN, CHF, female, age x 2)  Seen 06/01 and was in afib, BB increased 50>75mg . Pt to stay on anticoag x 30 days and consider DCCV.   [name] presents for follow-up of her atrial fibrillation.  She continues to feel weak and washed out. She ambulates very little because her legs feel so weak. She also has dyspnea on exertion. She has some lower extremity edema, but is not being weight consistently. It is hard to tell exactly what her volume status is from her weights. [facility] and has to push her self to walk to the dining room and back for meals. She otherwise uses a wheelchair for ambulation. She continues to feel poorly and wonders what we can do. She is tolerating the Eliquis well and has not missed any doses.  Because she is now in assisted living, they need some help with some of her medications. Her doctor wants to put her on Remeron and her daughter wants to make sure that is okay from a cardiac standpoint. Additionally, she needs an order for daily weights which are not being done. Her daughter was to make sure heart healthy diet as appropriate. She needs an order for Tylenol because of the difference between being in an assisted living facility versus independent living. [name] also feels like she needs oxygen. However, she has not had any documented hypoxia, so the facility is concerned about the oxygen. They are requesting that she get an order for the oxygen.    Past  Medical History  Diagnosis Date  . Hypertension   . Vertigo   . RA (rheumatoid arthritis) ([place])   . RA (rheumatoid arthritis) ([place])   . New onset atrial fibrillation ([place]) 04/2016    a. started on Xarelto and Cardizem  . CHF (congestive heart failure) ([place])   . SOB (shortness of breath) 04/2016    Hospitalist    Past Surgical History  Procedure Laterality Date  . Replacement total knee Right 1996    [place] MD  . Shoulder surgery Bilateral 2005,2008,2009    Baird Lyons MD  . Cervical spine surgery  2005    Dr. Vertell Limber    Current Outpatient Prescriptions  Medication Sig Dispense Refill  . calcium carbonate (OS-CAL) 600 MG TABS Take 800 mg by mouth 2 (two) times daily with a meal.     . cholecalciferol (VITAMIN D) 1000 UNITS tablet Take 2,500 Units by mouth daily.     Marland Kitchen diltiazem (CARDIZEM CD) 120 MG 24 hr capsule Take 1 capsule (120 mg total) by mouth daily. 90 capsule 3  . docusate sodium (COLACE) 100 MG capsule Take 200 mg by mouth every evening.    . enalapril (VASOTEC) 20 MG tablet Take 20 mg by mouth 2 (two) times daily.      . furosemide (LASIX) 20 MG tablet Take 1 tablet (20 mg total) by mouth daily as  needed (for weight gain 3 lbs in one day or 5 lbs in one week). 90 tablet 3  . loratadine (CLARITIN) 10 MG tablet Take 10 mg by mouth daily.    . Magnesium 250 MG TABS Take 250 mg by mouth daily.      . metoprolol succinate (TOPROL-XL) 50 MG 24 hr tablet Take 1.5 tablets (75 ng total) by mouth every morning and 1 tablet (50 mg total) by mouth every evening. 225 tablet 3  . potassium chloride SA (KLOR-CON M20) 20 MEQ tablet Take 1 tablet (20 mEq total) by mouth daily as needed (for weight gain 3 lbs in one day or 5 lbs in one week). 90 tablet 3  . predniSONE (DELTASONE) 1 MG tablet Take 4 mg by mouth daily with breakfast.    . Rivaroxaban (XARELTO) 15 MG TABS tablet Take 1 tablet (15 mg total) by mouth daily with supper. 90 tablet 3  . vitamin C (ASCORBIC ACID) 500 MG  tablet Take 500 mg by mouth daily.      . Wheat Dextrin (BENEFIBER PO) Take 4 scoop by mouth daily.      No current facility-administered medications for this visit.    Allergies:   Other    Social History:  The patient  reports that she has quit smoking. She does not have any smokeless tobacco history on file. She reports that she does not drink alcohol or use illicit drugs.   Family History:  The patient's family history includes Arthritis in her daughter and son; Hypertension in her son; Hypothyroidism in her daughter.    ROS:  Please see the history of present illness. All other systems are reviewed and negative.    PHYSICAL EXAM: VS:  BP 166/96 mmHg  Pulse 82  Ht 5\' 1"  (1.549 m)  Wt 152 lb 6.4 oz (69.128 kg)  BMI 28.81 kg/m2 , BMI Body mass index is 28.81 kg/(m^2). GEN: Well nourished, well developed, female in no acute distress HEENT: normal for age  Neck: JVD at 8 cm, no carotid bruit, no masses Cardiac: Irregular rate and rhythm; soft murmur, no rubs, or gallops Respiratory: Decreased breath sounds bases bilaterally, normal work of breathing GI: soft, nontender, nondistended, + BS MS: no deformity or atrophy; trace edema; distal pulses are 2+ in all 4 extremities  Skin: warm and dry, no rash Neuro:  Strength and sensation are intact Psych: euthymic mood, full affect   EKG:  EKG is ordered today. The ekg ordered today demonstrates atrial fibrillation, ventricular rate 95 bpm, low voltage QRS in the inferior leads, different from 05/13/2016   Recent Labs: 05/03/2016: ALT 23 05/04/2016: TSH 1.522 05/05/2016: Magnesium 2.0 05/10/2016: B Natriuretic Peptide 286.9*; BUN 8; Creatinine, Ser 0.86; Hemoglobin 12.8; Platelets 261; Potassium 3.8; Sodium 136    Lipid Panel    Component Value Date/Time   CHOL 160 05/04/2016 0303   TRIG 64 05/04/2016 0303   HDL 66 05/04/2016 0303   CHOLHDL 2.4 05/04/2016 0303   VLDL 13 05/04/2016 0303   LDLCALC 81 05/04/2016 0303     Wt  Readings from Last 3 Encounters:  06/17/16 152 lb 6.4 oz (69.128 kg)  05/13/16 151 lb 6.4 oz (68.675 kg)  05/07/16 153 lb 11.2 oz (69.718 kg)     Other studies Reviewed: Additional studies/ records that were reviewed today include: Office notes and other testing.  ASSESSMENT AND PLAN:  1.  Persistent atrial fibrillation: I believe that some of her symptoms are coming from the persistent atrial  fibrillation. She has been consistently anticoagulated for over 30 days.   Therefore, we can go ahead and schedule her cardioversion without a TEE. We will increase her metoprolol for better rate control.  On echocardiogram 05/04/2016, her left atrium was 39 mm and her right atrium was mildly dilated.   Because of her age and general frailty, I will not use an antiarrhythmic at this time, but will start one at Dr. Doug Sou discretion.  2. Chronic anticoagulation: her CHADS2VASC=7, she is compliant with her Xarelto. No bleeding issues.  3. Chronic diastolic CHF: Her weight is minimally changed but she has some mild volume overload by exam. He is taking Lasix 20 mg daily when necessary for weight gain or edema. I will give her one dose of Lasix 40 mg. Continue other Lasix dosing as needed.   Current medicines are reviewed at length with the patient today.  The patient does not have concerns regarding medicines.  The following changes have been made:  Increase metoprolol 200 mg a.m., 50 mg p.m.  Labs/ tests ordered today include:    Orders Placed This Encounter  Procedures  . ELECTRICAL CARDIOVERSION  . APTT  . Basic metabolic panel  . CBC  . Protime-INR  . TSH  . EKG 12-Lead     Disposition:   FU with Dr. Martinique   Signed, Rosaria Ferries, PA-C  06/17/2016 5:45 PM    [place] Phone: [phone]; Fax: [phone]  This note was written with the assistance of speech recognition software. Please excuse any transcriptional errors.

## 2016-06-18 ENCOUNTER — Other Ambulatory Visit: Payer: Self-pay | Admitting: *Deleted

## 2016-06-18 DIAGNOSIS — I4891 Unspecified atrial fibrillation: Secondary | ICD-10-CM

## 2016-06-21 ENCOUNTER — Ambulatory Visit: Payer: PPO | Admitting: Cardiology

## 2016-07-01 ENCOUNTER — Encounter: Payer: Self-pay | Admitting: Nurse Practitioner

## 2016-07-01 DIAGNOSIS — Z0182 Encounter for allergy testing: Secondary | ICD-10-CM | POA: Diagnosis not present

## 2016-07-01 DIAGNOSIS — R5383 Other fatigue: Secondary | ICD-10-CM | POA: Diagnosis not present

## 2016-07-01 DIAGNOSIS — D689 Coagulation defect, unspecified: Secondary | ICD-10-CM | POA: Diagnosis not present

## 2016-07-01 NOTE — Progress Notes (Signed)
This encounter was created in error - please disregard.

## 2016-07-07 ENCOUNTER — Encounter: Payer: Self-pay | Admitting: Physician Assistant

## 2016-07-08 ENCOUNTER — Encounter (HOSPITAL_COMMUNITY)
Admission: RE | Disposition: A | Discharge status: Home / Self Care | Payer: Self-pay | Source: Ambulatory Visit | Attending: Cardiology

## 2016-07-08 ENCOUNTER — Ambulatory Visit (HOSPITAL_COMMUNITY)
Admission: RE | Admit: 2016-07-08 | Discharge: 2016-07-08 | Disposition: A | Discharge status: Home / Self Care | Payer: PPO | Source: Ambulatory Visit | Attending: Cardiology | Admitting: Cardiology

## 2016-07-08 ENCOUNTER — Ambulatory Visit (HOSPITAL_COMMUNITY): Payer: PPO | Admitting: Anesthesiology

## 2016-07-08 ENCOUNTER — Encounter (HOSPITAL_COMMUNITY): Payer: Self-pay | Admitting: *Deleted

## 2016-07-08 DIAGNOSIS — I4891 Unspecified atrial fibrillation: Secondary | ICD-10-CM | POA: Diagnosis not present

## 2016-07-08 DIAGNOSIS — M069 Rheumatoid arthritis, unspecified: Secondary | ICD-10-CM | POA: Diagnosis not present

## 2016-07-08 DIAGNOSIS — Z87891 Personal history of nicotine dependence: Secondary | ICD-10-CM | POA: Diagnosis not present

## 2016-07-08 DIAGNOSIS — Z7901 Long term (current) use of anticoagulants: Secondary | ICD-10-CM | POA: Diagnosis not present

## 2016-07-08 DIAGNOSIS — I110 Hypertensive heart disease with heart failure: Secondary | ICD-10-CM | POA: Insufficient documentation

## 2016-07-08 DIAGNOSIS — I481 Persistent atrial fibrillation: Secondary | ICD-10-CM | POA: Diagnosis not present

## 2016-07-08 DIAGNOSIS — I509 Heart failure, unspecified: Secondary | ICD-10-CM | POA: Insufficient documentation

## 2016-07-08 DIAGNOSIS — M1990 Unspecified osteoarthritis, unspecified site: Secondary | ICD-10-CM | POA: Insufficient documentation

## 2016-07-08 HISTORY — PX: CARDIOVERSION: SHX1299

## 2016-07-08 HISTORY — DX: Other specified postprocedural states: R11.2

## 2016-07-08 HISTORY — DX: Other complications of anesthesia, initial encounter: T88.59XA

## 2016-07-08 HISTORY — DX: Other specified postprocedural states: Z98.890

## 2016-07-08 HISTORY — DX: Adverse effect of unspecified anesthetic, initial encounter: T41.45XA

## 2016-07-08 SURGERY — CARDIOVERSION
Anesthesia: General

## 2016-07-08 MED ORDER — LIDOCAINE HCL (CARDIAC) 20 MG/ML IV SOLN
INTRAVENOUS | Status: DC | PRN
  Administered 2016-07-08: 40 mg via INTRAVENOUS

## 2016-07-08 MED ORDER — PROPOFOL 10 MG/ML IV BOLUS
INTRAVENOUS | Status: DC | PRN
  Administered 2016-07-08: 40 mg via INTRAVENOUS

## 2016-07-08 MED ORDER — SODIUM CHLORIDE 0.9 % IV SOLN
INTRAVENOUS | Status: DC | PRN
  Administered 2016-07-08: 12:00:00 via INTRAVENOUS

## 2016-07-08 MED ORDER — SODIUM CHLORIDE 0.9 % IV SOLN
INTRAVENOUS | Status: DC
  Administered 2016-07-08: 11:00:00 via INTRAVENOUS

## 2016-07-08 NOTE — Anesthesia Postprocedure Evaluation (Signed)
Anesthesia Post Note  Patient: [name]  Procedure(s) Performed: Procedure(s) (LRB): CARDIOVERSION (N/A)  Patient location during evaluation: PACU Anesthesia Type: General Level of consciousness: awake and alert Pain management: pain level controlled Vital Signs Assessment: post-procedure vital signs reviewed and stable Respiratory status: spontaneous breathing, nonlabored ventilation, respiratory function stable and patient connected to nasal cannula oxygen Cardiovascular status: blood pressure returned to baseline and stable Postop Assessment: no signs of nausea or vomiting Anesthetic complications: no    Last Vitals:  Vitals:   07/08/16 1250 07/08/16 1300  BP: (!) 180/80 (!) 188/66  Pulse: (!) 59 (!) 58  Resp: (!) 21 (!) 21  Temp:      Last Pain:  Vitals:   07/08/16 1052  TempSrc: Oral                 Tiajuana Amass

## 2016-07-08 NOTE — Discharge Instructions (Signed)
Monitored Anesthesia Care °Monitored anesthesia care is an anesthesia service for a medical procedure. Anesthesia is the loss of the ability to feel pain. It is produced by medicines called anesthetics. It may affect a small area of your body (local anesthesia), a large area of your body (regional anesthesia), or your entire body (general anesthesia). The need for monitored anesthesia care depends your procedure, your condition, and the potential need for regional or general anesthesia. It is often provided during procedures where:  °· General anesthesia may be needed if there are complications. This is because you need special care when you are under general anesthesia.   °· You will be under local or regional anesthesia. This is so that you are able to have higher levels of anesthesia if needed.   °· You will receive calming medicines (sedatives). This is especially the case if sedatives are given to put you in a semi-conscious state of relaxation (deep sedation). This is because the amount of sedative needed to produce this state can be hard to predict. Too much of a sedative can produce general anesthesia. °Monitored anesthesia care is performed by one or more health care providers who have special training in all types of anesthesia. You will need to meet with these health care providers before your procedure. During this meeting, they will ask you about your medical history. They will also give you instructions to follow. (For example, you will need to stop eating and drinking before your procedure. You may also need to stop or change medicines you are taking.) During your procedure, your health care providers will stay with you. They will:  °· Watch your condition. This includes watching your blood pressure, breathing, and level of pain.   °· Diagnose and treat problems that occur.   °· Give medicines if they are needed. These may include calming medicines (sedatives) and anesthetics.   °· Make sure you are  comfortable.   °Having monitored anesthesia care does not necessarily mean that you will be under anesthesia. It does mean that your health care providers will be able to manage anesthesia if you need it or if it occurs. It also means that you will be able to have a different type of anesthesia than you are having if you need it. When your procedure is complete, your health care providers will continue to watch your condition. They will make sure any medicines wear off before you are allowed to go home.  °  °This information is not intended to replace advice given to you by your health care provider. Make sure you discuss any questions you have with your health care provider. °  °Document Released: 08/25/2005 Document Revised: 12/20/2014 Document Reviewed: 01/10/2013 °Elsevier Interactive Patient Education ©2016 Else[employer] °Electrical Cardioversion, Care After °Refer to this sheet in the next few weeks. These instructions provide you with information on caring for yourself after your procedure. Your health care provider may also give you more specific instructions. Your treatment has been planned according to current medical practices, but problems sometimes occur. Call your health care provider if you have any problems or questions after your procedure. °WHAT TO EXPECT AFTER THE PROCEDURE °After your procedure, it is typical to have the following sensations: °· Some redness on the skin where the shocks were delivered. If this is tender, a sunburn lotion or hydrocortisone cream may help. °· Possible return of an abnormal heart rhythm within hours or days after the procedure. °HOME CARE INSTRUCTIONS °· Take medicines only as directed by your health care provider.   Be sure you understand how and when to take your medicine. °· Learn how to feel your pulse and check it often. °· Limit your activity for 48 hours after the procedure or as directed by your health care provider. °· Avoid or minimize caffeine and other  stimulants as directed by your health care provider. °SEEK MEDICAL CARE IF: °· You feel like your heart is beating too fast or your pulse is not regular. °· You have any questions about your medicines. °· You have bleeding that will not stop. °SEEK IMMEDIATE MEDICAL CARE IF: °· You are dizzy or feel faint. °· It is hard to breathe or you feel short of breath. °· There is a change in discomfort in your chest. °· Your speech is slurred or you have trouble moving an arm or leg on one side of your body. °· You get a serious muscle cramp that does not go away. °· Your fingers or toes turn cold or blue. °  °This information is not intended to replace advice given to you by your health care provider. Make sure you discuss any questions you have with your health care provider. °  °Document Released: 09/19/2013 Document Revised: 12/20/2014 Document Reviewed: 09/19/2013 °Elsevier Interactive Patient Education ©2016 Else[employer] ° °

## 2016-07-08 NOTE — Procedures (Signed)
Electrical Cardioversion Procedure Note [name] [id number] [birth year]  Procedure: Electrical Cardioversion Indications:  Atrial Fibrillation, she has been Xarelto > 30 days without missing a dose.   Procedure Details Consent: Risks of procedure as well as the alternatives and risks of each were explained to the (patient/caregiver).  Consent for procedure obtained. Time Out: Verified patient identification, verified procedure, site/side was marked, verified correct patient position, special equipment/implants available, medications/allergies/relevent history reviewed, required imaging and test results available.  Performed  Patient placed on cardiac monitor, pulse oximetry, supplemental oxygen as necessary.  Sedation given: Propofol per anesthesiology Pacer pads placed anterior and posterior chest.  Cardioverted 2 time(s), 2nd time with sternal pressure.  Cardioverted at [place].  Evaluation Findings: Post procedure EKG shows: NSR Complications: None Patient did tolerate procedure well.   [name] 07/08/2016, 12:29 PM

## 2016-07-08 NOTE — Transfer of Care (Signed)
Immediate Anesthesia Transfer of Care Note  Patient: [name]  Procedure(s) Performed: Procedure(s): CARDIOVERSION (N/A)  Patient Location: PACU and Endoscopy Unit  Anesthesia Type:MAC  Level of Consciousness: awake, alert  and oriented  Airway & Oxygen Therapy: Patient Spontanous Breathing and Patient connected to nasal cannula oxygen  Post-op Assessment: Report given to RN and Post -op Vital signs reviewed and stable  Post vital signs: Reviewed and stable  Last Vitals:  Vitals:   07/08/16 1052  BP: (!) 201/117  Pulse: (!) 101  Resp: (!) 22  Temp: 36.8 C    Last Pain:  Vitals:   07/08/16 1052  TempSrc: Oral         Complications: No apparent anesthesia complications

## 2016-07-08 NOTE — H&P (View-Only) (Signed)
Cardiology Office Note   Date:  06/17/2016   ID:  ESHANTI MOSCONE, DOB [birth year], MRN [id number]  PCP:  Peter Martinique, MD  Cardiologist:  Dr Martinique  [name], PA-C   Chief Complaint  Patient presents with  . Follow-up  . not sleeping well  . Shortness of Breath    History of Present Illness: [name] is a 82 y.o. female with a history of HTN, vertigo, TIA, and RA. Hx nl EF and grade 2 dd by echo. PAF dx 04/2016, on Xarelto and Cardizem, CHADS2VASC=5 (HTN, CHF, female, age x 2)  Seen 06/01 and was in afib, BB increased 50>75mg . Pt to stay on anticoag x 30 days and consider DCCV.   [name] presents for follow-up of her atrial fibrillation.  She continues to feel weak and washed out. She ambulates very little because her legs feel so weak. She also has dyspnea on exertion. She has some lower extremity edema, but is not being weight consistently. It is hard to tell exactly what her volume status is from her weights. [facility] a [facility] and has to push her self to walk to the dining room and back for meals. She otherwise uses a wheelchair for ambulation. She continues to feel poorly and wonders what we can do. She is tolerating the Eliquis well and has not missed any doses.  Because she is now in assisted living, they need some help with some of her medications. Her doctor wants to put her on Remeron and her daughter wants to make sure that is okay from a cardiac standpoint. Additionally, she needs an order for daily weights which are not being done. Her daughter was to make sure heart healthy diet as appropriate. She needs an order for Tylenol because of the difference between being in an assisted living facility versus independent living. [name] also feels like she needs oxygen. However, she has not had any documented hypoxia, so the facility is concerned about the oxygen. They are requesting that she get an order for the oxygen.    Past  Medical History  Diagnosis Date  . Hypertension   . Vertigo   . RA (rheumatoid arthritis) ([place])   . RA (rheumatoid arthritis) ([place])   . New onset atrial fibrillation ([place]) 04/2016    a. started on Xarelto and Cardizem  . CHF (congestive heart failure) ([place])   . SOB (shortness of breath) 04/2016    Hospitalist    Past Surgical History  Procedure Laterality Date  . Replacement total knee Right 1996    [place] MD  . Shoulder surgery Bilateral 2005,2008,2009    Baird Lyons MD  . Cervical spine surgery  2005    Dr. Vertell Limber    Current Outpatient Prescriptions  Medication Sig Dispense Refill  . calcium carbonate (OS-CAL) 600 MG TABS Take 800 mg by mouth 2 (two) times daily with a meal.     . cholecalciferol (VITAMIN D) 1000 UNITS tablet Take 2,500 Units by mouth daily.     Marland Kitchen diltiazem (CARDIZEM CD) 120 MG 24 hr capsule Take 1 capsule (120 mg total) by mouth daily. 90 capsule 3  . docusate sodium (COLACE) 100 MG capsule Take 200 mg by mouth every evening.    . enalapril (VASOTEC) 20 MG tablet Take 20 mg by mouth 2 (two) times daily.      . furosemide (LASIX) 20 MG tablet Take 1 tablet (20 mg total) by mouth daily as  needed (for weight gain 3 lbs in one day or 5 lbs in one week). 90 tablet 3  . loratadine (CLARITIN) 10 MG tablet Take 10 mg by mouth daily.    . Magnesium 250 MG TABS Take 250 mg by mouth daily.      . metoprolol succinate (TOPROL-XL) 50 MG 24 hr tablet Take 1.5 tablets (75 ng total) by mouth every morning and 1 tablet (50 mg total) by mouth every evening. 225 tablet 3  . potassium chloride SA (KLOR-CON M20) 20 MEQ tablet Take 1 tablet (20 mEq total) by mouth daily as needed (for weight gain 3 lbs in one day or 5 lbs in one week). 90 tablet 3  . predniSONE (DELTASONE) 1 MG tablet Take 4 mg by mouth daily with breakfast.    . Rivaroxaban (XARELTO) 15 MG TABS tablet Take 1 tablet (15 mg total) by mouth daily with supper. 90 tablet 3  . vitamin C (ASCORBIC ACID) 500 MG  tablet Take 500 mg by mouth daily.      . Wheat Dextrin (BENEFIBER PO) Take 4 scoop by mouth daily.      No current facility-administered medications for this visit.    Allergies:   Other    Social History:  The patient  reports that she has quit smoking. She does not have any smokeless tobacco history on file. She reports that she does not drink alcohol or use illicit drugs.   Family History:  The patient's family history includes Arthritis in her daughter and son; Hypertension in her son; Hypothyroidism in her daughter.    ROS:  Please see the history of present illness. All other systems are reviewed and negative.    PHYSICAL EXAM: VS:  BP 166/96 mmHg  Pulse 82  Ht 5\' 1"  (1.549 m)  Wt 152 lb 6.4 oz (69.128 kg)  BMI 28.81 kg/m2 , BMI Body mass index is 28.81 kg/(m^2). GEN: Well nourished, well developed, female in no acute distress HEENT: normal for age  Neck: JVD at 8 cm, no carotid bruit, no masses Cardiac: Irregular rate and rhythm; soft murmur, no rubs, or gallops Respiratory: Decreased breath sounds bases bilaterally, normal work of breathing GI: soft, nontender, nondistended, + BS MS: no deformity or atrophy; trace edema; distal pulses are 2+ in all 4 extremities  Skin: warm and dry, no rash Neuro:  Strength and sensation are intact Psych: euthymic mood, full affect   EKG:  EKG is ordered today. The ekg ordered today demonstrates atrial fibrillation, ventricular rate 95 bpm, low voltage QRS in the inferior leads, different from 05/13/2016   Recent Labs: 05/03/2016: ALT 23 05/04/2016: TSH 1.522 05/05/2016: Magnesium 2.0 05/10/2016: B Natriuretic Peptide 286.9*; BUN 8; Creatinine, Ser 0.86; Hemoglobin 12.8; Platelets 261; Potassium 3.8; Sodium 136    Lipid Panel    Component Value Date/Time   CHOL 160 05/04/2016 0303   TRIG 64 05/04/2016 0303   HDL 66 05/04/2016 0303   CHOLHDL 2.4 05/04/2016 0303   VLDL 13 05/04/2016 0303   LDLCALC 81 05/04/2016 0303     Wt  Readings from Last 3 Encounters:  06/17/16 152 lb 6.4 oz (69.128 kg)  05/13/16 151 lb 6.4 oz (68.675 kg)  05/07/16 153 lb 11.2 oz (69.718 kg)     Other studies Reviewed: Additional studies/ records that were reviewed today include: Office notes and other testing.  ASSESSMENT AND PLAN:  1.  Persistent atrial fibrillation: I believe that some of her symptoms are coming from the persistent atrial  fibrillation. She has been consistently anticoagulated for over 30 days.   Therefore, we can go ahead and schedule her cardioversion without a TEE. We will increase her metoprolol for better rate control.  On echocardiogram 05/04/2016, her left atrium was 39 mm and her right atrium was mildly dilated.   Because of her age and general frailty, I will not use an antiarrhythmic at this time, but will start one at Dr. Doug Sou discretion.  2. Chronic anticoagulation: her CHADS2VASC=7, she is compliant with her Xarelto. No bleeding issues.  3. Chronic diastolic CHF: Her weight is minimally changed but she has some mild volume overload by exam. He is taking Lasix 20 mg daily when necessary for weight gain or edema. I will give her one dose of Lasix 40 mg. Continue other Lasix dosing as needed.   Current medicines are reviewed at length with the patient today.  The patient does not have concerns regarding medicines.  The following changes have been made:  Increase metoprolol 200 mg a.m., 50 mg p.m.  Labs/ tests ordered today include:    Orders Placed This Encounter  Procedures  . ELECTRICAL CARDIOVERSION  . APTT  . Basic metabolic panel  . CBC  . Protime-INR  . TSH  . EKG 12-Lead     Disposition:   FU with Dr. Martinique   Signed, Rosaria Ferries, PA-C  06/17/2016 5:45 PM    [place] Phone: [phone]; Fax: [phone]  This note was written with the assistance of speech recognition software. Please excuse any transcriptional errors.

## 2016-07-08 NOTE — Interval H&P Note (Signed)
History and Physical Interval Note:  07/08/2016 12:22 PM  [name]  has presented today for surgery, with the diagnosis of A FIB   The various methods of treatment have been discussed with the patient and family. After consideration of risks, benefits and other options for treatment, the patient has consented to  Procedure(s): CARDIOVERSION (N/A) as a surgical intervention .  The patient's history has been reviewed, patient examined, no change in status, stable for surgery.  I have reviewed the patient's chart and labs.  Questions were answered to the patient's satisfaction.     Kennieth Plotts [employer]

## 2016-07-08 NOTE — Anesthesia Preprocedure Evaluation (Signed)
Anesthesia Evaluation  Patient identified by MRN, date of birth, ID band Patient awake    Reviewed: Allergy & Precautions, NPO status , Patient's Chart, lab work & pertinent test results  Airway Mallampati: II  TM Distance: >3 FB Neck ROM: Full    Dental  (+) Dental Advisory Given   Pulmonary shortness of breath, former smoker,    breath sounds clear to auscultation       Cardiovascular hypertension, +CHF  + dysrhythmias  Rhythm:Regular Rate:Normal     Neuro/Psych negative neurological ROS     GI/Hepatic negative GI ROS, Neg liver ROS,   Endo/Other  negative endocrine ROS  Renal/GU negative Renal ROS     Musculoskeletal  (+) Arthritis ,   Abdominal   Peds  Hematology negative hematology ROS (+)   Anesthesia Other Findings   Reproductive/Obstetrics                            Anesthesia Physical Anesthesia Plan  ASA: II  Anesthesia Plan: General   Post-op Pain Management:    Induction: Intravenous  Airway Management Planned: Mask  Additional Equipment:   Intra-op Plan:   Post-operative Plan:   Informed Consent: I have reviewed the patients History and Physical, chart, labs and discussed the procedure including the risks, benefits and alternatives for the proposed anesthesia with the patient or authorized representative who has indicated his/her understanding and acceptance.     Plan Discussed with: CRNA  Anesthesia Plan Comments:         Anesthesia Quick Evaluation

## 2016-07-08 NOTE — Anesthesia Procedure Notes (Signed)
Procedure Name: MAC Performed by: Eligha Bridegroom Pre-anesthesia Checklist: Patient identified, Emergency Drugs available, Suction available, Patient being monitored and Timeout performed Patient Re-evaluated:Patient Re-evaluated prior to inductionOxygen Delivery Method: Ambu bag Preoxygenation: Pre-oxygenation with 100% oxygen Intubation Type: IV induction

## 2016-07-09 ENCOUNTER — Encounter (HOSPITAL_COMMUNITY): Payer: Self-pay | Admitting: Cardiology

## 2016-07-29 ENCOUNTER — Ambulatory Visit (INDEPENDENT_AMBULATORY_CARE_PROVIDER_SITE_OTHER): Payer: PPO | Admitting: Cardiology

## 2016-07-29 ENCOUNTER — Encounter: Payer: Self-pay | Admitting: Cardiology

## 2016-07-29 VITALS — BP 170/84 | HR 57 | Ht 63.0 in | Wt 148.0 lb

## 2016-07-29 DIAGNOSIS — I10 Essential (primary) hypertension: Secondary | ICD-10-CM | POA: Diagnosis not present

## 2016-07-29 DIAGNOSIS — I4891 Unspecified atrial fibrillation: Secondary | ICD-10-CM | POA: Diagnosis not present

## 2016-07-29 DIAGNOSIS — Z7901 Long term (current) use of anticoagulants: Secondary | ICD-10-CM | POA: Diagnosis not present

## 2016-07-29 NOTE — Patient Instructions (Signed)
Your physician recommends that you schedule a follow-up appointment in: 3 months with Dr. Martinique.  If you need a refill on your cardiac medications before your next appointment, please call your pharmacy.

## 2016-07-29 NOTE — Assessment & Plan Note (Signed)
AF noted May 2017. Pt c/o fatigue. S/P DCCV 07/08/16 to NSR.

## 2016-07-29 NOTE — Progress Notes (Signed)
07/29/2016 [name]   08/24/1928  [id number]  Primary Physician Katherine Martinique, MD Primary Cardiologist: Dr [name]  HPI:  Pleasant 82 y/o female who first saw for AF in May 2017. Her daughter, (a former Therapist, sports on the renal unit), says her mother was doing fine at [facility] independent Living until may 2017. She started to complain of dyspnea which was fairly rapid in onset. She presented to the ED and was noted to be in AF and CHF.  She was placed on Xarelto and diuresed. Her beta blocker was adjusted. She underwent OP DCCV 07/08/16 and is the office today for follow up. Overall she feels no different. Her daughter says she has noticed a steady decline since May though the pt says I feel "fine". She now lives in the Assisted Living section of [facility]. She denies any orthopnea or dyspnea. She does have mild LE edema. They are following her wgts at [facility] and she has not required any Lasix. The pt denies any syncope or near syncope.    Current Outpatient Prescriptions  Medication Sig Dispense Refill  . cholecalciferol (VITAMIN D) 1000 UNITS tablet Take 2,500 Units by mouth daily.     Marland Kitchen diltiazem (CARDIZEM CD) 120 MG 24 hr capsule Take 1 capsule (120 mg total) by mouth daily. 90 capsule 3  . docusate sodium (COLACE) 100 MG capsule Take 200 mg by mouth every evening.    . enalapril (VASOTEC) 20 MG tablet Take 20 mg by mouth 2 (two) times daily.      . furosemide (LASIX) 20 MG tablet Take 1 tablet (20 mg total) by mouth daily as needed (for weight gain 3 lbs in one day or 5 lbs in one week). 90 tablet 3  . loratadine (CLARITIN) 10 MG tablet Take 10 mg by mouth daily.    . Magnesium 250 MG TABS Take 250 mg by mouth daily.      . metoprolol succinate (TOPROL-XL) 100 MG 24 hr tablet Take 1 tablet by mouth every morning.    . metoprolol succinate (TOPROL-XL) 50 MG 24 hr tablet Take 1 tablet by mouth every evening.    . Omega-3 Fatty Acids (OMEGA 3 PO) Take 1 tablet by mouth daily.      . potassium chloride SA (KLOR-CON M20) 20 MEQ tablet Take 1 tablet (20 mEq total) by mouth daily as needed (for weight gain 3 lbs in one day or 5 lbs in one week). 90 tablet 3  . predniSONE (DELTASONE) 1 MG tablet Take 4 mg by mouth daily with breakfast.    . Rivaroxaban (XARELTO) 15 MG TABS tablet Take 1 tablet (15 mg total) by mouth daily with supper. 90 tablet 3  . vitamin C (ASCORBIC ACID) 500 MG tablet Take 500 mg by mouth daily.      . Wheat Dextrin (BENEFIBER PO) Take 4 scoop by mouth daily.      No current facility-administered medications for this visit.     Allergies  Allergen Reactions  . Other Nausea And Vomiting    Pt states she is very sensitive to pain medications.    Social History   Social History  . Marital status: Widowed    Spouse name: N/A  . Number of children: N/A  . Years of education: N/A   Occupational History  . Retired    Social History Main Topics  . Smoking status: Former Research scientist (life sciences)  . Smokeless tobacco: Never Used  . Alcohol use No  .  Drug use: No  . Sexual activity: Not on file   Other Topics Concern  . Not on file   Social History Narrative   Living in [facility]     Review of Systems: General: negative for chills, fever, night sweats or weight changes.  Cardiovascular: negative for chest pain, dyspnea on exertion, edema, orthopnea, palpitations, paroxysmal nocturnal dyspnea or shortness of breath Dermatological: negative for rash Respiratory: negative for cough or wheezing Urologic: negative for hematuria Abdominal: negative for nausea, vomiting, diarrhea, bright red blood per rectum, melena, or hematemesis Neurologic: negative for visual changes, syncope, or dizziness All other systems reviewed and are otherwise negative except as noted above.    Blood pressure (!) 170/84, pulse (!) 57, height 5\' 3"  (1.6 m), weight 148 lb (67.1 kg).  General appearance: alert, cooperative, appears stated age, no distress and in wheel  chair Neck: no carotid bruit and no JVD Lungs: clear to auscultation bilaterally Heart: regular rate and rhythm Extremities: 1+E edema Skin: Skin color, texture, turgor normal. No rashes or lesions Neurologic: Grossly normal  EKG NSR, SB 58 (I saw this EKG but it may have been sent home with the pt by accident)  ASSESSMENT AND PLAN:   Atrial fibrillation, new onset ([place]) AF noted May 2017. Pt c/o fatigue. S/P DCCV 07/08/16 to NSR.  Essential hypertension Nl LVF with grade 2 DD, normal LA  Chronic anticoagulation Xarelto- CHADs VASc= 7 (sex, age, HTN, TIA, CHF May 2017)   PLAN  I did not change [name]'s medication. She did not appear to be in CHF when I examined her. Her HR is a little slow but she apparently has been on Toporol for some time. F/U with Dr Lamb in 3 months.  Kerin Ransom PA-C 07/29/2016 3:46 PM

## 2016-07-29 NOTE — Assessment & Plan Note (Signed)
Xarelto- CHADs VASc= 7 (sex, age, HTN, TIA, CHF May 2017)

## 2016-07-29 NOTE — Assessment & Plan Note (Addendum)
Nl LVF with grade 2 DD, normal LA

## 2016-08-12 ENCOUNTER — Non-Acute Institutional Stay: Payer: PPO | Admitting: Internal Medicine

## 2016-08-12 ENCOUNTER — Emergency Department (HOSPITAL_COMMUNITY)
Admission: EM | Admit: 2016-08-12 | Discharge: 2016-08-13 | Disposition: A | Discharge status: Home / Self Care | Payer: PPO | Attending: Emergency Medicine | Admitting: Emergency Medicine

## 2016-08-12 ENCOUNTER — Encounter: Payer: Self-pay | Admitting: Internal Medicine

## 2016-08-12 ENCOUNTER — Encounter (HOSPITAL_COMMUNITY): Payer: Self-pay | Admitting: Emergency Medicine

## 2016-08-12 ENCOUNTER — Emergency Department (HOSPITAL_COMMUNITY): Payer: PPO

## 2016-08-12 VITALS — BP 142/78 | HR 78 | Temp 98.8°F | Ht 63.0 in | Wt 148.0 lb

## 2016-08-12 DIAGNOSIS — H539 Unspecified visual disturbance: Secondary | ICD-10-CM

## 2016-08-12 DIAGNOSIS — Z96651 Presence of right artificial knee joint: Secondary | ICD-10-CM | POA: Diagnosis not present

## 2016-08-12 DIAGNOSIS — Z7901 Long term (current) use of anticoagulants: Secondary | ICD-10-CM

## 2016-08-12 DIAGNOSIS — M1990 Unspecified osteoarthritis, unspecified site: Secondary | ICD-10-CM

## 2016-08-12 DIAGNOSIS — I110 Hypertensive heart disease with heart failure: Secondary | ICD-10-CM | POA: Insufficient documentation

## 2016-08-12 DIAGNOSIS — Z87891 Personal history of nicotine dependence: Secondary | ICD-10-CM | POA: Insufficient documentation

## 2016-08-12 DIAGNOSIS — I481 Persistent atrial fibrillation: Secondary | ICD-10-CM | POA: Diagnosis not present

## 2016-08-12 DIAGNOSIS — M542 Cervicalgia: Secondary | ICD-10-CM

## 2016-08-12 DIAGNOSIS — R609 Edema, unspecified: Secondary | ICD-10-CM | POA: Diagnosis not present

## 2016-08-12 DIAGNOSIS — I10 Essential (primary) hypertension: Secondary | ICD-10-CM

## 2016-08-12 DIAGNOSIS — L858 Other specified epidermal thickening: Secondary | ICD-10-CM

## 2016-08-12 DIAGNOSIS — I5032 Chronic diastolic (congestive) heart failure: Secondary | ICD-10-CM

## 2016-08-12 DIAGNOSIS — M069 Rheumatoid arthritis, unspecified: Secondary | ICD-10-CM

## 2016-08-12 DIAGNOSIS — I4819 Other persistent atrial fibrillation: Secondary | ICD-10-CM

## 2016-08-12 DIAGNOSIS — H538 Other visual disturbances: Secondary | ICD-10-CM | POA: Insufficient documentation

## 2016-08-12 HISTORY — DX: Edema, unspecified: R60.9

## 2016-08-12 HISTORY — DX: Cervicalgia: M54.2

## 2016-08-12 LAB — COMPREHENSIVE METABOLIC PANEL
ALK PHOS: 45 U/L (ref 38–126)
ALT: 16 U/L (ref 14–54)
ANION GAP: 12 (ref 5–15)
AST: 25 U/L (ref 15–41)
Albumin: 3.4 g/dL — ABNORMAL LOW (ref 3.5–5.0)
BILIRUBIN TOTAL: 0.6 mg/dL (ref 0.3–1.2)
BUN: 13 mg/dL (ref 6–20)
CALCIUM: 9.5 mg/dL (ref 8.9–10.3)
CO2: 23 mmol/L (ref 22–32)
CREATININE: 0.86 mg/dL (ref 0.44–1.00)
Chloride: 98 mmol/L — ABNORMAL LOW (ref 101–111)
GFR, EST NON AFRICAN AMERICAN: 59 mL/min — AB (ref >60)
Glucose, Bld: 109 mg/dL — ABNORMAL HIGH (ref 65–99)
Potassium: 3.7 mmol/L (ref 3.5–5.1)
Sodium: 133 mmol/L — ABNORMAL LOW (ref 135–145)
TOTAL PROTEIN: 5.7 g/dL — AB (ref 6.5–8.1)

## 2016-08-12 LAB — CBC WITH DIFFERENTIAL/PLATELET
BASOS PCT: 0 %
Basophils Absolute: 0 10*3/uL (ref 0.0–0.1)
EOS ABS: 0.1 10*3/uL (ref 0.0–0.7)
EOS PCT: 1 %
HEMATOCRIT: 43 % (ref 36.0–46.0)
HEMOGLOBIN: 13.8 g/dL (ref 12.0–15.0)
LYMPHS PCT: 29 %
Lymphs Abs: 2.1 10*3/uL (ref 0.7–4.0)
MCH: 30.4 pg (ref 26.0–34.0)
MCHC: 32.1 g/dL (ref 30.0–36.0)
MCV: 94.7 fL (ref 78.0–100.0)
Monocytes Absolute: 0.8 10*3/uL (ref 0.1–1.0)
Monocytes Relative: 11 %
NEUTROS ABS: 4.3 10*3/uL (ref 1.7–7.7)
NEUTROS PCT: 59 %
Platelets: 214 10*3/uL (ref 150–400)
RBC: 4.54 MIL/uL (ref 3.87–5.11)
RDW: 13.5 % (ref 11.5–15.5)
WBC: 7.3 10*3/uL (ref 4.0–10.5)

## 2016-08-12 LAB — URINE MICROSCOPIC-ADD ON

## 2016-08-12 LAB — URINALYSIS, ROUTINE W REFLEX MICROSCOPIC
Bilirubin Urine: NEGATIVE
Glucose, UA: NEGATIVE mg/dL
Hgb urine dipstick: NEGATIVE
Ketones, ur: NEGATIVE mg/dL
Nitrite: NEGATIVE
Protein, ur: NEGATIVE mg/dL
Specific Gravity, Urine: 1.02 (ref 1.005–1.030)
pH: 5.5 (ref 5.0–8.0)

## 2016-08-12 NOTE — ED Provider Notes (Signed)
[facility] DEPT Provider Note   CSN: [id number] Arrival date & time: 08/12/16  [id number]   History   Chief Complaint Chief Complaint  Patient presents with  . Eye Problem    HPI [name] is a 82 y.o. female.  The history is provided by the patient, medical records and a relative.  Eye Problem   This is a new problem. The current episode started 12 to 24 hours ago. The problem occurs constantly. The problem has not changed since onset.There is a problem in both eyes. There was no injury mechanism. The pain is at a severity of 0/10. The patient is experiencing no pain. There is no history of trauma to the eye. She does not wear contacts. Associated symptoms include decreased vision (patient reports seeing spots in both eyes). Pertinent negatives include no discharge, no photophobia, no eye redness, no nausea, no vomiting and no weakness. She has tried nothing for the symptoms.    Past Medical History:  Diagnosis Date  . Cervicalgia 08/12/2016  . CHF (congestive heart failure) ([place])   . Chronic diastolic CHF (congestive heart failure), NYHA class 3 ([place]) 06/17/2016  . Complication of anesthesia   . Edema 08/12/2016  . Hypertension   . New onset atrial fibrillation ([place]) 04/2016   a. started on Xarelto and Cardizem  . PONV (postoperative nausea and vomiting)   . RA (rheumatoid arthritis) ([place])   . RA (rheumatoid arthritis) ([place])   . SOB (shortness of breath) 04/2016   Hospitalist  . Vertigo     Patient Active Problem List   Diagnosis Date Noted  . Keratoacanthoma 08/12/2016  . Osteoarthritis 08/12/2016  . Edema 08/12/2016  . Cervicalgia 08/12/2016  . Chronic anticoagulation 07/29/2016  . Chronic diastolic CHF (congestive heart failure), NYHA class 3 ([place]) 06/17/2016  . Atrial fibrillation ([place]) 05/03/2016  . Abnormal chest CT 05/03/2016  . Acute respiratory failure with hypoxia ([place]) 05/03/2016  . Syncope 11/03/2014  . Essential hypertension 11/03/2014  . Rheumatoid  arthritis ([place]) 11/03/2014    Past Surgical History:  Procedure Laterality Date  . CARDIOVERSION N/A 07/08/2016   Procedure: CARDIOVERSION;  Surgeon: Larey Dresser, MD;  Location: [facility];  Service: Cardiovascular;  Laterality: N/A;  . CERVICAL SPINE SURGERY  2005   Dr. Vertell Limber  . HYSTEROSCOPY  2001  . JOINT REPLACEMENT    . [place] Bilateral 2005,2008,2009   Baird Lyons MD    OB History    No data available       Home Medications    Prior to Admission medications   Medication Sig Start Date End Date Taking? Authorizing Provider  acetaminophen (TYLENOL) 325 MG tablet Take 650 mg by mouth every 6 (six) hours as needed for mild pain.   Yes Historical Provider, MD  amoxicillin (AMOXIL) 500 MG capsule Take 2,000 mg by mouth See admin instructions. 1 hour prior to dental procedures   Yes Historical Provider, MD  Calcium Carbonate (CALCIUM 600 PO) Take 600-900 mg by mouth 2 (two) times daily. Take one tablet 600 mg in morning, take 1 1/2 tablets in evening for calcium    Yes Historical Provider, MD  diltiazem (CARDIZEM CD) 120 MG 24 hr capsule Take 1 capsule (120 mg total) by mouth daily. 05/13/16  Yes Rhonda G Barrett, PA-C  docusate sodium (COLACE) 100 MG capsule Take 200 mg by mouth every evening.   Yes Historical Provider, MD  enalapril (VASOTEC) 20 MG  tablet Take 20 mg by mouth 2 (two) times daily.     Yes Historical Provider, MD  furosemide (LASIX) 20 MG tablet Take 1 tablet (20 mg total) by mouth daily as needed (for weight gain 3 lbs in one day or 5 lbs in one week). 05/13/16  Yes Rhonda G Barrett, PA-C  loratadine (CLARITIN) 10 MG tablet Take 10 mg by mouth daily.   Yes Historical Provider, MD  Magnesium 250 MG TABS Take 250 mg by mouth daily.     Yes Historical Provider, MD  metoprolol succinate (TOPROL-XL) 50 MG 24 hr tablet Take 50-100 mg by mouth 2 (two) times daily. Take 100mg  in the morning and 50mg  in  the evening. 07/20/16  Yes Historical Provider, MD  Omega-3 Fatty Acids (OMEGA 3 PO) Take 1 tablet by mouth daily.   Yes Historical Provider, MD  OXYGEN Inhale 2 L into the lungs daily as needed (shortness of breath). Use 2 liter as needed for SOB, 02 sat < 90% on room air    Yes Historical Provider, MD  potassium chloride SA (KLOR-CON M20) 20 MEQ tablet Take 1 tablet (20 mEq total) by mouth daily as needed (for weight gain 3 lbs in one day or 5 lbs in one week). 05/13/16  Yes Rhonda G Barrett, PA-C  predniSONE (DELTASONE) 1 MG tablet Take 4 mg by mouth daily with breakfast.   Yes Historical Provider, MD  Rivaroxaban (XARELTO) 15 MG TABS tablet Take 1 tablet (15 mg total) by mouth daily with supper. 05/13/16  Yes Rhonda G Barrett, PA-C  vitamin C (ASCORBIC ACID) 500 MG tablet Take 500 mg by mouth daily.     Yes Historical Provider, MD  Wheat Dextrin (BENEFIBER PO) Take 4 scoop by mouth daily.    Yes Historical Provider, MD    Family History Family History  Problem Relation Age of Onset  . Cancer Sister     brain  . Arthritis Daughter   . Hypothyroidism Daughter   . Hypertension Son   . Arthritis Son     Social History Social History  Substance Use Topics  . Smoking status: Former Smoker    Quit date: 12/13/1966  . Smokeless tobacco: Never Used  . Alcohol use No     Allergies   Morphine sulfate and Other   Review of Systems Review of Systems  Constitutional: Negative for fever.  HENT: Negative for congestion and rhinorrhea.   Eyes: Positive for visual disturbance. Negative for photophobia, pain, discharge and redness.  Respiratory: Negative for shortness of breath.   Cardiovascular: Negative for chest pain and palpitations.  Gastrointestinal: Negative for nausea and vomiting.  Genitourinary: Negative for dysuria and frequency.  Musculoskeletal: Positive for neck pain (has had a few weeks of right sided, posterior neck pain that she was told was musculoskeletal in nature).  Skin:  Negative for rash.  Neurological: Positive for headaches (had a very mild headache when awakening this AM, that resolved wtih tylenol, not present currently). Negative for seizures, syncope, facial asymmetry, weakness and light-headedness.  Hematological: Bruises/bleeds easily (on xarelto).  Psychiatric/Behavioral: Negative for confusion.  All other systems reviewed and are negative.   Physical Exam Updated Vital Signs BP 157/88   Pulse 87   Temp 97.7 F (36.5 C)   Resp 18   Ht 5\' 3"  (1.6 m)   Wt 65.8 kg   SpO2 97%   BMI 25.69 kg/m   Physical Exam  Constitutional: She appears well-developed and well-nourished. No distress.  HENT:  Head: Normocephalic and atraumatic.  Eyes: Conjunctivae and EOM are normal. Pupils are equal, round, and reactive to light. Right eye exhibits no discharge. Left eye exhibits no discharge.  Visual acuity with glasses: 20/50 on left, 20/40 on right. Reports seeing spots in both eyes when looking individually through each one.   Neck: Neck supple.  Mild TTP over right posterior SCM muscle, no meningismus. No bruits   Cardiovascular: Normal rate.  An irregularly irregular rhythm present.  No murmur heard. Pulmonary/Chest: Effort normal and breath sounds normal. No respiratory distress.  Abdominal: Soft. There is no tenderness.  Musculoskeletal: She exhibits no edema.  Neurological: She is alert. She has normal strength. She displays normal reflexes. No cranial nerve deficit or sensory deficit. Coordination normal. GCS eye subscore is 4. GCS verbal subscore is 5. GCS motor subscore is 6.  Skin: Skin is warm and dry.  Psychiatric: She has a normal mood and affect.  Nursing note and vitals reviewed.   ED Treatments / Results  Labs (all labs ordered are listed, but only abnormal results are displayed) Labs Reviewed  COMPREHENSIVE METABOLIC PANEL - Abnormal; Notable for the following:       Result Value   Sodium 133 (*)    Chloride 98 (*)    Glucose,  Bld 109 (*)    Total Protein 5.7 (*)    Albumin 3.4 (*)    GFR calc non Af Amer 59 (*)    All other components within normal limits  URINALYSIS, ROUTINE W REFLEX MICROSCOPIC (NOT AT [facility]) - Abnormal; Notable for the following:    Leukocytes, UA SMALL (*)    All other components within normal limits  URINE MICROSCOPIC-ADD ON - Abnormal; Notable for the following:    Squamous Epithelial / LPF 6-30 (*)    Bacteria, UA FEW (*)    Casts HYALINE CASTS (*)    All other components within normal limits  CBC WITH DIFFERENTIAL/PLATELET    EKG  EKG Interpretation None       Radiology Mr Brain Wo Contrast  Result Date: 08/12/2016 CLINICAL DATA:  Visual disturbances. A right-sided neck pain. Negative neuro exam. EXAM: MRI HEAD WITHOUT CONTRAST TECHNIQUE: Multiplanar, multiecho pulse sequences of the brain and surrounding structures were obtained without intravenous contrast. COMPARISON:  Brain MRI 01/28/2011 FINDINGS: Brain Parenchyma: No acute infarct or intraparenchymal hemorrhage. There is confluent white matter hyperintensity compatible with chronic microvascular disease. No mass lesion or midline shift. The major intracranial flow voids are preserved. The midline structures are normal. Ventricles, Sulci and Extra-axial Spaces: Normal for age. No extra-axial collection. Paranasal Sinuses and Mastoids: No fluid levels or advanced mucosal thickening. Orbits: Normal. Bones and Soft Tissues: The visualized skull base, calvarium and extracranial soft tissues are normal. IMPRESSION: 1. No acute intracranial abnormality. No findings to explain the reported visual symptoms. 2. Findings of chronic microvascular disease. Electronically Signed   By: Ulyses Jarred M.D.   On: 08/12/2016 23:27    Procedures Procedures (including critical care time)  EMERGENCY DEPARTMENT [place] OCULAR EXAM "Study: Limited Ultrasound of Orbit "  INDICATIONS: Floaters/Flashes  Linear probe utilized to obtain images in both  long and short axis of the orbit having the patient look left and right if possible.  PERFORMED BY: Myself, Dr Vanita Panda  IMAGES ARCHIVED?: Yes  LIMITATIONS: none  VIEWS USED: Right orbit and Left orbit  INTERPRETATION: No retinal detachment, Lens in proper position, Normal Optic nerve diameter, no evidence to suggest vitreous hemorrhage     Medications  Ordered in ED Medications - No data to display   Initial Impression / Assessment and Plan / ED Course  I have reviewed the triage vital signs and the nursing notes.  Pertinent labs & imaging results that were available during my care of the patient were reviewed by me and considered in my medical decision making (see chart for details).  Clinical Course   82 year old female with history of atrial fibrillation diagnosed earlier this year on Xarelto, hypertension, CHF, rheumatoid arthritis presenting with bilateral spots in vision, as above, since this morning. AF with stable vitals. Screening basic labs, UA negative.   Painless vision changes. No history of head trauma. Appears binocular. Visual acuity mildly diminished but is preserved. Otherwise, no neurologic deficits. Ultrasound negative bilaterally, and would be unusual to have bilat retinal detachment or vitreous hemorrhage. MRI obtained due to concern for possible CVA in occipital area, but was negative for acute pathology to explain visual symptoms. Feel close f/u with Ophthalmology in clinic is appropriate. Provided number for clinic to call first thing in morning. Patient and daughter agreeable with plan. Discharged in stable condition.   Case discussed with Dr. Vanita Panda who oversaw management of this patient.    Final Clinical Impressions(s) / ED Diagnoses   Final diagnoses:  Vision changes    New Prescriptions New Prescriptions   No medications on file     Ivin Booty, MD 08/13/16 [id number]    Carmin Muskrat, MD 08/14/16 [phone]

## 2016-08-12 NOTE — Progress Notes (Signed)
Patient ID: [name], female   DOB: 21-Mar-1928, 82 y.o.   MRN: [id number]    HISTORY AND PHYSICAL  Location:    [place] Room Number: XL244 Place of Service: Clinic (12)   Extended Emergency Contact Information Primary Emergency Contact: Berneta Levins Address: [address] [place], [place] [address] Johnnette Litter of [place] Phone: [phone] Work Phone: [phone] Mobile Phone: [phone] Relation: Daughter Secondary Emergency Contact: Azalee Course [place] of [place] Phone: [phone] Mobile Phone: [phone] Relation: Son  Advanced Directive information Does patient have an advance directive?: Yes, Type of Advance Directive: Healthcare Power of [place];Living will;Out of facility DNR (pink MOST or yellow form)  Chief Complaint  Patient presents with  . Medical Management of Chronic Issues    New Patient-est with [facility]  . Neck Pain    on right side for few weeks, using heat  . Other    growth on left lower arm, grew from a cut on arm.    HPI:  Patient was admitted to [facility] 05/09/2016. Attending physician has been Jonathon Jordan, but now she is switching to our care.  Past medical history includes diagnoses of hypertension, TIA, peripheral autonomic neuropathy, radiculopathy and cervical region, rheumatoid arthritis, osteoarthritis particularly of the left shoulder, osteoporosis, carpal tunnel syndrome in the left hand. Notes from Dr. Ahmed Prima dating 6/11/7 indicate that she also has complaints of dyspnea on exertion, PND, atrial fibrillation with rapid ventricular response, and weakness secondary to deconditioning. On 06/17/2016 she had a recommendation to have electrical cardioversion. This was done 07/08/16. She tolerated the procedure, but is bak in Af today.  She is DO NOT RESUSCITATE with signed documents. She has a living will.  The BIMS score was 15/15.   Today she complains of  pain on the right side of the neck and has a muscular lump there. She has a history of previous surgery to the neck and 2005. She thinks she had a cervical disc fusion at several levels.  Rheumatoid arthritis is relatively pain-free. She sees Dr. Bo Merino occasionally. She has on regular dosing of prednisone for this problem.  Osteoarthritis is affecting multiple joints including her shoulders and knees. She has had surgery to replace shoulders and one knee.  There is a growth on the dorsal side of the left forearm that has been present for a few weeks. She says agreed from a cut, but is actually keratoacanthoma and quite possibly a skin cancer.  Past Medical History:  Diagnosis Date  . CHF (congestive heart failure) ([place])   . Chronic diastolic CHF (congestive heart failure), NYHA class 3 ([place]) 06/17/2016  . Complication of anesthesia   . Hypertension   . New onset atrial fibrillation ([place]) 04/2016   a. started on Xarelto and Cardizem  . PONV (postoperative nausea and vomiting)   . RA (rheumatoid arthritis) ([place])   . RA (rheumatoid arthritis) ([place])   . SOB (shortness of breath) 04/2016   Hospitalist  . Vertigo     Past Surgical History:  Procedure Laterality Date  . CARDIOVERSION N/A 07/08/2016   Procedure: CARDIOVERSION;  Surgeon: Larey Dresser, MD;  Location: [place];  Service: Cardiovascular;  Laterality: N/A;  . CERVICAL SPINE SURGERY  2005   Dr. Vertell Limber  . HYSTEROSCOPY  2001  . JOINT REPLACEMENT    . REPLACEMENT TOTAL KNEE Right 1996   [place]field MD  . SHOULDER SURGERY Bilateral  [id number]   Baird Lyons MD    Patient Care Team: Estill Dooms, MD as PCP - General (Internal Medicine) Man Otho Darner, NP as Nurse Practitioner (Internal Medicine) Peter M Martinique, MD as Consulting Physician (Cardiology)  Social History   Social History  . Marital status: Widowed    Spouse name: N/A  . Number of children: N/A  . Years of education: N/A   Occupational  History  . Retired    Social History Main Topics  . Smoking status: Former Smoker    Quit date: 12/13/1966  . Smokeless tobacco: Never Used  . Alcohol use No  . Drug use: No  . Sexual activity: No   Other Topics Concern  . Not on file   Social History Narrative   Living in [place] since 05/09/2016   Widowed   Never smoked   Alcohol none   Exercise -none. Sits in wheelchair using her feet to move around   DNR, POA, Living will    reports that she quit smoking about 49 years ago. She has never used smokeless tobacco. She reports that she does not drink alcohol or use drugs.  Family History  Problem Relation Age of Onset  . Cancer Sister     brain  . Arthritis Daughter   . Hypothyroidism Daughter   . Hypertension Son   . Arthritis Son    Family Status  Relation Status  . Mother Deceased  . Father Deceased at age 87  . Sister Deceased at age 41   brain tumor  . Daughter Alive  . Son Alive    Immunization History  Administered Date(s) Administered  . Influenza-Unspecified 09/13/2015  . Pneumococcal-Unspecified 05/14/2015  . Tdap 04/13/2011    Allergies  Allergen Reactions  . Morphine Sulfate     "extremely nauseated"  . Other Nausea And Vomiting    Pt states she is very sensitive to pain medications.    Medications: Patient's Medications  New Prescriptions   No medications on file  Previous Medications   CALCIUM CARBONATE (CALCIUM 600 PO)    Take by mouth. Take one tablet 600 mg in morning, take 1 1/2 tablets in evening for calcium   DILTIAZEM (CARDIZEM CD) 120 MG 24 HR CAPSULE    Take 1 capsule (120 mg total) by mouth daily.   DOCUSATE SODIUM (COLACE) 100 MG CAPSULE    Take 200 mg by mouth every evening.   ENALAPRIL (VASOTEC) 20 MG TABLET    Take 20 mg by mouth 2 (two) times daily.     FUROSEMIDE (LASIX) 20 MG TABLET    Take 1 tablet (20 mg total) by mouth daily as needed (for weight gain 3 lbs in one day or 5 lbs in one week).   LORATADINE  (CLARITIN) 10 MG TABLET    Take 10 mg by mouth daily.   MAGNESIUM 250 MG TABS    Take 250 mg by mouth daily.     METOPROLOL SUCCINATE (TOPROL-XL) 50 MG 24 HR TABLET    Take 1 tablet by mouth every evening.   METOPROLOL SUCCINATE (TOPROL-XL) 50 MG 24 HR TABLET    Take 50 mg by mouth. Take 1 1/2 tablets (75 mg) each morning for blood pressure   OMEGA-3 FATTY ACIDS (OMEGA 3 PO)    Take 1 tablet by mouth daily.   OXYGEN    Inhale into the lungs. Use 2 liter as needed for SOB, 02 sat < 90% on room air  POTASSIUM CHLORIDE SA (KLOR-CON M20) 20 MEQ TABLET    Take 1 tablet (20 mEq total) by mouth daily as needed (for weight gain 3 lbs in one day or 5 lbs in one week).   PREDNISONE (DELTASONE) 1 MG TABLET    Take 4 mg by mouth daily with breakfast.   RIVAROXABAN (XARELTO) 15 MG TABS TABLET    Take 1 tablet (15 mg total) by mouth daily with supper.   VITAMIN C (ASCORBIC ACID) 500 MG TABLET    Take 500 mg by mouth daily.     WHEAT DEXTRIN (BENEFIBER PO)    Take 4 scoop by mouth daily.   Modified Medications   No medications on file  Discontinued Medications   CHOLECALCIFEROL (VITAMIN D) 1000 UNITS TABLET    Take 2,500 Units by mouth daily.    METOPROLOL SUCCINATE (TOPROL-XL) 100 MG 24 HR TABLET    Take 1 tablet by mouth every morning.    Review of Systems  Constitutional: Negative for activity change, appetite change, chills, diaphoresis, fatigue, fever and unexpected weight change.  HENT: Positive for hearing loss. Negative for congestion, ear discharge, ear pain, postnasal drip, rhinorrhea, sore throat, tinnitus, trouble swallowing and voice change.        Xerostomia  Eyes: Negative for pain, redness, itching and visual disturbance.  Respiratory: Positive for shortness of breath. Negative for cough, choking and wheezing.   Cardiovascular: Negative for chest pain, palpitations and leg swelling.       AF. HTN. HX CHF. Cardioversion 07/08/16 by Dr. Aundra Dubin.  Gastrointestinal: Negative for abdominal  distention, abdominal pain, constipation, diarrhea and nausea.       Loss of appetite. Hx hemorrhoids.  Endocrine: Negative for cold intolerance, heat intolerance, polydipsia, polyphagia and polyuria.  Genitourinary: Negative for difficulty urinating, dysuria, flank pain, frequency, hematuria, pelvic pain, urgency and vaginal discharge.  Musculoskeletal: Positive for gait problem (using walker). Negative for arthralgias, back pain, myalgias, neck pain and neck stiffness.       HX RA and OA. OP, senile. Deconditioning and generalized weakness.  Patient 2 left shoulder replacements and 1 right shoulder replacement. Also had a rightt knee replacement. Neck pain in the right posterior neck for 3-4 weeks.  Skin: Negative for color change, pallor and rash.       Forearm for about 2 months. Patient relates to cut, but it appears more treated keratoacanthoma and possible skin cancer.  Allergic/Immunologic: Negative.   Neurological: Positive for weakness (generalized). Negative for dizziness, tremors, seizures, syncope, numbness and headaches.       Paresthesias. Hx syncope.  Hematological: Negative for adenopathy. Bruises/bleeds easily.  Psychiatric/Behavioral: Negative for agitation, behavioral problems, confusion, dysphoric mood, hallucinations, sleep disturbance and suicidal ideas. The patient is not nervous/anxious and is not hyperactive.     Vitals:   08/12/16 1451  BP: (!) 142/78  Pulse: 78  Temp: 98.8 F (37.1 C)  TempSrc: Oral  SpO2: 95%  Weight: 148 lb (67.1 kg)  Height: 5' 3"  (1.6 m)   Body mass index is 26.22 kg/m. Filed Weights   08/12/16 1451  Weight: 148 lb (67.1 kg)     Physical Exam  Constitutional: She is oriented to person, place, and time. She appears well-nourished. No distress.  Thin and frail  HENT:  Right Ear: External ear normal.  Left Ear: External ear normal.  Nose: Nose normal.  Mouth/Throat: Oropharynx is clear and moist. No oropharyngeal exudate.    Eyes: Conjunctivae and EOM are normal. Pupils are equal, round,  and reactive to light. No scleral icterus.  Prescription lenses  Neck: No JVD present. No tracheal deviation present. No thyromegaly present.  Cardiovascular: Exam reveals no gallop and no friction rub.   No murmur heard. Atrial fibrillation with rate of about 110  Pulmonary/Chest: Effort normal. No respiratory distress. She has no wheezes. She has no rales. She exhibits no tenderness.  Abdominal: She exhibits no distension and no mass. There is no tenderness.  Musculoskeletal: Normal range of motion. She exhibits edema. She exhibits no tenderness.  Lymphadenopathy:    She has no cervical adenopathy.  Neurological: She is alert and oriented to person, place, and time. No cranial nerve deficit. Coordination normal.  Skin: No rash noted. She is not diaphoretic. No erythema. No pallor.  Keratoacanthoma of the let t forearm dorsal aspect  Psychiatric: She has a normal mood and affect. Her behavior is normal. Judgment and thought content normal.    Labs reviewed: Lab Summary Latest Ref Rng & Units 05/10/2016 05/07/2016 05/06/2016 05/05/2016 05/04/2016  Hemoglobin 12.0 - 15.0 g/dL 12.8 11.4(L) 11.3(L) 12.5 11.7(L)  Hematocrit 36.0 - 46.0 % 39.2 34.5(L) 35.1(L) 38.5 36.5  White count 4.0 - 10.5 K/uL 6.7 5.6 6.7 6.8 7.2  Platelet count 150 - 400 K/uL 261 207 205 212 215  Sodium 135 - 145 mmol/L 136 134(L) 132(L) 134(L) 136  Potassium 3.5 - 5.1 mmol/L 3.8 3.9 3.8 3.7 3.5  Calcium 8.9 - 10.3 mg/dL 9.8 8.8(L) 8.8(L) 9.1 8.7(L)  Phosphorus - (None) (None) (None) (None) (None)  Creatinine 0.44 - 1.00 mg/dL 0.86 0.71 0.68 0.87 0.72  AST - (None) (None) (None) (None) (None)  Alk Phos - (None) (None) (None) (None) (None)  Bilirubin - (None) (None) (None) (None) (None)  Glucose 65 - 99 mg/dL 110(H) 85 93 100(H) 119(H)  Cholesterol 0 - 200 mg/dL (None) (None) (None) (None) 160  HDL cholesterol >40 mg/dL (None) (None) (None) (None) 66   Triglycerides <150 mg/dL (None) (None) (None) (None) 64  LDL Direct - (None) (None) (None) (None) (None)  LDL Calc 0 - 99 mg/dL (None) (None) (None) (None) 81  Total protein - (None) (None) (None) (None) (None)  Albumin - (None) (None) (None) (None) (None)  Some recent data might be hidden   Lab Results  Component Value Date   BUN 8 05/10/2016   Lab Results  Component Value Date   HGBA1C  01/26/2011    5.3 (NOTE)                                                                       According to the ADA Clinical Practice Recommendations for 2011, when HbA1c is used as a screening test:   >=6.5%   Diagnostic of Diabetes Mellitus           (if abnormal result  is confirmed)  5.7-6.4%   Increased risk of developing Diabetes Mellitus  References:Diagnosis and Classification of Diabetes Mellitus,Diabetes NOBS,9628,36(OQHUT 1):S62-S69 and Standards of Medical Care in         Diabetes - 2011,Diabetes Care,2011,34  (Suppl 1):S11-S61.   Lab Results  Component Value Date   TSH 1.522 05/04/2016    Assessment/Plan  1. Keratoacanthoma - Ambulatory referral to Dermatology- has seen Dr. [place] in the past  2. Persistent atrial fibrillation (HCC) Rate about 110 today.  3. Essential hypertension Controlled  4. Chronic diastolic CHF (congestive heart failure), NYHA class 3 (HCC) Compensated  5. Rheumatoid arthritis, involving unspecified site, unspecified rheumatoid factor presence ([place]) Remains on prednisone. Followed by Dr. Bo Merino.  6. Chronic anticoagulation Taking Eliquis for her atrial fibrillation   7. osteoarthritis type, unspecified site no focally tender joints today  8. Edema, unspecified type Stable   9. Cervicalgia -Aspercreme with lidocaine 4 times daily to painful area  -Advil liquid gel caps twice daily  -Continue massage and warm packs - -Advised to contact us for referral to physical therapy if pains persist

## 2016-08-12 NOTE — ED Triage Notes (Signed)
Pt st's that earlier today she started seeing red spots.  Pt also c/o pain in right side of her neck that she has had for several weeks.  Pt alert and oriented at this time. Neuro exam neg.

## 2016-08-12 NOTE — ED Notes (Signed)
Pt arrived to room

## 2016-08-13 DIAGNOSIS — H5310 Unspecified subjective visual disturbances: Secondary | ICD-10-CM | POA: Diagnosis not present

## 2016-08-13 NOTE — ED Notes (Signed)
Pt provided with d/c instructions at this time.  Pt verbalizes understanding of d/c instructions as well as follow up procedure after d/c.  No new RX at time of d/c.  Pt in no apparent distress at this time.  Pt assisted out of the ED via [place].

## 2016-08-26 DIAGNOSIS — M542 Cervicalgia: Secondary | ICD-10-CM | POA: Diagnosis not present

## 2016-09-01 ENCOUNTER — Non-Acute Institutional Stay: Payer: PPO | Admitting: Nurse Practitioner

## 2016-09-01 ENCOUNTER — Encounter: Payer: Self-pay | Admitting: Nurse Practitioner

## 2016-09-01 DIAGNOSIS — I10 Essential (primary) hypertension: Secondary | ICD-10-CM

## 2016-09-01 DIAGNOSIS — M05711 Rheumatoid arthritis with rheumatoid factor of right shoulder without organ or systems involvement: Secondary | ICD-10-CM | POA: Diagnosis not present

## 2016-09-01 DIAGNOSIS — I5032 Chronic diastolic (congestive) heart failure: Secondary | ICD-10-CM | POA: Diagnosis not present

## 2016-09-01 DIAGNOSIS — I4819 Other persistent atrial fibrillation: Secondary | ICD-10-CM

## 2016-09-01 DIAGNOSIS — M542 Cervicalgia: Secondary | ICD-10-CM | POA: Diagnosis not present

## 2016-09-01 DIAGNOSIS — R609 Edema, unspecified: Secondary | ICD-10-CM

## 2016-09-01 DIAGNOSIS — I481 Persistent atrial fibrillation: Secondary | ICD-10-CM | POA: Diagnosis not present

## 2016-09-01 DIAGNOSIS — Z7901 Long term (current) use of anticoagulants: Secondary | ICD-10-CM | POA: Diagnosis not present

## 2016-09-01 NOTE — Assessment & Plan Note (Signed)
08/14/16 X-ray C spine: anterior interbody fusion cervical spine, severe degenerative disc disease C7-T1, has shoulder prosthesis.

## 2016-09-01 NOTE — Progress Notes (Signed)
Location:  [place] Room Number: [id number] Place of Service:  ALF [phone]) Provider:  Elli Groesbeck, Manxie  NP  Jeanmarie Hubert, MD  Patient Care Team: Estill Dooms, MD as PCP - General (Internal Medicine) Chriselda Leppert Otho Darner, NP as Nurse Practitioner (Internal Medicine) Peter M Martinique, MD as Consulting Physician (Cardiology) Lavonna Monarch, MD as Consulting Physician (Dermatology) Garald Balding, MD as Consulting Physician (Orthopedic Surgery)  Extended Emergency Contact Information Primary Emergency Contact: Berneta Levins Address: [address] [place], [place] [address] Johnnette Litter of [place] Phone: [phone] Work Phone: [phone] Mobile Phone: [phone] Relation: Daughter Secondary Emergency Contact: Azalee Course [place] of [place] Phone: [phone] Mobile Phone: [phone] Relation: Son  Code Status:  DNR Goals of care: Advanced Directive information Advanced Directives 09/01/2016  Does patient have an advance directive? Yes  Type of Advance Directive Living will;Healthcare Power of Attorney  Does patient want to make changes to advanced directive? No - Patient declined  Copy of advanced directive(s) in chart? Yes  Would patient like information on creating an advanced directive? -     Chief Complaint  Patient presents with  . Acute Visit    Left leg weeping, BLL ext. swollen    HPI:  Pt is a 82 y.o. female seen today for an acute visit for BLE leg edema, no weight changes from her baseline #150Ibs. 08/30/16 Furosemide 20mg , Kcl 12meq x1.    Hx Afib, EKG 08/31/16 at [facility] AF[facility], taking Xareltp 15mg  daily, heart rate is in control while on Diltiazem 120mg , Metoprolol 50mg  bid, Enalapril 20mg  bid. HTN blood pressure is controlled. Edema prn Furosemide available to her.  Past Medical History:  Diagnosis Date  . Cervicalgia 08/12/2016  . CHF (congestive heart failure) ([place])   . Chronic diastolic CHF (congestive heart failure),  NYHA class 3 ([place]) 06/17/2016  . Complication of anesthesia   . Edema 08/12/2016  . Hypertension   . New onset atrial fibrillation ([place]) 04/2016   a. started on Xarelto and Cardizem  . PONV (postoperative nausea and vomiting)   . RA (rheumatoid arthritis) ([place])   . RA (rheumatoid arthritis) ([place])   . SOB (shortness of breath) 04/2016   Hospitalist  . Vertigo    Past Surgical History:  Procedure Laterality Date  . CARDIOVERSION N/A 07/08/2016   Procedure: CARDIOVERSION;  Surgeon: Larey Dresser, MD;  Location: [facility];  Service: Cardiovascular;  Laterality: N/A;  . CERVICAL SPINE SURGERY  2005   Dr. Vertell Limber  . HYSTEROSCOPY  2001  . JOINT REPLACEMENT    . REPLACEMENT TOTAL KNEE Right 1996   [facility] Bilateral [id number]   Baird Lyons MD    Allergies  Allergen Reactions  . Morphine Sulfate Other (See Comments)    "extremely nauseated"  . Other Nausea And Vomiting    Pt states she is very sensitive to pain medications.      Medication List       Accurate as of 09/01/16  3:02 PM. Always use your most recent med list.          acetaminophen 325 MG tablet Commonly known as:  TYLENOL Take 650 mg by mouth every 6 (six) hours as needed for mild pain.   amoxicillin 500 MG capsule Commonly known as:  AMOXIL Take 2,000 mg by mouth See admin instructions. 1 hour prior to dental procedures   BENEFIBER PO Take 4 scoop by  mouth daily.   BIOFREEZE 4 % Gel Generic drug:  Menthol (Topical Analgesic) Apply topically to neck four times a day.   CALCIUM 600 PO Take 600-900 mg by mouth 2 (two) times daily. Take one tablet 600 mg in morning, take 1 1/2 tablets in evening for calcium   CLARITIN 10 MG tablet Generic drug:  loratadine Take 10 mg by mouth daily.   diltiazem 120 MG 24 hr capsule Commonly known as:  CARDIZEM CD Take 1 capsule (120 mg total) by mouth daily.   docusate sodium 100 MG capsule Commonly known as:  COLACE Take 200  mg by mouth every evening.   enalapril 20 MG tablet Commonly known as:  VASOTEC Take 20 mg by mouth 2 (two) times daily.   furosemide 20 MG tablet Commonly known as:  LASIX Take 1 tablet (20 mg total) by mouth daily as needed (for weight gain 3 lbs in one day or 5 lbs in one week).   Magnesium 250 MG Tabs Take 250 mg by mouth daily.   metoprolol succinate 50 MG 24 hr tablet Commonly known as:  TOPROL-XL Take 50-100 mg by mouth 2 (two) times daily. Take 100mg  in the morning and 50mg  in the evening.   naproxen sodium 220 MG tablet Commonly known as:  ANAPROX Take 220 mg by mouth 2 (two) times daily with a meal. Breakfast and supper   OMEGA 3 PO Take 1 tablet by mouth daily.   OXYGEN Inhale 2 L into the lungs daily as needed (shortness of breath). Use 2 liter as needed for SOB, 02 sat < 90% on room air   potassium chloride SA 20 MEQ tablet Commonly known as:  KLOR-CON M20 Take 1 tablet (20 mEq total) by mouth daily as needed (for weight gain 3 lbs in one day or 5 lbs in one week).   predniSONE 1 MG tablet Commonly known as:  DELTASONE Take 4 mg by mouth daily with breakfast.   Rivaroxaban 15 MG Tabs tablet Commonly known as:  XARELTO Take 1 tablet (15 mg total) by mouth daily with supper.   vitamin C 500 MG tablet Commonly known as:  ASCORBIC ACID Take 500 mg by mouth daily.       Review of Systems  Constitutional: Negative for activity change, appetite change, chills, diaphoresis, fatigue, fever and unexpected weight change.  HENT: Positive for hearing loss. Negative for congestion, ear discharge, ear pain, postnasal drip, rhinorrhea, sore throat, tinnitus, trouble swallowing and voice change.        Xerostomia  Eyes: Negative for pain, redness, itching and visual disturbance.  Respiratory: Positive for shortness of breath. Negative for cough, choking and wheezing.   Cardiovascular: Negative for chest pain, palpitations and leg swelling.       AF. HTN. HX CHF.  Cardioversion 07/08/16 by Dr. Aundra Dubin.  Gastrointestinal: Negative for abdominal distention, abdominal pain, constipation, diarrhea and nausea.       Loss of appetite. Hx hemorrhoids.  Endocrine: Negative for cold intolerance, heat intolerance, polydipsia, polyphagia and polyuria.  Genitourinary: Negative for difficulty urinating, dysuria, flank pain, frequency, hematuria, pelvic pain, urgency and vaginal discharge.  Musculoskeletal: Positive for gait problem (using walker). Negative for arthralgias, back pain, myalgias, neck pain and neck stiffness.       HX RA and OA. OP, senile. Deconditioning and generalized weakness.  Patient 2 left shoulder replacements and 1 right shoulder replacement. Also had a rightt knee replacement. Neck pain in the right posterior neck for 3-4 weeks.  Skin: Negative for color change, pallor and rash.       Forearm for about 2 months. Patient relates to cut, but it appears more treated keratoacanthoma and possible skin cancer.  Allergic/Immunologic: Negative.   Neurological: Positive for weakness (generalized). Negative for dizziness, tremors, seizures, syncope, numbness and headaches.       Paresthesias. Hx syncope.  Hematological: Negative for adenopathy. Bruises/bleeds easily.  Psychiatric/Behavioral: Negative for agitation, behavioral problems, confusion, dysphoric mood, hallucinations, sleep disturbance and suicidal ideas. The patient is not nervous/anxious and is not hyperactive.     Immunization History  Administered Date(s) Administered  . Influenza-Unspecified 09/13/2015  . Pneumococcal-Unspecified 05/14/2015  . Tdap 04/13/2011   Pertinent  Health Maintenance Due  Topic Date Due  . PNA vac Low Risk Adult (2 of 2 - PCV13) 05/13/2016  . INFLUENZA VACCINE  07/13/2016  . DEXA SCAN  Completed   Fall Risk  08/12/2016  Falls in the past year? No   Functional Status Survey:    Vitals:   09/01/16 1053  BP: 134/70  Pulse: 70  Resp: 18  Temp: 98 F  (36.7 C)  SpO2: 96%  Weight: 151 lb 6.4 oz (68.7 kg)  Height: 5\' 3"  (1.6 m)   Body mass index is 26.82 kg/m. Physical Exam  Constitutional: She is oriented to person, place, and time. She appears well-nourished. No distress.  Thin and frail  HENT:  Right Ear: External ear normal.  Left Ear: External ear normal.  Nose: Nose normal.  Mouth/Throat: Oropharynx is clear and moist. No oropharyngeal exudate.  Eyes: Conjunctivae and EOM are normal. Pupils are equal, round, and reactive to light. No scleral icterus.  Prescription lenses  Neck: No JVD present. No tracheal deviation present. No thyromegaly present.  Cardiovascular: Exam reveals no gallop and no friction rub.   No murmur heard. Atrial fibrillation with rate of about 110  Pulmonary/Chest: Effort normal. No respiratory distress. She has no wheezes. She has no rales. She exhibits no tenderness.  Abdominal: She exhibits no distension and no mass. There is no tenderness.  Musculoskeletal: Normal range of motion. She exhibits edema. She exhibits no tenderness.  Lymphadenopathy:    She has no cervical adenopathy.  Neurological: She is alert and oriented to person, place, and time. No cranial nerve deficit. Coordination normal.  Skin: No rash noted. She is not diaphoretic. No erythema. No pallor.  Keratoacanthoma of the let t forearm dorsal aspect  Psychiatric: She has a normal mood and affect. Her behavior is normal. Judgment and thought content normal.    Labs reviewed:  Recent Labs  05/05/16 0444  05/07/16 0301 05/10/16 1142 08/12/16 1932  NA 134*  < > 134* 136 133*  K 3.7  < > 3.9 3.8 3.7  CL 97*  < > 100* 99* 98*  CO2 28  < > 26 27 23   GLUCOSE 100*  < > 85 110* 109*  BUN 9  < > 6 8 13   CREATININE 0.87  < > 0.71 0.86 0.86  CALCIUM 9.1  < > 8.8* 9.8 9.5  MG 2.0  --   --   --   --   < > = values in this interval not displayed.  Recent Labs  05/03/16 1554 08/12/16 1932  AST 23 25  ALT 23 16  ALKPHOS 51 45    BILITOT 1.0 0.6  PROT 5.9* 5.7*  ALBUMIN 3.6 3.4*    Recent Labs  05/03/16 1554  05/07/16 0301 05/10/16 1142 08/12/16 1932  WBC 8.3  < > 5.6 6.7 7.3  NEUTROABS 7.0  --   --   --  4.3  HGB 12.6  < > 11.4* 12.8 13.8  HCT 39.0  < > 34.5* 39.2 43.0  MCV 93.8  < > 93.5 93.6 94.7  PLT 219  < > 207 261 214  < > = values in this interval not displayed. Lab Results  Component Value Date   TSH 1.522 05/04/2016   Lab Results  Component Value Date   HGBA1C  01/26/2011    5.3 (NOTE)                                                                       According to the ADA Clinical Practice Recommendations for 2011, when HbA1c is used as a screening test:   >=6.5%   Diagnostic of Diabetes Mellitus           (if abnormal result  is confirmed)  5.7-6.4%   Increased risk of developing Diabetes Mellitus  References:Diagnosis and Classification of Diabetes Mellitus,Diabetes [id number] 1):S62-S69 and Standards of Medical Care in         Diabetes - 2011,Diabetes [id number]  (Suppl 1):S11-S61.   Lab Results  Component Value Date   CHOL 160 05/04/2016   HDL 66 05/04/2016   LDLCALC 81 05/04/2016   TRIG 64 05/04/2016   CHOLHDL 2.4 05/04/2016    Significant Diagnostic Results in last 30 days:  Mr Brain Wo Contrast  Result Date: 08/12/2016 CLINICAL DATA:  Visual disturbances. A right-sided neck pain. Negative neuro exam. EXAM: MRI HEAD WITHOUT CONTRAST TECHNIQUE: Multiplanar, multiecho pulse sequences of the brain and surrounding structures were obtained without intravenous contrast. COMPARISON:  Brain MRI 01/28/2011 FINDINGS: Brain Parenchyma: No acute infarct or intraparenchymal hemorrhage. There is confluent white matter hyperintensity compatible with chronic microvascular disease. No mass lesion or midline shift. The major intracranial flow voids are preserved. The midline structures are normal. Ventricles, Sulci and Extra-axial Spaces: Normal for age. No extra-axial collection.  Paranasal Sinuses and Mastoids: No fluid levels or advanced mucosal thickening. Orbits: Normal. Bones and Soft Tissues: The visualized skull base, calvarium and extracranial soft tissues are normal. IMPRESSION: 1. No acute intracranial abnormality. No findings to explain the reported visual symptoms. 2. Findings of chronic microvascular disease. Electronically Signed   By: Ulyses Jarred M.D.   On: 08/12/2016 23:27    Assessment/Plan There are no diagnoses linked to this encounter. Essential hypertension Controlled, continue Diltiazem, Metoprolol, Enalapril, update CMP, BNP  Chronic diastolic CHF (congestive heart failure), NYHA class 3 (HCC) Compensated clinically, noted BLE edema, prn Furosemide available to her, weight #150Ibs at her baseline. Update CMP, BNP  Rheumatoid arthritis (HCC) Multiple sites, stable, continue Prednisone and Aleve, update Hgb a1c and TSH  Chronic anticoagulation Continue Xarelto  Atrial fibrillation (HCC) Heat rate is in control, continue Metoprolol, Diltiazem, Xarelto  Edema BLE, continue daily weight ac breakfast, continue prn Furosemide, update CMP, BNP  Cervicalgia 08/14/16 X-ray C spine: anterior interbody fusion cervical spine, severe degenerative disc disease C7-T1, has shoulder prosthesis.      Family/ staff Communication:continue AL for care assistance.   Labs/tests ordered: CBC, CMP, BNP, TSH, Hgb a1c

## 2016-09-01 NOTE — Assessment & Plan Note (Signed)
Compensated clinically, noted BLE edema, prn Furosemide available to her, weight #150Ibs at her baseline. Update CMP, BNP

## 2016-09-01 NOTE — Assessment & Plan Note (Signed)
BLE, continue daily weight ac breakfast, continue prn Furosemide, update CMP, BNP

## 2016-09-01 NOTE — Assessment & Plan Note (Signed)
Controlled, continue Diltiazem, Metoprolol, Enalapril, update CMP, BNP

## 2016-09-01 NOTE — Assessment & Plan Note (Signed)
Multiple sites, stable, continue Prednisone and Aleve, update Hgb a1c and TSH

## 2016-09-01 NOTE — Assessment & Plan Note (Signed)
Continue Xarelto 

## 2016-09-01 NOTE — Assessment & Plan Note (Signed)
Heat rate is in control, continue Metoprolol, Diltiazem, Xarelto

## 2016-09-02 DIAGNOSIS — R0602 Shortness of breath: Secondary | ICD-10-CM | POA: Diagnosis not present

## 2016-09-02 DIAGNOSIS — R7309 Other abnormal glucose: Secondary | ICD-10-CM | POA: Diagnosis not present

## 2016-09-02 DIAGNOSIS — I10 Essential (primary) hypertension: Secondary | ICD-10-CM | POA: Diagnosis not present

## 2016-09-02 LAB — BASIC METABOLIC PANEL
BUN: 13 mg/dL (ref 4–21)
CREATININE: 0.8 mg/dL (ref <=1.1)
GLUCOSE: 94 mg/dL
POTASSIUM: 3.9 mmol/L (ref 3.4–5.3)
SODIUM: 138 mmol/L (ref 137–147)

## 2016-09-02 LAB — HEPATIC FUNCTION PANEL
ALT: 21 U/L (ref 7–35)
AST: 18 U/L (ref 13–35)
Alkaline Phosphatase: 43 U/L (ref 25–125)
Bilirubin, Total: 0.7 mg/dL

## 2016-09-02 LAB — CBC AND DIFFERENTIAL
HCT: 40 % (ref 36–46)
HEMOGLOBIN: 13.5 g/dL (ref 12.0–16.0)
Platelets: 202 10*3/uL (ref 150–399)
WBC: 6.2 10*3/mL

## 2016-09-02 LAB — TSH: TSH: 1.54 u[IU]/mL (ref <=5.90)

## 2016-09-02 LAB — HEMOGLOBIN A1C: Hemoglobin A1C: 5.7

## 2016-09-08 ENCOUNTER — Other Ambulatory Visit: Payer: Self-pay | Admitting: *Deleted

## 2016-09-15 ENCOUNTER — Non-Acute Institutional Stay: Payer: PPO | Admitting: Nurse Practitioner

## 2016-09-15 ENCOUNTER — Encounter: Payer: Self-pay | Admitting: Nurse Practitioner

## 2016-09-15 ENCOUNTER — Other Ambulatory Visit: Payer: Self-pay | Admitting: Dermatology

## 2016-09-15 DIAGNOSIS — R609 Edema, unspecified: Secondary | ICD-10-CM | POA: Diagnosis not present

## 2016-09-15 DIAGNOSIS — I480 Paroxysmal atrial fibrillation: Secondary | ICD-10-CM

## 2016-09-15 DIAGNOSIS — Z7901 Long term (current) use of anticoagulants: Secondary | ICD-10-CM | POA: Diagnosis not present

## 2016-09-15 DIAGNOSIS — C44629 Squamous cell carcinoma of skin of left upper limb, including shoulder: Secondary | ICD-10-CM | POA: Diagnosis not present

## 2016-09-15 DIAGNOSIS — M05711 Rheumatoid arthritis with rheumatoid factor of right shoulder without organ or systems involvement: Secondary | ICD-10-CM | POA: Diagnosis not present

## 2016-09-15 DIAGNOSIS — M542 Cervicalgia: Secondary | ICD-10-CM

## 2016-09-15 DIAGNOSIS — C4492 Squamous cell carcinoma of skin, unspecified: Secondary | ICD-10-CM

## 2016-09-15 DIAGNOSIS — I10 Essential (primary) hypertension: Secondary | ICD-10-CM

## 2016-09-15 DIAGNOSIS — I5032 Chronic diastolic (congestive) heart failure: Secondary | ICD-10-CM | POA: Diagnosis not present

## 2016-09-15 HISTORY — DX: Squamous cell carcinoma of skin, unspecified: C44.92

## 2016-09-15 NOTE — Assessment & Plan Note (Signed)
Controlled, continue Diltiazem, Metoprolol, Enalapril, update CMP, BNP

## 2016-09-15 NOTE — Assessment & Plan Note (Signed)
Multiple sites, stable, continue Prednisone and Aleve

## 2016-09-15 NOTE — Assessment & Plan Note (Signed)
Heat rate is in control, continue Metoprolol, Diltiazem, Xarelto

## 2016-09-15 NOTE — Assessment & Plan Note (Addendum)
Compensated clinically, noted BLE edema, prn Furosemide available to her, weight #150Ibs at her baseline. Update CMP, BNP 09/03/16 wbc 6.2, Hgb 13.5, plt 202, Na 138, K 3.9, Bun 13, creat 0.76, TSH 1.54, Hgb a1c 5.7, BNP 306.5 Furosemide 20mg , Kcl 69meq daily

## 2016-09-15 NOTE — Progress Notes (Signed)
Location:  [facility] Room Number: [place] of Service:  [phone]) Provider:  Everlena Mackley, [place]  NP  Jeanmarie Hubert, MD  Patient Care Team: Estill Dooms, MD as PCP - General (Internal Medicine) Tenecia Ignasiak Otho Darner, NP as Nurse Practitioner (Internal Medicine) Peter M Martinique, MD as Consulting Physician (Cardiology) Lavonna Monarch, MD as Consulting Physician (Dermatology) Garald Balding, MD as Consulting Physician (Orthopedic Surgery)  Extended Emergency Contact Information Primary Emergency Contact: Berneta Levins Address: [address] [place], [place] [address] Johnnette Litter of [place] Phone: [phone] Work Phone: [phone] Mobile Phone: [phone] Relation: Daughter Secondary Emergency Contact: Azalee Course [place] of [place] Phone: [phone] Mobile Phone: [phone] Relation: Son  Code Status:  DNR Goals of care: Advanced Directive information Advanced Directives 09/15/2016  Does patient have an advance directive? Yes  Type of Paramedic of [place];Living will;Out of facility DNR (pink MOST or yellow form)  Does patient want to make changes to advanced directive? No - Patient declined  Copy of advanced directive(s) in chart? Yes  Would patient like information on creating an advanced directive? -     Chief Complaint  Patient presents with  . Acute Visit    Left leg weeping again from 1 spot    HPI:  Pt is a 82 y.o. female seen today for an acute visit for left leg edema, weeping, no s/s of cellulitis, no weight changes from her baseline #150Ibs. Prn Furosemide 20mg , Kcl 68meq available to her.    Hx Afib, EKG 08/31/16 at [facility] AFib, taking Xareltp 15mg  daily, heart rate is in control while on Diltiazem 120mg , Metoprolol 50mg  bid, Enalapril 20mg  bid. HTN blood pressure is controlled. Edema prn Furosemide available to her.       Past Medical History:  Diagnosis Date  . Cervicalgia  08/12/2016  . CHF (congestive heart failure) ([place])   . Chronic diastolic CHF (congestive heart failure), NYHA class 3 ([place]) 06/17/2016  . Complication of anesthesia   . Edema 08/12/2016  . Hypertension   . New onset atrial fibrillation ([place]) 04/2016   a. started on Xarelto and Cardizem  . PONV (postoperative nausea and vomiting)   . RA (rheumatoid arthritis) ([place])   . RA (rheumatoid arthritis) ([place])   . SOB (shortness of breath) 04/2016   Hospitalist  . Vertigo    Past Surgical History:  Procedure Laterality Date  . CARDIOVERSION N/A 07/08/2016   Procedure: CARDIOVERSION;  Surgeon: Larey Dresser, MD;  Location: [facility];  Service: Cardiovascular;  Laterality: N/A;  . CERVICAL SPINE SURGERY  2005   Dr. Vertell Limber  . HYSTEROSCOPY  2001  . JOINT REPLACEMENT    . REPLACEMENT TOTAL KNEE Right 1996   [place] Bilateral [id number]   Baird Lyons MD    Allergies  Allergen Reactions  . Morphine Sulfate Other (See Comments)    "extremely nauseated"  . Other Nausea And Vomiting    Pt states she is very sensitive to pain medications.      Medication List       Accurate as of 09/15/16  1:51 PM. Always use your most recent med list.          acetaminophen 325 MG tablet Commonly known as:  TYLENOL Take 650 mg by mouth every 6 (six) hours as needed for mild pain.   amoxicillin 500 MG capsule Commonly known as:  AMOXIL  Take 2,000 mg by mouth See admin instructions. 1 hour prior to dental procedures   BENEFIBER PO Take 4 scoop by mouth daily.   BIOFREEZE 4 % Gel Generic drug:  Menthol (Topical Analgesic) Apply topically to neck four times a day.   CALCIUM 600 PO Take 600-900 mg by mouth 2 (two) times daily. Take one tablet 600 mg in morning, take 1 1/2 tablets in evening for calcium   CLARITIN 10 MG tablet Generic drug:  loratadine Take 10 mg by mouth daily.   diltiazem 120 MG 24 hr capsule Commonly known as:  CARDIZEM CD Take 1  capsule (120 mg total) by mouth daily.   docusate sodium 100 MG capsule Commonly known as:  COLACE Take 200 mg by mouth every evening.   enalapril 20 MG tablet Commonly known as:  VASOTEC Take 20 mg by mouth 2 (two) times daily.   furosemide 20 MG tablet Commonly known as:  LASIX Take 1 tablet (20 mg total) by mouth daily as needed (for weight gain 3 lbs in one day or 5 lbs in one week).   Magnesium 250 MG Tabs Take 250 mg by mouth daily.   metoprolol succinate 50 MG 24 hr tablet Commonly known as:  TOPROL-XL Take 50-100 mg by mouth 2 (two) times daily. Take 100mg  in the morning and 50mg  in the evening.   naproxen sodium 220 MG tablet Commonly known as:  ANAPROX Take 220 mg by mouth 2 (two) times daily with a meal. Breakfast and supper   OMEGA 3 PO Take 1 tablet by mouth daily.   OXYGEN Inhale 2 L into the lungs daily as needed (shortness of breath). Use 2 liter as needed for SOB, 02 sat < 90% on room air   potassium chloride SA 20 MEQ tablet Commonly known as:  KLOR-CON M20 Take 1 tablet (20 mEq total) by mouth daily as needed (for weight gain 3 lbs in one day or 5 lbs in one week).   predniSONE 1 MG tablet Commonly known as:  DELTASONE Take 4 mg by mouth daily with breakfast.   Rivaroxaban 15 MG Tabs tablet Commonly known as:  XARELTO Take 1 tablet (15 mg total) by mouth daily with supper.   vitamin C 500 MG tablet Commonly known as:  ASCORBIC ACID Take 500 mg by mouth daily.       Review of Systems  Constitutional: Negative for activity change, appetite change, chills, diaphoresis, fatigue, fever and unexpected weight change.  HENT: Positive for hearing loss. Negative for congestion, ear discharge, ear pain, postnasal drip, rhinorrhea, sore throat, tinnitus, trouble swallowing and voice change.        Xerostomia  Eyes: Negative for pain, redness, itching and visual disturbance.  Respiratory: Positive for shortness of breath. Negative for cough, choking and  wheezing.   Cardiovascular: Negative for chest pain, palpitations and leg swelling.       AF. HTN. HX CHF. Cardioversion 07/08/16 by Dr. Aundra Dubin.  Gastrointestinal: Negative for abdominal distention, abdominal pain, constipation, diarrhea and nausea.       Loss of appetite. Hx hemorrhoids.  Endocrine: Negative for cold intolerance, heat intolerance, polydipsia, polyphagia and polyuria.  Genitourinary: Negative for difficulty urinating, dysuria, flank pain, frequency, hematuria, pelvic pain, urgency and vaginal discharge.  Musculoskeletal: Positive for gait problem (using walker). Negative for arthralgias, back pain, myalgias, neck pain and neck stiffness.       HX RA and OA. OP, senile. Deconditioning and generalized weakness.  Patient 2 left shoulder replacements  and 1 right shoulder replacement. Also had a rightt knee replacement. Neck pain in the right posterior neck for 3-4 weeks.  Skin: Negative for color change, pallor and rash.       Forearm for about 2 months. Patient relates to cut, but it appears more treated keratoacanthoma and possible skin cancer.  Allergic/Immunologic: Negative.   Neurological: Positive for weakness (generalized). Negative for dizziness, tremors, seizures, syncope, numbness and headaches.       Paresthesias. Hx syncope.  Hematological: Negative for adenopathy. Bruises/bleeds easily.  Psychiatric/Behavioral: Negative for agitation, behavioral problems, confusion, dysphoric mood, hallucinations, sleep disturbance and suicidal ideas. The patient is not nervous/anxious and is not hyperactive.     Immunization History  Administered Date(s) Administered  . Influenza-Unspecified 09/13/2015  . Pneumococcal-Unspecified 05/14/2015  . Tdap 04/13/2011   Pertinent  Health Maintenance Due  Topic Date Due  . PNA vac Low Risk Adult (2 of 2 - PCV13) 05/13/2016  . INFLUENZA VACCINE  07/13/2016  . DEXA SCAN  Completed   Fall Risk  08/12/2016  Falls in the past year? No    Functional Status Survey:    Vitals:   09/15/16 1017  BP: 138/84  Pulse: 74  Resp: 20  Temp: 97.6 F (36.4 C)  Weight: 150 lb 6.4 oz (68.2 kg)  Height: 5\' 3"  (1.6 m)   Body mass index is 26.64 kg/m. Physical Exam  Constitutional: She is oriented to person, place, and time. She appears well-nourished. No distress.  Thin and frail  HENT:  Right Ear: External ear normal.  Left Ear: External ear normal.  Nose: Nose normal.  Mouth/Throat: Oropharynx is clear and moist. No oropharyngeal exudate.  Eyes: Conjunctivae and EOM are normal. Pupils are equal, round, and reactive to light. No scleral icterus.  Prescription lenses  Neck: No JVD present. No tracheal deviation present. No thyromegaly present.  Cardiovascular: Exam reveals no gallop and no friction rub.   No murmur heard. Atrial fibrillation with rate of about 110  Pulmonary/Chest: Effort normal. No respiratory distress. She has no wheezes. She has no rales. She exhibits no tenderness.  Abdominal: She exhibits no distension and no mass. There is no tenderness.  Musculoskeletal: Normal range of motion. She exhibits edema. She exhibits no tenderness.  Lymphadenopathy:    She has no cervical adenopathy.  Neurological: She is alert and oriented to person, place, and time. No cranial nerve deficit. Coordination normal.  Skin: No rash noted. She is not diaphoretic. No erythema. No pallor.  Keratoacanthoma of the let t forearm dorsal aspect  Psychiatric: She has a normal mood and affect. Her behavior is normal. Judgment and thought content normal.    Labs reviewed:  Recent Labs  05/05/16 0444  05/07/16 0301 05/10/16 1142 08/12/16 1932 09/02/16  NA 134*  < > 134* 136 133* 138  K 3.7  < > 3.9 3.8 3.7 3.9  CL 97*  < > 100* 99* 98*  --   CO2 28  < > 26 27 23   --   GLUCOSE 100*  < > 85 110* 109*  --   BUN 9  < > 6 8 13 13   CREATININE 0.87  < > 0.71 0.86 0.86 0.8  CALCIUM 9.1  < > 8.8* 9.8 9.5  --   MG 2.0  --   --   --    --   --   < > = values in this interval not displayed.  Recent Labs  05/03/16 1554 08/12/16 1932 09/02/16  AST 23 25 18   ALT 23 16 21   ALKPHOS 51 45 43  BILITOT 1.0 0.6  --   PROT 5.9* 5.7*  --   ALBUMIN 3.6 3.4*  --     Recent Labs  05/03/16 1554  05/07/16 0301 05/10/16 1142 08/12/16 1932 09/02/16  WBC 8.3  < > 5.6 6.7 7.3 6.2  NEUTROABS 7.0  --   --   --  4.3  --   HGB 12.6  < > 11.4* 12.8 13.8 13.5  HCT 39.0  < > 34.5* 39.2 43.0 40  MCV 93.8  < > 93.5 93.6 94.7  --   PLT 219  < > 207 261 214 202  < > = values in this interval not displayed. Lab Results  Component Value Date   TSH 1.54 09/02/2016   Lab Results  Component Value Date   HGBA1C 5.7 09/02/2016   Lab Results  Component Value Date   CHOL 160 05/04/2016   HDL 66 05/04/2016   LDLCALC 81 05/04/2016   TRIG 64 05/04/2016   CHOLHDL 2.4 05/04/2016    Significant Diagnostic Results in last 30 days:  No results found.  Assessment/Plan Edema BLE, continue daily weight ac breakfast, continue prn Furosemide, scheduled Furosemide 20mg  daiily, Kcl 66meq daily,  update CMP, CBC,  BNP 09/03/16 wbc 6.2, Hgb 13.5, plt 202, Na 138, K 3.9, Bun 13, creat 0.76, TSH 1.54, Hgb a1c 5.7, BNP 306.5   Essential hypertension Controlled, continue Diltiazem, Metoprolol, Enalapril, update CMP, BNP   Atrial fibrillation (HCC) Heat rate is in control, continue Metoprolol, Diltiazem, Xarelto   Chronic diastolic CHF (congestive heart failure), NYHA class 3 (HCC) Compensated clinically, noted BLE edema, prn Furosemide available to her, weight #150Ibs at her baseline. Update CMP, BNP 09/03/16 wbc 6.2, Hgb 13.5, plt 202, Na 138, K 3.9, Bun 13, creat 0.76, TSH 1.54, Hgb a1c 5.7, BNP 306.5 Furosemide 20mg , Kcl 24meq daily  Rheumatoid arthritis (HCC) Multiple sites, stable, continue Prednisone and Aleve   Cervicalgia 08/14/16 X-ray C spine: anterior interbody fusion cervical spine, severe degenerative disc disease C7-T1, has  shoulder prosthesis.     Chronic anticoagulation Continue Xarelto      Family/ staff Communication: continue AL for care assistance.   Labs/tests ordered:  CBC, CMP, BNP

## 2016-09-15 NOTE — Assessment & Plan Note (Signed)
08/14/16 X-ray C spine: anterior interbody fusion cervical spine, severe degenerative disc disease C7-T1, has shoulder prosthesis.

## 2016-09-15 NOTE — Assessment & Plan Note (Signed)
Continue Xarelto 

## 2016-09-15 NOTE — Assessment & Plan Note (Addendum)
BLE, continue daily weight ac breakfast, continue prn Furosemide, scheduled Furosemide 20mg  daiily, Kcl 22meq daily,  update CMP, CBC,  BNP 09/03/16 wbc 6.2, Hgb 13.5, plt 202, Na 138, K 3.9, Bun 13, creat 0.76, TSH 1.54, Hgb a1c 5.7, BNP 306.5

## 2016-09-21 ENCOUNTER — Telehealth: Payer: Self-pay | Admitting: Cardiology

## 2016-09-22 ENCOUNTER — Ambulatory Visit (INDEPENDENT_AMBULATORY_CARE_PROVIDER_SITE_OTHER): Payer: PPO | Admitting: Cardiology

## 2016-09-22 ENCOUNTER — Encounter: Payer: Self-pay | Admitting: Cardiology

## 2016-09-22 VITALS — BP 140/60 | HR 92 | Ht 63.0 in | Wt 151.4 lb

## 2016-09-22 DIAGNOSIS — I10 Essential (primary) hypertension: Secondary | ICD-10-CM | POA: Diagnosis not present

## 2016-09-22 DIAGNOSIS — R600 Localized edema: Secondary | ICD-10-CM | POA: Diagnosis not present

## 2016-09-22 DIAGNOSIS — Z7901 Long term (current) use of anticoagulants: Secondary | ICD-10-CM

## 2016-09-22 DIAGNOSIS — I480 Paroxysmal atrial fibrillation: Secondary | ICD-10-CM

## 2016-09-22 NOTE — Patient Instructions (Signed)
Medications:  Increase Lasix to 40 mg daily.   Testing:  Your physician has requested that you have an echocardiogram. Echocardiography is a painless test that uses sound waves to create images of your heart. It provides your doctor with information about the size and shape of your heart and how well your heart's chambers and valves are working. This procedure takes approximately one hour. There are no restrictions for this procedure. This will be performed at [address]   Follow-Up:  Your physician recommends that you schedule a follow-up appointment with Dr. Martinique after echo.

## 2016-09-22 NOTE — Assessment & Plan Note (Signed)
AF noted in the spring 2017. Cardioverted but did not hold. LVF normal May 2017

## 2016-09-22 NOTE — Assessment & Plan Note (Signed)
Controlled.  

## 2016-09-22 NOTE — Progress Notes (Signed)
09/22/2016 [name]   November 03, 1928  [id number]  Primary Physician Jeanmarie Hubert, MD Primary Cardiologist: Dr Martinique  HPI:  82 y/o female who we first saw for AF in May 2017. Her daughter, (a former Therapist, sports on the renal unit), says her mother was doing fine at [facility] until May 2017. She started to complain of dyspnea which was fairly rapid in onset. She presented to the ED and was noted to be in AF and CHF.  She was placed on Xarelto and diuresed. Her beta blocker was adjusted. She underwent OP DCCV 07/08/16 but failed to hold NSR and was back in AF with CVR by the end of August. She is in the office today with complaints of new LE edema with weeping areas. She was placed on lasix by her PCP without significant improvement. She denies any unusual dyspnea.    Current Outpatient Prescriptions  Medication Sig Dispense Refill  . acetaminophen (TYLENOL) 325 MG tablet Take 650 mg by mouth every 6 (six) hours as needed for mild pain.    Marland Kitchen amoxicillin (AMOXIL) 500 MG capsule Take 2,000 mg by mouth See admin instructions. 1 hour prior to dental procedures    . Calcium Carbonate (CALCIUM 600 PO) Take 600-900 mg by mouth 2 (two) times daily. Take one tablet 600 mg in morning, take 1 1/2 tablets in evening for calcium     . diltiazem (CARDIZEM CD) 120 MG 24 hr capsule Take 1 capsule (120 mg total) by mouth daily. 90 capsule 3  . docusate sodium (COLACE) 100 MG capsule Take 200 mg by mouth every evening.    . enalapril (VASOTEC) 20 MG tablet Take 20 mg by mouth 2 (two) times daily.      . furosemide (LASIX) 20 MG tablet Take 1 tablet (20 mg total) by mouth daily as needed (for weight gain 3 lbs in one day or 5 lbs in one week). (Patient taking differently: Take 20 mg by mouth daily. ) 90 tablet 3  . loratadine (CLARITIN) 10 MG tablet Take 10 mg by mouth daily.    . Magnesium 250 MG TABS Take 250 mg by mouth daily.      . Menthol, Topical Analgesic, (BIOFREEZE) 4 % GEL Apply  topically to neck four times a day.    . metoprolol succinate (TOPROL-XL) 50 MG 24 hr tablet Take 50-100 mg by mouth 2 (two) times daily. Take 100mg  in the morning and 50mg  in the evening.    . naproxen sodium (ANAPROX) 220 MG tablet Take 220 mg by mouth 2 (two) times daily with a meal. Breakfast and supper    . Omega-3 Fatty Acids (OMEGA 3 PO) Take 1 tablet by mouth daily.    . OXYGEN Inhale 2 L into the lungs daily as needed (shortness of breath). Use 2 liter as needed for SOB, 02 sat < 90% on room air     . potassium chloride SA (KLOR-CON M20) 20 MEQ tablet Take 1 tablet (20 mEq total) by mouth daily as needed (for weight gain 3 lbs in one day or 5 lbs in one week). (Patient taking differently: Take 20 mEq by mouth daily. ) 90 tablet 3  . predniSONE (DELTASONE) 1 MG tablet Take 4 mg by mouth daily with breakfast.    . Rivaroxaban (XARELTO) 15 MG TABS tablet Take 1 tablet (15 mg total) by mouth daily with supper. 90 tablet 3  . vitamin C (ASCORBIC ACID) 500 MG tablet Take 500 mg  by mouth daily.      . Wheat Dextrin (BENEFIBER PO) Take 4 scoop by mouth daily.      No current facility-administered medications for this visit.     Allergies  Allergen Reactions  . Morphine Sulfate Other (See Comments)    "extremely nauseated"  . Other Nausea And Vomiting    Pt states she is very sensitive to pain medications.    Social History   Social History  . Marital status: Widowed    Spouse name: N/A  . Number of children: N/A  . Years of education: N/A   Occupational History  . Retired    Social History Main Topics  . Smoking status: Former Smoker    Quit date: 12/13/1966  . Smokeless tobacco: Never Used  . Alcohol use No  . Drug use: No  . Sexual activity: No   Other Topics Concern  . Not on file   Social History Narrative   Living in [place] since 05/09/2016   Widowed   Never smoked   Alcohol none   Exercise -none. Sits in wheelchair using her feet to move around    DNR, POA, Living will     Review of Systems: General: negative for chills, fever, night sweats or weight changes.  Cardiovascular: negative for chest pain, dyspnea on exertion, edema, orthopnea, palpitations, paroxysmal nocturnal dyspnea or shortness of breath Dermatological: negative for rash Respiratory: negative for cough or wheezing Urologic: negative for hematuria Abdominal: negative for nausea, vomiting, diarrhea, bright red blood per rectum, melena, or hematemesis Neurologic: negative for visual changes, syncope, or dizziness All other systems reviewed and are otherwise negative except as noted above.    Blood pressure 140/60, pulse 92, height 5\' 3"  (1.6 m), weight 151 lb 6.4 oz (68.7 kg).  General appearance: alert, cooperative and no distress Neck: no JVD Lungs: clear to auscultation bilaterally Heart: irregularly irregular rhythm Extremities: bilateral 1-2+ pre tibial pitting edema Neurologic: Grossly normal  EKG AF with CVR-90  ASSESSMENT AND PLAN:   Edema leg New for her last few weeks. She has weeping edema in both legs, no change with addition of low dose Lasix.  Atrial fibrillation ([place]) AF noted in the spring 2017. Cardioverted but did not hold. LVF normal May 2017  Essential hypertension Controlled  Chronic anticoagulation Xarelto- CHADs VASc= 7 (sex, age, HTN, TIA, CHF May 2017)   PLAN  I suggested we increase her Lasix to 40 mg daily. I also suggested we repeat her echo to see if she has had a significant decline in her EF in atrial fibrillation. If she has no improvement on increased Lasix and her EF is normal would consider stopping her Diltiazem as a possible cause for LE edema. Ultimately we may need to consider another cardioversion with an antiarrythmic on board. She should follow up with Dr Martinique after her echo. Check BMP on increased Lasix when she comes in for her echo.  Kerin Ransom PA-C 09/22/2016 2:41 PM

## 2016-09-22 NOTE — Assessment & Plan Note (Signed)
Xarelto- CHADs VASc= 7 (sex, age, HTN, TIA, CHF May 2017)

## 2016-09-22 NOTE — Assessment & Plan Note (Signed)
New for her last few weeks. She has weeping edema in both legs, no change with addition of low dose Lasix.

## 2016-09-23 DIAGNOSIS — I10 Essential (primary) hypertension: Secondary | ICD-10-CM | POA: Diagnosis not present

## 2016-09-23 DIAGNOSIS — R0602 Shortness of breath: Secondary | ICD-10-CM | POA: Diagnosis not present

## 2016-09-23 LAB — CBC AND DIFFERENTIAL
HCT: 40 % (ref 36–46)
HEMOGLOBIN: 13.6 g/dL (ref 12.0–16.0)
Platelets: 201 10*3/uL (ref 150–399)
WBC: 5.3 10*3/mL

## 2016-09-23 LAB — BASIC METABOLIC PANEL
BUN: 15 mg/dL (ref 4–21)
CREATININE: 0.8 mg/dL (ref <=1.1)
GLUCOSE: 130 mg/dL
POTASSIUM: 4.3 mmol/L (ref 3.4–5.3)
Sodium: 138 mmol/L (ref 137–147)

## 2016-09-23 LAB — HEPATIC FUNCTION PANEL
ALT: 18 U/L (ref 7–35)
AST: 21 U/L (ref 13–35)
Alkaline Phosphatase: 47 U/L (ref 25–125)
Bilirubin, Total: 0.6 mg/dL

## 2016-09-29 ENCOUNTER — Non-Acute Institutional Stay: Payer: PPO | Admitting: Nurse Practitioner

## 2016-09-29 ENCOUNTER — Other Ambulatory Visit: Payer: Self-pay | Admitting: *Deleted

## 2016-09-29 ENCOUNTER — Encounter: Payer: Self-pay | Admitting: Nurse Practitioner

## 2016-09-29 DIAGNOSIS — M05711 Rheumatoid arthritis with rheumatoid factor of right shoulder without organ or systems involvement: Secondary | ICD-10-CM | POA: Diagnosis not present

## 2016-09-29 DIAGNOSIS — R079 Chest pain, unspecified: Secondary | ICD-10-CM | POA: Insufficient documentation

## 2016-09-29 DIAGNOSIS — I5032 Chronic diastolic (congestive) heart failure: Secondary | ICD-10-CM

## 2016-09-29 DIAGNOSIS — R600 Localized edema: Secondary | ICD-10-CM | POA: Diagnosis not present

## 2016-09-29 DIAGNOSIS — M542 Cervicalgia: Secondary | ICD-10-CM

## 2016-09-29 DIAGNOSIS — I10 Essential (primary) hypertension: Secondary | ICD-10-CM

## 2016-09-29 DIAGNOSIS — I4819 Other persistent atrial fibrillation: Secondary | ICD-10-CM

## 2016-09-29 DIAGNOSIS — I481 Persistent atrial fibrillation: Secondary | ICD-10-CM | POA: Diagnosis not present

## 2016-09-29 DIAGNOSIS — Z7901 Long term (current) use of anticoagulants: Secondary | ICD-10-CM

## 2016-09-29 NOTE — Assessment & Plan Note (Signed)
Continue Xarelto 

## 2016-09-29 NOTE — Progress Notes (Signed)
Location:  [facility] Room Number: [id number] Place of Service:  ALF [phone]) Provider:  Malaak Stach, Manxie  NP  Jeanmarie Hubert, MD  Patient Care Team: Estill Dooms, MD as PCP - General (Internal Medicine) Indie Boehne Otho Darner, NP as Nurse Practitioner (Internal Medicine) Peter M Martinique, MD as Consulting Physician (Cardiology) Lavonna Monarch, MD as Consulting Physician (Dermatology) Garald Balding, MD as Consulting Physician (Orthopedic Surgery)  Extended Emergency Contact Information Primary Emergency Contact: Berneta Levins Address: [address] [place], [place] [address] Johnnette Litter of [place] Phone: [phone] Work Phone: [phone] Mobile Phone: [phone] Relation: Daughter Secondary Emergency Contact: Azalee Course [place] of [place] Phone: [phone] Mobile Phone: [phone] Relation: Son  Code Status:  DNR Goals of care: Advanced Directive information Advanced Directives 09/29/2016  Does patient have an advance directive? Yes  Type of Paramedic of [place];Living will;Out of facility DNR (pink MOST or yellow form)  Does patient want to make changes to advanced directive? No - Patient declined  Copy of advanced directive(s) in chart? Yes  Would patient like information on creating an advanced directive? -     Chief Complaint  Patient presents with  . Acute Visit    Severe left sided pain, with deep breathing.    HPI:  Pt is a 82 y.o. female seen today for an acute visit for Resolved left sided pain with deep breath 10/17/17w/o intervention,  lasted several hours. Denied SOB, sense of doom, heartburn, indigestion associated with the event. She was afebrile, no noted nausea, vomiting, diarrhea, or constipation.     Hx Afib, EKG 08/31/16 at [facility] AFib, taking Xareltp 15mg  daily, heart rate is in control while on Diltiazem 120mg , Metoprolol 50mg  bid, Enalapril 20mg  bid. HTN blood pressure is controlled.  Edema, BLE, trace, Furosemide 40mg  daily.   Past Medical History:  Diagnosis Date  . Cervicalgia 08/12/2016  . CHF (congestive heart failure) ([place])   . Chronic diastolic CHF (congestive heart failure), NYHA class 3 ([place]) 06/17/2016  . Complication of anesthesia   . Edema 08/12/2016  . Hypertension   . New onset atrial fibrillation ([place]) 04/2016   a. started on Xarelto and Cardizem  . PONV (postoperative nausea and vomiting)   . RA (rheumatoid arthritis) ([place])   . RA (rheumatoid arthritis) ([place])   . SOB (shortness of breath) 04/2016   Hospitalist  . Vertigo    Past Surgical History:  Procedure Laterality Date  . CARDIOVERSION N/A 07/08/2016   Procedure: CARDIOVERSION;  Surgeon: Larey Dresser, MD;  Location: [place] [facility];  Service: Cardiovascular;  Laterality: N/A;  . CERVICAL SPINE SURGERY  2005   Dr. Vertell Limber  . HYSTEROSCOPY  2001  . JOINT REPLACEMENT    . REPLACEMENT TOTAL KNEE Right 1996   [place] Bilateral [id number]   Baird Lyons MD    Allergies  Allergen Reactions  . Morphine Sulfate Other (See Comments)    "extremely nauseated"  . Other Nausea And Vomiting    Pt states she is very sensitive to pain medications.      Medication List       Accurate as of 09/29/16  2:44 PM. Always use your most recent med list.          acetaminophen 325 MG tablet Commonly known as:  TYLENOL Take 650 mg by mouth every 6 (six) hours as needed for mild pain.   amoxicillin 500  MG capsule Commonly known as:  AMOXIL Take 2,000 mg by mouth See admin instructions. 1 hour prior to dental procedures   BENEFIBER PO Take 4 scoop by mouth daily.   BIOFREEZE 4 % Gel Generic drug:  Menthol (Topical Analgesic) Apply topically to neck four times a day.   CALCIUM 600 PO Take 600-900 mg by mouth 2 (two) times daily. Take one tablet 600 mg in morning, take 1 1/2 tablets in evening for calcium   CLARITIN 10 MG tablet Generic drug:  loratadine Take 10  mg by mouth daily.   diltiazem 120 MG 24 hr capsule Commonly known as:  CARDIZEM CD Take 1 capsule (120 mg total) by mouth daily.   docusate sodium 100 MG capsule Commonly known as:  COLACE Take 200 mg by mouth every evening.   enalapril 20 MG tablet Commonly known as:  VASOTEC Take 20 mg by mouth 2 (two) times daily.   furosemide 20 MG tablet Commonly known as:  LASIX Take 40 mg by mouth daily.   Magnesium 250 MG Tabs Take 250 mg by mouth daily.   metoprolol succinate 50 MG 24 hr tablet Commonly known as:  TOPROL-XL Take 50-100 mg by mouth 2 (two) times daily. Take 100mg  in the morning and 50mg  in the evening.   OMEGA 3 PO Take 1 tablet by mouth daily.   OXYGEN Inhale 2 L into the lungs daily as needed (shortness of breath). Use 2 liter as needed for SOB, 02 sat < 90% on room air   potassium chloride SA 20 MEQ tablet Commonly known as:  KLOR-CON M20 Take 1 tablet (20 mEq total) by mouth daily as needed (for weight gain 3 lbs in one day or 5 lbs in one week).   predniSONE 1 MG tablet Commonly known as:  DELTASONE Take 4 mg by mouth daily with breakfast.   Rivaroxaban 15 MG Tabs tablet Commonly known as:  XARELTO Take 1 tablet (15 mg total) by mouth daily with supper.   vitamin C 500 MG tablet Commonly known as:  ASCORBIC ACID Take 500 mg by mouth daily.       Review of Systems  Constitutional: Negative for activity change, appetite change, chills, diaphoresis, fatigue, fever and unexpected weight change.  HENT: Positive for hearing loss. Negative for congestion, ear discharge, ear pain, postnasal drip, rhinorrhea, sore throat, tinnitus, trouble swallowing and voice change.        Xerostomia  Eyes: Negative for pain, redness, itching and visual disturbance.  Respiratory: Positive for shortness of breath. Negative for cough, choking and wheezing.        Resolved left sided pain with deep breath 10/17/17w/o intervention,  lasted several hours.   Cardiovascular:  Positive for leg swelling. Negative for chest pain and palpitations.       AF. HTN. HX CHF. Cardioversion 07/08/16 by Dr. Aundra Dubin.  Gastrointestinal: Negative for abdominal distention, abdominal pain, constipation, diarrhea and nausea.       Loss of appetite. Hx hemorrhoids.  Endocrine: Negative for cold intolerance, heat intolerance, polydipsia, polyphagia and polyuria.  Genitourinary: Negative for difficulty urinating, dysuria, flank pain, frequency, hematuria, pelvic pain, urgency and vaginal discharge.  Musculoskeletal: Positive for gait problem (using walker). Negative for arthralgias, back pain, myalgias, neck pain and neck stiffness.       HX RA and OA. OP, senile. Deconditioning and generalized weakness.  Patient 2 left shoulder replacements and 1 right shoulder replacement. Also had a rightt knee replacement. Neck pain in the right posterior  neck for 3-4 weeks.  Skin: Negative for color change, pallor and rash.       Forearm for about 2 months. Patient relates to cut, but it appears more treated keratoacanthoma and possible skin cancer.  Allergic/Immunologic: Negative.   Neurological: Positive for weakness (generalized). Negative for dizziness, tremors, seizures, syncope, numbness and headaches.       Paresthesias. Hx syncope.  Hematological: Negative for adenopathy. Bruises/bleeds easily.  Psychiatric/Behavioral: Negative for agitation, behavioral problems, confusion, dysphoric mood, hallucinations, sleep disturbance and suicidal ideas. The patient is not nervous/anxious and is not hyperactive.     Immunization History  Administered Date(s) Administered  . Influenza-Unspecified 09/13/2015  . Pneumococcal-Unspecified 05/14/2015  . Tdap 04/13/2011   Pertinent  Health Maintenance Due  Topic Date Due  . PNA vac Low Risk Adult (2 of 2 - PCV13) 05/13/2016  . INFLUENZA VACCINE  07/13/2016  . DEXA SCAN  Completed   Fall Risk  08/12/2016  Falls in the past year? No   Functional  Status Survey:    Vitals:   09/29/16 0908  BP: (!) 160/82  Pulse: 72  Resp: 18  Temp: 97.8 F (36.6 C)  SpO2: 94%  Weight: 150 lb 6.4 oz (68.2 kg)  Height: 5\' 3"  (1.6 m)   Body mass index is 26.64 kg/m. Physical Exam  Constitutional: She is oriented to person, place, and time. She appears well-nourished. No distress.  Thin and frail  HENT:  Right Ear: External ear normal.  Left Ear: External ear normal.  Nose: Nose normal.  Mouth/Throat: Oropharynx is clear and moist. No oropharyngeal exudate.  Eyes: Conjunctivae and EOM are normal. Pupils are equal, round, and reactive to light. No scleral icterus.  Prescription lenses  Neck: No JVD present. No tracheal deviation present. No thyromegaly present.  Cardiovascular: Exam reveals no gallop and no friction rub.   No murmur heard. Atrial fibrillation with rate of about 110  Pulmonary/Chest: Effort normal. No respiratory distress. She has no wheezes. She has no rales. She exhibits no tenderness.  Abdominal: She exhibits no distension and no mass. There is no tenderness.  Musculoskeletal: Normal range of motion. She exhibits edema. She exhibits no tenderness.  Trace edema BLE  Lymphadenopathy:    She has no cervical adenopathy.  Neurological: She is alert and oriented to person, place, and time. No cranial nerve deficit. Coordination normal.  Skin: No rash noted. She is not diaphoretic. No erythema. No pallor.  Keratoacanthoma of the let t forearm dorsal aspect  Psychiatric: She has a normal mood and affect. Her behavior is normal. Judgment and thought content normal.    Labs reviewed:  Recent Labs  05/05/16 0444  05/07/16 0301 05/10/16 1142 08/12/16 1932 09/02/16 09/23/16  NA 134*  < > 134* 136 133* 138 138  K 3.7  < > 3.9 3.8 3.7 3.9 4.3  CL 97*  < > 100* 99* 98*  --   --   CO2 28  < > 26 27 23   --   --   GLUCOSE 100*  < > 85 110* 109*  --   --   BUN 9  < > 6 8 13 13 15   CREATININE 0.87  < > 0.71 0.86 0.86 0.8 0.8    CALCIUM 9.1  < > 8.8* 9.8 9.5  --   --   MG 2.0  --   --   --   --   --   --   < > = values in this interval  not displayed.  Recent Labs  05/03/16 1554 08/12/16 1932 09/02/16 09/23/16  AST 23 25 18 21   ALT 23 16 21 18   ALKPHOS 51 45 43 47  BILITOT 1.0 0.6  --   --   PROT 5.9* 5.7*  --   --   ALBUMIN 3.6 3.4*  --   --     Recent Labs  05/03/16 1554  05/07/16 0301 05/10/16 1142 08/12/16 1932 09/02/16 09/23/16  WBC 8.3  < > 5.6 6.7 7.3 6.2 5.3  NEUTROABS 7.0  --   --   --  4.3  --   --   HGB 12.6  < > 11.4* 12.8 13.8 13.5 13.6  HCT 39.0  < > 34.5* 39.2 43.0 40 40  MCV 93.8  < > 93.5 93.6 94.7  --   --   PLT 219  < > 207 261 214 202 201  < > = values in this interval not displayed. Lab Results  Component Value Date   TSH 1.54 09/02/2016   Lab Results  Component Value Date   HGBA1C 5.7 09/02/2016   Lab Results  Component Value Date   CHOL 160 05/04/2016   HDL 66 05/04/2016   LDLCALC 81 05/04/2016   TRIG 64 05/04/2016   CHOLHDL 2.4 05/04/2016    Significant Diagnostic Results in last 30 days:  No results found.  Assessment/Plan Left sided chest pain 09/28/16 evening, lasted a few hours, resolved w/o intervention, not associated with SOB, nausea, vomiting, constipation, diarrhea, cough, phlegm production, or O2 desaturation.  09/23/16 wbc 5.3, Hgb 13.6, plt 201, Na 138, K 4.3, Bun 15, creat 0.81, BNP 318.5. Observe the patient.   Edema leg BLE, improved, continue Furosemide 40mg , 09/23/16 wbc 5.3, Hgb 13.6, plt 201, Na 138, K 4.3, Bun 15, creat 0.81, BNP 318.   Cervicalgia 08/14/16 X-ray C spine: anterior interbody fusion cervical spine, severe degenerative disc disease C7-T1, has shoulder prosthesis.     Chronic anticoagulation Continue Xarelto   Rheumatoid arthritis (HCC) Multiple sites, stable, continue Prednisone and prn Tylenol    Chronic diastolic CHF (congestive heart failure), NYHA class 3 (HCC) Compensated clinically, noted BLE edema,  improved, continue Furosemide 40mg , Kcl 67meq daily  Atrial fibrillation (HCC) Heat rate is in control, continue Metoprolol, Diltiazem, Xarelto   Essential hypertension Controlled, continue Diltiazem, Metoprolol, Enalapril     Family/ staff Communication: continue AL for care assistance.   Labs/tests ordered:  CBC, CMP, BNP done 09/23/16

## 2016-09-29 NOTE — Assessment & Plan Note (Signed)
BLE, improved, continue Furosemide 40mg , 09/23/16 wbc 5.3, Hgb 13.6, plt 201, Na 138, K 4.3, Bun 15, creat 0.81, BNP 318.

## 2016-09-29 NOTE — Assessment & Plan Note (Signed)
Heat rate is in control, continue Metoprolol, Diltiazem, Xarelto

## 2016-09-29 NOTE — Assessment & Plan Note (Signed)
Compensated clinically, noted BLE edema, improved, continue Furosemide 40mg , Kcl 22meq daily

## 2016-09-29 NOTE — Assessment & Plan Note (Addendum)
09/28/16 evening, lasted a few hours, resolved w/o intervention, not associated with SOB, nausea, vomiting, constipation, diarrhea, cough, phlegm production, or O2 desaturation.  09/23/16 wbc 5.3, Hgb 13.6, plt 201, Na 138, K 4.3, Bun 15, creat 0.81, BNP 318.5. Observe the patient.

## 2016-09-29 NOTE — Assessment & Plan Note (Signed)
Controlled, continue Diltiazem, Metoprolol, Enalapril

## 2016-09-29 NOTE — Assessment & Plan Note (Signed)
08/14/16 X-ray C spine: anterior interbody fusion cervical spine, severe degenerative disc disease C7-T1, has shoulder prosthesis.

## 2016-09-29 NOTE — Assessment & Plan Note (Addendum)
Multiple sites, stable, continue Prednisone and prn Tylenol

## 2016-10-08 ENCOUNTER — Other Ambulatory Visit: Payer: Self-pay

## 2016-10-08 ENCOUNTER — Ambulatory Visit (HOSPITAL_COMMUNITY): Payer: PPO | Attending: Cardiovascular Disease

## 2016-10-08 DIAGNOSIS — I480 Paroxysmal atrial fibrillation: Secondary | ICD-10-CM | POA: Insufficient documentation

## 2016-10-08 DIAGNOSIS — I083 Combined rheumatic disorders of mitral, aortic and tricuspid valves: Secondary | ICD-10-CM | POA: Diagnosis not present

## 2016-10-13 NOTE — Telephone Encounter (Signed)
Closed encounter °

## 2016-10-14 DIAGNOSIS — I10 Essential (primary) hypertension: Secondary | ICD-10-CM | POA: Diagnosis not present

## 2016-10-14 LAB — BASIC METABOLIC PANEL
BUN: 19 mg/dL (ref 4–21)
Creatinine: 0.8 mg/dL (ref <=1.1)
Glucose: 85 mg/dL
Potassium: 4.5 mmol/L (ref 3.4–5.3)
SODIUM: 138 mmol/L (ref 137–147)

## 2016-10-20 ENCOUNTER — Other Ambulatory Visit: Payer: Self-pay | Admitting: *Deleted

## 2016-10-20 NOTE — Progress Notes (Signed)
10/21/2016 [name]   Mar 22, 1928  [id number]  Primary Physician Jeanmarie Hubert, MD Primary Cardiologist: Dr Chrystal Zeimet Martinique MD  HPI:  82 y/o female who we first saw for AF in May 2017. Her daughter, (a former Therapist, sports on the renal unit), says her mother was doing fine at [facility] Living until May 2017. She started to complain of dyspnea which was fairly rapid in onset. She presented to the ED and was noted to be in AF and CHF.  She was placed on Xarelto and diuresed. Her beta blocker was adjusted. She underwent OP DCCV 07/08/16 but failed to hold NSR and was back in AF with CVR by the end of August. She was last seen with complaints of new LE edema with weeping areas. She was placed on lasix by her PCP without significant improvement. Her lasix dose was increased to 40 mg daily. Echo was done as noted below. She is seen today for follow up.  She states she is doing much better. Edema has improved dramatically and is no longer weeping. She denies any dyspnea, orthopnea, or PND. No chest pain or dizziness.    Current Outpatient Prescriptions  Medication Sig Dispense Refill  . acetaminophen (TYLENOL) 325 MG tablet Take 650 mg by mouth every 6 (six) hours as needed for mild pain.    Marland Kitchen amoxicillin (AMOXIL) 500 MG capsule Take 2,000 mg by mouth See admin instructions. 1 hour prior to dental procedures    . Calcium Carbonate (CALCIUM 600 PO) Take 600-900 mg by mouth 2 (two) times daily. Take one tablet 600 mg in morning, take 1 1/2 tablets in evening for calcium     . diltiazem (CARDIZEM CD) 120 MG 24 hr capsule Take 1 capsule (120 mg total) by mouth daily. 90 capsule 3  . docusate sodium (COLACE) 100 MG capsule Take 200 mg by mouth every evening.    . enalapril (VASOTEC) 20 MG tablet Take 20 mg by mouth 2 (two) times daily.      . furosemide (LASIX) 20 MG tablet Take 40 mg by mouth daily.    Marland Kitchen loratadine (CLARITIN) 10 MG tablet Take 10 mg by mouth daily.    . Magnesium 250 MG TABS Take  250 mg by mouth daily.      . Menthol, Topical Analgesic, (BIOFREEZE) 4 % GEL Apply topically to neck four times a day.    . metoprolol succinate (TOPROL-XL) 50 MG 24 hr tablet Take 50-100 mg by mouth 2 (two) times daily. Take 100mg  in the morning and 50mg  in the evening.    . Omega-3 Fatty Acids (OMEGA 3 PO) Take 1 tablet by mouth daily.    . OXYGEN Inhale 2 L into the lungs daily as needed (shortness of breath). Use 2 liter as needed for SOB, 02 sat < 90% on room air     . potassium chloride SA (KLOR-CON M20) 20 MEQ tablet Take 1 tablet (20 mEq total) by mouth daily as needed (for weight gain 3 lbs in one day or 5 lbs in one week). (Patient taking differently: Take 20 mEq by mouth daily. ) 90 tablet 3  . predniSONE (DELTASONE) 1 MG tablet Take 4 mg by mouth daily with breakfast.    . Rivaroxaban (XARELTO) 15 MG TABS tablet Take 1 tablet (15 mg total) by mouth daily with supper. 90 tablet 3  . vitamin C (ASCORBIC ACID) 500 MG tablet Take 500 mg by mouth daily.      Marland Kitchen  Wheat Dextrin (BENEFIBER PO) Take 4 scoop by mouth daily.      No current facility-administered medications for this visit.     Allergies  Allergen Reactions  . Morphine Sulfate Other (See Comments)    "extremely nauseated"  . Other Nausea And Vomiting    Pt states she is very sensitive to pain medications.    Social History   Social History  . Marital status: Widowed    Spouse name: N/A  . Number of children: N/A  . Years of education: N/A   Occupational History  . Retired    Social History Main Topics  . Smoking status: Former Smoker    Quit date: 12/13/1966  . Smokeless tobacco: Never Used  . Alcohol use No  . Drug use: No  . Sexual activity: No   Other Topics Concern  . Not on file   Social History Narrative   Living in [place] since 05/09/2016   Widowed   Never smoked   Alcohol none   Exercise -none. Sits in wheelchair using her feet to move around   DNR, POA, Living will     Review  of Systems: As noted in HPI All other systems reviewed and are otherwise negative except as noted above.    Blood pressure (!) 154/82, pulse 80, height 5\' 3"  (1.6 m), weight 148 lb 3.2 oz (67.2 kg).  General appearance: alert, cooperative and no distress Neck: no JVD Lungs: clear to auscultation bilaterally Heart: irregularly irregular rhythm Extremities: bilateral tr-1+ pre tibial  edema Neurologic: Grossly normal   Lab Results  Component Value Date   WBC 5.3 09/23/2016   HGB 13.6 09/23/2016   HCT 40 09/23/2016   PLT 201 09/23/2016   GLUCOSE 109 (H) 08/12/2016   CHOL 160 05/04/2016   TRIG 64 05/04/2016   HDL 66 05/04/2016   LDLCALC 81 05/04/2016   ALT 18 09/23/2016   AST 21 09/23/2016   NA 138 10/14/2016   K 4.5 10/14/2016   CL 98 (L) 08/12/2016   CREATININE 0.8 10/14/2016   BUN 19 10/14/2016   CO2 23 08/12/2016   TSH 1.54 09/02/2016   INR 1.23 05/03/2016   HGBA1C 5.7 09/02/2016     EKG AF with CVR-90  Echo: 10/08/16:  Study Conclusions  - Left ventricle: The cavity size was normal. Systolic function was   normal. The estimated ejection fraction was in the range of 60%   to 65%. Wall motion was normal; there were no regional wall   motion abnormalities. - Aortic valve: There was trivial regurgitation. - Mitral valve: Calcified annulus. Mildly thickened leaflets .   There was mild regurgitation. - Left atrium: The atrium was moderately dilated. - Right atrium: The atrium was moderately dilated. - Tricuspid valve: There was mild-moderate regurgitation. There was   evidence of perivalvular regurgitation. - Pulmonary arteries: Systolic pressure was moderately increased.   PA peak pressure: 54 mm Hg (S).  ASSESSMENT AND PLAN:  1. Atrial fibrillation- persistent and probably permanent. Rate is well controlled. She is asymptomatic. On Xarelto for anticoagulation. I would continue strategy of rate control and anticoagulation.  2. Chronic diastolic CHF. Edema  much improved on higher lasix dose. Will continue current therapy. Stressed importance of sodium restriction. Weigh daily.  3. RA 4. HTN.  Follow up in 6 months.  Lenya Sterne Martinique MD,FACC 10/21/2016 12:06 PM

## 2016-10-21 ENCOUNTER — Encounter: Payer: Self-pay | Admitting: Cardiology

## 2016-10-21 ENCOUNTER — Ambulatory Visit (INDEPENDENT_AMBULATORY_CARE_PROVIDER_SITE_OTHER): Payer: PPO | Admitting: Cardiology

## 2016-10-21 VITALS — BP 154/82 | HR 80 | Ht 63.0 in | Wt 148.2 lb

## 2016-10-21 DIAGNOSIS — I5032 Chronic diastolic (congestive) heart failure: Secondary | ICD-10-CM

## 2016-10-21 DIAGNOSIS — I10 Essential (primary) hypertension: Secondary | ICD-10-CM | POA: Diagnosis not present

## 2016-10-21 DIAGNOSIS — I4819 Other persistent atrial fibrillation: Secondary | ICD-10-CM

## 2016-10-21 DIAGNOSIS — I481 Persistent atrial fibrillation: Secondary | ICD-10-CM

## 2016-10-21 DIAGNOSIS — R609 Edema, unspecified: Secondary | ICD-10-CM | POA: Diagnosis not present

## 2016-10-21 NOTE — Patient Instructions (Signed)
Continue your current therapy  Watch your sodium intake and try and avoid salty foods.  I will see you in 6 months.

## 2016-11-11 ENCOUNTER — Non-Acute Institutional Stay: Payer: PPO | Admitting: Nurse Practitioner

## 2016-11-11 ENCOUNTER — Encounter: Payer: Self-pay | Admitting: Nurse Practitioner

## 2016-11-11 DIAGNOSIS — I480 Paroxysmal atrial fibrillation: Secondary | ICD-10-CM

## 2016-11-11 DIAGNOSIS — R609 Edema, unspecified: Secondary | ICD-10-CM

## 2016-11-11 DIAGNOSIS — I10 Essential (primary) hypertension: Secondary | ICD-10-CM | POA: Diagnosis not present

## 2016-11-11 DIAGNOSIS — I5032 Chronic diastolic (congestive) heart failure: Secondary | ICD-10-CM | POA: Diagnosis not present

## 2016-11-11 DIAGNOSIS — M069 Rheumatoid arthritis, unspecified: Secondary | ICD-10-CM

## 2016-11-11 LAB — LIPID PANEL
Cholesterol: 141 mg/dL (ref 0–200)
HDL: 74 mg/dL — AB (ref 35–70)
LDL CALC: 50 mg/dL
Triglycerides: 87 mg/dL (ref 40–160)

## 2016-11-11 LAB — BASIC METABOLIC PANEL
BUN: 22 mg/dL — AB (ref 4–21)
Creatinine: 1 mg/dL (ref 0.5–1.1)
GLUCOSE: 90 mg/dL
POTASSIUM: 4.3 mmol/L (ref 3.4–5.3)
SODIUM: 137 mmol/L (ref 137–147)

## 2016-11-11 LAB — HEPATIC FUNCTION PANEL
ALT: 10 U/L (ref 7–35)
AST: 17 U/L (ref 13–35)
Alkaline Phosphatase: 51 U/L (ref 25–125)
BILIRUBIN, TOTAL: 0.5 mg/dL

## 2016-11-11 LAB — TSH: TSH: 2.12 u[IU]/mL (ref 0.41–5.90)

## 2016-11-11 NOTE — Assessment & Plan Note (Signed)
10/14/16 Na 138, K 4.5, Bun 19, creat 0.77 Heat rate is in control, continue Metoprolol, Diltiazem, Xarelto

## 2016-11-11 NOTE — Progress Notes (Signed)
Location:  [facility] Room Number: [id number] Place of Service:  ALF [phone]) Provider:  [place] Hank, Manxie  NP  Jeanmarie Hubert, MD  Patient Care Team: Estill Dooms, MD as PCP - General (Internal Medicine) Laportia Carley Otho Darner, NP as Nurse Practitioner (Internal Medicine) Peter M Martinique, MD as Consulting Physician (Cardiology) Lavonna Monarch, MD as Consulting Physician (Dermatology) Garald Balding, MD as Consulting Physician (Orthopedic Surgery)  Extended Emergency Contact Information Primary Emergency Contact: Berneta Levins Address: [address] [place], [place] [address] Johnnette Litter of [place] Phone: [phone] Work Phone: [phone] Mobile Phone: [phone] Relation: Daughter Secondary Emergency Contact: Azalee Course [place] of [place] Phone: [phone] Mobile Phone: [phone] Relation: Son  Code Status:  DNR Goals of care: Advanced Directive information Advanced Directives 11/11/2016  Does Patient Have a Medical Advance Directive? Yes  Type of Paramedic of [place];Living will;Out of facility DNR (pink MOST or yellow form)  Does patient want to make changes to medical advance directive? No - Patient declined  Copy of [place] in Chart? Yes  Would patient like information on creating a medical advance directive? -     Chief Complaint  Patient presents with  . Medical Management of Chronic Issues    HPI:  Pt is a 82 y.o. female seen today for medical management of chronic diseases.      Hx Afib, EKG 08/31/16 at [facility] AFib, taking Xareltp 15mg  daily, heart rate is in control while on Diltiazem 120mg , Metoprolol 50mg  bid, Enalapril 20mg  bid. HTN blood pressure is controlled. Edema, BLE, trace, Furosemide 40mg  daily.  Past Medical History:  Diagnosis Date  . Cervicalgia 08/12/2016  . CHF (congestive heart failure) ([place])   . Chronic diastolic CHF (congestive heart failure), NYHA class 3  ([place]) 06/17/2016  . Complication of anesthesia   . Edema 08/12/2016  . Hypertension   . New onset atrial fibrillation ([place]) 04/2016   a. started on Xarelto and Cardizem  . PONV (postoperative nausea and vomiting)   . RA (rheumatoid arthritis) ([place])   . RA (rheumatoid arthritis) ([place])   . SOB (shortness of breath) 04/2016   Hospitalist  . Vertigo    Past Surgical History:  Procedure Laterality Date  . CARDIOVERSION N/A 07/08/2016   Procedure: CARDIOVERSION;  Surgeon: Larey Dresser, MD;  Location: [facility];  Service: Cardiovascular;  Laterality: N/A;  . CERVICAL SPINE SURGERY  2005   Dr. Vertell Limber  . HYSTEROSCOPY  2001  . JOINT REPLACEMENT    . REPLACEMENT TOTAL KNEE Right 1996   [place] Bilateral [id number]   Baird Lyons MD    Allergies  Allergen Reactions  . Morphine Sulfate Other (See Comments)    "extremely nauseated"  . Other Nausea And Vomiting    Pt states she is very sensitive to pain medications.      Medication List       Accurate as of 11/11/16  1:36 PM. Always use your most recent med list.          acetaminophen 325 MG tablet Commonly known as:  TYLENOL Take 650 mg by mouth every 6 (six) hours as needed for mild pain.   amoxicillin 500 MG capsule Commonly known as:  AMOXIL Take 2,000 mg by mouth See admin instructions. 1 hour prior to dental procedures   BENEFIBER PO Take 4 scoop by mouth daily.   BIOFREEZE 4 %  Gel Generic drug:  Menthol (Topical Analgesic) Apply topically to neck four times a day.   CALCIUM 600 PO Take 600-900 mg by mouth 2 (two) times daily. Take one tablet 600 mg in morning, take 1 1/2 tablets in evening for calcium   CLARITIN 10 MG tablet Generic drug:  loratadine Take 10 mg by mouth daily.   diltiazem 120 MG 24 hr capsule Commonly known as:  CARDIZEM CD Take 1 capsule (120 mg total) by mouth daily.   docusate sodium 100 MG capsule Commonly known as:  COLACE Take 200 mg by mouth  every evening.   enalapril 20 MG tablet Commonly known as:  VASOTEC Take 20 mg by mouth 2 (two) times daily.   furosemide 20 MG tablet Commonly known as:  LASIX Take 40 mg by mouth daily.   Magnesium 250 MG Tabs Take 250 mg by mouth daily.   metoprolol succinate 50 MG 24 hr tablet Commonly known as:  TOPROL-XL Take 50-100 mg by mouth 2 (two) times daily. Take 100mg  in the morning and 50mg  in the evening.   OMEGA 3 PO Take 1 tablet by mouth daily.   OXYGEN Inhale 2 L into the lungs daily as needed (shortness of breath). Use 2 liter as needed for SOB, 02 sat < 90% on room air   potassium chloride SA 20 MEQ tablet Commonly known as:  KLOR-CON M20 Take 1 tablet (20 mEq total) by mouth daily as needed (for weight gain 3 lbs in one day or 5 lbs in one week).   predniSONE 1 MG tablet Commonly known as:  DELTASONE Take 4 mg by mouth daily with breakfast.   Rivaroxaban 15 MG Tabs tablet Commonly known as:  XARELTO Take 1 tablet (15 mg total) by mouth daily with supper.   vitamin C 500 MG tablet Commonly known as:  ASCORBIC ACID Take 500 mg by mouth daily.       Review of Systems  Constitutional: Negative for activity change, appetite change, chills, diaphoresis, fatigue, fever and unexpected weight change.  HENT: Positive for hearing loss. Negative for congestion, ear discharge, ear pain, postnasal drip, rhinorrhea, sore throat, tinnitus, trouble swallowing and voice change.        Xerostomia  Eyes: Negative for pain, redness, itching and visual disturbance.  Respiratory: Negative for cough, choking, shortness of breath and wheezing.        Resolved left sided pain with deep breath 10/17/17w/o intervention,  lasted several hours.   Cardiovascular: Positive for leg swelling. Negative for chest pain and palpitations.       AF. HTN. HX CHF. Cardioversion 07/08/16 by Dr. Aundra Dubin.  Gastrointestinal: Negative for abdominal distention, abdominal pain, constipation, diarrhea and  nausea.       Loss of appetite. Hx hemorrhoids.  Endocrine: Negative for cold intolerance, heat intolerance, polydipsia, polyphagia and polyuria.  Genitourinary: Negative for difficulty urinating, dysuria, flank pain, frequency, hematuria, pelvic pain, urgency and vaginal discharge.  Musculoskeletal: Positive for gait problem (using walker). Negative for arthralgias, back pain, myalgias, neck pain and neck stiffness.       HX RA and OA. OP, senile. Deconditioning and generalized weakness.  Patient 2 left shoulder replacements and 1 right shoulder replacement. Also had a rightt knee replacement. Neck pain in the right posterior neck for 3-4 weeks.  Skin: Negative for color change, pallor and rash.       Forearm for about 2 months. Patient relates to cut, but it appears more treated keratoacanthoma and possible skin cancer.  Allergic/Immunologic: Negative.   Neurological: Positive for weakness (generalized). Negative for dizziness, tremors, seizures, syncope, numbness and headaches.       Paresthesias. Hx syncope.  Hematological: Negative for adenopathy. Bruises/bleeds easily.  Psychiatric/Behavioral: Negative for agitation, behavioral problems, confusion, dysphoric mood, hallucinations, sleep disturbance and suicidal ideas. The patient is not nervous/anxious and is not hyperactive.     Immunization History  Administered Date(s) Administered  . Influenza-Unspecified 09/13/2015, 09/29/2016  . Pneumococcal-Unspecified 05/14/2015  . Tdap 04/13/2011   Pertinent  Health Maintenance Due  Topic Date Due  . PNA vac Low Risk Adult (2 of 2 - PCV13) 05/13/2016  . INFLUENZA VACCINE  Completed  . DEXA SCAN  Completed   Fall Risk  08/12/2016  Falls in the past year? No   Functional Status Survey:    Vitals:   11/11/16 1316  BP: 138/76  Pulse: 74  Resp: 20  Temp: 97.4 F (36.3 C)  Weight: 147 lb 9.6 oz (67 kg)  Height: 5\' 3"  (1.6 m)   Body mass index is 26.15 kg/m. Physical Exam    Constitutional: She is oriented to person, place, and time. She appears well-nourished. No distress.  Thin and frail  HENT:  Right Ear: External ear normal.  Left Ear: External ear normal.  Nose: Nose normal.  Mouth/Throat: Oropharynx is clear and moist. No oropharyngeal exudate.  Eyes: Conjunctivae and EOM are normal. Pupils are equal, round, and reactive to light. No scleral icterus.  Prescription lenses  Neck: No JVD present. No tracheal deviation present. No thyromegaly present.  Cardiovascular: Exam reveals no gallop and no friction rub.   No murmur heard. Atrial fibrillation, heart rate is in control  Pulmonary/Chest: Effort normal. No respiratory distress. She has no wheezes. She has no rales. She exhibits no tenderness.  Abdominal: She exhibits no distension and no mass. There is no tenderness.  Musculoskeletal: Normal range of motion. She exhibits edema. She exhibits no tenderness.  Trace edema BLE  Lymphadenopathy:    She has no cervical adenopathy.  Neurological: She is alert and oriented to person, place, and time. No cranial nerve deficit. Coordination normal.  Skin: No rash noted. She is not diaphoretic. No erythema. No pallor.  Keratoacanthoma of the let t forearm dorsal aspect  Psychiatric: She has a normal mood and affect. Her behavior is normal. Judgment and thought content normal.    Labs reviewed:  Recent Labs  05/05/16 0444  05/07/16 0301 05/10/16 1142 08/12/16 1932 09/02/16 09/23/16 10/14/16  NA 134*  < > 134* 136 133* 138 138 138  K 3.7  < > 3.9 3.8 3.7 3.9 4.3 4.5  CL 97*  < > 100* 99* 98*  --   --   --   CO2 28  < > 26 27 23   --   --   --   GLUCOSE 100*  < > 85 110* 109*  --   --   --   BUN 9  < > 6 8 13 13 15 19   CREATININE 0.87  < > 0.71 0.86 0.86 0.8 0.8 0.8  CALCIUM 9.1  < > 8.8* 9.8 9.5  --   --   --   MG 2.0  --   --   --   --   --   --   --   < > = values in this interval not displayed.  Recent Labs  05/03/16 1554 08/12/16 1932 09/02/16  09/23/16  AST 23 25 18 21   ALT 23 16  21 18  ALKPHOS 51 45 43 47  BILITOT 1.0 0.6  --   --   PROT 5.9* 5.7*  --   --   ALBUMIN 3.6 3.4*  --   --     Recent Labs  05/03/16 1554  05/07/16 0301 05/10/16 1142 08/12/16 1932 09/02/16 09/23/16  WBC 8.3  < > 5.6 6.7 7.3 6.2 5.3  NEUTROABS 7.0  --   --   --  4.3  --   --   HGB 12.6  < > 11.4* 12.8 13.8 13.5 13.6  HCT 39.0  < > 34.5* 39.2 43.0 40 40  MCV 93.8  < > 93.5 93.6 94.7  --   --   PLT 219  < > 207 261 214 202 201  < > = values in this interval not displayed. Lab Results  Component Value Date   TSH 1.54 09/02/2016   Lab Results  Component Value Date   HGBA1C 5.7 09/02/2016   Lab Results  Component Value Date   CHOL 160 05/04/2016   HDL 66 05/04/2016   LDLCALC 81 05/04/2016   TRIG 64 05/04/2016   CHOLHDL 2.4 05/04/2016    Significant Diagnostic Results in last 30 days:  No results found.  Assessment/Plan Essential hypertension 10/14/16 Na 138, K 4.5, Bun 19, creat 0.77 Controlled, continue Diltiazem, Metoprolol, Enalapril   Atrial fibrillation (HCC) 10/14/16 Na 138, K 4.5, Bun 19, creat 0.77 Heat rate is in control, continue Metoprolol, Diltiazem, Xarelto   Chronic diastolic CHF (congestive heart failure), NYHA class 3 ([place]) Compensated clinically, noted BLE edema, improved, continue Furosemide 40mg , Kcl 40meq daily. BNP 306.5 09/03/16  Rheumatoid arthritis ([place]) Multiple sites, stable, continue Prednisone and prn Tylenol    Edema BLE, improved, continue Furosemide 40mg , 09/23/16 BNP 318. 10/14/16 Na 138, K 4.5, Bun 19, creat 0.77        Family/ staff Communication: AL  Labs/tests ordered:  pending

## 2016-11-11 NOTE — Assessment & Plan Note (Signed)
10/14/16 Na 138, K 4.5, Bun 19, creat 0.77 Controlled, continue Diltiazem, Metoprolol, Enalapril

## 2016-11-11 NOTE — Assessment & Plan Note (Signed)
Compensated clinically, noted BLE edema, improved, continue Furosemide 40mg , Kcl 76meq daily. BNP 306.5 09/03/16

## 2016-11-11 NOTE — Assessment & Plan Note (Signed)
Multiple sites, stable, continue Prednisone and prn Tylenol

## 2016-11-11 NOTE — Assessment & Plan Note (Addendum)
BLE, improved, continue Furosemide 40mg, 09/23/16 BNP 318. 10/14/16 Na 138, K 4.5, Bun 19, creat 0.77    

## 2016-11-17 ENCOUNTER — Other Ambulatory Visit: Payer: Self-pay

## 2016-11-18 ENCOUNTER — Ambulatory Visit (INDEPENDENT_AMBULATORY_CARE_PROVIDER_SITE_OTHER): Payer: PPO | Admitting: Orthopaedic Surgery

## 2016-11-23 ENCOUNTER — Ambulatory Visit: Payer: PPO | Admitting: Cardiology

## 2016-12-02 ENCOUNTER — Encounter: Payer: Self-pay | Admitting: Internal Medicine

## 2016-12-03 ENCOUNTER — Ambulatory Visit (INDEPENDENT_AMBULATORY_CARE_PROVIDER_SITE_OTHER): Payer: PPO | Admitting: Orthopaedic Surgery

## 2016-12-08 ENCOUNTER — Ambulatory Visit (INDEPENDENT_AMBULATORY_CARE_PROVIDER_SITE_OTHER): Payer: PPO | Admitting: Orthopedic Surgery

## 2016-12-22 ENCOUNTER — Ambulatory Visit (INDEPENDENT_AMBULATORY_CARE_PROVIDER_SITE_OTHER): Payer: PPO

## 2016-12-22 ENCOUNTER — Ambulatory Visit (INDEPENDENT_AMBULATORY_CARE_PROVIDER_SITE_OTHER): Payer: PPO | Admitting: Orthopedic Surgery

## 2016-12-22 ENCOUNTER — Encounter (INDEPENDENT_AMBULATORY_CARE_PROVIDER_SITE_OTHER): Payer: Self-pay | Admitting: Orthopedic Surgery

## 2016-12-22 VITALS — BP 130/70 | HR 76 | Resp 14 | Ht 62.0 in | Wt 150.0 lb

## 2016-12-22 DIAGNOSIS — M47812 Spondylosis without myelopathy or radiculopathy, cervical region: Secondary | ICD-10-CM

## 2016-12-22 DIAGNOSIS — M542 Cervicalgia: Secondary | ICD-10-CM

## 2016-12-22 MED ORDER — LIDOCAINE HCL 1 % IJ SOLN
2.0000 mL | INTRAMUSCULAR | Status: AC | PRN
  Administered 2016-12-22: 2 mL

## 2016-12-22 MED ORDER — METHYLPREDNISOLONE ACETATE 40 MG/ML IJ SUSP
40.0000 mg | INTRAMUSCULAR | Status: AC | PRN
  Administered 2016-12-22: 40 mg via INTRAMUSCULAR

## 2016-12-22 NOTE — Progress Notes (Signed)
Office Visit Note   Patient: [name]           Date of Birth: 1928/05/01           MRN: [id number] Visit Date: 12/22/2016              Requested by: Estill Dooms, MD [address] [place], [place] [address] PCP: Jeanmarie Hubert, MD   Assessment & Plan: Visit Diagnoses:  1. Trigger point of neck   2. Pain of cervical spine   3. Osteoarthritis of cervical spine, unspecified spinal osteoarthritis complication status     Plan:  #1: Trigger point injection to the right cervical spine(spoke with Dr. Durward Fortes who concurred with that). Had good relief post injection #2: Follow back up when necessary  Follow-Up Instructions: Return if symptoms worsen or fail to improve.   Orders:  Orders Placed This Encounter  Procedures  . XR Cervical Spine 2 or 3 views   No orders of the defined types were placed in this encounter.     Procedures: Trigger Point Inj Date/Time: 12/22/2016 5:12 PM Performed by: Biagio Borg D Authorized by: Biagio Borg D   Consent Given by:  Patient Timeout: prior to procedure the correct patient, procedure, and site was verified   Total # of Trigger Points:  1 Location: neck   Needle Size:  27 G Approach:  Lateral Medications #1:  2 mL lidocaine 1 %; 40 mg methylPREDNISolone acetate 40 MG/ML     Clinical Data: No additional findings.   Subjective: Chief Complaint  Patient presents with  . Neck - Pain  . Follow-up    Ms. Schuhmacher is 83 years old, accompanied by her daughter for evaluation of pain in the right side of her neck.  Ms. Nakanishi is presently living in assisted living at [facility] and in August 2017 had noted a rather acute onset of pain.  It was fairly localized along the right side of her neck with referred discomfort into the base of her skull, associated with some headaches.   She has been followed by Dr. Nelva Nay who obtained x-rays from [facility].  The study was performed 08/14/2016.Tthe report noted  per Dr Rudene Anda notes  that she has had a prior fusion from C3 to C6 and extensive degenerative changes between C7 and T1.  She also has severe degenerative disk disease in multiple facets.  At that time he essentially did a trigger point injection(according to her conversation that I had with him today) and she had relief until most recently. She comes in today requesting an injection.  Of interesting note Dr. Vertell Limber had performed the surgeries but she never did follow back up with him. Seen today for evaluation.    Review of Systems  Cardiovascular:       She has had A. fib and is presently on Xarelto 15 mg daily. Also has a history of hypertension.     Objective: Vital Signs: BP 130/70   Pulse 76   Resp 14   Ht 5\' 2"  (1.575 m)   Wt 150 lb (68 kg)   BMI 27.44 kg/m   Physical Exam  Constitutional: She is oriented to person, place, and time. She appears well-nourished.  HENT:  Head: Normocephalic.  Eyes: EOM are normal.  Pulmonary/Chest: Effort normal.  Neurological: She is alert and oriented to person, place, and time.  Skin: Skin is warm and dry.  Psychiatric: She has a normal mood and affect. Her behavior  is normal. Thought content normal.    Ortho Exam  Today she has limited range of motion in all planes of the cervical spine. 2 of right and left rotation. Backward extension not particular cause her pain. She can touch her chin to about 2 inches off of her chest.  Specialty Comments:  No specialty comments available.  Imaging: Xr Cervical Spine 2 Or 3 Views  Result Date: 12/22/2016 Two-view cervical spine reveals what appears to be a C3-4-5 arthrodesis with an anterior plate. She also has obliteration of the C5-C6 base with anterior spurring and probable autofusion. A 67 is similar. C7-T1 reveals possibly an anterior listhesis grade 1. Maintain posterior disc space. Spondylo-ecchymosis throughout the cervical spine.    PMFS History: Patient Active Problem List    Diagnosis Date Noted  . Left sided chest pain 09/29/2016  . Edema leg 09/22/2016  . Keratoacanthoma 08/12/2016  . Osteoarthritis 08/12/2016  . Edema 08/12/2016  . Cervicalgia 08/12/2016  . Chronic anticoagulation 07/29/2016  . Chronic diastolic CHF (congestive heart failure), NYHA class 3 ([place]) 06/17/2016  . Atrial fibrillation ([place]) 05/03/2016  . Abnormal chest CT 05/03/2016  . Acute respiratory failure with hypoxia ([place]) 05/03/2016  . Syncope 11/03/2014  . Essential hypertension 11/03/2014  . Rheumatoid arthritis ([place]) 11/03/2014   Past Medical History:  Diagnosis Date  . Cervicalgia 08/12/2016  . CHF (congestive heart failure) ([place])   . Chronic diastolic CHF (congestive heart failure), NYHA class 3 ([place]) 06/17/2016  . Complication of anesthesia   . Edema 08/12/2016  . Hypertension   . New onset atrial fibrillation ([place]) 04/2016   a. started on Xarelto and Cardizem  . PONV (postoperative nausea and vomiting)   . RA (rheumatoid arthritis) ([place])   . RA (rheumatoid arthritis) ([place])   . SOB (shortness of breath) 04/2016   Hospitalist  . Vertigo     Family History  Problem Relation Age of Onset  . Cancer Sister     brain  . Arthritis Daughter   . Hypothyroidism Daughter   . Hypertension Son   . Arthritis Son     Past Surgical History:  Procedure Laterality Date  . CARDIOVERSION N/A 07/08/2016   Procedure: CARDIOVERSION;  Surgeon: Larey Dresser, MD;  Location: [facility];  Service: Cardiovascular;  Laterality: N/A;  . CERVICAL SPINE SURGERY  2005   Dr. Vertell Limber  . HYSTEROSCOPY  2001  . JOINT REPLACEMENT    . REPLACEMENT TOTAL KNEE Right 1996   [place] Bilateral [id number]   Baird Lyons MD   Social History   Occupational History  . Retired    Social History Main Topics  . Smoking status: Former Smoker    Quit date: 12/13/1966  . Smokeless tobacco: Never Used  . Alcohol use No  . Drug use: No  . Sexual activity: No

## 2016-12-27 ENCOUNTER — Non-Acute Institutional Stay: Payer: PPO | Admitting: Internal Medicine

## 2016-12-27 ENCOUNTER — Encounter: Payer: Self-pay | Admitting: Internal Medicine

## 2016-12-27 DIAGNOSIS — I480 Paroxysmal atrial fibrillation: Secondary | ICD-10-CM | POA: Diagnosis not present

## 2016-12-27 DIAGNOSIS — E785 Hyperlipidemia, unspecified: Secondary | ICD-10-CM

## 2016-12-27 DIAGNOSIS — J22 Unspecified acute lower respiratory infection: Secondary | ICD-10-CM

## 2016-12-27 DIAGNOSIS — I5032 Chronic diastolic (congestive) heart failure: Secondary | ICD-10-CM

## 2016-12-27 DIAGNOSIS — M542 Cervicalgia: Secondary | ICD-10-CM | POA: Diagnosis not present

## 2016-12-27 DIAGNOSIS — Z7901 Long term (current) use of anticoagulants: Secondary | ICD-10-CM

## 2016-12-27 DIAGNOSIS — L858 Other specified epidermal thickening: Secondary | ICD-10-CM | POA: Diagnosis not present

## 2016-12-27 DIAGNOSIS — I10 Essential (primary) hypertension: Secondary | ICD-10-CM

## 2016-12-27 MED ORDER — DM-GUAIFENESIN ER 30-600 MG PO TB12: ORAL_TABLET | ORAL | 1 refills | Status: DC

## 2016-12-27 NOTE — Progress Notes (Signed)
Progress Note    Location:  [facility] Room Number: AL 918 Place of Service:  ALF [phone]) Provider:  Jeanmarie Hubert, MD  Patient Care Team: Estill Dooms, MD as PCP - General (Internal Medicine) Man Otho Darner, NP as Nurse Practitioner (Internal Medicine) Peter M Martinique, MD as Consulting Physician (Cardiology) Lavonna Monarch, MD as Consulting Physician (Dermatology) Garald Balding, MD as Consulting Physician (Orthopedic Surgery)  Extended Emergency Contact Information Primary Emergency Contact: Berneta Levins Address: [address] [place], [place] [address] Johnnette Litter of [place] Phone: [phone] Work Phone: [phone] Mobile Phone: [phone] Relation: Daughter Secondary Emergency Contact: Azalee Course [place] of [place] Phone: [phone] Mobile Phone: [phone] Relation: Son  Code Status:  DNR Goals of care: Advanced Directive information Advanced Directives 12/27/2016  Does Patient Have a Medical Advance Directive? Yes  Type of Advance Directive Living will;Out of facility DNR (pink MOST or yellow form)  Does patient want to make changes to medical advance directive? -  Copy of [place] in Chart? -  Would patient like information on creating a medical advance directive? -  Pre-existing out of facility DNR order (yellow form or pink MOST form) Yellow form placed in chart (order not valid for inpatient use)     Chief Complaint  Patient presents with  . Medical Management of Chronic Issues    routine visit    HPI:  Pt is a 83 y.o. female seen today for medical management of chronic diseases.    ARI (acute respiratory infection) - more or less chronic cough and congestion in the chest and upper respiratory tract. Worse in the last month per patient.  Keratoacanthoma - removed nby Dr. Syble Creek on 09/15/16  Essential hypertension - controlled  Chronic diastolic CHF (congestive heart  failure), NYHA class 3 ([place]) - compensated  Paroxysmal atrial fibrillation ([place]) - seems to be in NSR today  Chronic anticoagulation - Eliquis for PAF  Cervicalgia - chronic. Had trigger point injection at Dr. Rudene Anda office 12/22/16. Seems to be helping.  Hyperlipidemia, unspecified hyperlipidemia type     Past Medical History:  Diagnosis Date  . Cervicalgia 08/12/2016  . CHF (congestive heart failure) ([place])   . Chronic diastolic CHF (congestive heart failure), NYHA class 3 ([place]) 06/17/2016  . Complication of anesthesia   . Edema 08/12/2016  . Hypertension   . New onset atrial fibrillation ([place]) 04/2016   a. started on Xarelto and Cardizem  . PONV (postoperative nausea and vomiting)   . RA (rheumatoid arthritis) ([place])   . RA (rheumatoid arthritis) ([place])   . SOB (shortness of breath) 04/2016   Hospitalist  . Vertigo    Past Surgical History:  Procedure Laterality Date  . CARDIOVERSION N/A 07/08/2016   Procedure: CARDIOVERSION;  Surgeon: Larey Dresser, MD;  Location: [facility];  Service: Cardiovascular;  Laterality: N/A;  . CERVICAL SPINE SURGERY  2005   Dr. Vertell Limber  . HYSTEROSCOPY  2001  . JOINT REPLACEMENT    . REPLACEMENT TOTAL KNEE Right 1996   [place] Bilateral [id number]   Baird Lyons MD    Allergies  Allergen Reactions  . Morphine Sulfate Other (See Comments)    "extremely nauseated"  . Other Nausea And Vomiting    Pt states she is very sensitive to pain medications.    Allergies as of 12/27/2016      Reactions  Morphine Sulfate Other (See Comments)   "extremely nauseated"   Other Nausea And Vomiting   Pt states she is very sensitive to pain medications.      Medication List       Accurate as of 12/27/16 12:37 PM. Always use your most recent med list.          acetaminophen 325 MG tablet Commonly known as:  TYLENOL Take 650 mg by mouth every 6 (six) hours as needed for mild pain.   amoxicillin 500 MG  capsule Commonly known as:  AMOXIL Take 2,000 mg by mouth See admin instructions. 1 hour prior to dental procedures   BENEFIBER PO Take 4 scoop by mouth daily.   CALCIUM 600 PO Take 600-900 mg by mouth 2 (two) times daily. Take one tablet 600 mg in morning, take 1 1/2 tablets in evening for calcium   CLARITIN 10 MG tablet Generic drug:  loratadine Take 10 mg by mouth daily.   diltiazem 120 MG 24 hr capsule Commonly known as:  CARDIZEM CD Take 1 capsule (120 mg total) by mouth daily.   docusate sodium 100 MG capsule Commonly known as:  COLACE Take 200 mg by mouth every evening.   enalapril 20 MG tablet Commonly known as:  VASOTEC Take 20 mg by mouth 2 (two) times daily.   furosemide 40 MG tablet Commonly known as:  LASIX Take one tablet once a day   Magnesium 250 MG Tabs Take 250 mg by mouth daily.   metoprolol succinate 50 MG 24 hr tablet Commonly known as:  TOPROL-XL Take 50 mg by mouth. Take one once a day   metoprolol succinate 100 MG 24 hr tablet Commonly known as:  TOPROL-XL Take one tablet daily for blood pressure   mirtazapine 15 MG tablet Commonly known as:  REMERON Take one tablet at bedtime   OMEGA 3 PO Take 1 tablet by mouth daily.   OXYGEN Inhale 2 L into the lungs daily as needed (shortness of breath). Use 2 liter as needed for SOB, 02 sat < 90% on room air   PENNSAID 2 % Soln Generic drug:  Diclofenac Sodium Place onto the skin. Apply sparingly topical to right side of neck as needed twice a day   potassium chloride SA 20 MEQ tablet Commonly known as:  K-DUR,KLOR-CON Take 20 mEq by mouth. Take on table once a day for potassium   predniSONE 1 MG tablet Commonly known as:  DELTASONE Take 4 mg by mouth daily with breakfast.   Rivaroxaban 15 MG Tabs tablet Commonly known as:  XARELTO Take 1 tablet (15 mg total) by mouth daily with supper.   vitamin C 500 MG tablet Commonly known as:  ASCORBIC ACID Take 500 mg by mouth daily.        Review of Systems  Constitutional: Negative for activity change, appetite change, chills, diaphoresis, fatigue, fever and unexpected weight change.  HENT: Positive for hearing loss. Negative for congestion, ear discharge, ear pain, postnasal drip, rhinorrhea, sore throat, tinnitus, trouble swallowing and voice change.        Xerostomia  Eyes: Negative for pain, redness, itching and visual disturbance.  Respiratory: Negative for cough, choking, shortness of breath and wheezing.        Resolved left sided pain with deep breath 10/17/17w/o intervention,  lasted several hours.   Cardiovascular: Positive for leg swelling. Negative for chest pain and palpitations.       AF. HTN. HX CHF. Cardioversion 07/08/16 by Dr. Aundra Dubin.  Gastrointestinal: Negative for abdominal distention, abdominal pain, constipation, diarrhea and nausea.       Loss of appetite. Hx hemorrhoids.  Endocrine: Negative for cold intolerance, heat intolerance, polydipsia, polyphagia and polyuria.  Genitourinary: Negative for difficulty urinating, dysuria, flank pain, frequency, hematuria, pelvic pain, urgency and vaginal discharge.  Musculoskeletal: Positive for gait problem (using walker). Negative for arthralgias, back pain, myalgias, neck pain and neck stiffness.       HX RA and OA. OP, senile. Deconditioning and generalized weakness.  Patient 2 left shoulder replacements and 1 right shoulder replacement. Also had a rightt knee replacement. Neck pain in the right posterior neck responding to trigger point injection done 12/22/16. Prior fusion about C3-4-5.  Skin: Negative for color change, pallor and rash.       Forearm for about 2 months. Patient relates to cut, but it appears more treated keratoacanthoma and possible skin cancer.  Allergic/Immunologic: Negative.   Neurological: Positive for weakness (generalized). Negative for dizziness, tremors, seizures, syncope, numbness and headaches.       Paresthesias. Hx syncope.   Hematological: Negative for adenopathy. Bruises/bleeds easily.  Psychiatric/Behavioral: Negative for agitation, behavioral problems, confusion, dysphoric mood, hallucinations, sleep disturbance and suicidal ideas. The patient is not nervous/anxious and is not hyperactive.     Immunization History  Administered Date(s) Administered  . Influenza-Unspecified 09/13/2015, 09/29/2016  . Pneumococcal-Unspecified 05/14/2015  . Tdap 04/13/2011   Pertinent  Health Maintenance Due  Topic Date Due  . PNA vac Low Risk Adult (2 of 2 - PCV13) 05/13/2016  . INFLUENZA VACCINE  Completed  . DEXA SCAN  Completed   Fall Risk  08/12/2016  Falls in the past year? No   Functional Status Survey:    Vitals:   12/27/16 1223  BP: (!) 142/80  Pulse: 74  Resp: 20  Temp: 98.6 F (37 C)  Weight: 147 lb (66.7 kg)  Height: 5\' 1"  (1.549 m)   Body mass index is 27.78 kg/m. Physical Exam  Constitutional: She is oriented to person, place, and time. She appears well-nourished. No distress.  Thin and frail  HENT:  Right Ear: External ear normal.  Left Ear: External ear normal.  Nose: Nose normal.  Mouth/Throat: Oropharynx is clear and moist. No oropharyngeal exudate.  Eyes: Conjunctivae and EOM are normal. Pupils are equal, round, and reactive to light. No scleral icterus.  Prescription lenses  Neck: No JVD present. No tracheal deviation present. No thyromegaly present.  Cardiovascular: Exam reveals no gallop and no friction rub.   No murmur heard. Atrial fibrillation, heart rate is in control  Pulmonary/Chest: Effort normal. No respiratory distress. She has no wheezes. She has no rales. She exhibits no tenderness.  Abdominal: She exhibits no distension and no mass. There is no tenderness.  Musculoskeletal: Normal range of motion. She exhibits edema. She exhibits no tenderness.  Trace edema BLE  Lymphadenopathy:    She has no cervical adenopathy.  Neurological: She is alert and oriented to person,  place, and time. No cranial nerve deficit. Coordination normal.  Skin: No rash noted. She is not diaphoretic. No erythema. No pallor.  Keratoacanthoma of the left forearm dorsal aspect has been excised.  Psychiatric: She has a normal mood and affect. Her behavior is normal. Judgment and thought content normal.    Labs reviewed:  Recent Labs  05/05/16 0444  05/07/16 0301 05/10/16 1142 08/12/16 1932  09/23/16 10/14/16 11/11/16  NA 134*  < > 134* 136 133*  < > 138 138 137  K  3.7  < > 3.9 3.8 3.7  < > 4.3 4.5 4.3  CL 97*  < > 100* 99* 98*  --   --   --   --   CO2 28  < > 26 27 23   --   --   --   --   GLUCOSE 100*  < > 85 110* 109*  --   --   --   --   BUN 9  < > 6 8 13   < > 15 19 22*  CREATININE 0.87  < > 0.71 0.86 0.86  < > 0.8 0.8 1.0  CALCIUM 9.1  < > 8.8* 9.8 9.5  --   --   --   --   MG 2.0  --   --   --   --   --   --   --   --   < > = values in this interval not displayed.  Recent Labs  05/03/16 1554 08/12/16 1932 09/02/16 09/23/16 11/11/16  AST 23 25 18 21 17   ALT 23 16 21 18 10   ALKPHOS 51 45 43 47 51  BILITOT 1.0 0.6  --   --   --   PROT 5.9* 5.7*  --   --   --   ALBUMIN 3.6 3.4*  --   --   --     Recent Labs  05/03/16 1554  05/07/16 0301 05/10/16 1142 08/12/16 1932 09/02/16 09/23/16  WBC 8.3  < > 5.6 6.7 7.3 6.2 5.3  NEUTROABS 7.0  --   --   --  4.3  --   --   HGB 12.6  < > 11.4* 12.8 13.8 13.5 13.6  HCT 39.0  < > 34.5* 39.2 43.0 40 40  MCV 93.8  < > 93.5 93.6 94.7  --   --   PLT 219  < > 207 261 214 202 201  < > = values in this interval not displayed. Lab Results  Component Value Date   TSH 2.12 11/11/2016   Lab Results  Component Value Date   HGBA1C 5.7 09/02/2016   Lab Results  Component Value Date   CHOL 141 11/11/2016   HDL 74 (A) 11/11/2016   LDLCALC 50 11/11/2016   TRIG 87 11/11/2016   CHOLHDL 2.4 05/04/2016    Significant Diagnostic Results in last 30 days:  Xr Cervical Spine 2 Or 3 Views  Result Date: 12/22/2016 Two-view cervical  spine reveals what appears to be a C3-4-5 arthrodesis with an anterior plate. She also has obliteration of the C5-C6 base with anterior spurring and probable autofusion. A 67 is similar. C7-T1 reveals possibly an anterior listhesis grade 1. Maintain posterior disc space. Spondylo-ecchymosis throughout the cervical spine.   Assessment/Plan 1. ARI (acute respiratory infection) - dextromethorphan-guaiFENesin (MUCINEX DM) 30-600 MG 12hr tablet; One twice daily to suppress cough and congestion  Dispense: 20 tablet; Refill: 1  2. Keratoacanthoma Removed 09/15/16  3. Essential hypertension The current medical regimen is effective;  continue present plan and medications.  4. Chronic diastolic CHF (congestive heart failure), NYHA class 3 (HCC) The current medical regimen is effective;  continue present plan and medications.  5. Paroxysmal atrial fibrillation ([facility]) Currently in NSR  6. Chronic anticoagulation The current medical regimen is effective;  continue present plan and medications.  7. Cervicalgia improved  8. Hyperlipidemia, unspecified hyperlipidemia type Stopped Omega 3 fish oil. The last lipid panel in Oct 2017 was normal including triglycerides of 87.

## 2017-01-13 ENCOUNTER — Encounter: Payer: Self-pay | Admitting: Internal Medicine

## 2017-01-26 ENCOUNTER — Non-Acute Institutional Stay: Payer: PPO | Admitting: Nurse Practitioner

## 2017-01-26 ENCOUNTER — Encounter: Payer: Self-pay | Admitting: Nurse Practitioner

## 2017-01-26 DIAGNOSIS — R600 Localized edema: Secondary | ICD-10-CM | POA: Diagnosis not present

## 2017-01-26 DIAGNOSIS — I10 Essential (primary) hypertension: Secondary | ICD-10-CM

## 2017-01-26 DIAGNOSIS — L989 Disorder of the skin and subcutaneous tissue, unspecified: Secondary | ICD-10-CM | POA: Insufficient documentation

## 2017-01-26 DIAGNOSIS — I5032 Chronic diastolic (congestive) heart failure: Secondary | ICD-10-CM | POA: Diagnosis not present

## 2017-01-26 DIAGNOSIS — Z7901 Long term (current) use of anticoagulants: Secondary | ICD-10-CM

## 2017-01-26 DIAGNOSIS — I480 Paroxysmal atrial fibrillation: Secondary | ICD-10-CM

## 2017-01-26 DIAGNOSIS — M069 Rheumatoid arthritis, unspecified: Secondary | ICD-10-CM | POA: Diagnosis not present

## 2017-01-26 DIAGNOSIS — F418 Other specified anxiety disorders: Secondary | ICD-10-CM | POA: Insufficient documentation

## 2017-01-26 DIAGNOSIS — S8011XA Contusion of right lower leg, initial encounter: Secondary | ICD-10-CM | POA: Diagnosis not present

## 2017-01-26 NOTE — Assessment & Plan Note (Signed)
Multiple sites, stable, continue Prednisone 4mg qd and prn Tylenol 

## 2017-01-26 NOTE — Assessment & Plan Note (Signed)
10/14/16 Na 138, K 4.5, Bun 19, creat 0.77 Heat rate is in control, continue Metoprolol 100mg qd, Diltiazem 120mg qd, Xarelto 15mg qd  

## 2017-01-26 NOTE — Assessment & Plan Note (Signed)
Lateral right lower leg size of a quarter tender hematoma like lesion, no s/s of infection.  Warm compress 4x/day x 2 weeks. Observe.

## 2017-01-26 NOTE — Assessment & Plan Note (Signed)
For Afib, continue Xarelto.  

## 2017-01-26 NOTE — Assessment & Plan Note (Signed)
10/14/16 Na 138, K 4.5, Bun 19, creat 0.77 Controlled, continue Diltiazem 120mg qd, Metoprolol 100mg qd, Enalapril 20mg bid  

## 2017-01-26 NOTE — Assessment & Plan Note (Signed)
Mood is stable, continue Mirtazapine 15mg qd 

## 2017-01-26 NOTE — Progress Notes (Signed)
Location:  [place] Room Number: [id number] Place of Service:  ALF [phone]) Provider:  Mast, Manxie  NP  Jeanmarie Hubert, MD  Patient Care Team: Estill Dooms, MD as PCP - General (Internal Medicine) Man Otho Darner, NP as Nurse Practitioner (Internal Medicine) Peter M Martinique, MD as Consulting Physician (Cardiology) Lavonna Monarch, MD as Consulting Physician (Dermatology) Garald Balding, MD as Consulting Physician (Orthopedic Surgery)  Extended Emergency Contact Information Primary Emergency Contact: Berneta Levins Address: [address] [place], [place] [address] Johnnette Litter of [place] Phone: [phone] Work Phone: [phone] Mobile Phone: [phone] Relation: Daughter Secondary Emergency Contact: Azalee Course [place] of [place] Phone: [phone] Mobile Phone: [phone] Relation: Son  Code Status:  DNR Goals of care: Advanced Directive information Advanced Directives 01/26/2017  Does Patient Have a Medical Advance Directive? Yes  Type of Advance Directive Living will;Out of facility DNR (pink MOST or yellow form)  Does patient want to make changes to medical advance directive? No - Patient declined  Copy of [place] in Chart? -  Would patient like information on creating a medical advance directive? -  Pre-existing out of facility DNR order (yellow form or pink MOST form) Yellow form placed in chart (order not valid for inpatient use)     Chief Complaint  Patient presents with  . Medical Management of Chronic Issues    RLE-red, dry and scaly area. Sore to touch    HPI:  Pt is a 83 y.o. female seen today for medical management of chronic diseases.    Lateral right lower leg size of a quarter tender hematoma like lesion, no s/s of infection.   Hx Afib, EKG 08/31/16 at [facility] AFib, taking Xareltp 15mg  daily, heart rate is in control while on Diltiazem 120mg , Metoprolol 100mg  daily, Enalapril 20mg  bid. HTN  blood pressure is controlled. Edema, BLE, trace, Furosemide 40mg  daily. Mood is stable, taking Mirtazapine 15mg  daily. RA, managed with Prednisone 4mg  daily.    Past Medical History:  Diagnosis Date  . Cervicalgia 08/12/2016  . CHF (congestive heart failure) ([place])   . Chronic diastolic CHF (congestive heart failure), NYHA class 3 ([place]) 06/17/2016  . Complication of anesthesia   . Edema 08/12/2016  . Hypertension   . New onset atrial fibrillation ([place]) 04/2016   a. started on Xarelto and Cardizem  . PONV (postoperative nausea and vomiting)   . RA (rheumatoid arthritis) ([place])   . RA (rheumatoid arthritis) ([place])   . SOB (shortness of breath) 04/2016   Hospitalist  . Vertigo    Past Surgical History:  Procedure Laterality Date  . CARDIOVERSION N/A 07/08/2016   Procedure: CARDIOVERSION;  Surgeon: Larey Dresser, MD;  Location: [facility];  Service: Cardiovascular;  Laterality: N/A;  . CERVICAL SPINE SURGERY  2005   Dr. Vertell Limber  . HYSTEROSCOPY  2001  . JOINT REPLACEMENT    . REPLACEMENT TOTAL KNEE Right 1996   [place] Bilateral [id number]   Baird Lyons MD    Allergies  Allergen Reactions  . Morphine Sulfate Other (See Comments)    "extremely nauseated"  . Other Nausea And Vomiting    Pt states she is very sensitive to pain medications.    Allergies as of 01/26/2017      Reactions   Morphine Sulfate Other (See Comments)   "extremely nauseated"   Other Nausea And Vomiting   Pt states she  is very sensitive to pain medications.      Medication List       Accurate as of 01/26/17  1:39 PM. Always use your most recent med list.          acetaminophen 325 MG tablet Commonly known as:  TYLENOL Take 650 mg by mouth every 6 (six) hours as needed for mild pain.   amoxicillin 500 MG capsule Commonly known as:  AMOXIL Take 2,000 mg by mouth See admin instructions. 1 hour prior to dental procedures   BENEFIBER PO Take 4 scoop by mouth  daily.   CALCIUM 600 PO Take 600-900 mg by mouth 2 (two) times daily. Take one tablet 600 mg in morning, take 1 1/2 tablets in evening for calcium   CLARITIN 10 MG tablet Generic drug:  loratadine Take 10 mg by mouth daily.   dextromethorphan-guaiFENesin 30-600 MG 12hr tablet Commonly known as:  MUCINEX DM One twice daily to suppress cough and congestion   diltiazem 120 MG 24 hr capsule Commonly known as:  CARDIZEM CD Take 1 capsule (120 mg total) by mouth daily.   docusate sodium 100 MG capsule Commonly known as:  COLACE Take 200 mg by mouth every evening.   enalapril 20 MG tablet Commonly known as:  VASOTEC Take 20 mg by mouth 2 (two) times daily.   furosemide 40 MG tablet Commonly known as:  LASIX Take one tablet once a day   Magnesium 250 MG Tabs Take 250 mg by mouth daily.   metoprolol succinate 50 MG 24 hr tablet Commonly known as:  TOPROL-XL Take 50 mg by mouth. Take one once a day   metoprolol succinate 100 MG 24 hr tablet Commonly known as:  TOPROL-XL Take one tablet daily for blood pressure   mirtazapine 15 MG tablet Commonly known as:  REMERON Take one tablet at bedtime   OXYGEN Inhale 2 L into the lungs daily as needed (shortness of breath). Use 2 liter as needed for SOB, 02 sat < 90% on room air   PENNSAID 2 % Soln Generic drug:  Diclofenac Sodium Place onto the skin. Apply sparingly topical to right side of neck as needed twice a day   potassium chloride SA 20 MEQ tablet Commonly known as:  K-DUR,KLOR-CON Take 20 mEq by mouth. Take on table once a day for potassium   predniSONE 1 MG tablet Commonly known as:  DELTASONE Take 4 mg by mouth daily with breakfast.   Rivaroxaban 15 MG Tabs tablet Commonly known as:  XARELTO Take 1 tablet (15 mg total) by mouth daily with supper.   vitamin C 500 MG tablet Commonly known as:  ASCORBIC ACID Take 500 mg by mouth daily.       Review of Systems  Constitutional: Negative for activity change,  appetite change, chills, diaphoresis, fatigue, fever and unexpected weight change.  HENT: Positive for hearing loss. Negative for congestion, ear discharge, ear pain, postnasal drip, rhinorrhea, sore throat, tinnitus, trouble swallowing and voice change.        Xerostomia  Eyes: Negative for pain, redness, itching and visual disturbance.  Respiratory: Negative for cough, choking, shortness of breath and wheezing.        Resolved left sided pain with deep breath 10/17/17w/o intervention,  lasted several hours.   Cardiovascular: Positive for leg swelling. Negative for chest pain and palpitations.       AF. HTN. HX CHF. Cardioversion 07/08/16 by Dr. Aundra Dubin.  Gastrointestinal: Negative for abdominal distention, abdominal pain, constipation, diarrhea  and nausea.       Loss of appetite. Hx hemorrhoids.  Endocrine: Negative for cold intolerance, heat intolerance, polydipsia, polyphagia and polyuria.  Genitourinary: Negative for difficulty urinating, dysuria, flank pain, frequency, hematuria, pelvic pain, urgency and vaginal discharge.  Musculoskeletal: Positive for gait problem (using walker). Negative for arthralgias, back pain, myalgias, neck pain and neck stiffness.       HX RA and OA. OP, senile. Deconditioning and generalized weakness.  Patient 2 left shoulder replacements and 1 right shoulder replacement. Also had a rightt knee replacement. Neck pain in the right posterior neck responding to trigger point injection done 12/22/16. Prior fusion about C3-4-5.  Skin: Negative for color change, pallor and rash.       Forearm for about 2 months. Patient relates to cut, but it appears more treated keratoacanthoma and possible skin cancer.  Allergic/Immunologic: Negative.   Neurological: Positive for weakness (generalized). Negative for dizziness, tremors, seizures, syncope, numbness and headaches.       Paresthesias. Hx syncope.  Hematological: Negative for adenopathy. Bruises/bleeds easily.   Psychiatric/Behavioral: Negative for agitation, behavioral problems, confusion, dysphoric mood, hallucinations, sleep disturbance and suicidal ideas. The patient is not nervous/anxious and is not hyperactive.     Immunization History  Administered Date(s) Administered  . Influenza-Unspecified 09/13/2015, 09/29/2016  . Pneumococcal-Unspecified 05/14/2015  . Tdap 04/13/2011   Pertinent  Health Maintenance Due  Topic Date Due  . PNA vac Low Risk Adult (2 of 2 - PCV13) 05/13/2016  . INFLUENZA VACCINE  Completed  . DEXA SCAN  Completed   Fall Risk  08/12/2016  Falls in the past year? No   Functional Status Survey:    Vitals:   01/26/17 0957  BP: 136/82  Pulse: 76  Resp: 18  Temp: 97.6 F (36.4 C)  Weight: 151 lb 12.8 oz (68.9 kg)  Height: 5\' 1"  (1.549 m)   Body mass index is 28.68 kg/m. Physical Exam  Constitutional: She is oriented to person, place, and time. She appears well-nourished. No distress.  Thin and frail  HENT:  Right Ear: External ear normal.  Left Ear: External ear normal.  Nose: Nose normal.  Mouth/Throat: Oropharynx is clear and moist. No oropharyngeal exudate.  Eyes: Conjunctivae and EOM are normal. Pupils are equal, round, and reactive to light. No scleral icterus.  Prescription lenses  Neck: No JVD present. No tracheal deviation present. No thyromegaly present.  Cardiovascular: Exam reveals no gallop and no friction rub.   No murmur heard. Atrial fibrillation, heart rate is in control  Pulmonary/Chest: Effort normal. No respiratory distress. She has no wheezes. She has no rales. She exhibits no tenderness.  Abdominal: She exhibits no distension and no mass. There is no tenderness.  Musculoskeletal: Normal range of motion. She exhibits edema. She exhibits no tenderness.  Trace edema BLE  Lymphadenopathy:    She has no cervical adenopathy.  Neurological: She is alert and oriented to person, place, and time. No cranial nerve deficit. Coordination  normal.  Skin: No rash noted. She is not diaphoretic. No erythema. No pallor.  Keratoacanthoma of the left forearm dorsal aspect has been excised. Lateral right lower leg size of a quarter tender hematoma like lesion, no s/s of infection.   Psychiatric: She has a normal mood and affect. Her behavior is normal. Judgment and thought content normal.    Labs reviewed:  Recent Labs  05/05/16 0444  05/07/16 0301 05/10/16 1142 08/12/16 1932  09/23/16 10/14/16 11/11/16  NA 134*  < > 134* 136  133*  < > 138 138 137  K 3.7  < > 3.9 3.8 3.7  < > 4.3 4.5 4.3  CL 97*  < > 100* 99* 98*  --   --   --   --   CO2 28  < > 26 27 23   --   --   --   --   GLUCOSE 100*  < > 85 110* 109*  --   --   --   --   BUN 9  < > 6 8 13   < > 15 19 22*  CREATININE 0.87  < > 0.71 0.86 0.86  < > 0.8 0.8 1.0  CALCIUM 9.1  < > 8.8* 9.8 9.5  --   --   --   --   MG 2.0  --   --   --   --   --   --   --   --   < > = values in this interval not displayed.  Recent Labs  05/03/16 1554 08/12/16 1932 09/02/16 09/23/16 11/11/16  AST 23 25 18 21 17   ALT 23 16 21 18 10   ALKPHOS 51 45 43 47 51  BILITOT 1.0 0.6  --   --   --   PROT 5.9* 5.7*  --   --   --   ALBUMIN 3.6 3.4*  --   --   --     Recent Labs  05/03/16 1554  05/07/16 0301 05/10/16 1142 08/12/16 1932 09/02/16 09/23/16  WBC 8.3  < > 5.6 6.7 7.3 6.2 5.3  NEUTROABS 7.0  --   --   --  4.3  --   --   HGB 12.6  < > 11.4* 12.8 13.8 13.5 13.6  HCT 39.0  < > 34.5* 39.2 43.0 40 40  MCV 93.8  < > 93.5 93.6 94.7  --   --   PLT 219  < > 207 261 214 202 201  < > = values in this interval not displayed. Lab Results  Component Value Date   TSH 2.12 11/11/2016   Lab Results  Component Value Date   HGBA1C 5.7 09/02/2016   Lab Results  Component Value Date   CHOL 141 11/11/2016   HDL 74 (A) 11/11/2016   LDLCALC 50 11/11/2016   TRIG 87 11/11/2016   CHOLHDL 2.4 05/04/2016    Significant Diagnostic Results in last 30 days:  No results  found.  Assessment/Plan Traumatic hematoma of right lower leg Lateral right lower leg size of a quarter tender hematoma like lesion, no s/s of infection.  Warm compress 4x/day x 2 weeks. Observe.   Essential hypertension 10/14/16 Na 138, K 4.5, Bun 19, creat 0.77 Controlled, continue Diltiazem 120mg  qd, Metoprolol 100mg  qd, Enalapril 20mg  bid   Atrial fibrillation (HCC) 10/14/16 Na 138, K 4.5, Bun 19, creat 0.77 Heat rate is in control, continue Metoprolol 100mg  qd, Diltiazem 120mg  qd, Xarelto 15mg  qd   Chronic diastolic CHF (congestive heart failure), NYHA class 3 (HCC) Last creat 0.97 11/11/16, BNP 306.5 09/03/16, compensated, continue Furosemide 40mg  qd  Rheumatoid arthritis (HCC) Multiple sites, stable, continue Prednisone 4mg  qd and prn Tylenol  Chronic anticoagulation For Afib, continue Xarelto.   Edema leg Multiple sites, stable, continue Prednisone and prn Tylenol  Depression with anxiety Mood is stable, continue Mirtazapine 15mg  qd     Family/ staff Communication:AL  Labs/tests ordered: none

## 2017-01-26 NOTE — Assessment & Plan Note (Addendum)
Last creat 0.97 11/11/16, BNP 306.5 09/03/16, compensated, continue Furosemide 40mg qd 

## 2017-01-26 NOTE — Assessment & Plan Note (Signed)
Multiple sites, stable, continue Prednisone and prn Tylenol

## 2017-02-03 ENCOUNTER — Encounter: Payer: Self-pay | Admitting: Nurse Practitioner

## 2017-02-03 NOTE — Progress Notes (Signed)
This encounter was created in error - please disregard.

## 2017-02-09 ENCOUNTER — Encounter: Payer: Self-pay | Admitting: Nurse Practitioner

## 2017-02-23 DIAGNOSIS — L570 Actinic keratosis: Secondary | ICD-10-CM | POA: Diagnosis not present

## 2017-02-23 DIAGNOSIS — L814 Other melanin hyperpigmentation: Secondary | ICD-10-CM | POA: Diagnosis not present

## 2017-02-23 DIAGNOSIS — D485 Neoplasm of uncertain behavior of skin: Secondary | ICD-10-CM | POA: Diagnosis not present

## 2017-02-23 DIAGNOSIS — C44722 Squamous cell carcinoma of skin of right lower limb, including hip: Secondary | ICD-10-CM | POA: Diagnosis not present

## 2017-03-03 ENCOUNTER — Non-Acute Institutional Stay: Payer: PPO | Admitting: Nurse Practitioner

## 2017-03-03 DIAGNOSIS — F418 Other specified anxiety disorders: Secondary | ICD-10-CM

## 2017-03-03 DIAGNOSIS — R600 Localized edema: Secondary | ICD-10-CM | POA: Diagnosis not present

## 2017-03-03 DIAGNOSIS — Z7901 Long term (current) use of anticoagulants: Secondary | ICD-10-CM | POA: Diagnosis not present

## 2017-03-03 DIAGNOSIS — K5900 Constipation, unspecified: Secondary | ICD-10-CM | POA: Insufficient documentation

## 2017-03-03 DIAGNOSIS — I5032 Chronic diastolic (congestive) heart failure: Secondary | ICD-10-CM

## 2017-03-03 DIAGNOSIS — L989 Disorder of the skin and subcutaneous tissue, unspecified: Secondary | ICD-10-CM | POA: Diagnosis not present

## 2017-03-03 DIAGNOSIS — I480 Paroxysmal atrial fibrillation: Secondary | ICD-10-CM

## 2017-03-03 DIAGNOSIS — I10 Essential (primary) hypertension: Secondary | ICD-10-CM | POA: Diagnosis not present

## 2017-03-03 DIAGNOSIS — J9601 Acute respiratory failure with hypoxia: Secondary | ICD-10-CM

## 2017-03-03 DIAGNOSIS — M069 Rheumatoid arthritis, unspecified: Secondary | ICD-10-CM

## 2017-03-03 NOTE — Assessment & Plan Note (Signed)
10/14/16 Na 138, K 4.5, Bun 19, creat 0.77 Controlled, continue Diltiazem 120mg  qd, Metoprolol 100mg  qd, Enalapril 20mg  bid

## 2017-03-03 NOTE — Assessment & Plan Note (Signed)
Lateral right lower leg, non healing, no s/s of infection, s/p dermatology, pending biopsy

## 2017-03-03 NOTE — Assessment & Plan Note (Signed)
BLE, improved, continue Furosemide 40mg , 09/23/16 BNP 318. 10/14/16 Na 138, K 4.5, Bun 19, creat 0.77

## 2017-03-03 NOTE — Assessment & Plan Note (Signed)
For Afib, continue Xarelto.

## 2017-03-03 NOTE — Assessment & Plan Note (Signed)
03/03/17 pharm Mucinex DM q12h prn.

## 2017-03-03 NOTE — Assessment & Plan Note (Signed)
Last creat 0.97 11/11/16, BNP 306.5 09/03/16, compensated, continue Furosemide 40mg  qd

## 2017-03-03 NOTE — Assessment & Plan Note (Signed)
Multiple sites, stable, continue Prednisone 4mg  qd and prn Tylenol

## 2017-03-03 NOTE — Assessment & Plan Note (Signed)
Mood is stable, continue Mirtazapine 15mg  qd

## 2017-03-03 NOTE — Assessment & Plan Note (Signed)
10/14/16 Na 138, K 4.5, Bun 19, creat 0.77 Heat rate is in control, continue Metoprolol 100mg  qd, Diltiazem 120mg  qd, Xarelto 15mg  qd

## 2017-03-03 NOTE — Assessment & Plan Note (Signed)
Pattern: daily BM, takes Bene fiber, but no BM today, prn MOM available, observe.

## 2017-03-04 NOTE — Progress Notes (Signed)
Location:  [facility] Room Number: 166 Place of Service:  ALF [phone]) Provider:  Mast, Manxie  NP  Jeanmarie Hubert, MD  Patient Care Team: Estill Dooms, MD as PCP - General (Internal Medicine) Man Otho Darner, NP as Nurse Practitioner (Internal Medicine) Peter M Martinique, MD as Consulting Physician (Cardiology) Lavonna Monarch, MD as Consulting Physician (Dermatology) Garald Balding, MD as Consulting Physician (Orthopedic Surgery)  Extended Emergency Contact Information Primary Emergency Contact: Berneta Levins Address: [address] [place], [place] [address] Johnnette Litter of [place] Phone: [phone] Work Phone: [phone] Mobile Phone: [phone] Relation: Daughter Secondary Emergency Contact: Azalee Course [place] of [place] Phone: [phone] Mobile Phone: [phone] Relation: Son  Code Status:  DNR Goals of care: Advanced Directive information Advanced Directives 03/09/2017  Does Patient Have a Medical Advance Directive? Yes  Type of Advance Directive Living will;Out of facility DNR (pink MOST or yellow form)  Does patient want to make changes to medical advance directive? No - Patient declined  Copy of [place] in Chart? -  Would patient like information on creating a medical advance directive? -  Pre-existing out of facility DNR order (yellow form or pink MOST form) Yellow form placed in chart (order not valid for inpatient use)     Chief Complaint  Patient presents with  . Medical Management of Chronic Issues    HPI:  Pt is a 83 y.o. female seen today for medical management of chronic diseases.    Lateral right lower leg skin lesion, pending biopsy result, underwent dermatology consultation.   Hx Afib, EKG 08/31/16 at [facility], taking Xarelto 15mg  daily, heart rate is in control while on Diltiazem 120mg , Metoprolol 100mg  daily, Enalapril 20mg  bid. HTN blood pressure is controlled. Edema, BLE,  trace, Furosemide 40mg  daily. Mood is stable, taking Mirtazapine 15mg  daily. RA, managed with Prednisone 4mg  daily.    Past Medical History:  Diagnosis Date  . Cervicalgia 08/12/2016  . CHF (congestive heart failure) ([place])   . Chronic diastolic CHF (congestive heart failure), NYHA class 3 ([place]) 06/17/2016  . Complication of anesthesia   . Edema 08/12/2016  . Hypertension   . New onset atrial fibrillation ([place]) 04/2016   a. started on Xarelto and Cardizem  . PONV (postoperative nausea and vomiting)   . RA (rheumatoid arthritis) ([place])   . RA (rheumatoid arthritis) ([place])   . SOB (shortness of breath) 04/2016   Hospitalist  . Vertigo    Past Surgical History:  Procedure Laterality Date  . CARDIOVERSION N/A 07/08/2016   Procedure: CARDIOVERSION;  Surgeon: Larey Dresser, MD;  Location: [place];  Service: Cardiovascular;  Laterality: N/A;  . CERVICAL SPINE SURGERY  2005   Dr. Vertell Limber  . HYSTEROSCOPY  2001  . JOINT REPLACEMENT    . REPLACEMENT TOTAL KNEE Right 1996   [place] Bilateral [id number]   Baird Lyons MD    Allergies  Allergen Reactions  . Morphine Sulfate Other (See Comments)    "extremely nauseated"  . Other Nausea And Vomiting    Pt states she is very sensitive to pain medications.    Allergies as of 03/03/2017      Reactions   Morphine Sulfate Other (See Comments)   "extremely nauseated"   Other Nausea And Vomiting   Pt states she is very sensitive to pain medications.      Medication List  Accurate as of 03/03/17 11:59 PM. Always use your most recent med list.          acetaminophen 325 MG tablet Commonly known as:  TYLENOL Take 650 mg by mouth every 6 (six) hours as needed for mild pain.   amoxicillin 500 MG capsule Commonly known as:  AMOXIL Take 2,000 mg by mouth See admin instructions. 1 hour prior to dental procedures   BENEFIBER PO Take 4 scoop by mouth daily.   CALCIUM 600 PO Take 600-900 mg by  mouth 2 (two) times daily. Take one tablet 600 mg in morning, take 1 1/2 tablets in evening for calcium   CLARITIN 10 MG tablet Generic drug:  loratadine Take 10 mg by mouth daily.   dextromethorphan-guaiFENesin 30-600 MG 12hr tablet Commonly known as:  MUCINEX DM One twice daily to suppress cough and congestion   diltiazem 120 MG 24 hr capsule Commonly known as:  CARDIZEM CD Take 1 capsule (120 mg total) by mouth daily.   docusate sodium 100 MG capsule Commonly known as:  COLACE Take 200 mg by mouth every evening.   enalapril 20 MG tablet Commonly known as:  VASOTEC Take 20 mg by mouth 2 (two) times daily.   furosemide 40 MG tablet Commonly known as:  LASIX Take one tablet once a day   Magnesium 250 MG Tabs Take 250 mg by mouth daily.   metoprolol succinate 50 MG 24 hr tablet Commonly known as:  TOPROL-XL Take 50 mg by mouth. Take one once a day   metoprolol succinate 100 MG 24 hr tablet Commonly known as:  TOPROL-XL Take one tablet daily for blood pressure   mirtazapine 15 MG tablet Commonly known as:  REMERON Take one tablet at bedtime   OXYGEN Inhale 2 L into the lungs daily as needed (shortness of breath). Use 2 liter as needed for SOB, 02 sat < 90% on room air   PENNSAID 2 % Soln Generic drug:  Diclofenac Sodium Place onto the skin. Apply sparingly topical to right side of neck as needed twice a day   potassium chloride SA 20 MEQ tablet Commonly known as:  K-DUR,KLOR-CON Take 20 mEq by mouth. Take on table once a day for potassium   predniSONE 1 MG tablet Commonly known as:  DELTASONE Take 4 mg by mouth daily with breakfast.   Rivaroxaban 15 MG Tabs tablet Commonly known as:  XARELTO Take 1 tablet (15 mg total) by mouth daily with supper.   vitamin C 500 MG tablet Commonly known as:  ASCORBIC ACID Take 500 mg by mouth daily.       Review of Systems  Constitutional: Negative for activity change, appetite change, chills, diaphoresis, fatigue,  fever and unexpected weight change.  HENT: Positive for hearing loss. Negative for congestion, ear discharge, ear pain, postnasal drip, rhinorrhea, sore throat, tinnitus, trouble swallowing and voice change.        Xerostomia  Eyes: Negative for pain, redness, itching and visual disturbance.  Respiratory: Negative for cough, choking, shortness of breath and wheezing.        Resolved left sided pain with deep breath 10/17/17w/o intervention,  lasted several hours.   Cardiovascular: Positive for leg swelling. Negative for chest pain and palpitations.       AF. HTN. HX CHF. Cardioversion 07/08/16 by Dr. Aundra Dubin.  Gastrointestinal: Negative for abdominal distention, abdominal pain, constipation, diarrhea and nausea.       Loss of appetite. Hx hemorrhoids.  Endocrine: Negative for cold intolerance, heat  intolerance, polydipsia, polyphagia and polyuria.  Genitourinary: Negative for difficulty urinating, dysuria, flank pain, frequency, hematuria, pelvic pain, urgency and vaginal discharge.  Musculoskeletal: Positive for gait problem (using walker). Negative for arthralgias, back pain, myalgias, neck pain and neck stiffness.       HX RA and OA. OP, senile. Deconditioning and generalized weakness.  Patient 2 left shoulder replacements and 1 right shoulder replacement. Also had a rightt knee replacement. Neck pain in the right posterior neck responding to trigger point injection done 12/22/16. Prior fusion about C3-4-5.  Skin: Negative for color change, pallor and rash.       Lower lateral right lower leg skin lesion  Allergic/Immunologic: Negative.   Neurological: Positive for weakness (generalized). Negative for dizziness, tremors, seizures, syncope, numbness and headaches.       Paresthesias. Hx syncope.  Hematological: Negative for adenopathy. Bruises/bleeds easily.  Psychiatric/Behavioral: Negative for agitation, behavioral problems, confusion, dysphoric mood, hallucinations, sleep disturbance and  suicidal ideas. The patient is not nervous/anxious and is not hyperactive.     Immunization History  Administered Date(s) Administered  . Influenza-Unspecified 09/13/2015, 09/29/2016  . Pneumococcal-Unspecified 05/14/2015  . Tdap 04/13/2011   Pertinent  Health Maintenance Due  Topic Date Due  . PNA vac Low Risk Adult (2 of 2 - PCV13) 05/13/2016  . INFLUENZA VACCINE  Completed  . DEXA SCAN  Completed   Fall Risk  08/12/2016  Falls in the past year? No   Functional Status Survey:    Vitals:   03/03/17 1201  BP: 132/80  Pulse: 76  Resp: 20  Temp: 97.6 F (36.4 C)  Weight: 157 lb 6.4 oz (71.4 kg)  Height: 5\' 1"  (1.549 m)   Body mass index is 29.74 kg/m. Physical Exam  Constitutional: She is oriented to person, place, and time. She appears well-nourished. No distress.  Thin and frail  HENT:  Right Ear: External ear normal.  Left Ear: External ear normal.  Nose: Nose normal.  Mouth/Throat: Oropharynx is clear and moist. No oropharyngeal exudate.  Eyes: Conjunctivae and EOM are normal. Pupils are equal, round, and reactive to light. No scleral icterus.  Prescription lenses  Neck: No JVD present. No tracheal deviation present. No thyromegaly present.  Cardiovascular: Exam reveals no gallop and no friction rub.   No murmur heard. Atrial fibrillation, heart rate is in control  Pulmonary/Chest: Effort normal. No respiratory distress. She has no wheezes. She has no rales. She exhibits no tenderness.  Abdominal: She exhibits no distension and no mass. There is no tenderness.  Musculoskeletal: Normal range of motion. She exhibits edema. She exhibits no tenderness.  Trace edema BLE  Lymphadenopathy:    She has no cervical adenopathy.  Neurological: She is alert and oriented to person, place, and time. No cranial nerve deficit. Coordination normal.  Skin: No rash noted. She is not diaphoretic. No erythema. No pallor.  Lateral right lower leg skin lesion, non healing.     Psychiatric: She has a normal mood and affect. Her behavior is normal. Judgment and thought content normal.    Labs reviewed:  Recent Labs  05/05/16 0444  05/07/16 0301 05/10/16 1142 08/12/16 1932  09/23/16 10/14/16 11/11/16  NA 134*  < > 134* 136 133*  < > 138 138 137  K 3.7  < > 3.9 3.8 3.7  < > 4.3 4.5 4.3  CL 97*  < > 100* 99* 98*  --   --   --   --   CO2 28  < >  26 27 23   --   --   --   --   GLUCOSE 100*  < > 85 110* 109*  --   --   --   --   BUN 9  < > 6 8 13   < > 15 19 22*  CREATININE 0.87  < > 0.71 0.86 0.86  < > 0.8 0.8 1.0  CALCIUM 9.1  < > 8.8* 9.8 9.5  --   --   --   --   MG 2.0  --   --   --   --   --   --   --   --   < > = values in this interval not displayed.  Recent Labs  05/03/16 1554 08/12/16 1932 09/02/16 09/23/16 11/11/16  AST 23 25 18 21 17   ALT 23 16 21 18 10   ALKPHOS 51 45 43 47 51  BILITOT 1.0 0.6  --   --   --   PROT 5.9* 5.7*  --   --   --   ALBUMIN 3.6 3.4*  --   --   --     Recent Labs  05/03/16 1554  05/07/16 0301 05/10/16 1142 08/12/16 1932 09/02/16 09/23/16  WBC 8.3  < > 5.6 6.7 7.3 6.2 5.3  NEUTROABS 7.0  --   --   --  4.3  --   --   HGB 12.6  < > 11.4* 12.8 13.8 13.5 13.6  HCT 39.0  < > 34.5* 39.2 43.0 40 40  MCV 93.8  < > 93.5 93.6 94.7  --   --   PLT 219  < > 207 261 214 202 201  < > = values in this interval not displayed. Lab Results  Component Value Date   TSH 2.12 11/11/2016   Lab Results  Component Value Date   HGBA1C 5.7 09/02/2016   Lab Results  Component Value Date   CHOL 141 11/11/2016   HDL 74 (A) 11/11/2016   LDLCALC 50 11/11/2016   TRIG 87 11/11/2016   CHOLHDL 2.4 05/04/2016    Significant Diagnostic Results in last 30 days:  No results found.  Assessment/Plan Acute respiratory failure with hypoxia ([place]) 03/03/17 pharm Mucinex DM q12h prn.     Essential hypertension 10/14/16 Na 138, K 4.5, Bun 19, creat 0.77 Controlled, continue Diltiazem 120mg  qd, Metoprolol 100mg  qd, Enalapril 20mg   bid   Atrial fibrillation ([facility]) 10/14/16 Na 138, K 4.5, Bun 19, creat 0.77 Heat rate is in control, continue Metoprolol 100mg  qd, Diltiazem 120mg  qd, Xarelto 15mg  qd   Chronic diastolic CHF (congestive heart failure), NYHA class 3 ([facility]) Last creat 0.97 11/11/16, BNP 306.5 09/03/16, compensated, continue Furosemide 40mg  qd  Rheumatoid arthritis ([facility]) Multiple sites, stable, continue Prednisone 4mg  qd and prn Tylenol  Chronic anticoagulation For Afib, continue Xarelto.   Edema leg BLE, improved, continue Furosemide 40mg , 09/23/16 BNP 318. 10/14/16 Na 138, K 4.5, Bun 19, creat 0.77     Depression with anxiety Mood is stable, continue Mirtazapine 15mg  qd  Skin lesion of right lower extremity Lateral right lower leg, non healing, no s/s of infection, s/p dermatology, pending biopsy  Constipation Pattern: daily BM, takes Bene fiber, but no BM today, prn MOM available, observe.      Family/ staff Communication:AL  Labs/tests ordered: none

## 2017-03-09 ENCOUNTER — Encounter: Payer: Self-pay | Admitting: Nurse Practitioner

## 2017-03-18 IMAGING — DX DG CHEST 2V
2 series · 2 of 2 positions shown · non-contrast
Comparison: 05/07/2016

CLINICAL DATA: Shortness of Breath

EXAM:
CHEST  2 VIEW

[chest pa]
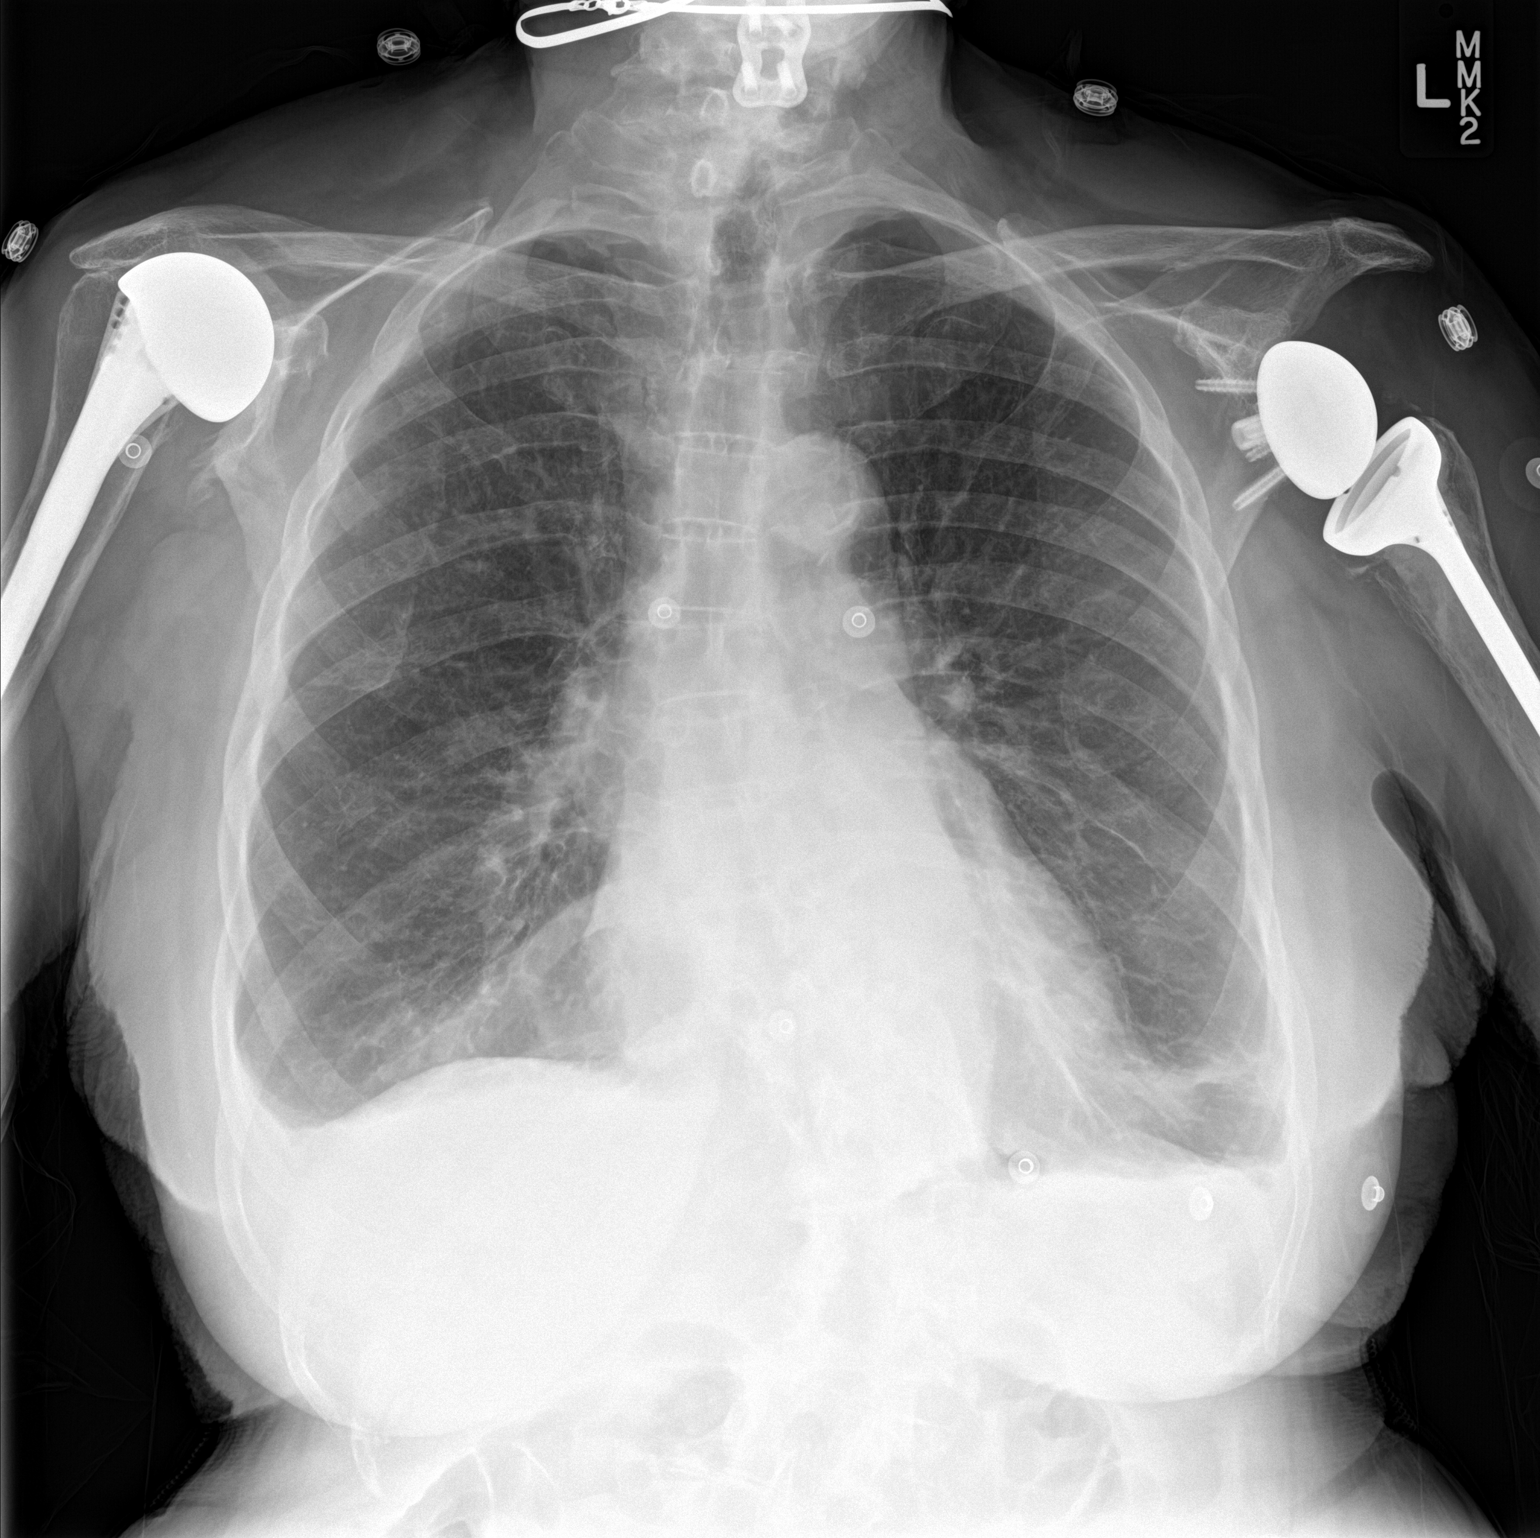

[chest lat]
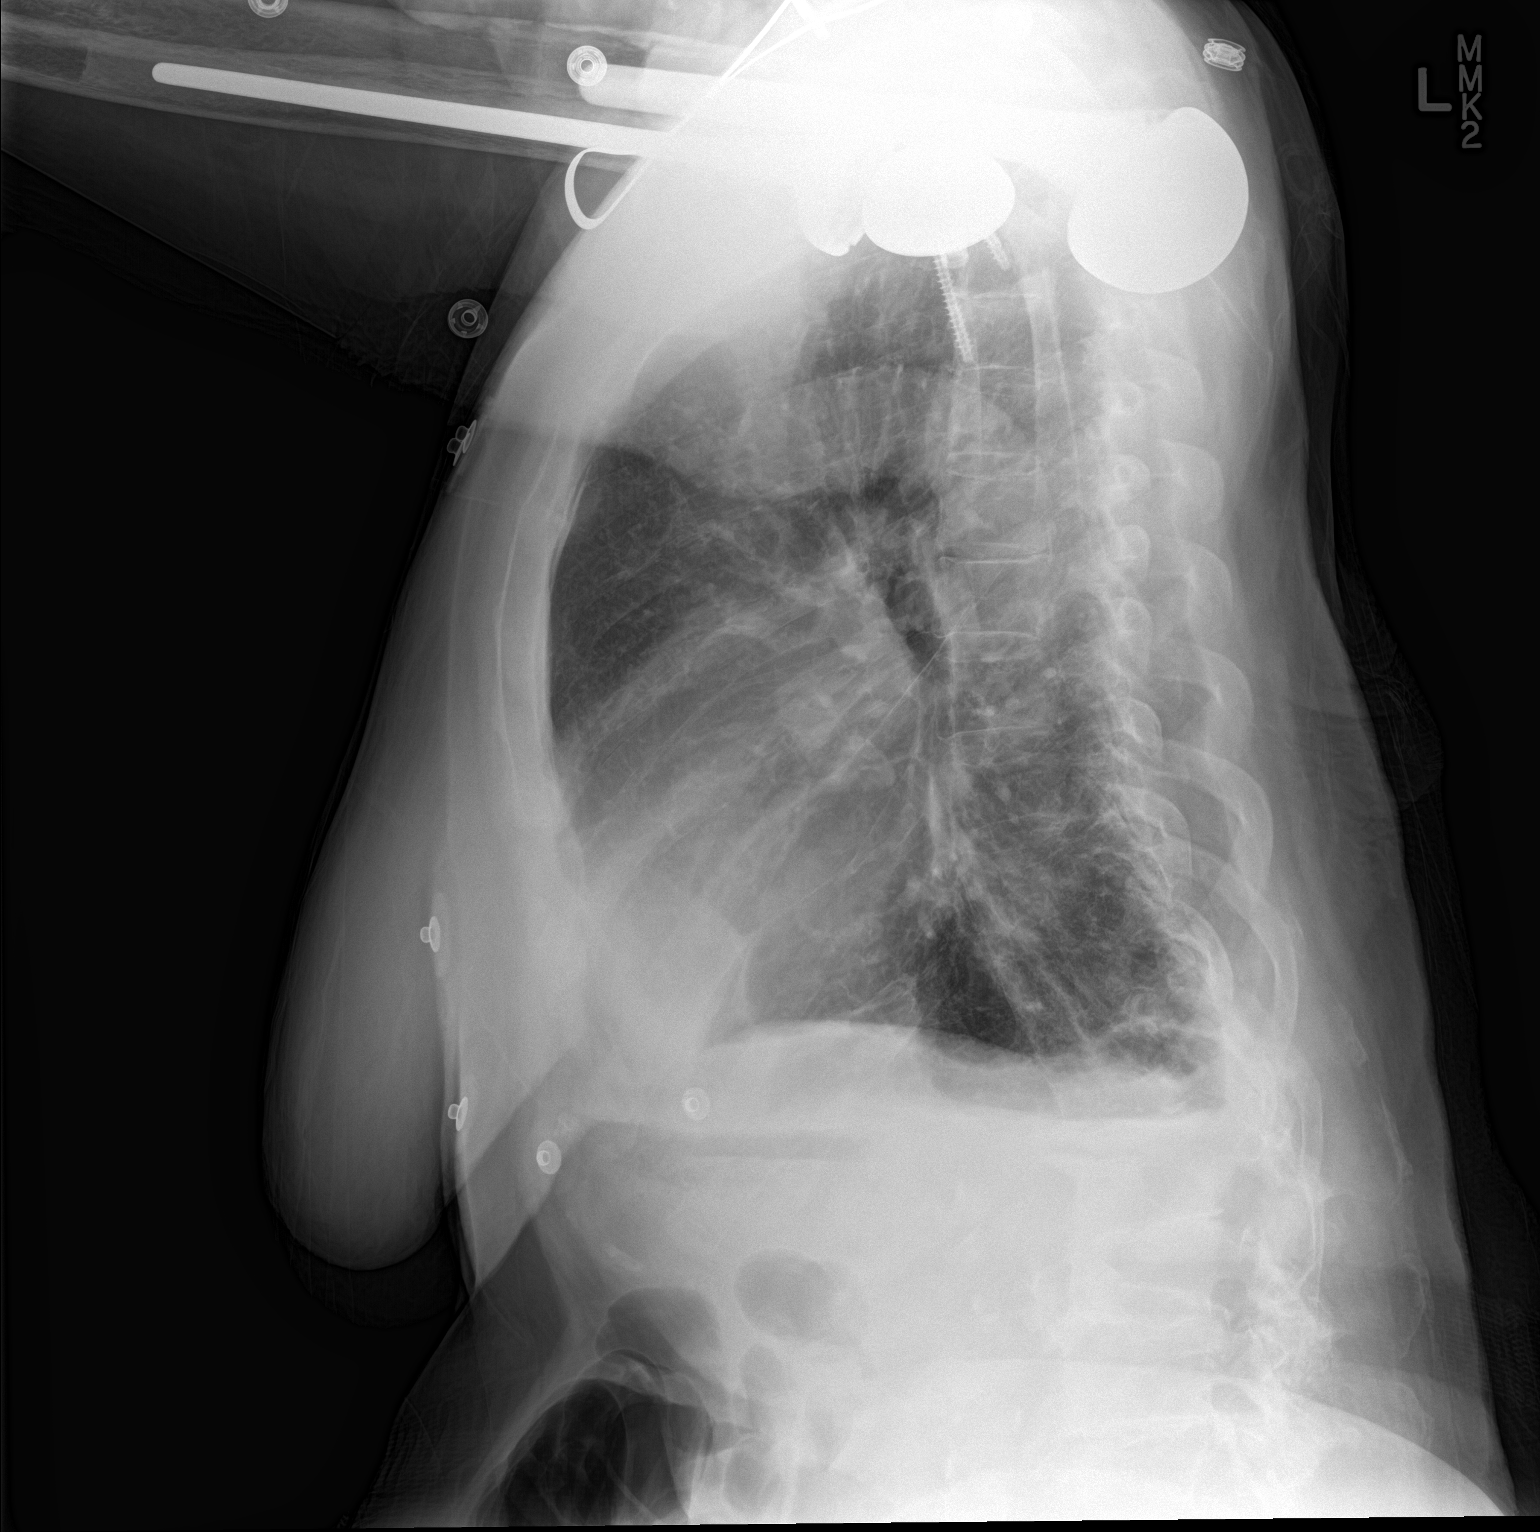

[2 of 2 positions shown; findings below may reference images not displayed]

FINDINGS: Cardiac shadow is stable. Lungs are well aerated bilaterally. Small
pleural effusions are again noted. Mild bibasilar atelectasis is
noted. Previously seen nodular density on the right is not well
appreciated on this exam. Bilateral shoulder replacements are noted.
IMPRESSION: Chronic changes in the bases bilaterally.

## 2017-03-22 ENCOUNTER — Encounter: Payer: Self-pay | Admitting: Internal Medicine

## 2017-03-23 DIAGNOSIS — L570 Actinic keratosis: Secondary | ICD-10-CM | POA: Diagnosis not present

## 2017-03-23 DIAGNOSIS — L814 Other melanin hyperpigmentation: Secondary | ICD-10-CM | POA: Diagnosis not present

## 2017-03-23 DIAGNOSIS — C44722 Squamous cell carcinoma of skin of right lower limb, including hip: Secondary | ICD-10-CM | POA: Diagnosis not present

## 2017-04-11 ENCOUNTER — Encounter: Payer: Self-pay | Admitting: Internal Medicine

## 2017-04-11 ENCOUNTER — Non-Acute Institutional Stay: Payer: PPO | Admitting: Internal Medicine

## 2017-04-11 DIAGNOSIS — M069 Rheumatoid arthritis, unspecified: Secondary | ICD-10-CM | POA: Diagnosis not present

## 2017-04-11 DIAGNOSIS — I10 Essential (primary) hypertension: Secondary | ICD-10-CM

## 2017-04-11 DIAGNOSIS — I5032 Chronic diastolic (congestive) heart failure: Secondary | ICD-10-CM

## 2017-04-11 DIAGNOSIS — I480 Paroxysmal atrial fibrillation: Secondary | ICD-10-CM | POA: Diagnosis not present

## 2017-04-11 DIAGNOSIS — Z7901 Long term (current) use of anticoagulants: Secondary | ICD-10-CM

## 2017-04-11 NOTE — Progress Notes (Signed)
Progress Note    Location:  [facility] Room Number: [id number] Place of Service:  [phone]) Provider: Jeanmarie Hubert, MD  Patient Care Team: Estill Dooms, MD as PCP - General (Internal Medicine) Man Otho Darner, NP as Nurse Practitioner (Internal Medicine) Peter M Martinique, MD as Consulting Physician (Cardiology) Lavonna Monarch, MD as Consulting Physician (Dermatology) Garald Balding, MD as Consulting Physician (Orthopedic Surgery)  Extended Emergency Contact Information Primary Emergency Contact: Berneta Levins Address: [address] [place], [place] [address] Johnnette Litter of [place] Phone: [phone] Work Phone: [phone] Mobile Phone: [phone] Relation: Daughter Secondary Emergency Contact: Azalee Course [place] of [place] Phone: [phone] Mobile Phone: [phone] Relation: Son  Code Status:  DNR Goals of care: Advanced Directive information Advanced Directives 04/11/2017  Does Patient Have a Medical Advance Directive? Yes  Type of Advance Directive Living will;Out of facility DNR (pink MOST or yellow form)  Does patient want to make changes to medical advance directive? -  Copy of [place] in Chart? -  Would patient like information on creating a medical advance directive? -  Pre-existing out of facility DNR order (yellow form or pink MOST form) Yellow form placed in chart (order not valid for inpatient use)     Chief Complaint  Patient presents with  . Medical Management of Chronic Issues    routine visit    HPI:  Pt is a 83 y.o. female seen today for medical management of chronic diseases.    Paroxysmal atrial fibrillation (HCC) - currently rate controlled and anticoagulated.  Chronic anticoagulation - for AF  Chronic diastolic CHF (congestive heart failure), NYHA class 3 (HCC) - compensated  Essential hypertension - controlled  Rheumatoid arthritis, involving unspecified site,  unspecified rheumatoid factor presence ([place]) - controlled on low dose prednisone.     Past Medical History:  Diagnosis Date  . Cervicalgia 08/12/2016  . CHF (congestive heart failure) ([place])   . Chronic diastolic CHF (congestive heart failure), NYHA class 3 ([place] [facility]) 06/17/2016  . Complication of anesthesia   . Edema 08/12/2016  . Hypertension   . New onset atrial fibrillation ([place]) 04/2016   a. started on Xarelto and Cardizem  . PONV (postoperative nausea and vomiting)   . RA (rheumatoid arthritis) ([place])   . SOB (shortness of breath) 04/2016   Hospitalist  . Vertigo    Past Surgical History:  Procedure Laterality Date  . Biopsy right breast Right    fibrocystic changes  . CARDIOVERSION N/A 07/08/2016   Procedure: CARDIOVERSION;  Surgeon: Larey Dresser, MD;  Location: [facility];  Service: Cardiovascular;  Laterality: N/A;  . CERVICAL SPINE SURGERY  2005   Dr. Vertell Limber  . HYSTEROSCOPY  2001  . JOINT REPLACEMENT    . REPLACEMENT TOTAL KNEE Right 1996   [facility] Bilateral [phone]   Baird Lyons MD    Allergies  Allergen Reactions  . Morphine Sulfate Other (See Comments)    "extremely nauseated"  . Other Nausea And Vomiting    Pt states she is very sensitive to pain medications.    Outpatient Encounter Prescriptions as of 04/11/2017  Medication Sig  . acetaminophen (TYLENOL) 325 MG tablet Take 650 mg by mouth every 6 (six) hours as needed for mild pain.  Marland Kitchen amoxicillin (AMOXIL) 500 MG capsule Take 2,000 mg by mouth See admin instructions. 1 hour prior to dental procedures  .  Calcium Carbonate (CALCIUM 600 PO) Take 600-900 mg by mouth. Take one tablet 600 mg in morning, take 1 1/2 tablets in evening for calcium   . dextromethorphan-guaiFENesin (MUCINEX DM) 30-600 MG 12hr tablet One twice daily to suppress cough and congestion  . Diclofenac Sodium (PENNSAID) 2 % SOLN Place onto the skin. Apply sparingly topical to right side of neck as needed  twice a day  . diltiazem (CARDIZEM CD) 120 MG 24 hr capsule Take 1 capsule (120 mg total) by mouth daily.  Marland Kitchen docusate sodium (COLACE) 100 MG capsule Take 200 mg by mouth every evening.  . enalapril (VASOTEC) 20 MG tablet Take 20 mg by mouth 2 (two) times daily.    . furosemide (LASIX) 40 MG tablet Take one tablet once a day  . loratadine (CLARITIN) 10 MG tablet Take 10 mg by mouth daily.  . Magnesium 250 MG TABS Take 250 mg by mouth daily.    . metoprolol succinate (TOPROL-XL) 100 MG 24 hr tablet Take one tablet daily for blood pressure  . metoprolol succinate (TOPROL-XL) 50 MG 24 hr tablet Take 50 mg by mouth. Take one once a day  . mirtazapine (REMERON) 15 MG tablet Take one tablet at bedtime  . OXYGEN Inhale 2 L into the lungs daily as needed (shortness of breath). Use 2 liter as needed for SOB, 02 sat < 90% on room air   . potassium chloride SA (K-DUR,KLOR-CON) 20 MEQ tablet Take 20 mEq by mouth. Take on table once a day for potassium  . predniSONE (DELTASONE) 1 MG tablet Take 4 mg by mouth daily with breakfast.  . Rivaroxaban (XARELTO) 15 MG TABS tablet Take 1 tablet (15 mg total) by mouth daily with supper.  . vitamin C (ASCORBIC ACID) 500 MG tablet Take 500 mg by mouth daily.    . Wheat Dextrin (BENEFIBER PO) Take 4 scoop by mouth daily.    No facility-administered encounter medications on file as of 04/11/2017.     Review of Systems  Constitutional: Negative for activity change, appetite change, chills, diaphoresis, fatigue, fever and unexpected weight change.  HENT: Positive for hearing loss. Negative for congestion, ear discharge, ear pain, postnasal drip, rhinorrhea, sore throat, tinnitus, trouble swallowing and voice change.        Xerostomia  Eyes: Negative for pain, redness, itching and visual disturbance.  Respiratory: Negative for cough, choking, shortness of breath and wheezing.        Resolved left sided pain with deep breath 10/17/17w/o intervention,  lasted several hours.    Cardiovascular: Positive for leg swelling. Negative for chest pain and palpitations.       AF. HTN. HX CHF. Cardioversion 07/08/16 by Dr. Aundra Dubin.  Gastrointestinal: Negative for abdominal distention, abdominal pain, constipation, diarrhea and nausea.       Loss of appetite. Hx hemorrhoids.  Endocrine: Negative for cold intolerance, heat intolerance, polydipsia, polyphagia and polyuria.  Genitourinary: Negative for difficulty urinating, dysuria, flank pain, frequency, hematuria, pelvic pain, urgency and vaginal discharge.  Musculoskeletal: Positive for gait problem (using walker). Negative for arthralgias, back pain, myalgias, neck pain and neck stiffness.       HX RA and OA. OP, senile. Deconditioning and generalized weakness.  Patient 2 left shoulder replacements and 1 right shoulder replacement. Also had a rightt knee replacement. Neck pain in the right posterior neck responding to trigger point injection done 12/22/16. Prior fusion about C3-4-5.  Skin: Negative for color change, pallor and rash.       Forearm  for about 2 months. Patient relates to cut, but it appears more treated keratoacanthoma and possible skin cancer.  Allergic/Immunologic: Negative.   Neurological: Positive for weakness (generalized). Negative for dizziness, tremors, seizures, syncope, numbness and headaches.       Paresthesias. Hx syncope.  Hematological: Negative for adenopathy. Bruises/bleeds easily.  Psychiatric/Behavioral: Negative for agitation, behavioral problems, confusion, dysphoric mood, hallucinations, sleep disturbance and suicidal ideas. The patient is not nervous/anxious and is not hyperactive.     Immunization History  Administered Date(s) Administered  . Influenza-Unspecified 09/13/2015, 09/29/2016  . Pneumococcal-Unspecified 05/14/2015  . Tdap 04/13/2011   Pertinent  Health Maintenance Due  Topic Date Due  . PNA vac Low Risk Adult (2 of 2 - PCV13) 05/13/2016  . INFLUENZA VACCINE  07/13/2017  .  DEXA SCAN  Completed   Fall Risk  08/12/2016  Falls in the past year? No   Functional Status Survey:    Vitals:   04/11/17 1628  BP: (!) 142/74  Pulse: 76  Resp: 20  Temp: 98.3 F (36.8 C)  Weight: 150 lb (68 kg)  Height: 5\' 1"  (1.549 m)   Body mass index is 28.34 kg/m. Physical Exam  Constitutional: She is oriented to person, place, and time. She appears well-nourished. No distress.  Thin and frail  HENT:  Right Ear: External ear normal.  Left Ear: External ear normal.  Nose: Nose normal.  Mouth/Throat: Oropharynx is clear and moist. No oropharyngeal exudate.  Eyes: Conjunctivae and EOM are normal. Pupils are equal, round, and reactive to light. No scleral icterus.  Prescription lenses  Neck: No JVD present. No tracheal deviation present. No thyromegaly present.  Cardiovascular: Exam reveals no gallop and no friction rub.   No murmur heard. Atrial fibrillation, heart rate is in control  Pulmonary/Chest: Effort normal. No respiratory distress. She has no wheezes. She has no rales. She exhibits no tenderness.  Abdominal: She exhibits no distension and no mass. There is no tenderness.  Musculoskeletal: Normal range of motion. She exhibits edema. She exhibits no tenderness.  Trace edema BLE  Lymphadenopathy:    She has no cervical adenopathy.  Neurological: She is alert and oriented to person, place, and time. No cranial nerve deficit. Coordination normal.  Skin: No rash noted. She is not diaphoretic. No erythema. No pallor.  Lateral right lower leg skin lesion, non healing.   Psychiatric: She has a normal mood and affect. Her behavior is normal. Judgment and thought content normal.    Labs reviewed:  Recent Labs  05/05/16 0444  05/07/16 0301 05/10/16 1142 08/12/16 1932  09/23/16 10/14/16 11/11/16  NA 134*  < > 134* 136 133*  < > 138 138 137  K 3.7  < > 3.9 3.8 3.7  < > 4.3 4.5 4.3  CL 97*  < > 100* 99* 98*  --   --   --   --   CO2 28  < > 26 27 23   --   --   --    --   GLUCOSE 100*  < > 85 110* 109*  --   --   --   --   BUN 9  < > 6 8 13   < > 15 19 22*  CREATININE 0.87  < > 0.71 0.86 0.86  < > 0.8 0.8 1.0  CALCIUM 9.1  < > 8.8* 9.8 9.5  --   --   --   --   MG 2.0  --   --   --   --   --   --   --   --   < > =  values in this interval not displayed.  Recent Labs  05/03/16 1554 08/12/16 1932 09/02/16 09/23/16 11/11/16  AST 23 25 18 21 17   ALT 23 16 21 18 10   ALKPHOS 51 45 43 47 51  BILITOT 1.0 0.6  --   --   --   PROT 5.9* 5.7*  --   --   --   ALBUMIN 3.6 3.4*  --   --   --     Recent Labs  05/03/16 1554  05/07/16 0301 05/10/16 1142 08/12/16 1932 09/02/16 09/23/16  WBC 8.3  < > 5.6 6.7 7.3 6.2 5.3  NEUTROABS 7.0  --   --   --  4.3  --   --   HGB 12.6  < > 11.4* 12.8 13.8 13.5 13.6  HCT 39.0  < > 34.5* 39.2 43.0 40 40  MCV 93.8  < > 93.5 93.6 94.7  --   --   PLT 219  < > 207 261 214 202 201  < > = values in this interval not displayed. Lab Results  Component Value Date   TSH 2.12 11/11/2016   Lab Results  Component Value Date   HGBA1C 5.7 09/02/2016   Lab Results  Component Value Date   CHOL 141 11/11/2016   HDL 74 (A) 11/11/2016   LDLCALC 50 11/11/2016   TRIG 87 11/11/2016   CHOLHDL 2.4 05/04/2016    Assessment/Plan 1. Paroxysmal atrial fibrillation (HCC) Continue Eliquis  2. Chronic anticoagulation Continue Eliquis  3. Chronic diastolic CHF (congestive heart failure), NYHA class 3 (HCC) The current medical regimen is effective;  continue present plan and medications.  4. Essential hypertension The current medical regimen is effective;  continue present plan and medications.  5. Rheumatoid arthritis, involving unspecified site, unspecified rheumatoid factor presence (HCC) Continue 4 mg qd prednisone.

## 2017-04-18 ENCOUNTER — Encounter: Payer: Self-pay | Admitting: Cardiology

## 2017-04-25 DIAGNOSIS — C44722 Squamous cell carcinoma of skin of right lower limb, including hip: Secondary | ICD-10-CM | POA: Diagnosis not present

## 2017-04-25 DIAGNOSIS — D229 Melanocytic nevi, unspecified: Secondary | ICD-10-CM | POA: Diagnosis not present

## 2017-04-25 DIAGNOSIS — L821 Other seborrheic keratosis: Secondary | ICD-10-CM | POA: Diagnosis not present

## 2017-05-02 NOTE — Progress Notes (Signed)
05/02/2017 [name]   Feb 28, 1928  [id number]  Primary Physician [name] [name], MD Primary Cardiologist: Dr [name]  HPI:  83 y/o female seen for follow up Afib.  Her daughter, (a former Therapist, sports on the renal unit) is with her. She presented in  May 2017. She started to complain of dyspnea which was fairly rapid in onset. She presented to the ED and was noted to be in AF and CHF.  She was placed on Xarelto and diuresed. Her beta blocker was adjusted. She underwent OP DCCV 07/08/16 but failed to hold NSR and was back in AF with CVR by the end of August. She was last seen with complaints of new LE edema with weeping areas. She was placed on lasix by her PCP without significant improvement. Her lasix dose was increased to 40 mg daily with improvement. Echo showed normal LV function, biatrial enlargement, pulmonary HTN.   On follow up today she states she is doing well. She reports weight at home is stable at 158 lbs. She denies any swelling, weeping or increased SOB. Daughter thinks she is not as active as before.    She denies any dyspnea, orthopnea, or PND. No chest pain or dizziness. 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    Current Outpatient Prescriptions  Medication Sig Dispense Refill  . acetaminophen (TYLENOL) 325 MG tablet Take 650 mg by mouth every 6 (six) hours as needed for mild pain.    Marland Kitchen amoxicillin (AMOXIL) 500 MG capsule Take 2,000 mg by mouth See admin instructions. 1 hour prior to dental procedures    . Calcium Carbonate (CALCIUM 600 PO) Take 600-900 mg by mouth. Take one tablet 600 mg in morning, take 1 1/2 tablets in evening for calcium     . dextromethorphan-guaiFENesin (MUCINEX DM) 30-600 MG 12hr tablet One twice daily to suppress cough and congestion 20 tablet 1  . Diclofenac Sodium (PENNSAID) 2 % SOLN Place onto the skin. Apply sparingly topical to right side of neck as needed twice a day    . diltiazem (CARDIZEM CD) 120 MG 24 hr capsule Take 1  capsule (120 mg total) by mouth daily. 90 capsule 3  . docusate sodium (COLACE) 100 MG capsule Take 200 mg by mouth every evening.    . enalapril (VASOTEC) 20 MG tablet Take 20 mg by mouth 2 (two) times daily.      . furosemide (LASIX) 40 MG tablet Take one tablet once a day    . loratadine (CLARITIN) 10 MG tablet Take 10 mg by mouth daily.    . Magnesium 250 MG TABS Take 250 mg by mouth daily.      . metoprolol succinate (TOPROL-XL) 100 MG 24 hr tablet Take one tablet daily for blood pressure    . metoprolol succinate (TOPROL-XL) 50 MG 24 hr tablet Take 50 mg by mouth. Take one once a day    . mirtazapine (REMERON) 15 MG tablet Take one tablet at bedtime    . OXYGEN Inhale 2 L into the lungs daily as needed (shortness of breath). Use 2 liter as needed for SOB, 02 sat < 90% on room air     . potassium chloride SA (K-DUR,KLOR-CON) 20 MEQ tablet Take 20 mEq by mouth. Take on table once a day for potassium    . predniSONE (DELTASONE) 1 MG tablet Take 4 mg by mouth daily with breakfast.    . Rivaroxaban (XARELTO) 15 MG TABS tablet  Take 1 tablet (15 mg total) by mouth daily with supper. 90 tablet 3  . vitamin C (ASCORBIC ACID) 500 MG tablet Take 500 mg by mouth daily.      . Wheat Dextrin (BENEFIBER PO) Take 4 scoop by mouth daily.      No current facility-administered medications for this visit.     Allergies  Allergen Reactions  . Morphine Sulfate Other (See Comments)    "extremely nauseated"  . Other Nausea And Vomiting    Pt states she is very sensitive to pain medications.    Social History   Social History  . Marital status: Widowed    Spouse name: N/A  . Number of children: N/A  . Years of education: N/A   Occupational History  . Retired    Social History Main Topics  . Smoking status: Former Smoker    Quit date: 12/13/1966  . Smokeless tobacco: Never Used  . Alcohol use No  . Drug use: No  . Sexual activity: No   Other Topics Concern  . Not on file   Social History  Narrative   Living in [place] since 05/09/2016   Widowed   Former smoked -stopped 1968   Alcohol none   Exercise -none. Sits in wheelchair using her feet to move around   DNR, POA, Living will     Review of Systems: As noted in HPI All other systems reviewed and are otherwise negative except as noted above.    There were no vitals taken for this visit.  General appearance: alert, cooperative and no distress Neck: no JVD Lungs: clear to auscultation bilaterally Heart: irregularly irregular rhythm Extremities: bilateral tr-1+ pre tibial  edema Neurologic: Grossly normal   Lab Results  Component Value Date   WBC 5.3 09/23/2016   HGB 13.6 09/23/2016   HCT 40 09/23/2016   PLT 201 09/23/2016   GLUCOSE 109 (H) 08/12/2016   CHOL 141 11/11/2016   TRIG 87 11/11/2016   HDL 74 (A) 11/11/2016   LDLCALC 50 11/11/2016   ALT 10 11/11/2016   AST 17 11/11/2016   NA 137 11/11/2016   K 4.3 11/11/2016   CL 98 (L) 08/12/2016   CREATININE 1.0 11/11/2016   BUN 22 (A) 11/11/2016   CO2 23 08/12/2016   TSH 2.12 11/11/2016   INR 1.23 05/03/2016   HGBA1C 5.7 09/02/2016      Echo: 10/08/16:  Study Conclusions  - Left ventricle: The cavity size was normal. Systolic function was   normal. The estimated ejection fraction was in the range of 60%   to 65%. Wall motion was normal; there were no regional wall   motion abnormalities. - Aortic valve: There was trivial regurgitation. - Mitral valve: Calcified annulus. Mildly thickened leaflets .   There was mild regurgitation. - Left atrium: The atrium was moderately dilated. - Right atrium: The atrium was moderately dilated. - Tricuspid valve: There was mild-moderate regurgitation. There was   evidence of perivalvular regurgitation. - Pulmonary arteries: Systolic pressure was moderately increased.   PA peak pressure: 54 mm Hg (S).  ASSESSMENT AND PLAN:  1. Atrial fibrillation- persistent and probably permanent. Rate is well  controlled. She is asymptomatic. On Xarelto for anticoagulation. I would continue strategy of rate control and anticoagulation.  2. Chronic diastolic CHF. She is well compensated on lasix 40 mg dialy.  Will continue current therapy. Stressed importance of sodium restriction states she eats one strip of bacon daily. Weigh daily.  3. RA  4. HTN .  Follow up in 6 months.  Delonda Coley Martinique MD,FACC 05/02/2017 7:22 AM

## 2017-05-03 ENCOUNTER — Encounter: Payer: Self-pay | Admitting: Cardiology

## 2017-05-03 ENCOUNTER — Ambulatory Visit (INDEPENDENT_AMBULATORY_CARE_PROVIDER_SITE_OTHER): Payer: PPO | Admitting: Cardiology

## 2017-05-03 VITALS — BP 150/87 | HR 81 | Ht 63.0 in | Wt 159.8 lb

## 2017-05-03 DIAGNOSIS — I10 Essential (primary) hypertension: Secondary | ICD-10-CM

## 2017-05-03 DIAGNOSIS — I5032 Chronic diastolic (congestive) heart failure: Secondary | ICD-10-CM

## 2017-05-03 DIAGNOSIS — I4819 Other persistent atrial fibrillation: Secondary | ICD-10-CM

## 2017-05-03 DIAGNOSIS — Z7901 Long term (current) use of anticoagulants: Secondary | ICD-10-CM | POA: Diagnosis not present

## 2017-05-03 DIAGNOSIS — I481 Persistent atrial fibrillation: Secondary | ICD-10-CM

## 2017-05-03 NOTE — Patient Instructions (Addendum)
Continue your current therapy  Continue sodium restriction  Good luck with your surgery  I will see you in 6 months.

## 2017-05-05 DIAGNOSIS — C44722 Squamous cell carcinoma of skin of right lower limb, including hip: Secondary | ICD-10-CM | POA: Diagnosis not present

## 2017-05-12 ENCOUNTER — Encounter: Payer: Self-pay | Admitting: Nurse Practitioner

## 2017-05-12 ENCOUNTER — Non-Acute Institutional Stay: Payer: PPO | Admitting: Nurse Practitioner

## 2017-05-12 DIAGNOSIS — F418 Other specified anxiety disorders: Secondary | ICD-10-CM | POA: Diagnosis not present

## 2017-05-12 DIAGNOSIS — M069 Rheumatoid arthritis, unspecified: Secondary | ICD-10-CM

## 2017-05-12 DIAGNOSIS — K5900 Constipation, unspecified: Secondary | ICD-10-CM

## 2017-05-12 DIAGNOSIS — I10 Essential (primary) hypertension: Secondary | ICD-10-CM

## 2017-05-12 DIAGNOSIS — I5032 Chronic diastolic (congestive) heart failure: Secondary | ICD-10-CM

## 2017-05-12 DIAGNOSIS — I480 Paroxysmal atrial fibrillation: Secondary | ICD-10-CM

## 2017-05-12 NOTE — Progress Notes (Signed)
Location:  [facility] Room Number: 825 Place of Service:  [phone]) Provider:  Mast, Manxie  NP  Estill Dooms, MD  Patient Care Team: Estill Dooms, MD as PCP - General (Internal Medicine) Mast, Man X, NP as Nurse Practitioner (Internal Medicine) Martinique, Peter M, MD as Consulting Physician (Cardiology) Lavonna Monarch, MD as Consulting Physician (Dermatology) Garald Balding, MD as Consulting Physician (Orthopedic Surgery)  Extended Emergency Contact Information Primary Emergency Contact: Berneta Levins Address: [address] [place], [place] [address] Johnnette Litter of [place] Phone: [phone] Work Phone: [phone] Mobile Phone: [phone] Relation: Daughter Secondary Emergency Contact: Azalee Course [place] of [place] Phone: [phone] Mobile Phone: [phone] Relation: Son  Code Status:  DNR Goals of care: Advanced Directive information Advanced Directives 05/12/2017  Does Patient Have a Medical Advance Directive? Yes  Type of Advance Directive Living will;Out of facility DNR (pink MOST or yellow form)  Does patient want to make changes to medical advance directive? No - Patient declined  Copy of [place] in Chart? -  Would patient like information on creating a medical advance directive? -  Pre-existing out of facility DNR order (yellow form or pink MOST form) Yellow form placed in chart (order not valid for inpatient use)     Chief Complaint  Patient presents with  . Medical Management of Chronic Issues    HPI:  Pt is a 83 y.o. female seen today for medical management of chronic diseases.      Hx Afib, EKG 08/31/16 at [facility] AFib, taking Xarelto 15mg  daily, heart rate is in control while on Diltiazem 120mg , Metoprolol 100mg  daily, Enalapril 20mg  bid. HTN blood pressure is controlled. Edema, BLE, trace, Furosemide 40mg  daily. Mood is stable, taking Mirtazapine 15mg  daily. RA, managed  with Prednisone 4mg  daily.    Past Medical History:  Diagnosis Date  . Cervicalgia 08/12/2016  . CHF (congestive heart failure) ([place])   . Chronic diastolic CHF (congestive heart failure), NYHA class 3 ([place]) 06/17/2016  . Complication of anesthesia   . Edema 08/12/2016  . Hypertension   . New onset atrial fibrillation ([place]) 04/2016   a. started on Xarelto and Cardizem  . PONV (postoperative nausea and vomiting)   . RA (rheumatoid arthritis) ([place])   . SOB (shortness of breath) 04/2016   Hospitalist  . Vertigo    Past Surgical History:  Procedure Laterality Date  . Biopsy right breast Right    fibrocystic changes  . CARDIOVERSION N/A 07/08/2016   Procedure: CARDIOVERSION;  Surgeon: Larey Dresser, MD;  Location: [facility];  Service: Cardiovascular;  Laterality: N/A;  . CERVICAL SPINE SURGERY  2005   Dr. Vertell Limber  . HYSTEROSCOPY  2001  . JOINT REPLACEMENT    . REPLACEMENT TOTAL KNEE Right 1996   [place] Bilateral [phone]   Baird Lyons MD    Allergies  Allergen Reactions  . Morphine Sulfate Other (See Comments)    "extremely nauseated"  . Other Nausea And Vomiting    Pt states she is very sensitive to pain medications.    Allergies as of 05/12/2017      Reactions   Morphine Sulfate Other (See Comments)   "extremely nauseated"   Other Nausea And Vomiting   Pt states she is very sensitive to pain medications.      Medication List       Accurate as of  05/12/17  4:32 PM. Always use your most recent med list.          acetaminophen 325 MG tablet Commonly known as:  TYLENOL Take 650 mg by mouth every 6 (six) hours as needed for mild pain.   amoxicillin 500 MG capsule Commonly known as:  AMOXIL Take 2,000 mg by mouth See admin instructions. 1 hour prior to dental procedures   BENEFIBER PO Take 4 scoop by mouth daily.   CALCIUM 600 PO Take 600-900 mg by mouth. Take one tablet 600 mg in morning, take 1 1/2 tablets in evening  for calcium   cephALEXin 500 MG capsule Commonly known as:  KEFLEX Take 500 mg by mouth 3 (three) times daily.   CLARITIN 10 MG tablet Generic drug:  loratadine Take 10 mg by mouth daily.   dextromethorphan-guaiFENesin 30-600 MG 12hr tablet Commonly known as:  MUCINEX DM One twice daily to suppress cough and congestion   diltiazem 120 MG 24 hr capsule Commonly known as:  CARDIZEM CD Take 1 capsule (120 mg total) by mouth daily.   docusate sodium 100 MG capsule Commonly known as:  COLACE Take 200 mg by mouth every evening.   enalapril 20 MG tablet Commonly known as:  VASOTEC Take 20 mg by mouth 2 (two) times daily.   furosemide 40 MG tablet Commonly known as:  LASIX Take one tablet once a day   Magnesium 250 MG Tabs Take 250 mg by mouth daily.   metoprolol succinate 50 MG 24 hr tablet Commonly known as:  TOPROL-XL Take 50 mg by mouth. Take one once a day   metoprolol succinate 100 MG 24 hr tablet Commonly known as:  TOPROL-XL Take one tablet daily for blood pressure   mirtazapine 15 MG tablet Commonly known as:  REMERON Take one tablet at bedtime   OXYGEN Inhale 2 L into the lungs daily as needed (shortness of breath). Use 2 liter as needed for SOB, 02 sat < 90% on room air   PENNSAID 2 % Soln Generic drug:  Diclofenac Sodium Place onto the skin. Apply sparingly topical to right side of neck as needed twice a day   potassium chloride SA 20 MEQ tablet Commonly known as:  K-DUR,KLOR-CON Take 20 mEq by mouth. Take on table once a day for potassium   predniSONE 1 MG tablet Commonly known as:  DELTASONE Take 4 mg by mouth daily with breakfast.   Rivaroxaban 15 MG Tabs tablet Commonly known as:  XARELTO Take 1 tablet (15 mg total) by mouth daily with supper.   vitamin C 500 MG tablet Commonly known as:  ASCORBIC ACID Take 500 mg by mouth daily.       Review of Systems  Constitutional: Negative for activity change, appetite change, chills, diaphoresis,  fatigue, fever and unexpected weight change.  HENT: Positive for hearing loss. Negative for congestion, ear discharge, ear pain, postnasal drip, rhinorrhea, sore throat, tinnitus, trouble swallowing and voice change.        Xerostomia  Eyes: Negative for pain, redness, itching and visual disturbance.  Respiratory: Negative for cough, choking, shortness of breath and wheezing.        Resolved left sided pain with deep breath 10/17/17w/o intervention,  lasted several hours.   Cardiovascular: Positive for leg swelling. Negative for chest pain and palpitations.       AF. HTN. HX CHF. Cardioversion 07/08/16 by Dr. Aundra Dubin.  Gastrointestinal: Negative for abdominal distention, abdominal pain, constipation, diarrhea and nausea.  Loss of appetite. Hx hemorrhoids.  Endocrine: Negative for cold intolerance, heat intolerance, polydipsia, polyphagia and polyuria.  Genitourinary: Negative for difficulty urinating, dysuria, flank pain, frequency, hematuria, pelvic pain, urgency and vaginal discharge.  Musculoskeletal: Positive for gait problem (using walker). Negative for arthralgias, back pain, myalgias, neck pain and neck stiffness.       HX RA and OA. OP, senile. Deconditioning and generalized weakness.  Patient 2 left shoulder replacements and 1 right shoulder replacement. Also had a rightt knee replacement. Neck pain in the right posterior neck responding to trigger point injection done 12/22/16. Prior fusion about C3-4-5.  Skin: Negative for color change, pallor and rash.       Lower lateral right lower leg skin lesion  Allergic/Immunologic: Negative.   Neurological: Positive for weakness (generalized). Negative for dizziness, tremors, seizures, syncope, numbness and headaches.       Paresthesias. Hx syncope.  Hematological: Negative for adenopathy. Bruises/bleeds easily.  Psychiatric/Behavioral: Negative for agitation, behavioral problems, confusion, dysphoric mood, hallucinations, sleep  disturbance and suicidal ideas. The patient is not nervous/anxious and is not hyperactive.     Immunization History  Administered Date(s) Administered  . Influenza-Unspecified 09/13/2015, 09/29/2016  . Pneumococcal-Unspecified 05/14/2015  . Tdap 04/13/2011   Pertinent  Health Maintenance Due  Topic Date Due  . PNA vac Low Risk Adult (2 of 2 - PCV13) 05/13/2016  . INFLUENZA VACCINE  07/13/2017  . DEXA SCAN  Completed   Fall Risk  08/12/2016  Falls in the past year? No   Functional Status Survey:    Vitals:   05/12/17 1135  BP: 130/90  Pulse: 68  Resp: 20  Temp: 98.2 F (36.8 C)  Weight: 158 lb 9.6 oz (71.9 kg)  Height: 5\' 1"  (1.549 m)   Body mass index is 29.97 kg/m. Physical Exam  Constitutional: She is oriented to person, place, and time. She appears well-nourished. No distress.  Thin and frail  HENT:  Right Ear: External ear normal.  Left Ear: External ear normal.  Nose: Nose normal.  Mouth/Throat: Oropharynx is clear and moist. No oropharyngeal exudate.  Eyes: Conjunctivae and EOM are normal. Pupils are equal, round, and reactive to light. No scleral icterus.  Prescription lenses  Neck: No JVD present. No tracheal deviation present. No thyromegaly present.  Cardiovascular: Exam reveals no gallop and no friction rub.   No murmur heard. Atrial fibrillation, heart rate is in control  Pulmonary/Chest: Effort normal. No respiratory distress. She has no wheezes. She has no rales. She exhibits no tenderness.  Abdominal: She exhibits no distension and no mass. There is no tenderness.  Musculoskeletal: Normal range of motion. She exhibits edema. She exhibits no tenderness.  Trace edema BLE  Lymphadenopathy:    She has no cervical adenopathy.  Neurological: She is alert and oriented to person, place, and time. No cranial nerve deficit. Coordination normal.  Skin: No rash noted. She is not diaphoretic. No erythema. No pallor.  Lateral right lower leg skin lesion, non  healing.   Psychiatric: She has a normal mood and affect. Her behavior is normal. Judgment and thought content normal.    Labs reviewed:  Recent Labs  08/12/16 1932  09/23/16 10/14/16 11/11/16  NA 133*  < > 138 138 137  K 3.7  < > 4.3 4.5 4.3  CL 98*  --   --   --   --   CO2 23  --   --   --   --   GLUCOSE 109*  --   --   --   --  BUN 13  < > 15 19 22*  CREATININE 0.86  < > 0.8 0.8 1.0  CALCIUM 9.5  --   --   --   --   < > = values in this interval not displayed.  Recent Labs  08/12/16 1932 09/02/16 09/23/16 11/11/16  AST 25 18 21 17   ALT 16 21 18 10   ALKPHOS 45 43 47 51  BILITOT 0.6  --   --   --   PROT 5.7*  --   --   --   ALBUMIN 3.4*  --   --   --     Recent Labs  08/12/16 1932 09/02/16 09/23/16  WBC 7.3 6.2 5.3  NEUTROABS 4.3  --   --   HGB 13.8 13.5 13.6  HCT 43.0 40 40  MCV 94.7  --   --   PLT 214 202 201   Lab Results  Component Value Date   TSH 2.12 11/11/2016   Lab Results  Component Value Date   HGBA1C 5.7 09/02/2016   Lab Results  Component Value Date   CHOL 141 11/11/2016   HDL 74 (A) 11/11/2016   LDLCALC 50 11/11/2016   TRIG 87 11/11/2016   CHOLHDL 2.4 05/04/2016    Significant Diagnostic Results in last 30 days:  No results found.  Assessment/Plan Essential hypertension Controlled, continue Diltiazem 120mg  qd, Metoprolol 100mg  qd, Enalapril 20mg  bid, Furosemide 40mg  qd 05/03/17 cardiology Dr. Peter Martinique 05/12/17 update CBC CMP TSH Hgb a1c  Atrial fibrillation (HCC) Heat rate is in control, continue Metoprolol 100mg  qd, Diltiazem 120mg  qd, Xarelto 15mg  qd  Chronic diastolic CHF (congestive heart failure), NYHA class 3 (HCC) Last BNP 306.5 09/03/16, compensated, continue Furosemide 40mg  qd  Constipation Pattern: daily BM, takes Bene fiber, prn MOM available, observe.   Rheumatoid arthritis (HCC) Multiple sites, stable, continue Prednisone 4mg  qd and prn Tylenol  Depression with anxiety Mood is stable, continue Mirtazapine  15mg  qd     Family/ staff Communication:AL  Labs/tests ordered: CBC CMP TSH Hgb A1c

## 2017-05-12 NOTE — Assessment & Plan Note (Signed)
Last BNP 306.5 09/03/16, compensated, continue Furosemide 40mg  qd

## 2017-05-12 NOTE — Assessment & Plan Note (Signed)
Multiple sites, stable, continue Prednisone 4mg  qd and prn Tylenol

## 2017-05-12 NOTE — Assessment & Plan Note (Signed)
Heat rate is in control, continue Metoprolol 100mg  qd, Diltiazem 120mg  qd, Xarelto 15mg  qd

## 2017-05-12 NOTE — Assessment & Plan Note (Signed)
Mood is stable, continue Mirtazapine 15mg  qd

## 2017-05-12 NOTE — Assessment & Plan Note (Addendum)
Controlled, continue Diltiazem 120mg  qd, Metoprolol 100mg  qd, Enalapril 20mg  bid, Furosemide 40mg  qd 05/03/17 cardiology Dr. Peter Martinique 05/12/17 update CBC CMP TSH Hgb a1c

## 2017-05-12 NOTE — Assessment & Plan Note (Signed)
Pattern: daily BM, takes Bene fiber, prn MOM available, observe.

## 2017-05-17 DIAGNOSIS — I10 Essential (primary) hypertension: Secondary | ICD-10-CM | POA: Diagnosis not present

## 2017-05-17 LAB — CBC AND DIFFERENTIAL
HEMATOCRIT: 41 % (ref 36–46)
HEMOGLOBIN: 14.2 g/dL (ref 12.0–16.0)
Platelets: 230 10*3/uL (ref 150–399)
WBC: 7.4 10^3/mL

## 2017-05-17 LAB — HEPATIC FUNCTION PANEL
ALK PHOS: 51 U/L (ref 25–125)
ALT: 16 U/L (ref 7–35)
AST: 22 U/L (ref 13–35)
BILIRUBIN, TOTAL: 0.7 mg/dL

## 2017-05-17 LAB — BASIC METABOLIC PANEL
BUN: 24 mg/dL — AB (ref 4–21)
Creatinine: 0.9 mg/dL (ref <=1.1)
Glucose: 97 mg/dL
Potassium: 3.9 mmol/L (ref 3.4–5.3)
SODIUM: 138 mmol/L (ref 137–147)

## 2017-05-17 LAB — HEMOGLOBIN A1C: Hemoglobin A1C: 5.4

## 2017-05-17 LAB — TSH: TSH: 2.07 u[IU]/mL (ref <=5.90)

## 2017-05-18 ENCOUNTER — Other Ambulatory Visit: Payer: Self-pay | Admitting: *Deleted

## 2017-06-20 IMAGING — MR MR HEAD W/O CM
8 of 10 series · 39 of 48 positions shown · non-contrast
Comparison: Brain MRI 01/28/2011

CLINICAL DATA: Visual disturbances. A right-sided neck pain.
Negative neuro exam.

EXAM:
MRI HEAD WITHOUT CONTRAST
TECHNIQUE: Multiplanar, multiecho pulse sequences of the brain and surrounding
structures were obtained without intravenous contrast.

[Series 4: DWI · axial · 3.0mm · 1.09mm/px · z∈[-49,+82]mm · 8 of 90 slices shown (1 of 4)]
[im 1/90]
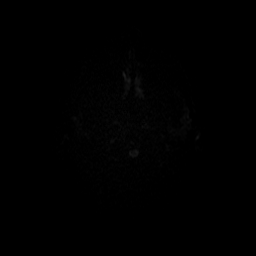
[im 10/90]
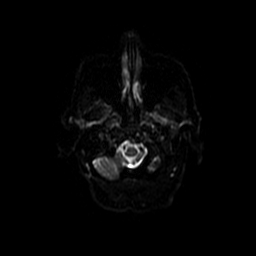
[im 30/90]
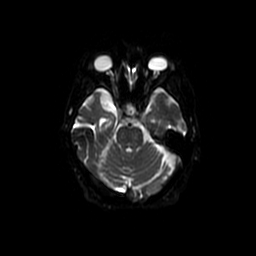
[im 40/90]
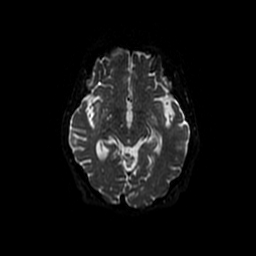
[im 50/90]
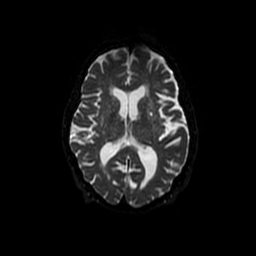
[im 60/90]
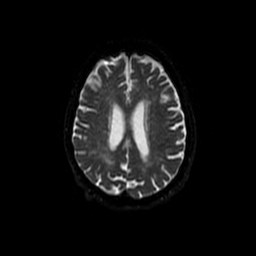
[im 80/90]
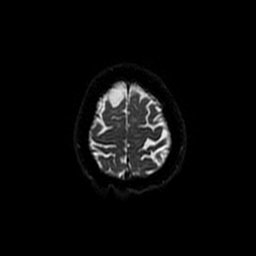
[im 90/90]
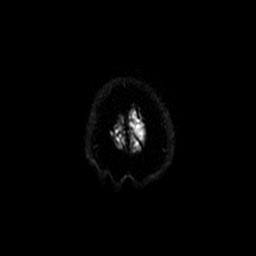

[Series 7: T2 · axial · 5.0mm · 0.43mm/px · z∈[-45,+87]mm · 3 of 23 slices shown (1 of 2)]
[im 1/23]
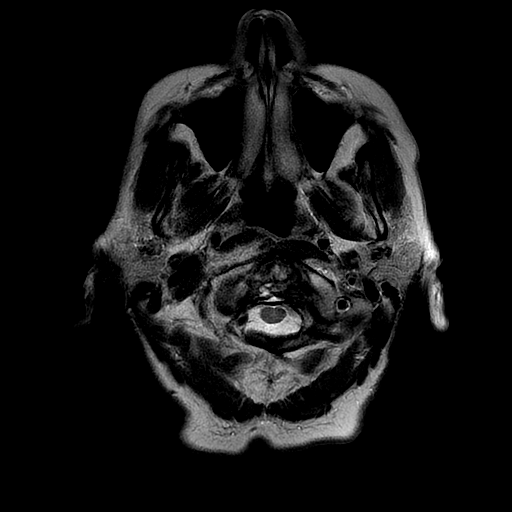
[im 12/23]
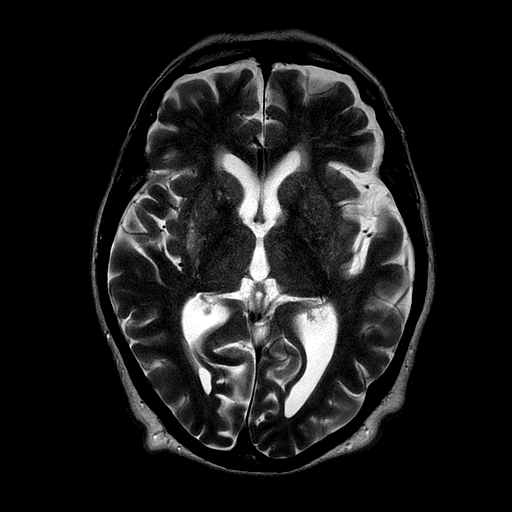
[im 23/23]
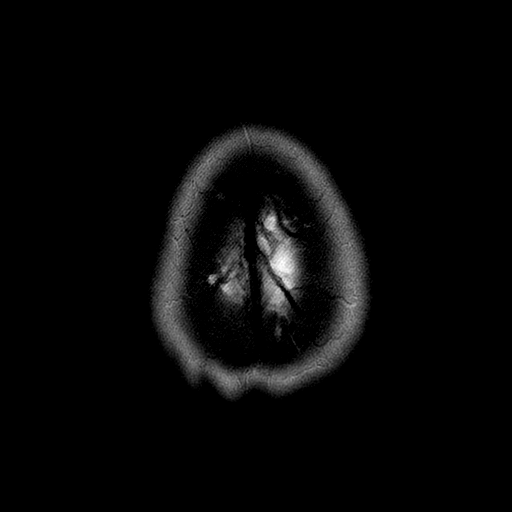

[Series 8: FLAIR · axial · 5.0mm · 0.43mm/px · z∈[-45,+87]mm · 3 of 23 slices shown (1 of 2)]
[im 1/23]
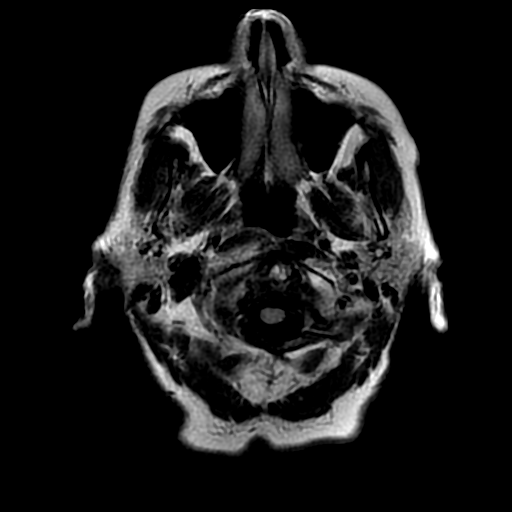
[im 12/23]
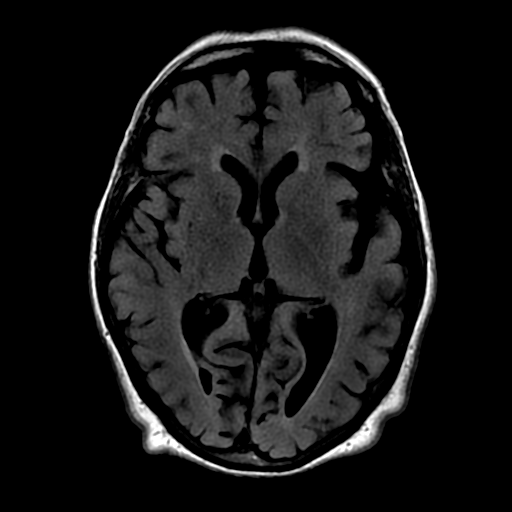
[im 23/23]
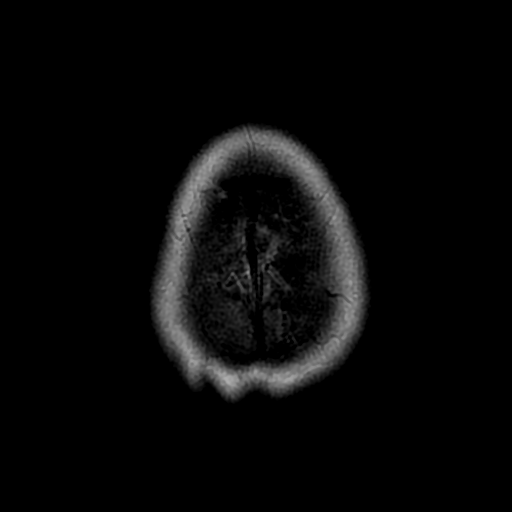

[Series 9: DWI · coronal · 5.0mm · 1.09mm/px · 9 of 74 slices shown (2 of 4)]
[im 1/74]
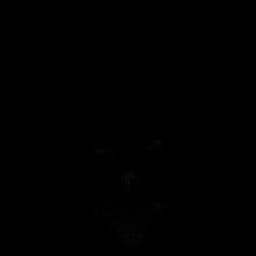
[im 10/74]
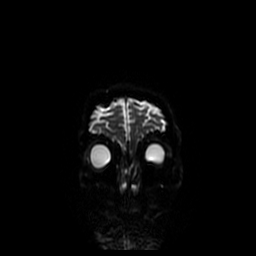
[im 19/74]
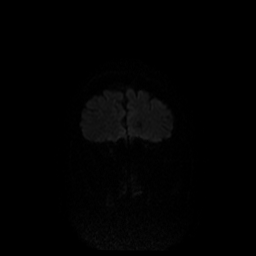
[im 28/74]
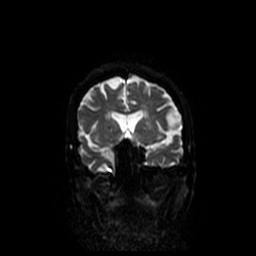
[im 37/74]
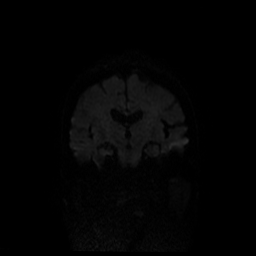
[im 46/74]
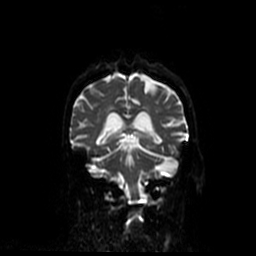
[im 55/74]
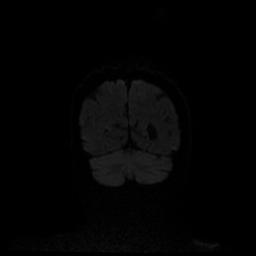
[im 64/74]
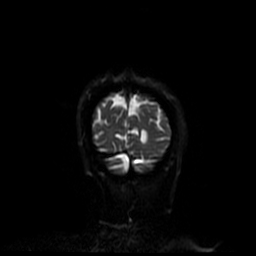
[im 74/74]
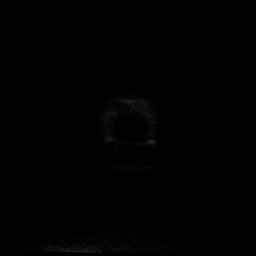

[Series 11: T2 · coronal · 5.0mm · 0.43mm/px · 2 of 31 slices shown (2 of 2)]
[im 1/31]
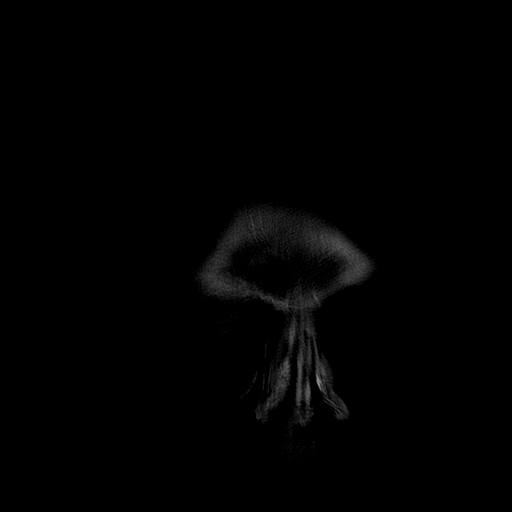
[im 11/31]
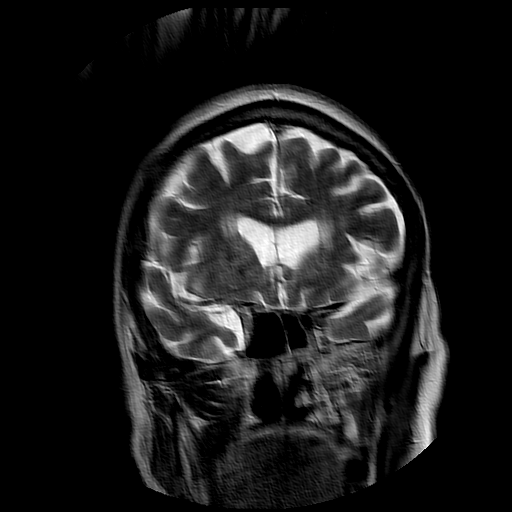

[Series 12: FLAIR · axial · 5.0mm · 0.43mm/px · z∈[-45,+87]mm · 3 of 23 slices shown (2 of 2)]
[im 1/23]
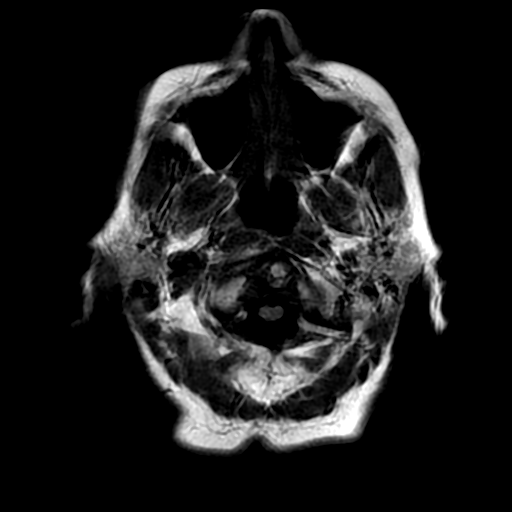
[im 12/23]
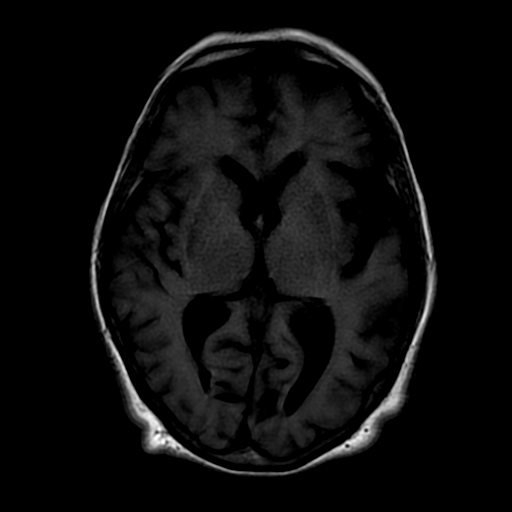
[im 23/23]
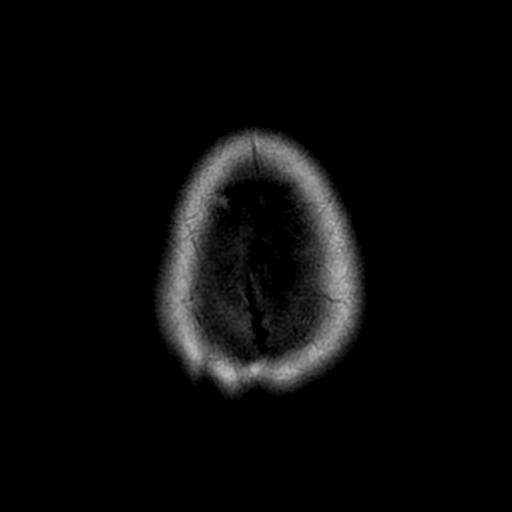

[Series 400: DWI · axial · 3.0mm · 1.09mm/px · z∈[-49,+82]mm · 6 of 45 slices shown (3 of 4)]
[im 1/45]
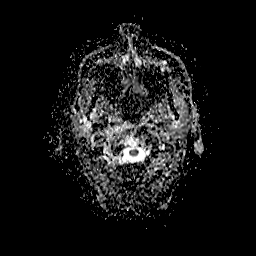
[im 9/45]
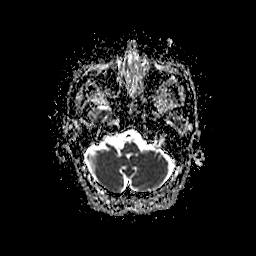
[im 18/45]
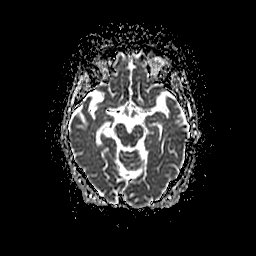
[im 27/45]
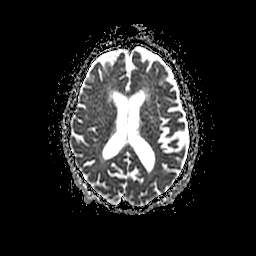
[im 36/45]
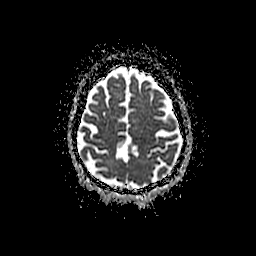
[im 45/45]
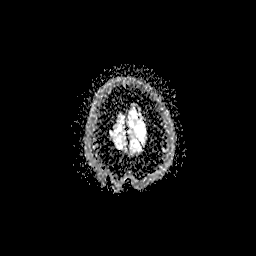

[Series 900: DWI · coronal · 5.0mm · 1.09mm/px · 5 of 37 slices shown (4 of 4)]
[im 1/37]
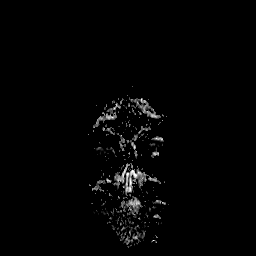
[im 10/37]
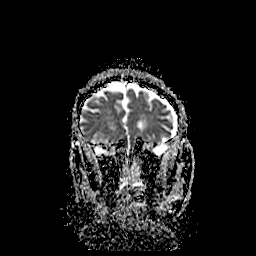
[im 19/37]
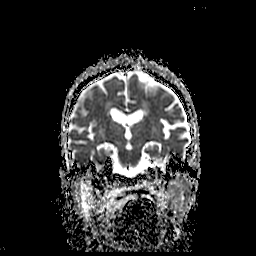
[im 28/37]
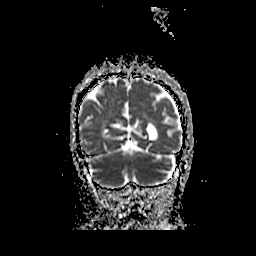
[im 37/37]
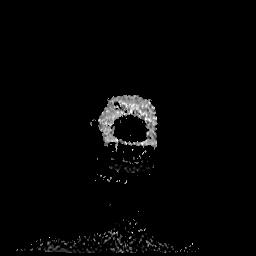

[39 of 48 positions shown; findings below may reference images not displayed]

FINDINGS: Brain Parenchyma: No acute infarct or intraparenchymal hemorrhage.
There is confluent white matter hyperintensity compatible with
chronic microvascular disease. No mass lesion or midline shift. The
major intracranial flow voids are preserved. The midline structures
are normal.

Ventricles, Sulci and Extra-axial Spaces: Normal for age. No
extra-axial collection.

Paranasal Sinuses and Mastoids: No fluid levels or advanced mucosal
thickening.

Orbits: Normal.

Bones and Soft Tissues: The visualized skull base, calvarium and
extracranial soft tissues are normal.
IMPRESSION: 1. No acute intracranial abnormality. No findings to explain the
reported visual symptoms.
2. Findings of chronic microvascular disease.

## 2017-08-11 ENCOUNTER — Encounter: Payer: Self-pay | Admitting: Internal Medicine

## 2017-08-11 ENCOUNTER — Non-Acute Institutional Stay: Payer: PPO | Admitting: Internal Medicine

## 2017-08-11 DIAGNOSIS — E785 Hyperlipidemia, unspecified: Secondary | ICD-10-CM | POA: Diagnosis not present

## 2017-08-11 DIAGNOSIS — I5032 Chronic diastolic (congestive) heart failure: Secondary | ICD-10-CM

## 2017-08-11 DIAGNOSIS — I10 Essential (primary) hypertension: Secondary | ICD-10-CM | POA: Diagnosis not present

## 2017-08-11 DIAGNOSIS — E876 Hypokalemia: Secondary | ICD-10-CM | POA: Diagnosis not present

## 2017-08-11 DIAGNOSIS — M069 Rheumatoid arthritis, unspecified: Secondary | ICD-10-CM

## 2017-08-11 DIAGNOSIS — I480 Paroxysmal atrial fibrillation: Secondary | ICD-10-CM

## 2017-08-11 DIAGNOSIS — J309 Allergic rhinitis, unspecified: Secondary | ICD-10-CM

## 2017-08-11 DIAGNOSIS — F341 Dysthymic disorder: Secondary | ICD-10-CM

## 2017-08-11 DIAGNOSIS — F329 Major depressive disorder, single episode, unspecified: Secondary | ICD-10-CM

## 2017-08-11 NOTE — Progress Notes (Signed)
Location:  [facility] Room Number: 144 Place of Service:  ALF [phone]) Provider:  Blanchie Serve MD  Blanchie Serve, MD  Patient Care Team: Blanchie Serve, MD as PCP - General (Internal Medicine) Mast, Man X, NP as Nurse Practitioner (Internal Medicine) Martinique, Peter M, MD as Consulting Physician (Cardiology) Lavonna Monarch, MD as Consulting Physician (Dermatology) Garald Balding, MD as Consulting Physician (Orthopedic Surgery)  Extended Emergency Contact Information Primary Emergency Contact: Berneta Levins Address: [address] [place], [place] [address] Johnnette Litter of [place] Phone: [phone] Work Phone: [phone] Mobile Phone: [phone] Relation: Daughter Secondary Emergency Contact: Azalee Course States of [place] Phone: [phone] Mobile Phone: [phone] Relation: Son  Code Status:  Full Code Goals of care: Advanced Directive information Advanced Directives 08/11/2017  Does Patient Have a Medical Advance Directive? Yes  Type of Advance Directive Living will  Does patient want to make changes to medical advance directive? No - Patient declined  Copy of [place] in Chart? -  Would patient like information on creating a medical advance directive? -  Pre-existing out of facility DNR order (yellow form or pink MOST form) -     Chief Complaint  Patient presents with  . Medical Management of Chronic Issues    Routine Visit     HPI:  Pt is a 83 y.o. female seen today for medical management of chronic diseases. She resides in assisted living facility. She denies any concern this visit. Her blood pressure is elevated today but on review, overall stable. She denies any palpitation. Her breathing has been stable. Her wound to right leg has healed. Complaint with her medication. Mood has been stable on remeron, she is on chronic prednisone. She is tolerating diuretic and anticoagulation well.     Past Medical History:  Diagnosis Date  . Cervicalgia 08/12/2016  . CHF (congestive heart failure) ([place])   . Chronic diastolic CHF (congestive heart failure), NYHA class 3 ([place]) 06/17/2016  . Complication of anesthesia   . Edema 08/12/2016  . Hypertension   . New onset atrial fibrillation ([place]) 04/2016   a. started on Xarelto and Cardizem  . PONV (postoperative nausea and vomiting)   . RA (rheumatoid arthritis) ([place])   . SOB (shortness of breath) 04/2016   Hospitalist  . Vertigo    Past Surgical History:  Procedure Laterality Date  . Biopsy right breast Right    fibrocystic changes  . CARDIOVERSION N/A 07/08/2016   Procedure: CARDIOVERSION;  Surgeon: Larey Dresser, MD;  Location: [facility];  Service: Cardiovascular;  Laterality: N/A;  . CERVICAL SPINE SURGERY  2005   Dr. Vertell Limber  . HYSTEROSCOPY  2001  . JOINT REPLACEMENT    . REPLACEMENT TOTAL KNEE Right 1996   [place] Bilateral [id number]   Baird Lyons MD    Allergies  Allergen Reactions  . Morphine Sulfate Other (See Comments)    "extremely nauseated"  . Other Nausea And Vomiting    Pt states she is very sensitive to pain medications.    Outpatient Encounter Prescriptions as of 08/11/2017  Medication Sig  . acetaminophen (TYLENOL) 325 MG tablet Take 650 mg by mouth every 6 (six) hours as needed for mild pain.  Marland Kitchen amoxicillin (AMOXIL) 500 MG capsule Take 2,000 mg by mouth See admin instructions. 1 hour prior to dental procedures  . Calcium Carbonate (CALCIUM 600 PO) Take 600-900 mg by  mouth. Take one tablet 600 mg in morning, take 1 1/2 tablets in evening for calcium   . dextromethorphan-guaiFENesin (MUCINEX DM) 30-600 MG 12hr tablet Take 1 tablet by mouth 2 (two) times daily as needed for cough.  . Diclofenac Sodium (PENNSAID) 2 % SOLN Place onto the skin. Apply sparingly topical to right side of neck as needed twice a day  . diltiazem (CARDIZEM CD) 120 MG 24 hr capsule Take 1  capsule (120 mg total) by mouth daily.  Marland Kitchen docusate sodium (COLACE) 100 MG capsule Take 200 mg by mouth every evening.  . enalapril (VASOTEC) 20 MG tablet Take 20 mg by mouth 2 (two) times daily.    . furosemide (LASIX) 40 MG tablet Take 40 mg by mouth daily.   Marland Kitchen loratadine (CLARITIN) 10 MG tablet Take 10 mg by mouth daily.  . Magnesium 250 MG TABS Take 250 mg by mouth daily.    . metoprolol succinate (TOPROL-XL) 100 MG 24 hr tablet Take one tablet daily for blood pressure  . metoprolol succinate (TOPROL-XL) 50 MG 24 hr tablet Take 50 mg by mouth daily.   . mirtazapine (REMERON) 15 MG tablet Take one tablet at bedtime  . OXYGEN Inhale 2 L into the lungs daily as needed (shortness of breath). Use 2 liter as needed for SOB, 02 sat < 90% on room air   . potassium chloride SA (K-DUR,KLOR-CON) 20 MEQ tablet Take 20 mEq by mouth daily.   . predniSONE (DELTASONE) 1 MG tablet Take 4 mg by mouth daily with breakfast.  . Rivaroxaban (XARELTO) 15 MG TABS tablet Take 1 tablet (15 mg total) by mouth daily with supper.  . vitamin C (ASCORBIC ACID) 500 MG tablet Take 500 mg by mouth daily.    . Wheat Dextrin (BENEFIBER PO) Take 4 scoop by mouth daily.   . [DISCONTINUED] cephALEXin (KEFLEX) 500 MG capsule Take 500 mg by mouth 3 (three) times daily.  . [DISCONTINUED] dextromethorphan-guaiFENesin (MUCINEX DM) 30-600 MG 12hr tablet One twice daily to suppress cough and congestion   No facility-administered encounter medications on file as of 08/11/2017.     Review of Systems  Constitutional: Negative for chills, fatigue and fever.  HENT: Positive for hearing loss. Negative for congestion, mouth sores, nosebleeds, rhinorrhea, sinus pressure, sore throat and trouble swallowing.   Eyes: Positive for visual disturbance.       Has corrective glasses  Respiratory: Negative for cough, shortness of breath and wheezing.   Cardiovascular: Positive for leg swelling. Negative for chest pain and palpitations.    Gastrointestinal: Negative for abdominal pain, blood in stool, nausea and vomiting.       Had bowel movement this am  Genitourinary: Negative for dysuria, frequency and hematuria.  Musculoskeletal: Positive for arthralgias and gait problem.       Uses walker and wheelchair, no fall reported  Skin: Negative for rash and wound.  Neurological: Negative for dizziness, tremors, seizures, light-headedness, numbness and headaches.  Psychiatric/Behavioral: Negative for behavioral problems and confusion.    Immunization History  Administered Date(s) Administered  . Influenza-Unspecified 09/13/2015, 09/29/2016  . Pneumococcal-Unspecified 05/14/2015  . Tdap 04/13/2011   Pertinent  Health Maintenance Due  Topic Date Due  . PNA vac Low Risk Adult (2 of 2 - PCV13) 05/13/2016  . INFLUENZA VACCINE  07/13/2017  . DEXA SCAN  Completed   Fall Risk  08/12/2016  Falls in the past year? No   Functional Status Survey:    Vitals:   08/11/17 1422  BP: Marland Kitchen)  158/90  Pulse: 79  Resp: 20  Temp: 98.1 F (36.7 C)  TempSrc: Oral  Weight: 160 lb 12.8 oz (72.9 kg)  Height: 5\' 1"  (1.549 m)   Body mass index is 30.38 kg/m. Physical Exam  Constitutional: She is oriented to person, place, and time. She appears well-developed. No distress.  Obese female patient  HENT:  Head: Normocephalic and atraumatic.  Mouth/Throat: Oropharynx is clear and moist.  Eyes: Pupils are equal, round, and reactive to light. Conjunctivae and EOM are normal.  Has corrective glasses  Neck: Normal range of motion. Neck supple.  Cardiovascular:  No murmur heard. Irregular heart rate  Pulmonary/Chest: Effort normal and breath sounds normal. No respiratory distress. She has no wheezes. She has no rales.  Abdominal: Soft. Bowel sounds are normal. There is no tenderness. There is no guarding.  Musculoskeletal: She exhibits edema.  Can move all 4 extremities, has RA changes, has crepitus to both shoulders with limited ROM   Lymphadenopathy:    She has no cervical adenopathy.  Neurological: She is alert and oriented to person, place, and time.  Skin: Skin is warm and dry. No rash noted. She is not diaphoretic.  Psychiatric: She has a normal mood and affect. Her behavior is normal.    Labs reviewed:  Recent Labs  08/12/16 1932  10/14/16 11/11/16 05/17/17  NA 133*  < > 138 137 138  K 3.7  < > 4.5 4.3 3.9  CL 98*  --   --   --   --   CO2 23  --   --   --   --   GLUCOSE 109*  --   --   --   --   BUN 13  < > 19 22* 24*  CREATININE 0.86  < > 0.8 1.0 0.9  CALCIUM 9.5  --   --   --   --   < > = values in this interval not displayed.  Recent Labs  08/12/16 1932  09/23/16 11/11/16 05/17/17  AST 25  < > 21 17 22   ALT 16  < > 18 10 16   ALKPHOS 45  < > 47 51 51  BILITOT 0.6  --   --   --   --   PROT 5.7*  --   --   --   --   ALBUMIN 3.4*  --   --   --   --   < > = values in this interval not displayed.  Recent Labs  08/12/16 1932 09/02/16 09/23/16 05/17/17  WBC 7.3 6.2 5.3 7.4  NEUTROABS 4.3  --   --   --   HGB 13.8 13.5 13.6 14.2  HCT 43.0 40 40 41  MCV 94.7  --   --   --   PLT 214 202 201 230   Lab Results  Component Value Date   TSH 2.07 05/17/2017   Lab Results  Component Value Date   HGBA1C 5.4 05/17/2017   Lab Results  Component Value Date   CHOL 141 11/11/2016   HDL 74 (A) 11/11/2016   LDLCALC 50 11/11/2016   TRIG 87 11/11/2016   CHOLHDL 2.4 05/04/2016    Significant Diagnostic Results in last 30 days:  No results found.  Assessment/Plan  Chronic afib Controlled HR, currently on metoprolol succinate 100 mg in am and 50 mg in pm with diltiazem 120 mg daily for rate control. Continue rivaroxaban 15 mg daily for stroke prophylaxis.   Chronic diastolic CHF  Apply ted hose to her legs. Continue metoprolol succinate, enalapril  and furosemide. Continue kcl supplement  Hypertension Monitor BP reading. Continue metoprolol succinate with her enalapril.   RA Continue prednisone  chronically 4 mg daily, continue calcium supplement  Hyperlipidemia Check lipid panel, not on statin  Chronic depression Currently on mirtazapine 15 mg daily  Hypokalemia With her on furosemide, continue kcl supplement, reviewed bmp  Allergic rhinitis Continue loratadine 10 mg daily and monitor   Family/ staff Communication: reviewed care plan with patient and charge nurse.    Labs/tests ordered:  Lipid panel   Blanchie Serve, MD Internal Medicine [facility] [address] [place],  [address] Cell Phone (Monday-Friday 8 am - 5 pm): [phone] On Call: [phone] and follow prompts after 5 pm and on weekends Office Phone: [phone] Office Fax: [phone]

## 2017-08-16 DIAGNOSIS — I10 Essential (primary) hypertension: Secondary | ICD-10-CM | POA: Diagnosis not present

## 2017-08-16 DIAGNOSIS — G458 Other transient cerebral ischemic attacks and related syndromes: Secondary | ICD-10-CM | POA: Diagnosis not present

## 2017-08-16 LAB — LIPID PANEL
Cholesterol: 171 (ref 0–200)
HDL: 70 (ref 35–70)
LDL CALC: 74
Triglycerides: 160 (ref 40–160)

## 2017-08-17 ENCOUNTER — Other Ambulatory Visit: Payer: Self-pay | Admitting: *Deleted

## 2017-08-17 ENCOUNTER — Encounter: Payer: Self-pay | Admitting: Nurse Practitioner

## 2017-08-22 ENCOUNTER — Non-Acute Institutional Stay: Payer: PPO | Admitting: Nurse Practitioner

## 2017-08-22 ENCOUNTER — Encounter: Payer: Self-pay | Admitting: Nurse Practitioner

## 2017-08-22 DIAGNOSIS — F418 Other specified anxiety disorders: Secondary | ICD-10-CM

## 2017-08-22 NOTE — Assessment & Plan Note (Signed)
Her mood is stable, will decrease Mirtazapine to 7.5mg  po qhs, observe for returning of targeted symptoms.

## 2017-08-22 NOTE — Progress Notes (Signed)
Location:  [facility] Room Number: 195 Place of Service:  SNF (31) Provider:  Mast, Manxie  NP  Blanchie Serve, MD  Patient Care Team: Blanchie Serve, MD as PCP - General (Internal Medicine) Mast, Man X, NP as Nurse Practitioner (Internal Medicine) [place], Peter M, MD as Consulting Physician (Cardiology) Lavonna Monarch, MD as Consulting Physician (Dermatology) Garald Balding, MD as Consulting Physician (Orthopedic Surgery)  Extended Emergency Contact Information Primary Emergency Contact: Berneta Levins Address: [address] [place], [place] [address] Johnnette Litter of [place] Phone: [phone] Work Phone: [phone] Mobile Phone: [phone] Relation: Daughter Secondary Emergency Contact: Azalee Course States of [place] Phone: [phone] Mobile Phone: [phone] Relation: Son  Code Status:  DNR Goals of care: Advanced Directive information Advanced Directives 08/22/2017  Does Patient Have a Medical Advance Directive? Yes  Type of Advance Directive Living will;Out of facility DNR (pink MOST or yellow form)  Does patient want to make changes to medical advance directive? No - Patient declined  Copy of [place] in Chart? -  Would patient like information on creating a medical advance directive? -  Pre-existing out of facility DNR order (yellow form or pink MOST form) Yellow form placed in chart (order not valid for inpatient use)     Chief Complaint  Patient presents with  . Acute Visit    medication adjustment    HPI:  Pt is a 83 y.o. female seen today for an acute visit for the patient requested to evaluate her depression and Mirtazapine, she stated she sleeps and eats well, not feeling depressed. She has been taking Mirtazapine 15mg  daily.      Past Medical History:  Diagnosis Date  . Cervicalgia 08/12/2016  . CHF (congestive heart failure) ([place])   . Chronic diastolic CHF  (congestive heart failure), NYHA class 3 ([place]) 06/17/2016  . Complication of anesthesia   . Edema 08/12/2016  . Hypertension   . New onset atrial fibrillation ([place]) 04/2016   a. started on Xarelto and Cardizem  . PONV (postoperative nausea and vomiting)   . RA (rheumatoid arthritis) ([place])   . SOB (shortness of breath) 04/2016   Hospitalist  . Vertigo    Past Surgical History:  Procedure Laterality Date  . Biopsy right breast Right    fibrocystic changes  . CARDIOVERSION N/A 07/08/2016   Procedure: CARDIOVERSION;  Surgeon: Larey Dresser, MD;  Location: [facility];  Service: Cardiovascular;  Laterality: N/A;  . CERVICAL SPINE SURGERY  2005   Dr. Vertell Limber  . HYSTEROSCOPY  2001  . JOINT REPLACEMENT    . REPLACEMENT TOTAL KNEE Right 1996   [place] Bilateral [id number]   Baird Lyons MD    Allergies  Allergen Reactions  . Morphine Sulfate Other (See Comments)    "extremely nauseated"  . Other Nausea And Vomiting    Pt states she is very sensitive to pain medications.    Outpatient Encounter Prescriptions as of 08/22/2017  Medication Sig  . acetaminophen (TYLENOL) 325 MG tablet Take 650 mg by mouth every 6 (six) hours as needed for mild pain.  Marland Kitchen amoxicillin (AMOXIL) 500 MG capsule Take 2,000 mg by mouth See admin instructions. 1 hour prior to dental procedures  . Calcium Carbonate (CALCIUM 600 PO) Take 600-900 mg by mouth. Take one tablet 600 mg in morning, take 1 1/2 tablets in evening for calcium   . dextromethorphan-guaiFENesin (  MUCINEX DM) 30-600 MG 12hr tablet Take 1 tablet by mouth 2 (two) times daily as needed for cough.  . Diclofenac Sodium (PENNSAID) 2 % SOLN Place onto the skin. Apply sparingly topical to right side of neck as needed twice a day  . diltiazem (CARDIZEM CD) 120 MG 24 hr capsule Take 1 capsule (120 mg total) by mouth daily.  Marland Kitchen docusate sodium (COLACE) 100 MG capsule Take 200 mg by mouth every evening.  . enalapril (VASOTEC)  20 MG tablet Take 20 mg by mouth 2 (two) times daily.    . furosemide (LASIX) 40 MG tablet Take 40 mg by mouth daily.   Marland Kitchen loratadine (CLARITIN) 10 MG tablet Take 10 mg by mouth daily.  . metoprolol succinate (TOPROL-XL) 100 MG 24 hr tablet Take one tablet daily for blood pressure  . metoprolol succinate (TOPROL-XL) 50 MG 24 hr tablet Take 50 mg by mouth daily.   . mirtazapine (REMERON) 15 MG tablet Take one tablet at bedtime  . OXYGEN Inhale 2 L into the lungs daily as needed (shortness of breath). Use 2 liter as needed for SOB, 02 sat < 90% on room air   . potassium chloride SA (K-DUR,KLOR-CON) 20 MEQ tablet Take 20 mEq by mouth daily.   . predniSONE (DELTASONE) 1 MG tablet Take 4 mg by mouth daily with breakfast.  . vitamin C (ASCORBIC ACID) 500 MG tablet Take 500 mg by mouth daily.    . Wheat Dextrin (BENEFIBER PO) Take 4 scoop by mouth daily.   . [DISCONTINUED] Magnesium 250 MG TABS Take 250 mg by mouth daily.    . [DISCONTINUED] Rivaroxaban (XARELTO) 15 MG TABS tablet Take 1 tablet (15 mg total) by mouth daily with supper.   No facility-administered encounter medications on file as of 08/22/2017.     Review of Systems  Constitutional: Negative for activity change, appetite change, chills, diaphoresis, fatigue and fever.  Psychiatric/Behavioral: Negative for agitation, behavioral problems, confusion, dysphoric mood, hallucinations and sleep disturbance. The patient is not nervous/anxious.     Immunization History  Administered Date(s) Administered  . Influenza-Unspecified 09/13/2015, 09/29/2016  . Pneumococcal-Unspecified 05/14/2015  . Tdap 04/13/2011   Pertinent  Health Maintenance Due  Topic Date Due  . PNA vac Low Risk Adult (2 of 2 - PCV13) 05/13/2016  . INFLUENZA VACCINE  07/13/2017  . DEXA SCAN  Completed   Fall Risk  08/12/2016  Falls in the past year? No   Functional Status Survey:    Vitals:   08/22/17 1406  BP: 132/74  Pulse: 80  Resp: 18  Temp: 97.6 F (36.4  C)  Weight: 160 lb 12.8 oz (72.9 kg)  Height: 5\' 1"  (1.549 m)   Body mass index is 30.38 kg/m. Physical Exam  Constitutional: She is oriented to person, place, and time. She appears well-developed and well-nourished. No distress.  Neurological: She is alert and oriented to person, place, and time. No cranial nerve deficit. She exhibits normal muscle tone. Coordination normal.  Skin: She is not diaphoretic.  Psychiatric: She has a normal mood and affect. Her behavior is normal. Judgment and thought content normal.    Labs reviewed:  Recent Labs  10/14/16 11/11/16 05/17/17  NA 138 137 138  K 4.5 4.3 3.9  BUN 19 22* 24*  CREATININE 0.8 1.0 0.9    Recent Labs  09/23/16 11/11/16 05/17/17  AST 21 17 22   ALT 18 10 16   ALKPHOS 47 51 51    Recent Labs  09/02/16 09/23/16 05/17/17  WBC 6.2 5.3 7.4  HGB 13.5 13.6 14.2  HCT 40 40 41  PLT 202 201 230   Lab Results  Component Value Date   TSH 2.07 05/17/2017   Lab Results  Component Value Date   HGBA1C 5.4 05/17/2017   Lab Results  Component Value Date   CHOL 171 08/16/2017   HDL 70 08/16/2017   LDLCALC 74 08/16/2017   TRIG 160 08/16/2017   CHOLHDL 2.4 05/04/2016    Significant Diagnostic Results in last 30 days:  No results found.  Assessment/Plan Depression with anxiety Her mood is stable, will decrease Mirtazapine to 7.5mg  po qhs, observe for returning of targeted symptoms.      Family/ staff Communication: plan of care reviewed with the patient and charge nurse  Labs/tests ordered:  none  Time spend 15 minutes.

## 2017-08-23 ENCOUNTER — Non-Acute Institutional Stay: Payer: PPO

## 2017-08-23 DIAGNOSIS — Z0000 Encounter for general adult medical examination without abnormal findings: Secondary | ICD-10-CM

## 2017-08-23 NOTE — Progress Notes (Signed)
Subjective:   [name] is a 83 y.o. female who presents for Medicare Annual (Subsequent) preventive examination at [place]    Objective:     Vitals: BP 136/64 (BP Location: Left Arm, Patient Position: Sitting)   Pulse 79   Temp 97.9 F (36.6 C) (Oral)   Ht 5\' 1"  (1.549 m)   Wt 161 lb (73 kg)   SpO2 96%   BMI 30.42 kg/m   Body mass index is 30.42 kg/m.   Tobacco History  Smoking Status  . Former Smoker  . Quit date: 12/13/1966  Smokeless Tobacco  . Never Used     Counseling given: Not Answered   Past Medical History:  Diagnosis Date  . Cervicalgia 08/12/2016  . CHF (congestive heart failure) ([place])   . Chronic diastolic CHF (congestive heart failure), NYHA class 3 ([place]) 06/17/2016  . Complication of anesthesia   . Edema 08/12/2016  . Hypertension   . New onset atrial fibrillation ([place]) 04/2016   a. started on Xarelto and Cardizem  . PONV (postoperative nausea and vomiting)   . RA (rheumatoid arthritis) ([place])   . SOB (shortness of breath) 04/2016   Hospitalist  . Vertigo    Past Surgical History:  Procedure Laterality Date  . Biopsy right breast Right    fibrocystic changes  . CARDIOVERSION N/A 07/08/2016   Procedure: CARDIOVERSION;  Surgeon: Larey Dresser, MD;  Location: [facility];  Service: Cardiovascular;  Laterality: N/A;  . CERVICAL SPINE SURGERY  2005   Dr. Vertell Limber  . HYSTEROSCOPY  2001  . JOINT REPLACEMENT    . REPLACEMENT TOTAL [place] Bilateral 2005,2008,2009   Baird Lyons MD   Family History  Problem Relation Age of Onset  . Cancer Sister        brain  . Arthritis Daughter   . Hypothyroidism Daughter   . Hypertension Son   . Arthritis Son    History  Sexual Activity  . Sexual activity: No    Outpatient Encounter Prescriptions as of 08/23/2017  Medication Sig  . acetaminophen (TYLENOL) 325 MG tablet Take 650 mg by mouth every 6 (six) hours  as needed for mild pain.  Marland Kitchen amoxicillin (AMOXIL) 500 MG capsule Take 2,000 mg by mouth See admin instructions. 1 hour prior to dental procedures  . Calcium Carbonate (CALCIUM 600 PO) Take 600-900 mg by mouth. Take one tablet 600 mg in morning, take 1 1/2 tablets in evening for calcium   . dextromethorphan-guaiFENesin (MUCINEX DM) 30-600 MG 12hr tablet Take 1 tablet by mouth 2 (two) times daily as needed for cough.  . Diclofenac Sodium (PENNSAID) 2 % SOLN Place onto the skin. Apply sparingly topical to right side of neck as needed twice a day  . diltiazem (CARDIZEM CD) 120 MG 24 hr capsule Take 1 capsule (120 mg total) by mouth daily.  Marland Kitchen docusate sodium (COLACE) 100 MG capsule Take 200 mg by mouth every evening.  . enalapril (VASOTEC) 20 MG tablet Take 20 mg by mouth 2 (two) times daily.    . furosemide (LASIX) 40 MG tablet Take 40 mg by mouth daily.   Marland Kitchen loratadine (CLARITIN) 10 MG tablet Take 10 mg by mouth daily.  . Magnesium Oxide 250 MG TABS Take 250 mg by mouth daily.  . metoprolol succinate (TOPROL-XL) 100 MG 24 hr tablet Take one tablet daily for blood pressure  . metoprolol succinate (TOPROL-XL) 50 MG 24  hr tablet Take 50 mg by mouth daily.   . mirtazapine (REMERON) 15 MG tablet Take one tablet at bedtime  . OXYGEN Inhale 2 L into the lungs daily as needed (shortness of breath). Use 2 liter as needed for SOB, 02 sat < 90% on room air   . potassium chloride SA (K-DUR,KLOR-CON) 20 MEQ tablet Take 20 mEq by mouth daily.   . predniSONE (DELTASONE) 1 MG tablet Take 4 mg by mouth daily with breakfast.  . Rivaroxaban (XARELTO) 15 MG TABS tablet Take 15 mg by mouth daily with supper.  . vitamin C (ASCORBIC ACID) 500 MG tablet Take 500 mg by mouth daily.    . Wheat Dextrin (BENEFIBER PO) Take 4 scoop by mouth daily.    No facility-administered encounter medications on file as of 08/23/2017.     Activities of Daily Living In your present state of health, do you have any difficulty performing the  following activities: 08/23/2017  Hearing? N  Vision? N  Difficulty concentrating or making decisions? N  Walking or climbing stairs? Y  Dressing or bathing? Y  Doing errands, shopping? Y  Preparing Food and eating ? Y  Using the Toilet? Y  In the past six months, have you accidently leaked urine? Y  Do you have problems with loss of bowel control? N  Managing your Medications? Y  Managing your Finances? Y  Housekeeping or managing your Housekeeping? Y  Some recent data might be hidden    Patient Care Team: Blanchie Serve, MD as PCP - General (Internal Medicine) Mast, Man X, NP as Nurse Practitioner (Internal Medicine) Martinique, Peter M, MD as Consulting Physician (Cardiology) Lavonna Monarch, MD as Consulting Physician (Dermatology) Garald Balding, MD as Consulting Physician (Orthopedic Surgery)    Assessment:     Exercise Activities and Dietary recommendations Current Exercise Habits: The patient does not participate in regular exercise at present, Exercise limited by: orthopedic condition(s)  Goals    None     Fall Risk Fall Risk  08/23/2017 08/12/2016  Falls in the past year? No No   Depression Screen PHQ 2/9 Scores 08/23/2017 08/12/2016 06/20/2015  PHQ - 2 Score 0 0 0     Cognitive Function MMSE - Mini Mental State Exam 08/23/2017  Orientation to time 5  Orientation to Place 5  Registration 3  Attention/ Calculation 5  Recall 3  Language- name 2 objects 2  Language- repeat 1  Language- follow 3 step command 3  Language- read & follow direction 1  Write a sentence 1  Copy design 1  Total score 30        Immunization History  Administered Date(s) Administered  . Influenza-Unspecified 09/13/2015, 09/29/2016  . Pneumococcal-Unspecified 05/14/2015  . Tdap 04/13/2011   Screening Tests Health Maintenance  Topic Date Due  . PNA vac Low Risk Adult (2 of 2 - PCV13) 05/13/2016  . INFLUENZA VACCINE  07/13/2017  . TETANUS/TDAP  04/12/2021  . DEXA SCAN   Completed      Plan:    I have personally reviewed and addressed the Medicare Annual Wellness questionnaire and have noted the following in the patient's chart:  A. Medical and social history B. Use of alcohol, tobacco or illicit drugs  C. Current medications and supplements D. Functional ability and status E.  Nutritional status F.  Physical activity G. Advance directives H. List of other physicians I.  Hospitalizations, surgeries, and ER visits in previous 12 months J.  Vitals K. Screenings to include hearing,  vision, cognitive, depression L. Referrals and appointments - none  In addition, I have reviewed and discussed with patient certain preventive protocols, quality metrics, and best practice recommendations. A written personalized care plan for preventive services as well as general preventive health recommendations were provided to patient.  See attached scanned questionnaire for additional information.   Signed,   Rich Reining, RN Nurse Health Advisor   Quick Notes   Health Maintenance: [phone] due. Flu vaccine due when facility gets them in stock     Abnormal Screen: MMSE 30/30. Passed clock drawing     Patient Concerns: None     Nurse Concerns: None

## 2017-08-23 NOTE — Patient Instructions (Signed)
[name] , Thank you for taking time to come for your Medicare Wellness Visit. I appreciate your ongoing commitment to your health goals. Please review the following plan we discussed and let me know if I can assist you in the future.   Screening recommendations/referrals: Colonoscopy excluded pt over age 81 Mammogram excluded pt over age 70 Bone Density up to date Recommended yearly ophthalmology/optometry visit for glaucoma screening and checkup Recommended yearly dental visit for hygiene and checkup  Vaccinations: Influenza vaccine due Pneumococcal vaccine 13 due Tdap vaccine up to date. Due 04/12/2021 Shingles vaccine not in records    Advanced directives: In Chart  Conditions/risks identified: None  Next appointment: Dr. Bubba Camp makes rounds   Preventive Care 57 Years and Older, Female Preventive care refers to lifestyle choices and visits with your health care provider that can promote health and wellness. What does preventive care include?  A yearly physical exam. This is also called an annual well check.  Dental exams once or twice a year.  Routine eye exams. Ask your health care provider how often you should have your eyes checked.  Personal lifestyle choices, including:  Daily care of your teeth and gums.  Regular physical activity.  Eating a healthy diet.  Avoiding tobacco and drug use.  Limiting alcohol use.  Practicing safe sex.  Taking low-dose aspirin every day.  Taking vitamin and mineral supplements as recommended by your health care provider. What happens during an annual well check? The services and screenings done by your health care provider during your annual well check will depend on your age, overall health, lifestyle risk factors, and family history of disease. Counseling  Your health care provider may ask you questions about your:  Alcohol use.  Tobacco use.  Drug use.  Emotional well-being.  Home and relationship  well-being.  Sexual activity.  Eating habits.  History of falls.  Memory and ability to understand (cognition).  Work and work Statistician.  Reproductive health. Screening  You may have the following tests or measurements:  Height, weight, and BMI.  Blood pressure.  Lipid and cholesterol levels. These may be checked every 5 years, or more frequently if you are over 49 years old.  Skin check.  Lung cancer screening. You may have this screening every year starting at age 3 if you have a 30-pack-year history of smoking and currently smoke or have quit within the past 15 years.  Fecal occult blood test (FOBT) of the stool. You may have this test every year starting at age 83.  Flexible sigmoidoscopy or colonoscopy. You may have a sigmoidoscopy every 5 years or a colonoscopy every 10 years starting at age 69.  Hepatitis C blood test.  Hepatitis B blood test.  Sexually transmitted disease (STD) testing.  Diabetes screening. This is done by checking your blood sugar (glucose) after you have not eaten for a while (fasting). You may have this done every 1-3 years.  Bone density scan. This is done to screen for osteoporosis. You may have this done starting at age 13.  Mammogram. This may be done every 1-2 years. Talk to your health care provider about how often you should have regular mammograms. Talk with your health care provider about your test results, treatment options, and if necessary, the need for more tests. Vaccines  Your health care provider may recommend certain vaccines, such as:  Influenza vaccine. This is recommended every year.  Tetanus, diphtheria, and acellular pertussis (Tdap, Td) vaccine. You may need a Td  booster every 10 years.  Zoster vaccine. You may need this after age 55.  Pneumococcal 13-valent conjugate (PCV13) vaccine. One dose is recommended after age 47.  Pneumococcal polysaccharide (PPSV23) vaccine. One dose is recommended after age  39. Talk to your health care provider about which screenings and vaccines you need and how often you need them. This information is not intended to replace advice given to you by your health care provider. Make sure you discuss any questions you have with your health care provider. Document Released: 12/26/2015 Document Revised: 08/18/2016 Document Reviewed: 09/30/2015 Elsevier Interactive Patient Education  2017 [place] Prevention in the Home Falls can cause injuries. They can happen to people of all ages. There are many things you can do to make your home safe and to help prevent falls. What can I do on the outside of my home?  Regularly fix the edges of walkways and driveways and fix any cracks.  Remove anything that might make you trip as you walk through a door, such as a raised step or threshold.  Trim any bushes or trees on the path to your home.  Use bright outdoor lighting.  Clear any walking paths of anything that might make someone trip, such as rocks or tools.  Regularly check to see if handrails are loose or broken. Make sure that both sides of any steps have handrails.  Any raised decks and porches should have guardrails on the edges.  Have any leaves, snow, or ice cleared regularly.  Use sand or salt on walking paths during winter.  Clean up any spills in your garage right away. This includes oil or grease spills. What can I do in the bathroom?  Use night lights.  Install grab bars by the toilet and in the tub and shower. Do not use towel bars as grab bars.  Use non-skid mats or decals in the tub or shower.  If you need to sit down in the shower, use a plastic, non-slip stool.  Keep the floor dry. Clean up any water that spills on the floor as soon as it happens.  Remove soap buildup in the tub or shower regularly.  Attach bath mats securely with double-sided non-slip rug tape.  Do not have throw rugs and other things on the floor that can make  you trip. What can I do in the bedroom?  Use night lights.  Make sure that you have a light by your bed that is easy to reach.  Do not use any sheets or blankets that are too big for your bed. They should not hang down onto the floor.  Have a firm chair that has side arms. You can use this for support while you get dressed.  Do not have throw rugs and other things on the floor that can make you trip. What can I do in the kitchen?  Clean up any spills right away.  Avoid walking on wet floors.  Keep items that you use a lot in easy-to-reach places.  If you need to reach something above you, use a strong step stool that has a grab bar.  Keep electrical cords out of the way.  Do not use floor polish or wax that makes floors slippery. If you must use wax, use non-skid floor wax.  Do not have throw rugs and other things on the floor that can make you trip. What can I do with my stairs?  Do not leave any items on the stairs.  Make sure that there are handrails on both sides of the stairs and use them. Fix handrails that are broken or loose. Make sure that handrails are as long as the stairways.  Check any carpeting to make sure that it is firmly attached to the stairs. Fix any carpet that is loose or worn.  Avoid having throw rugs at the top or bottom of the stairs. If you do have throw rugs, attach them to the floor with carpet tape.  Make sure that you have a light switch at the top of the stairs and the bottom of the stairs. If you do not have them, ask someone to add them for you. What else can I do to help prevent falls?  Wear shoes that:  Do not have high heels.  Have rubber bottoms.  Are comfortable and fit you well.  Are closed at the toe. Do not wear sandals.  If you use a stepladder:  Make sure that it is fully opened. Do not climb a closed stepladder.  Make sure that both sides of the stepladder are locked into place.  Ask someone to hold it for you, if  possible.  Clearly mark and make sure that you can see:  Any grab bars or handrails.  First and last steps.  Where the edge of each step is.  Use tools that help you move around (mobility aids) if they are needed. These include:  Canes.  Walkers.  Scooters.  Crutches.  Turn on the lights when you go into a dark area. Replace any light bulbs as soon as they burn out.  Set up your furniture so you have a clear path. Avoid moving your furniture around.  If any of your floors are uneven, fix them.  If there are any pets around you, be aware of where they are.  Review your medicines with your doctor. Some medicines can make you feel dizzy. This can increase your chance of falling. Ask your doctor what other things that you can do to help prevent falls. This information is not intended to replace advice given to you by your health care provider. Make sure you discuss any questions you have with your health care provider. Document Released: 09/25/2009 Document Revised: 05/06/2016 Document Reviewed: 01/03/2015 Elsevier Interactive Patient Education  2017 [employer].

## 2017-10-29 NOTE — Progress Notes (Signed)
11/01/2017 [name]   [birth year]  [id number]  Primary Physician Blanchie Serve, MD Primary Cardiologist: Dr Beckham Capistran Martinique MD  HPI:  83 y/o female seen for follow up Afib.  Her daughter, (a former Therapist, sports on the renal unit) is with her. She presented in  May 2017. She started to complain of dyspnea which was fairly rapid in onset. She presented to the ED and was noted to be in AF and CHF.  She was placed on Xarelto and diuresed. Her beta blocker was adjusted. She underwent OP DCCV 07/08/16 but failed to hold NSR and was back in AF with CVR by the end of August. She was last seen with complaints of new LE edema with weeping areas. She was placed on lasix by her PCP without significant improvement. Her lasix dose was increased to 40 mg daily with improvement. Echo showed normal LV function, biatrial enlargement, pulmonary HTN.   On follow up today she states she is doing well. Her most strenuous activity is walking to the dining room once a day with her rollator. She does have some SOB with this. She denies any swelling. She is wearing support hose.  She denies any  orthopnea or PND. No chest pain or dizziness. She just had some dental work today.   Current Outpatient Medications  Medication Sig Dispense Refill  . acetaminophen (TYLENOL) 325 MG tablet Take 650 mg by mouth every 6 (six) hours as needed for mild pain.    . Calcium Carbonate (CALCIUM 600 PO) Take 600-900 mg by mouth. Take one tablet 600 mg in morning, take 1 1/2 tablets in evening for calcium     . dextromethorphan-guaiFENesin (MUCINEX DM) 30-600 MG 12hr tablet Take 1 tablet by mouth 2 (two) times daily as needed for cough.    . Diclofenac Sodium (PENNSAID) 2 % SOLN Place onto the skin. Apply sparingly topical to right side of neck as needed twice a day    . diltiazem (CARDIZEM CD) 120 MG 24 hr capsule Take 1 capsule (120 mg total) by mouth daily. 90 capsule 3  . docusate sodium (COLACE) 100 MG capsule Take 200 mg by mouth every  evening.    . enalapril (VASOTEC) 20 MG tablet Take 20 mg by mouth 2 (two) times daily.      . furosemide (LASIX) 40 MG tablet Take 40 mg by mouth daily.     Marland Kitchen loratadine (CLARITIN) 10 MG tablet Take 10 mg by mouth daily.    . Magnesium Oxide 250 MG TABS Take 250 mg by mouth daily.    . metoprolol succinate (TOPROL-XL) 100 MG 24 hr tablet Take one tablet daily for blood pressure    . metoprolol succinate (TOPROL-XL) 50 MG 24 hr tablet Take 50 mg by mouth daily.     . OXYGEN Inhale 2 L into the lungs daily as needed (shortness of breath). Use 2 liter as needed for SOB, 02 sat < 90% on room air     . potassium chloride SA (K-DUR,KLOR-CON) 20 MEQ tablet Take 20 mEq by mouth daily.     . predniSONE (DELTASONE) 1 MG tablet Take 4 mg by mouth daily with breakfast.    . Rivaroxaban (XARELTO) 15 MG TABS tablet Take 15 mg by mouth daily with supper.    . vitamin C (ASCORBIC ACID) 500 MG tablet Take 500 mg by mouth daily.      . Wheat Dextrin (BENEFIBER PO) Take 4 scoop by mouth daily.  No current facility-administered medications for this visit.     Allergies  Allergen Reactions  . Morphine Sulfate Other (See Comments)    "extremely nauseated"  . Other Nausea And Vomiting    Pt states she is very sensitive to pain medications.    Social History   Socioeconomic History  . Marital status: Widowed    Spouse name: Not on file  . Number of children: Not on file  . Years of education: Not on file  . Highest education level: Not on file  Social Needs  . Financial resource strain: Not on file  . Food insecurity - worry: Not on file  . Food insecurity - inability: Not on file  . Transportation needs - medical: Not on file  . Transportation needs - non-medical: Not on file  Occupational History  . Occupation: Retired  Tobacco Use  . Smoking status: Former Smoker    Last attempt to quit: 12/13/1966    Years since quitting: 50.9  . Smokeless tobacco: Never Used  Substance and Sexual  Activity  . Alcohol use: No  . Drug use: No  . Sexual activity: No  Other Topics Concern  . Not on file  Social History Narrative   Living in [place] since 05/09/2016   Widowed   Former smoked -stopped 1968   Alcohol none   Exercise -none. Sits in wheelchair using her feet to move around   DNR, POA, Living will     Review of Systems: As noted in HPI All other systems reviewed and are otherwise negative except as noted above.    Blood pressure (!) 145/78, pulse 70, height 5\' 1"  (1.549 m), weight 157 lb (71.2 kg).  GENERAL:  Well appearing, elderly WF in NAD HEENT:  PERRL, EOMI, sclera are clear. Oropharynx is clear. NECK:  No jugular venous distention, carotid upstroke brisk and symmetric, no bruits, no thyromegaly or adenopathy LUNGS:  Clear to auscultation bilaterally CHEST:  Unremarkable HEART:  RRR,  PMI not displaced or sustained,S1 and S2 within normal limits, no S3, no S4: no clicks, no rubs, no murmurs ABD:  Soft, nontender. BS +, no masses or bruits. No hepatomegaly, no splenomegaly EXT:  2 + pulses throughout, no edema, no cyanosis no clubbing. Support hose in place. SKIN:  Warm and dry.  No rashes NEURO:  Alert and oriented x 3. Cranial nerves II through XII intact. PSYCH:  Cognitively intact    Lab Results  Component Value Date   WBC 7.4 05/17/2017   HGB 14.2 05/17/2017   HCT 41 05/17/2017   PLT 230 05/17/2017   GLUCOSE 109 (H) 08/12/2016   CHOL 171 08/16/2017   TRIG 160 08/16/2017   HDL 70 08/16/2017   LDLCALC 74 08/16/2017   ALT 16 05/17/2017   AST 22 05/17/2017   NA 138 05/17/2017   K 3.9 05/17/2017   CL 98 (L) 08/12/2016   CREATININE 0.9 05/17/2017   BUN 24 (A) 05/17/2017   CO2 23 08/12/2016   TSH 2.07 05/17/2017   INR 1.23 05/03/2016   HGBA1C 5.4 05/17/2017    Ecg today shows Afib with rate 70. Nonspecific TWA. I have personally reviewed and interpreted this study.   Echo: 10/08/16:  Study Conclusions  - Left ventricle:  The cavity size was normal. Systolic function was   normal. The estimated ejection fraction was in the range of 60%   to 65%. Wall motion was normal; there were no regional wall   motion abnormalities. -  Aortic valve: There was trivial regurgitation. - Mitral valve: Calcified annulus. Mildly thickened leaflets .   There was mild regurgitation. - Left atrium: The atrium was moderately dilated. - Right atrium: The atrium was moderately dilated. - Tricuspid valve: There was mild-moderate regurgitation. There was   evidence of perivalvular regurgitation. - Pulmonary arteries: Systolic pressure was moderately increased.   PA peak pressure: 54 mm Hg (S).  ASSESSMENT AND PLAN:  1. Atrial fibrillation-  permanent. Rate is well controlled. She is asymptomatic. On Xarelto for anticoagulation. Continue metoprolol and diltiazem for rate control 2. Chronic diastolic CHF. She is well compensated on lasix 40 mg dialy.  Will continue current therapy. Continue sodium restriction and support hose.  Weigh daily. I suspect her dyspnea now is mostly related to deconditioning.  3. RA 4. HTN- controlled.  Follow up in 6 months.  Skip Litke Martinique MD,FACC 11/01/2017 4:01 PM

## 2017-11-01 ENCOUNTER — Ambulatory Visit: Payer: PPO | Admitting: Cardiology

## 2017-11-01 ENCOUNTER — Encounter: Payer: Self-pay | Admitting: Cardiology

## 2017-11-01 VITALS — BP 145/78 | HR 70 | Ht 61.0 in | Wt 157.0 lb

## 2017-11-01 DIAGNOSIS — I5032 Chronic diastolic (congestive) heart failure: Secondary | ICD-10-CM | POA: Diagnosis not present

## 2017-11-01 DIAGNOSIS — I4819 Other persistent atrial fibrillation: Secondary | ICD-10-CM

## 2017-11-01 DIAGNOSIS — I10 Essential (primary) hypertension: Secondary | ICD-10-CM

## 2017-11-01 DIAGNOSIS — I481 Persistent atrial fibrillation: Secondary | ICD-10-CM | POA: Diagnosis not present

## 2017-11-01 NOTE — Patient Instructions (Signed)
Continue your current therapy  I will see you in 6 months.   

## 2017-11-09 ENCOUNTER — Non-Acute Institutional Stay: Payer: PPO | Admitting: Nurse Practitioner

## 2017-11-09 ENCOUNTER — Encounter: Payer: Self-pay | Admitting: Nurse Practitioner

## 2017-11-09 DIAGNOSIS — I10 Essential (primary) hypertension: Secondary | ICD-10-CM | POA: Diagnosis not present

## 2017-11-09 DIAGNOSIS — I5032 Chronic diastolic (congestive) heart failure: Secondary | ICD-10-CM | POA: Diagnosis not present

## 2017-11-09 DIAGNOSIS — M069 Rheumatoid arthritis, unspecified: Secondary | ICD-10-CM

## 2017-11-09 DIAGNOSIS — I480 Paroxysmal atrial fibrillation: Secondary | ICD-10-CM

## 2017-11-09 DIAGNOSIS — F418 Other specified anxiety disorders: Secondary | ICD-10-CM

## 2017-11-09 NOTE — Assessment & Plan Note (Signed)
RA stable, maintained on Prednisone 4mg  daily and prn Tylenol.

## 2017-11-09 NOTE — Assessment & Plan Note (Addendum)
history of depression/anxiety, her mood is stable since [place] 08/22/17 of Mirtazapine to 7.5mg  po hs. The patient desires discontinuation of Mirtazapine.

## 2017-11-09 NOTE — Assessment & Plan Note (Signed)
Afib, heart rate is in control while on Diltiazem 120mg  po qd, Metoprolol 150mg  daily. She takes Xarelto 15mg  qd for thromboembolic risk reduction.

## 2017-11-09 NOTE — Assessment & Plan Note (Signed)
CHF, compensated clinically, continue Furosemide 20mg  bid po. Update CMP CBC

## 2017-11-09 NOTE — Assessment & Plan Note (Signed)
Her blood pressure is controlled, continue Enalapril 20mg  bid.

## 2017-11-09 NOTE — Progress Notes (Signed)
Location:  [facility] Room Number: 161 Place of Service:  ALF [id number]) Provider:  Mackayla Mullins, Manxie  NP  Blanchie Serve, MD  Patient Care Team: Blanchie Serve, MD as PCP - General (Internal Medicine) Alania Overholt X, NP as Nurse Practitioner (Internal Medicine) Martinique, Peter M, MD as Consulting Physician (Cardiology) Lavonna Monarch, MD as Consulting Physician (Dermatology) Garald Balding, MD as Consulting Physician (Orthopedic Surgery)  Extended Emergency Contact Information Primary Emergency Contact: Berneta Levins Address: [address] [place], [place] [address] Johnnette Litter of [place] Phone: [phone] Work Phone: [phone] Mobile Phone: [phone] Relation: Daughter Secondary Emergency Contact: Azalee Course States of [place] Phone: [phone] Mobile Phone: [phone] Relation: Son  Code Status:  Full Code Goals of care: Advanced Directive information Advanced Directives 08/23/2017  Does Patient Have a Medical Advance Directive? Yes  Type of Advance Directive Living will;Out of facility DNR (pink MOST or yellow form)  Does patient want to make changes to medical advance directive? No - Patient declined  Copy of [place] in Chart? -  Would patient like information on creating a medical advance directive? -  Pre-existing out of facility DNR order (yellow form or pink MOST form) Yellow form placed in chart (order not valid for inpatient use);Pink MOST form placed in chart (order not valid for inpatient use)     Chief Complaint  Patient presents with  . Medical Management of Chronic Issues    HPI:  Pt is a 83 y.o. female seen today for medical management of chronic diseases.     The patient has history of depression/anxiety, her mood is stable since off Mirtazapine. Afib, heart rate is in control while on Diltiazem 120mg  po qd, Metoprolol 150mg  daily. Last seen by Cardiology 11/01/17.  She takes  Xarelto 15mg  qd for thromboembolic risk reduction. CHF, compensated clinically, taking Furosemide 20mg  bid po. Her blood pressure is controlled on Enalapril 20mg  bid. RA stable, maintained on Prednisone 4mg  daily and prn Tylenol.    Past Medical History:  Diagnosis Date  . Cervicalgia 08/12/2016  . CHF (congestive heart failure) ([place])   . Chronic diastolic CHF (congestive heart failure), NYHA class 3 ([facility]) 06/17/2016  . Complication of anesthesia   . Edema 08/12/2016  . Hypertension   . New onset atrial fibrillation ([place]) 04/2016   a. started on Xarelto and Cardizem  . PONV (postoperative nausea and vomiting)   . RA (rheumatoid arthritis) ([place])   . SOB (shortness of breath) 04/2016   Hospitalist  . Vertigo    Past Surgical History:  Procedure Laterality Date  . Biopsy right breast Right    fibrocystic changes  . CARDIOVERSION N/A 07/08/2016   Procedure: CARDIOVERSION;  Surgeon: Larey Dresser, MD;  Location: [facility];  Service: Cardiovascular;  Laterality: N/A;  . CERVICAL SPINE SURGERY  2005   Dr. Vertell Limber  . HYSTEROSCOPY  2001  . JOINT REPLACEMENT    . REPLACEMENT TOTAL KNEE Right 1996   [place] Bilateral [id number]   Baird Lyons MD    Allergies  Allergen Reactions  . Morphine Sulfate Other (See Comments)    "extremely nauseated"  . Other Nausea And Vomiting    Pt states she is very sensitive to pain medications.    Outpatient Encounter Medications as of 11/09/2017  Medication Sig  . acetaminophen (TYLENOL) 325 MG tablet Take 650 mg by mouth every 6 (six) hours  as needed for mild pain.  . Calcium Carbonate (CALCIUM 600 PO) Take 600-900 mg by mouth. Take one tablet 600 mg in morning, take 1 1/2 tablets in evening for calcium   . dextromethorphan-guaiFENesin (MUCINEX DM) 30-600 MG 12hr tablet Take 1 tablet by mouth 2 (two) times daily as needed for cough.  . Diclofenac Sodium (PENNSAID) 2 % SOLN Place onto the skin. Apply sparingly  topical to right side of neck as needed twice a day  . diltiazem (CARDIZEM CD) 120 MG 24 hr capsule Take 1 capsule (120 mg total) by mouth daily.  Marland Kitchen docusate sodium (COLACE) 100 MG capsule Take 200 mg by mouth every evening.  . enalapril (VASOTEC) 20 MG tablet Take 20 mg by mouth 2 (two) times daily.    . furosemide (LASIX) 40 MG tablet Take 40 mg by mouth daily.   Marland Kitchen loratadine (CLARITIN) 10 MG tablet Take 10 mg by mouth daily.  . Magnesium Oxide 250 MG TABS Take 250 mg by mouth daily.  . metoprolol succinate (TOPROL-XL) 100 MG 24 hr tablet Take one tablet daily for blood pressure  . metoprolol succinate (TOPROL-XL) 50 MG 24 hr tablet Take 50 mg by mouth daily.   . OXYGEN Inhale 2 L into the lungs daily as needed (shortness of breath). Use 2 liter as needed for SOB, 02 sat < 90% on room air   . potassium chloride SA (K-DUR,KLOR-CON) 20 MEQ tablet Take 20 mEq by mouth daily.   . predniSONE (DELTASONE) 1 MG tablet Take 4 mg by mouth daily with breakfast.  . Rivaroxaban (XARELTO) 15 MG TABS tablet Take 15 mg by mouth daily with supper.  . vitamin C (ASCORBIC ACID) 500 MG tablet Take 500 mg by mouth daily.    . Wheat Dextrin (BENEFIBER PO) Take 4 scoop by mouth daily.    No facility-administered encounter medications on file as of 11/09/2017.     Review of Systems  Constitutional: Negative for activity change, appetite change, chills, diaphoresis, fatigue and fever.  HENT: Positive for hearing loss. Negative for congestion, trouble swallowing and voice change.   Eyes: Negative for visual disturbance.  Respiratory: Negative for cough, shortness of breath and wheezing.   Cardiovascular: Positive for leg swelling. Negative for chest pain and palpitations.  Gastrointestinal: Negative for abdominal distention, abdominal pain, constipation, diarrhea, nausea and vomiting.  Endocrine: Negative for cold intolerance.  Genitourinary: Negative for difficulty urinating, dysuria and urgency.    Musculoskeletal: Positive for gait problem. Negative for arthralgias, back pain and joint swelling.  Skin: Negative for pallor and rash.  Neurological: Negative for tremors, speech difficulty, weakness and headaches.  Psychiatric/Behavioral: Negative for agitation, behavioral problems, hallucinations and sleep disturbance. The patient is not nervous/anxious.     Immunization History  Administered Date(s) Administered  . Influenza-Unspecified 09/13/2015, 09/29/2016, 10/03/2017  . Pneumococcal-Unspecified 05/14/2015  . Tdap 04/13/2011   Pertinent  Health Maintenance Due  Topic Date Due  . PNA vac Low Risk Adult (2 of 2 - PCV13) 05/13/2016  . INFLUENZA VACCINE  Completed  . DEXA SCAN  Completed   Fall Risk  08/23/2017 08/12/2016  Falls in the past year? No No   Functional Status Survey:    Vitals:   11/09/17 1155  BP: (!) 146/78  Pulse: 72  Resp: 18  Temp: 98 F (36.7 C)  Weight: 160 lb 3.2 oz (72.7 kg)  Height: 5\' 1"  (1.549 m)   Body mass index is 30.27 kg/m. Physical Exam  Constitutional: She is oriented to  person, place, and time. She appears well-developed and well-nourished.  HENT:  Head: Normocephalic and atraumatic.  Eyes: Conjunctivae and EOM are normal. Pupils are equal, round, and reactive to light.  Corrective glasses.   Neck: Normal range of motion. Neck supple. No JVD present. No thyromegaly present.  Cardiovascular: Normal rate and normal heart sounds.  No murmur heard. Irregular heart beats.   Pulmonary/Chest: Effort normal and breath sounds normal. She has no wheezes. She has no rales.  Abdominal: Soft. Bowel sounds are normal. She exhibits no distension. There is no tenderness.  Musculoskeletal: Normal range of motion. She exhibits edema. She exhibits no tenderness.  Trace edema in BLE, wearing compression hosiery. Ambulates with walker, w/c to go further, self transfer.   Neurological: She is alert and oriented to person, place, and time. She exhibits  normal muscle tone. Coordination normal.  Skin: Skin is warm and dry.  Psychiatric: She has a normal mood and affect. Her behavior is normal. Judgment and thought content normal.    Labs reviewed: Recent Labs    05/17/17 11/10/17  NA 138 138  K 3.9 4.1  BUN 24* 26*  CREATININE 0.9 0.9   Recent Labs    05/17/17 11/10/17  AST 22 21  ALT 16 13  ALKPHOS 51 45   Recent Labs    05/17/17 11/10/17  WBC 7.4 7.2  HGB 14.2 14.2  HCT 41 41  PLT 230 208   Lab Results  Component Value Date   TSH 2.07 05/17/2017   Lab Results  Component Value Date   HGBA1C 5.4 05/17/2017   Lab Results  Component Value Date   CHOL 171 08/16/2017   HDL 70 08/16/2017   LDLCALC 74 08/16/2017   TRIG 160 08/16/2017   CHOLHDL 2.4 05/04/2016    Significant Diagnostic Results in last 30 days:  No results found.  Assessment/Plan Depression with anxiety history of depression/anxiety, her mood is stable since [place] 08/22/17 of Mirtazapine to 7.5mg  po hs. The patient desires discontinuation of Mirtazapine.     Atrial fibrillation (HCC) Afib, heart rate is in control while on Diltiazem 120mg  po qd, Metoprolol 150mg  daily. She takes Xarelto 15mg  qd for thromboembolic risk reduction.   Chronic diastolic CHF (congestive heart failure), NYHA class 3 (HCC) CHF, compensated clinically, continue Furosemide 20mg  bid po. Update CMP CBC  Essential hypertension Her blood pressure is controlled, continue Enalapril 20mg  bid.   Rheumatoid arthritis (HCC) RA stable, maintained on Prednisone 4mg  daily and prn Tylenol.       Family/ staff Communication: plan of care reviewed with the patient and charge nurse.   Labs/tests ordered:  CBC CMP  Time spend 25 minutes.

## 2017-11-10 DIAGNOSIS — I10 Essential (primary) hypertension: Secondary | ICD-10-CM | POA: Diagnosis not present

## 2017-11-10 LAB — CBC AND DIFFERENTIAL
HEMATOCRIT: 41 (ref 36–46)
HEMOGLOBIN: 14.2 (ref 12.0–16.0)
PLATELETS: 208 (ref 150–399)
WBC: 7.2

## 2017-11-10 LAB — HEPATIC FUNCTION PANEL
ALK PHOS: 45 (ref 25–125)
ALT: 13 (ref 7–35)
AST: 21 (ref 13–35)
Bilirubin, Total: 0.6

## 2017-11-10 LAB — BASIC METABOLIC PANEL
BUN: 26 — AB (ref 4–21)
Creatinine: 0.9 (ref <=1.1)
Glucose: 86
Potassium: 4.1 (ref 3.4–5.3)
Sodium: 138 (ref 137–147)

## 2017-11-11 ENCOUNTER — Other Ambulatory Visit: Payer: Self-pay | Admitting: *Deleted

## 2017-12-26 ENCOUNTER — Encounter: Payer: Self-pay | Admitting: Nurse Practitioner

## 2017-12-26 ENCOUNTER — Non-Acute Institutional Stay: Payer: PPO | Admitting: Nurse Practitioner

## 2017-12-26 DIAGNOSIS — M25512 Pain in left shoulder: Secondary | ICD-10-CM | POA: Insufficient documentation

## 2017-12-26 DIAGNOSIS — M069 Rheumatoid arthritis, unspecified: Secondary | ICD-10-CM

## 2017-12-26 NOTE — Assessment & Plan Note (Signed)
Hx of Rheumatoid arthritis, managed on Prednisone 37m daily. Update CBC/diff, CMP, ESR, RAF, CRP in setting of left shoulder pain. Observe.

## 2017-12-26 NOTE — Progress Notes (Signed)
Location:  [facility] Room Number: 671 Place of Service:  ALF [id number]) Provider:  Yoni Lobos, Manxie  NP Blanchie Serve, MD  Patient Care Team: Blanchie Serve, MD as PCP - General (Internal Medicine) Anorah Trias X, NP as Nurse Practitioner (Internal Medicine) Martinique, Peter M, MD as Consulting Physician (Cardiology) Lavonna Monarch, MD as Consulting Physician (Dermatology) Garald Balding, MD as Consulting Physician (Orthopedic Surgery)  Extended Emergency Contact Information Primary Emergency Contact: Berneta Levins Address: [address] [place], [place] [address] Johnnette Litter of [place] Phone: [phone] Work Phone: [phone] Mobile Phone: [phone] Relation: Daughter Secondary Emergency Contact: Azalee Course States of [place] Phone: [phone] Mobile Phone: [phone] Relation: Son  Code Status:  Full Code Goals of care: Advanced Directive information Advanced Directives 08/23/2017  Does Patient Have a Medical Advance Directive? Yes  Type of Advance Directive Living will;Out of facility DNR (pink MOST or yellow form)  Does patient want to make changes to medical advance directive? No - Patient declined  Copy of [place] in Chart? -  Would patient like information on creating a medical advance directive? -  Pre-existing out of facility DNR order (yellow form or pink MOST form) Yellow form placed in chart (order not valid for inpatient use);Pink MOST form placed in chart (order not valid for inpatient use)     Chief Complaint  Patient presents with  . Acute Visit    c/o (L) shoulder pain    HPI:  Pt is a 84 y.o. female seen today for an acute visit for left shoulder pain with movement for a couple of days. The patient denied trauma or injury to the area. She stated her shoulders has limited ROM shoulder R+L shoulder surgeries, but no pain usually. No bruise, redness, swelling noted in her  shoulders. She stated she cannot take anything stronger than Tylenol.   Hx of Rheumatoid arthritis, managed on Prednisone 84m daily.  Past Medical History:  Diagnosis Date  . Cervicalgia 08/12/2016  . CHF ([place]estive heart failure) ([place]   . Chronic diastolic CHF (congestive heart failure), [place]HA class 3 ([place] 06/17/2016  . Complication of anesthesia   . Edema 08/12/2016  . Hypertension   . [place] onset atrial fibrillation ([facility] 04/2016   a. started on Xarelto and Cardizem  . PONV (postoperative nausea and vomiting)   . RA (rheumatoid arthritis) ([facility]   . SOB (shortness of breath) 04/2016   Hospitalist  . Vertigo    Past Surgical History:  Procedure Laterality Date  . Biopsy right breast Right    fibrocystic changes  . CARDIOVERSION N/A 07/08/2016   Procedure: CARDIOVERSION;  Surgeon: DLarey Dresser MD;  [facility]  Service: Cardiovascular;  Laterality: N/A;  . CERVICAL SPINE SURGERY  2005   Dr. SVertell Limber . HYSTEROSCOPY  2001  . JOINT REPLACEMENT    . [place]ACEMENT [place]OTAL [place]NEE Right 1996   [place] [id number]  WBaird LyonsMD    Allergies  Allergen Reactions  . Morphine Sulfate Other (See Comments)    "extremely nauseated"  . Other Nausea And Vomiting    Pt states she is very sensitive to pain medications.    Outpatient Encounter Medications as of 12/26/2017  Medication Sig  . acetaminophen (TYLENOL) 325 MG tablet Take 650 mg by mouth every 6 (six) hours as needed for mild pain.  . Calcium Carbonate (CALCIUM 600 PO)  Take 600-900 mg by mouth. Take one tablet 600 mg in morning, take 1 1/2 tablets in evening for calcium   . dextromethorphan-guaiFENesin (MUCINEX DM) 30-600 MG 12hr tablet Take 1 tablet by mouth 2 (two) times daily as needed for cough.  . Diclofenac Sodium (PENNSAID) 2 % SOLN Place onto the skin. Apply sparingly topical to right side of neck as needed twice a day  . diltiazem (CARDIZEM CD) 120 MG 24 hr capsule  Take 1 capsule (120 mg total) by mouth daily.  Marland Kitchen docusate sodium (COLACE) 100 MG capsule Take 200 mg by mouth every evening.  . enalapril (VASOTEC) 20 MG tablet Take 20 mg by mouth 2 (two) times daily.    . furosemide (LASIX) 40 MG tablet Take 40 mg by mouth daily.   Marland Kitchen loratadine (CLARITIN) 10 MG tablet Take 10 mg by mouth daily.  . Magnesium Oxide 250 MG TABS Take 250 mg by mouth daily.  . metoprolol succinate (TOPROL-XL) 100 MG 24 hr tablet Take one tablet daily for blood pressure  . metoprolol succinate (TOPROL-XL) 50 MG 24 hr tablet Take 50 mg by mouth daily.   . OXYGEN Inhale 2 L into the lungs daily as needed (shortness of breath). Use 2 liter as needed for SOB, 02 sat < 90% on room air   . potassium chloride SA (K-DUR,KLOR-CON) 20 MEQ tablet Take 20 mEq by mouth daily.   . predniSONE (DELTASONE) 1 MG tablet Take 4 mg by mouth daily with breakfast.  . Rivaroxaban (XARELTO) 15 MG TABS tablet Take 15 mg by mouth daily with supper.  . vitamin C (ASCORBIC ACID) 500 MG tablet Take 500 mg by mouth daily.    . Wheat Dextrin (BENEFIBER PO) Take 4 scoop by mouth daily.    No facility-administered encounter medications on file as of 12/26/2017.     Review of Systems  Constitutional: Negative for activity change, appetite change and fever.  HENT: Negative for congestion, trouble swallowing and voice change.   Respiratory: Negative for cough, chest tightness, shortness of breath and wheezing.   Cardiovascular: Positive for leg swelling. Negative for chest pain and palpitations.  Musculoskeletal: Positive for arthralgias and gait problem. Negative for joint swelling, myalgias, neck pain and neck stiffness.       Left shoulder pain with movement for a couple of days.   Skin: Negative for color change, pallor, rash and wound.  Neurological: Negative for tremors, speech difficulty, weakness, numbness and headaches.  Psychiatric/Behavioral: Negative for agitation, behavioral problems, confusion and  hallucinations. The patient is not nervous/anxious.     Immunization History  Administered Date(s) Administered  . Influenza-Unspecified 09/13/2015, 09/29/2016, 10/03/2017  . Pneumococcal-Unspecified 05/14/2015  . Tdap 04/13/2011   Pertinent  Health Maintenance Due  Topic Date Due  . PNA vac Low Risk Adult (2 of 2 - PCV13) 05/13/2016  . INFLUENZA VACCINE  Completed  . DEXA SCAN  Completed   Fall Risk  08/23/2017 08/12/2016  Falls in the past year? No No   Functional Status Survey:    Vitals:   12/26/17 1030  BP: 128/76  Pulse: 68  Resp: 18  Temp: 98.6 F (37 C)  Weight: 160 lb 6.4 oz (72.8 kg)  Height: _0  (1.549 m)   Body mass index is 30.31 kg/m. Physical Exam  Constitutional: She is oriented to person, place, and time. She appears well-developed and well-nourished. No distress.  HENT:  Head: Normocephalic and atraumatic.  Eyes: Conjunctivae and EOM are normal. Pupils are equal, round,  and reactive to light.  Neck: Normal range of motion. No JVD present.  Cardiovascular: Normal rate.  No murmur heard. Irregular heart beats.   Pulmonary/Chest: Effort normal and breath sounds normal. She has no wheezes. She has no rales.  Musculoskeletal: She exhibits edema and tenderness.  Limited over head ROM of the R+L shoulder, left should pain with ROM. Trace edema BLE. Transfers self, w/c to get around.   Neurological: She is alert and oriented to person, place, and time. She exhibits normal muscle tone. Coordination normal.  Skin: Skin is warm and dry. No rash noted. She is not diaphoretic. No erythema.  Psychiatric: She has a normal mood and affect. Her behavior is normal. Thought content normal.    Labs reviewed: Recent Labs    05/17/17 11/10/17  NA 138 138  K 3.9 4.1  BUN 24* 26*  CREATININE 0.9 0.9   Recent Labs    05/17/17 11/10/17  AST 22 21  ALT 16 13  ALKPHOS 51 45   Recent Labs    05/17/17 11/10/17  WBC 7.4 7.2  HGB 14.2 14.2  HCT 41 41  PLT 230  208   Lab Results  Component Value Date   TSH 2.07 05/17/2017   Lab Results  Component Value Date   HGBA1C 5.4 05/17/2017   Lab Results  Component Value Date   CHOL 171 08/16/2017   HDL 70 08/16/2017   LDLCALC 74 08/16/2017   TRIG 160 08/16/2017   CHOLHDL 2.4 05/04/2016    Significant Diagnostic Results in last 30 days:  No results found.  Assessment/Plan Left shoulder pain  left shoulder pain with movement for a couple of days. The patient denied trauma or injury to the area. She stated her shoulders has limited ROM shoulder R+L shoulder surgeries, but no pain usually. No bruise, redness, swelling noted in her shoulders. She stated she cannot take anything stronger than Tylenol. Will obtain X-ray 3 views of the left shoulder to r/o acute process, referral to Ortho for consultation and pain management. Tylenol 524m tid x 7 days for now.     Rheumatoid arthritis (HCC) Hx of Rheumatoid arthritis, managed on Prednisone 475mdaily. Update CBC/diff, CMP, ESR, RAF, CRP in setting of left shoulder pain. Observe.      Family/ staff Communication: plan of care reviewed with the patient and charge nurse.   Labs/tests ordered:  X-ray 3 views left shoulder, CBC/diff, CMP, ESR, RAF, CRP   Time spend 25 minutes.

## 2017-12-26 NOTE — Assessment & Plan Note (Signed)
left shoulder pain with movement for a couple of days. The patient denied trauma or injury to the area. She stated her shoulders has limited ROM shoulder R+L shoulder surgeries, but no pain usually. No bruise, redness, swelling noted in her shoulders. She stated she cannot take anything stronger than Tylenol. Will obtain X-ray 3 views of the left shoulder to r/o acute process, referral to Ortho for consultation and pain management. Tylenol 500mg  tid x 7 days for now.

## 2017-12-27 DIAGNOSIS — I10 Essential (primary) hypertension: Secondary | ICD-10-CM | POA: Diagnosis not present

## 2017-12-27 DIAGNOSIS — M06012 Rheumatoid arthritis without rheumatoid factor, left shoulder: Secondary | ICD-10-CM | POA: Diagnosis not present

## 2017-12-27 DIAGNOSIS — M069 Rheumatoid arthritis, unspecified: Secondary | ICD-10-CM | POA: Diagnosis not present

## 2017-12-27 LAB — HEPATIC FUNCTION PANEL
ALK PHOS: 42 (ref 25–125)
ALT: 14 (ref 7–35)
AST: 20 (ref 13–35)
Bilirubin, Total: 0.8

## 2017-12-27 LAB — BASIC METABOLIC PANEL
BUN: 21 (ref 4–21)
Creatinine: 0.8 (ref <=1.1)
Glucose: 95
POTASSIUM: 4 (ref 3.4–5.3)
SODIUM: 136 — AB (ref 137–147)

## 2017-12-27 LAB — CBC AND DIFFERENTIAL
HCT: 41 (ref 36–46)
HEMOGLOBIN: 14.4 (ref 12.0–16.0)
Platelets: 199 (ref 150–399)
WBC: 7

## 2017-12-28 ENCOUNTER — Other Ambulatory Visit: Payer: Self-pay | Admitting: *Deleted

## 2017-12-29 ENCOUNTER — Ambulatory Visit (INDEPENDENT_AMBULATORY_CARE_PROVIDER_SITE_OTHER): Payer: PPO | Admitting: Orthopaedic Surgery

## 2017-12-29 ENCOUNTER — Ambulatory Visit (INDEPENDENT_AMBULATORY_CARE_PROVIDER_SITE_OTHER): Payer: PPO

## 2017-12-29 ENCOUNTER — Encounter (INDEPENDENT_AMBULATORY_CARE_PROVIDER_SITE_OTHER): Payer: Self-pay | Admitting: Orthopaedic Surgery

## 2017-12-29 VITALS — BP 136/75 | HR 85 | Resp 14 | Ht 64.0 in | Wt 175.0 lb

## 2017-12-29 DIAGNOSIS — M25512 Pain in left shoulder: Secondary | ICD-10-CM

## 2017-12-29 MED ORDER — LIDOCAINE HCL 1 % IJ SOLN
2.0000 mL | INTRAMUSCULAR | Status: AC | PRN
  Administered 2017-12-29: 2 mL

## 2017-12-29 MED ORDER — METHYLPREDNISOLONE ACETATE 40 MG/ML IJ SUSP
40.0000 mg | INTRAMUSCULAR | Status: AC | PRN
  Administered 2017-12-29: 40 mg via INTRAMUSCULAR

## 2017-12-29 NOTE — Progress Notes (Signed)
Office Visit Note   Patient: [name]           Date of Birth: 03-Dec-1928           MRN: [id number] Visit Date: 12/29/2017              Requested by: Blanchie Serve, MD [address] [place], [place] [address] PCP: Blanchie Serve, MD   Assessment & Plan: Visit Diagnoses:  1. Acute pain of left shoulder   2. Trigger point of left shoulder region     Plan: Areas of trigger point tenderness about the left scapula. These were injected 3. No abnormalities identified on shoulder films. We'll see back as needed  Follow-Up Instructions: Return if symptoms worsen or fail to improve.   Orders:  Orders Placed This Encounter  Procedures  . Trigger Point Inj  . XR Shoulder Left   No orders of the defined types were placed in this encounter.     Procedures: Trigger Point Inj Date/Time: 12/29/2017 3:38 PM Performed by: Garald Balding, MD Authorized by: Garald Balding, MD   Consent Given by:  Patient Total # of Trigger Points:  3 or more Location: shoulder   Approach:  Dorsal Medications #1:  2 mL lidocaine 1 % Medications #2:  40 mg methylPREDNISolone acetate 40 MG/ML     Clinical Data: No additional findings.   Subjective: Chief Complaint  Patient presents with  . Left Shoulder - Pain    [name] is an 84 y o here today for left shoulder pain x 4 days. Hx of L and R shoulder replacement and neck pain. She states when she turns a certain way that hurts.  No history of injury or trauma. Had reverse shoulder arthroplasty years ago when is done relatively well although she has some limited motion. She is really not having pain. Her present pain is localized along the medial border of the left scapula. She's having difficulty with positioning and motion of her shoulder with pain referred to that region. No neck pain. No anterior shoulder discomfort presently  HPI  Review of Systems  Constitutional: Negative for chills, fatigue and fever.  Eyes: Negative  for itching.  Respiratory: Negative for chest tightness and shortness of breath.   Cardiovascular: Negative for chest pain, palpitations and leg swelling.  Gastrointestinal: Negative for blood in stool, constipation and diarrhea.  Endocrine: Negative for polyuria.  Genitourinary: Negative for dysuria.  Musculoskeletal: Positive for joint swelling. Negative for back pain, neck pain and neck stiffness.  Allergic/Immunologic: Negative for immunocompromised state.  Neurological: Negative for dizziness and numbness.  Hematological: Bruises/bleeds easily.  Psychiatric/Behavioral: The patient is not nervous/anxious.      Objective: Vital Signs: BP 136/75   Pulse 85   Resp 14   Ht 5\' 4"  (1.626 m)   Wt 175 lb (79.4 kg)   BMI 30.04 kg/m   Physical Exam  Ortho Exam accompanied by daughter. Rona Ravens sports in a wheelchair. Awake alert and oriented. Very pleasant. Examination left shoulder with painless range of motion. No impingement. No catching. Good grip and good release. Several areas of trigger point tenderness along the medial border of the scapula. No masses no induration or ecchymosis.  Specialty Comments:  No specialty comments available.  Imaging: Xr Shoulder Left  Result Date: 12/29/2017 Films of the left shoulder obtained in several projections are there is a reverse shoulder arthroplasty in good position and without complication. No evidence of acute bony abnormality.  PMFS History: Patient Active Problem List   Diagnosis Date Noted  . Left shoulder pain 12/26/2017  . Major depression, chronic 08/11/2017  . Constipation 03/03/2017  . Skin lesion of right lower extremity 01/26/2017  . Depression with anxiety 01/26/2017  . Hyperlipidemia 12/27/2016  . Edema leg 09/22/2016  . Keratoacanthoma 08/12/2016  . Osteoarthritis 08/12/2016  . Cervicalgia 08/12/2016  . Chronic anticoagulation 07/29/2016  . Chronic diastolic CHF (congestive heart failure), NYHA class 3 ([facility])  06/17/2016  . Atrial fibrillation ([facility]) 05/03/2016  . Abnormal chest CT 05/03/2016  . Syncope 11/03/2014  . Essential hypertension 11/03/2014  . Rheumatoid arthritis ([facility]) 11/03/2014   Past Medical History:  Diagnosis Date  . Cervicalgia 08/12/2016  . CHF (congestive heart failure) ([facility])   . Chronic diastolic CHF (congestive heart failure), NYHA class 3 ([facility]) 06/17/2016  . Complication of anesthesia   . Edema 08/12/2016  . Hypertension   . New onset atrial fibrillation ([facility]) 04/2016   a. started on Xarelto and Cardizem  . PONV (postoperative nausea and vomiting)   . RA (rheumatoid arthritis) ([facility])   . SOB (shortness of breath) 04/2016   Hospitalist  . Vertigo     Family History  Problem Relation Age of Onset  . Cancer Sister        brain  . Arthritis Daughter   . Hypothyroidism Daughter   . Hypertension Son   . Arthritis Son     Past Surgical History:  Procedure Laterality Date  . Biopsy right breast Right    fibrocystic changes  . CARDIOVERSION N/A 07/08/2016   Procedure: CARDIOVERSION;  Surgeon: Larey Dresser, MD;  Location: [facility];  Service: Cardiovascular;  Laterality: N/A;  . CERVICAL SPINE SURGERY  2005   Dr. Vertell Limber  . HYSTEROSCOPY  2001  . JOINT REPLACEMENT    . REPLACEMENT TOTAL KNEE Right 1996   [place] Bilateral [id number]   Baird Lyons MD   Social History   Occupational History  . Occupation: Retired  Tobacco Use  . Smoking status: Former Smoker    Last attempt to quit: 12/13/1966    Years since quitting: 51.0  . Smokeless tobacco: Never Used  Substance and Sexual Activity  . Alcohol use: No  . Drug use: No  . Sexual activity: No

## 2018-03-02 ENCOUNTER — Encounter: Payer: Self-pay | Admitting: Internal Medicine

## 2018-03-02 ENCOUNTER — Non-Acute Institutional Stay: Payer: PPO | Admitting: Internal Medicine

## 2018-03-02 DIAGNOSIS — M069 Rheumatoid arthritis, unspecified: Secondary | ICD-10-CM | POA: Diagnosis not present

## 2018-03-02 DIAGNOSIS — I5032 Chronic diastolic (congestive) heart failure: Secondary | ICD-10-CM | POA: Diagnosis not present

## 2018-03-02 DIAGNOSIS — K5901 Slow transit constipation: Secondary | ICD-10-CM

## 2018-03-02 DIAGNOSIS — I480 Paroxysmal atrial fibrillation: Secondary | ICD-10-CM | POA: Diagnosis not present

## 2018-03-02 DIAGNOSIS — Z7901 Long term (current) use of anticoagulants: Secondary | ICD-10-CM | POA: Diagnosis not present

## 2018-03-02 DIAGNOSIS — R2681 Unsteadiness on feet: Secondary | ICD-10-CM | POA: Diagnosis not present

## 2018-03-02 DIAGNOSIS — I10 Essential (primary) hypertension: Secondary | ICD-10-CM | POA: Diagnosis not present

## 2018-03-02 DIAGNOSIS — M1990 Unspecified osteoarthritis, unspecified site: Secondary | ICD-10-CM

## 2018-03-02 DIAGNOSIS — E663 Overweight: Secondary | ICD-10-CM | POA: Diagnosis not present

## 2018-03-02 LAB — CMP 10231
Albumin: 4.2
CALCIUM: 10
CHLORIDE: 98
Carbon Dioxide, Total: 30
GFR CALC NON AF AMER: 64
Globulin: 1.8
TOTAL PROTEIN: 6

## 2018-03-02 NOTE — Progress Notes (Signed)
Location:  [facility] Room Number: 782 Place of Service:  ALF [phone]) Provider:  Blanchie Serve MD  Blanchie Serve, MD  Patient Care Team: Blanchie Serve, MD as PCP - General (Internal Medicine) Mast, Man X, NP as Nurse Practitioner (Internal Medicine) Martinique, Peter M, MD as Consulting Physician (Cardiology) Lavonna Monarch, MD as Consulting Physician (Dermatology) Garald Balding, MD as Consulting Physician (Orthopedic Surgery)  Extended Emergency Contact Information Primary Emergency Contact: Berneta Levins Address: [address] [place], [place] [address] Johnnette Litter of [place] Phone: [phone] Work Phone: [phone] Mobile Phone: [phone] Relation: Daughter Secondary Emergency Contact: Azalee Course [place] of [place] Phone: [phone] Mobile Phone: [phone] Relation: Son  Code Status:  DNR  Goals of care: Advanced Directive information Advanced Directives 03/02/2018  Does Patient Have a Medical Advance Directive? Yes  Type of Advance Directive Living will;Out of facility DNR (pink MOST or yellow form);Healthcare Power of Attorney  Does patient want to make changes to medical advance directive? No - Patient declined  Copy of [employer] in Chart? Yes  Would patient like information on creating a medical advance directive? -  Pre-existing out of facility DNR order (yellow form or pink MOST form) Yellow form placed in chart (order not valid for inpatient use)     Chief Complaint  Patient presents with  . Medical Management of Chronic Issues    Routine Visit     HPI:  Pt is a 84 y.o. female seen today for medical management of chronic diseases. She is seen with the charge nurse present. She denies any particular health concern this visit. She is continent with bowel and bladder. She participates in facility activities and socializes.   Unsteady gait- no fall reported. She ambulates  with walker for short distance and wheelchair for longer distance. She needs 1 person limited assistance with activity like bathing.   Essential hypertension- currently on metoprolol succinate. Enalapril 20 mg bid. Reviewed BP reading with SBP <140.   OA- s/p multiple surgeries to her shoulders. Currently on tylenol 500 mg tid with 650 mg q6h as needed for pain and calcium supplement. Pain under control. Able to perform her ADLs.   Constipation- on benefiber supplement with colace daily. Has bowel movement everyday.  Chronic diastolic CHF- on metoprolol succinate, enalapril and lasix with kcl supplement. Wears ted hose. breathing stable.   afib- denies palpitation or chest pain. Currently on diltiazem 120 mg daily and metoprolol succinate and rivaroxaban.  Allergic rhinitis- stable, takes claritin 10 mg daily  RA- on chronic prednisone 4 mg daily with scheduled and prn tylenol. Denies recent flare up.    Past Medical History:  Diagnosis Date  . Cervicalgia 08/12/2016  . CHF (congestive heart failure) ([facility])   . Chronic diastolic CHF (congestive heart failure), NYHA class 3 ([facility]) 06/17/2016  . Complication of anesthesia   . Edema 08/12/2016  . Hypertension   . New onset atrial fibrillation ([facility]) 04/2016   a. started on Xarelto and Cardizem  . PONV (postoperative nausea and vomiting)   . RA (rheumatoid arthritis) ([facility])   . SOB (shortness of breath) 04/2016   Hospitalist  . Vertigo    Past Surgical History:  Procedure Laterality Date  . Biopsy right breast Right    fibrocystic changes  . CARDIOVERSION N/A 07/08/2016   Procedure: CARDIOVERSION;  Surgeon: Larey Dresser, MD;  Location: [facility];  Service: Cardiovascular;  Laterality: N/A;  . CERVICAL SPINE SURGERY  2005   Dr. Vertell Limber  . HYSTEROSCOPY  2001  . JOINT REPLACEMENT    . REPLACEMENT TOTAL KNEE Right 1996   [place] Bilateral [id number]   Baird Lyons MD    Allergies  Allergen  Reactions  . Morphine Sulfate Other (See Comments)    "extremely nauseated"  . Other Nausea And Vomiting    Pt states she is very sensitive to pain medications.    Outpatient Encounter Medications as of 03/02/2018  Medication Sig  . acetaminophen (TYLENOL) 325 MG tablet Take 650 mg by mouth every 6 (six) hours as needed for mild pain.  Marland Kitchen acetaminophen (TYLENOL) 500 MG tablet Take 500 mg by mouth 3 (three) times daily.  Marland Kitchen amoxicillin (AMOXIL) 500 MG capsule Take 2,000 mg by mouth as needed (1 hour prior to dental appointment).  . Calcium Carbonate (CALCIUM 600 PO) Take 600-900 mg by mouth. Take one tablet 600 mg in morning, take 1 1/2 tablets in evening for calcium   . dextromethorphan-guaiFENesin (MUCINEX DM) 30-600 MG 12hr tablet Take 1 tablet by mouth 2 (two) times daily as needed for cough.  . Diclofenac Sodium (PENNSAID) 2 % SOLN Place onto the skin. Apply sparingly topical to right side of neck as needed twice a day  . diltiazem (CARDIZEM CD) 120 MG 24 hr capsule Take 1 capsule (120 mg total) by mouth daily.  Marland Kitchen docusate sodium (COLACE) 100 MG capsule Take 200 mg by mouth every evening.  . enalapril (VASOTEC) 20 MG tablet Take 20 mg by mouth 2 (two) times daily.    . furosemide (LASIX) 40 MG tablet Take 40 mg by mouth daily.   Marland Kitchen loratadine (CLARITIN) 10 MG tablet Take 10 mg by mouth daily.  . Magnesium Oxide 250 MG TABS Take 250 mg by mouth daily.  . metoprolol succinate (TOPROL-XL) 100 MG 24 hr tablet Take one tablet daily for blood pressure  . metoprolol succinate (TOPROL-XL) 50 MG 24 hr tablet Take 50 mg by mouth daily.   . OXYGEN Inhale 2 L into the lungs daily as needed (shortness of breath). Use 2 liter as needed for SOB, 02 sat < 90% on room air   . potassium chloride SA (K-DUR,KLOR-CON) 20 MEQ tablet Take 20 mEq by mouth daily.   . predniSONE (DELTASONE) 1 MG tablet Take 4 mg by mouth daily with breakfast.  . Rivaroxaban (XARELTO) 15 MG TABS tablet Take 15 mg by mouth daily with  supper.  . vitamin C (ASCORBIC ACID) 500 MG tablet Take 500 mg by mouth daily.    . Wheat Dextrin (BENEFIBER PO) Take 4 scoop by mouth daily.    No facility-administered encounter medications on file as of 03/02/2018.     Review of Systems  Constitutional: Negative for appetite change, chills, diaphoresis, fatigue and fever.  HENT: Positive for hearing loss. Negative for congestion, ear pain, mouth sores, postnasal drip, rhinorrhea, sinus pressure, sore throat and trouble swallowing.   Eyes: Positive for visual disturbance. Negative for discharge, redness and itching.       Has corrective glasses  Respiratory: Positive for shortness of breath. Negative for cough and choking.        Has some dyspnea with exertion, denies PND or orthopnea  Cardiovascular: Positive for leg swelling. Negative for chest pain and palpitations.  Gastrointestinal: Negative for abdominal pain, anal bleeding, blood in stool, constipation, diarrhea, nausea, rectal pain and vomiting.  Bowel regimen are helpful. Has bowel movement every day. She has history of hemorrhoids.  Genitourinary: Negative for difficulty urinating, dysuria, frequency, hematuria and pelvic pain.  Musculoskeletal: Positive for arthralgias and gait problem. Negative for back pain and joint swelling.  Skin: Negative for rash and wound.  Neurological: Negative for dizziness, seizures, light-headedness, numbness and headaches.  Hematological: Bruises/bleeds easily.  Psychiatric/Behavioral: Negative for behavioral problems, confusion and sleep disturbance.    Immunization History  Administered Date(s) Administered  . Influenza-Unspecified 09/13/2015, 09/29/2016, 10/03/2017  . Pneumococcal-Unspecified 05/14/2015  . Tdap 04/13/2011   Pertinent  Health Maintenance Due  Topic Date Due  . PNA vac Low Risk Adult (2 of 2 - PCV13) 05/13/2016  . INFLUENZA VACCINE  Completed  . DEXA SCAN  Completed   Fall Risk  08/23/2017 08/12/2016  Falls in the  past year? No No   Functional Status Survey:    Vitals:   03/02/18 1056  BP: 138/66  Pulse: 70  Resp: 18  Temp: 97.9 F (36.6 C)  TempSrc: Oral  SpO2: 96%  Weight: 163 lb 12.8 oz (74.3 kg)  Height: 5\' 4"  (1.626 m)   Body mass index is 28.12 kg/m.   Wt Readings from Last 3 Encounters:  03/02/18 163 lb 12.8 oz (74.3 kg)  12/29/17 175 lb (79.4 kg)  12/26/17 160 lb 6.4 oz (72.8 kg)   Physical Exam  Constitutional: She is oriented to person, place, and time. She appears well-developed. No distress.  Overweight, elderly female  HENT:  Head: Normocephalic and atraumatic.  Right Ear: External ear normal.  Left Ear: External ear normal.  Nose: Nose normal.  Mouth/Throat: Oropharynx is clear and moist. No oropharyngeal exudate.  Eyes: Pupils are equal, round, and reactive to light. Conjunctivae and EOM are normal. Right eye exhibits no discharge. Left eye exhibits no discharge. No scleral icterus.  Neck: Normal range of motion. Neck supple. No thyromegaly present.  Cardiovascular: Intact distal pulses.  No murmur heard. Irregular heart rate  Pulmonary/Chest: Effort normal and breath sounds normal. No respiratory distress. She has no wheezes. She has no rales.  Abdominal: Soft. Bowel sounds are normal. There is no tenderness. There is no guarding.  Musculoskeletal: She exhibits edema and deformity.  Able to move all 4 extremities, limited ROM with both shoulders, arthritis changes to er hands and fingers, ted hose in place  Lymphadenopathy:    She has no cervical adenopathy.  Neurological: She is alert and oriented to person, place, and time. She exhibits normal muscle tone.  Skin: Skin is warm and dry. She is not diaphoretic.  Dry skin, keratotic lesion to LLE  Psychiatric: She has a normal mood and affect. Her behavior is normal.    Labs reviewed: Recent Labs    05/17/17 11/10/17 12/27/17  NA 138 138 136*  K 3.9 4.1 4.0  CL  --   --  98  CO2  --   --  30  BUN 24* 26*  21  CREATININE 0.9 0.9 0.8  CALCIUM  --   --  10.0    Recent Labs    05/17/17 11/10/17 12/27/17  AST 22 21 20   ALT 16 13 14   ALKPHOS 51 45 42  PROT  --   --  6.0  ALBUMIN  --   --  4.2   Recent Labs    05/17/17 11/10/17 12/27/17  WBC 7.4 7.2 7.0  HGB 14.2 14.2 14.4  HCT 41 41 41  PLT 230 208 199   Lab  Results  Component Value Date   TSH 2.07 05/17/2017   Lab Results  Component Value Date   HGBA1C 5.4 05/17/2017   Lab Results  Component Value Date   CHOL 171 08/16/2017   HDL 70 08/16/2017   LDLCALC 74 08/16/2017   TRIG 160 08/16/2017   CHOLHDL 2.4 05/04/2016    Significant Diagnostic Results in last 30 days:  No results found.   10/08/16 echocardiogram- EF 60-65%, PA peak pressure 54 mm Hg- moderately increased  Assessment/Plan  1. Chronic diastolic CHF (congestive heart failure), NYHA class 3 (HCC) Continue metoprolol succinate 100 mg am and 50 mg pm with enalapril 20 mg bid and lasix 40 mg daily. Continue kcl supplement. Reviewed BMP. Continue compression stockings.  2. Paroxysmal atrial fibrillation (HCC) Controlled HR. Continue diltiazem and metoprolol succinate with xarelto for stroke prophylaxis  3. Essential hypertension Controlled BP readings. No changes made. Reviewed BMP. Check lipid panel  4. Rheumatoid arthritis of wrist, unspecified laterality, unspecified rheumatoid factor presence (HCC) Continue prednisone. Cbc reviewed.   5. Osteoarthritis, unspecified osteoarthritis type, unspecified site Continue scheduled tylenol. D/c prn tylenol for non use.   6. Chronic anticoagulation Continue xarelto, monitor for bleed  7. Slow transit constipation No changes made, maintain hydration and mobility  8. Unsteady gait Fall precautions, to use assistive device for mobility.   9. Overweight Monitor po intake, activities encouraged as tolerated. Reviewed blood glucose. Check TSH and lipid   Family/ staff Communication: reviewed care plan with  patient and charge nurse.    Labs/tests ordered:  Lipid, TSH next lab   [facility], MD Internal Medicine [employer] [facility] [place], [place] [address] Cell Phone (Monday-Friday 8 am - 5 pm): [phone] On Call: [phone] and follow prompts after 5 pm and on weekends Office Phone: [phone] Office Fax: [phone]

## 2018-03-07 DIAGNOSIS — I5032 Chronic diastolic (congestive) heart failure: Secondary | ICD-10-CM | POA: Diagnosis not present

## 2018-03-07 DIAGNOSIS — E663 Overweight: Secondary | ICD-10-CM | POA: Diagnosis not present

## 2018-03-07 LAB — LIPID PANEL
CHOLESTEROL: 185 (ref 0–200)
HDL: 73 — AB (ref 35–70)
LDL CALC: 86
LDL/HDL RATIO: 2.5
TRIGLYCERIDES: 158 (ref 40–160)

## 2018-03-07 LAB — TSH: TSH: 1.82 (ref <=5.90)

## 2018-03-08 ENCOUNTER — Other Ambulatory Visit: Payer: Self-pay | Admitting: *Deleted

## 2018-04-22 NOTE — Progress Notes (Signed)
04/26/2018 [name]   [birth year]  [id number]  Primary Physician Blanchie Serve, MD Primary Cardiologist: Dr Shaylan Tutton [place] MD  HPI:  84 y/o female seen for follow up Afib.  Her daughter, (a former Therapist, sports on the renal unit) is with her. She presented in  May 2017. She started to complain of dyspnea which was fairly rapid in onset. She presented to the ED and was noted to be in AF and CHF.  She was placed on Xarelto and diuresed. Her beta blocker was adjusted. She underwent OP DCCV 07/08/16 but failed to hold NSR and was back in AF with CVR by the end of August. She was seen in the fall with complaints of new LE edema with weeping areas.  Her lasix dose was increased to 40 mg daily with improvement. Echo showed normal LV function, biatrial enlargement, pulmonary HTN.   On follow up today she states she is doing well. She has been working on walking more in the hall with her walker. Weight has been stable. No increased edema. Still notes some SOB with activity but no change. Reports BP at facility is 134/70.    Current Outpatient Medications  Medication Sig Dispense Refill  . acetaminophen (TYLENOL) 500 MG tablet Take 500 mg by mouth 3 (three) times daily.    Marland Kitchen amoxicillin (AMOXIL) 500 MG capsule Take 2,000 mg by mouth as needed (1 hour prior to dental appointment).    . Calcium Carbonate (CALCIUM 600 PO) Take 600-900 mg by mouth. Take one tablet 600 mg in morning, take 1 1/2 tablets in evening for calcium     . dextromethorphan-guaiFENesin (MUCINEX DM) 30-600 MG 12hr tablet Take 1 tablet by mouth 2 (two) times daily as needed for cough.    . Diclofenac Sodium (PENNSAID) 2 % SOLN Place onto the skin. Apply sparingly topical to right side of neck as needed twice a day    . diltiazem (CARDIZEM CD) 120 MG 24 hr capsule Take 1 capsule (120 mg total) by mouth daily. 90 capsule 3  . enalapril (VASOTEC) 20 MG tablet Take 20 mg by mouth 2 (two) times daily.      . furosemide (LASIX) 40 MG tablet Take  40 mg by mouth daily.     Marland Kitchen loratadine (CLARITIN) 10 MG tablet Take 10 mg by mouth daily.    . Magnesium Oxide 250 MG TABS Take 250 mg by mouth daily.    . metoprolol succinate (TOPROL-XL) 100 MG 24 hr tablet Take one tablet daily for blood pressure    . metoprolol succinate (TOPROL-XL) 50 MG 24 hr tablet Take 50 mg by mouth daily.     . OXYGEN Inhale 2 L into the lungs daily as needed (shortness of breath). Use 2 liter as needed for SOB, 02 sat < 90% on room air     . potassium chloride SA (K-DUR,KLOR-CON) 20 MEQ tablet Take 20 mEq by mouth daily.     . predniSONE (DELTASONE) 1 MG tablet Take 4 mg by mouth daily with breakfast.    . Rivaroxaban (XARELTO) 15 MG TABS tablet Take 15 mg by mouth daily with supper.    . vitamin C (ASCORBIC ACID) 500 MG tablet Take 500 mg by mouth daily.      . Wheat Dextrin (BENEFIBER PO) Take 4 scoop by mouth daily.      No current facility-administered medications for this visit.     Allergies  Allergen Reactions  . Morphine Sulfate Other (See Comments)    "  extremely nauseated"  . Other Nausea And Vomiting    Pt states she is very sensitive to pain medications.    Social History   Socioeconomic History  . Marital status: Widowed    Spouse name: Not on file  . Number of children: Not on file  . Years of education: Not on file  . Highest education level: Not on file  Occupational History  . Occupation: Retired  Scientific laboratory technician  . Financial resource strain: Not on file  . Food insecurity:    Worry: Not on file    Inability: Not on file  . Transportation needs:    Medical: Not on file    Non-medical: Not on file  Tobacco Use  . Smoking status: Former Smoker    Last attempt to quit: 12/13/1966    Years since quitting: 51.4  . Smokeless tobacco: Never Used  Substance and Sexual Activity  . Alcohol use: No  . Drug use: No  . Sexual activity: Never  Lifestyle  . Physical activity:    Days per week: Not on file    Minutes per session: Not on file   . Stress: Not on file  Relationships  . Social connections:    Talks on phone: Not on file    Gets together: Not on file    Attends religious service: Not on file    Active member of club or organization: Not on file    Attends meetings of clubs or organizations: Not on file    Relationship status: Not on file  . Intimate partner violence:    Fear of current or ex partner: Not on file    Emotionally abused: Not on file    Physically abused: Not on file    Forced sexual activity: Not on file  Other Topics Concern  . Not on file  Social History Narrative   Living in [place] since 05/09/2016   Widowed   Former smoked -stopped 1968   Alcohol none   Exercise -none. Sits in wheelchair using her feet to move around   DNR, POA, Living will     Review of Systems: As noted in HPI All other systems reviewed and are otherwise negative except as noted above.    Blood pressure (!) 157/92, pulse 75, height 5\' 4"  (1.626 m), weight 163 lb (73.9 kg).  GENERAL:  Well appearing WF in NAD HEENT:  PERRL, EOMI, sclera are clear. Oropharynx is clear. NECK:  No jugular venous distention, carotid upstroke brisk and symmetric, no bruits, no thyromegaly or adenopathy LUNGS:  Clear to auscultation bilaterally CHEST:  Unremarkable HEART:  IRRR,  PMI not displaced or sustained,S1 and S2 within normal limits, no S3, no S4: no clicks, no rubs, no murmurs ABD:  Soft, nontender. BS +, no masses or bruits. No hepatomegaly, no splenomegaly EXT:  2 + pulses throughout, no edema- compression socks in place, no cyanosis no clubbing SKIN:  Warm and dry.  No rashes NEURO:  Alert and oriented x 3. Cranial nerves II through XII intact. PSYCH:  Cognitively intact      Lab Results  Component Value Date   WBC 7.0 12/27/2017   HGB 14.4 12/27/2017   HCT 41 12/27/2017   PLT 199 12/27/2017   GLUCOSE 109 (H) 08/12/2016   CHOL 185 03/07/2018   TRIG 158 03/07/2018   HDL 73 (A) 03/07/2018   LDLCALC  86 03/07/2018   ALT 14 12/27/2017   AST 20 12/27/2017   NA 136 (  A) 12/27/2017   K 4.0 12/27/2017   CL 98 12/27/2017   CREATININE 0.8 12/27/2017   BUN 21 12/27/2017   CO2 30 12/27/2017   TSH 1.82 03/07/2018   INR 1.23 05/03/2016   HGBA1C 5.4 05/17/2017   Echo: 10/08/16:  Study Conclusions  - Left ventricle: The cavity size was normal. Systolic function was   normal. The estimated ejection fraction was in the range of 60%   to 65%. Wall motion was normal; there were no regional wall   motion abnormalities. - Aortic valve: There was trivial regurgitation. - Mitral valve: Calcified annulus. Mildly thickened leaflets .   There was mild regurgitation. - Left atrium: The atrium was moderately dilated. - Right atrium: The atrium was moderately dilated. - Tricuspid valve: There was mild-moderate regurgitation. There was   evidence of perivalvular regurgitation. - Pulmonary arteries: Systolic pressure was moderately increased.   PA peak pressure: 54 mm Hg (S).  ASSESSMENT AND PLAN:  1. Atrial fibrillation-  permanent. Rate is well controlled. She is mildly symptomatic with dyspnea. On Xarelto for anticoagulation. Continue metoprolol and diltiazem for rate control 2. Chronic diastolic CHF. She is well compensated on lasix 40 mg dialy.  Weight is stable and she has no edema. Will continue current therapy. Continue sodium restriction and support hose.   3. RA 4. HTN- controlled per patient report.  Follow up in 6 months.  Jeslynn Hollander Martin[place] MD,FACC 04/26/2018 11:38 AM

## 2018-04-26 ENCOUNTER — Encounter: Payer: Self-pay | Admitting: Cardiology

## 2018-04-26 ENCOUNTER — Ambulatory Visit: Payer: PPO | Admitting: Cardiology

## 2018-04-26 VITALS — BP 157/92 | HR 75 | Ht 64.0 in | Wt 163.0 lb

## 2018-04-26 DIAGNOSIS — I5032 Chronic diastolic (congestive) heart failure: Secondary | ICD-10-CM

## 2018-04-26 DIAGNOSIS — I10 Essential (primary) hypertension: Secondary | ICD-10-CM | POA: Diagnosis not present

## 2018-04-26 DIAGNOSIS — I481 Persistent atrial fibrillation: Secondary | ICD-10-CM | POA: Diagnosis not present

## 2018-04-26 DIAGNOSIS — I4819 Other persistent atrial fibrillation: Secondary | ICD-10-CM

## 2018-04-26 NOTE — Patient Instructions (Addendum)
Continue your current therapy  I will see you in 6 months.   

## 2018-05-02 ENCOUNTER — Encounter: Payer: Self-pay | Admitting: *Deleted

## 2018-05-31 ENCOUNTER — Non-Acute Institutional Stay: Payer: PPO | Admitting: Nurse Practitioner

## 2018-05-31 ENCOUNTER — Encounter: Payer: Self-pay | Admitting: Nurse Practitioner

## 2018-05-31 DIAGNOSIS — R600 Localized edema: Secondary | ICD-10-CM | POA: Diagnosis not present

## 2018-05-31 DIAGNOSIS — I10 Essential (primary) hypertension: Secondary | ICD-10-CM | POA: Diagnosis not present

## 2018-05-31 DIAGNOSIS — R2681 Unsteadiness on feet: Secondary | ICD-10-CM

## 2018-05-31 DIAGNOSIS — M069 Rheumatoid arthritis, unspecified: Secondary | ICD-10-CM | POA: Diagnosis not present

## 2018-05-31 DIAGNOSIS — I5032 Chronic diastolic (congestive) heart failure: Secondary | ICD-10-CM | POA: Diagnosis not present

## 2018-05-31 DIAGNOSIS — I480 Paroxysmal atrial fibrillation: Secondary | ICD-10-CM | POA: Diagnosis not present

## 2018-05-31 NOTE — Progress Notes (Signed)
Location:  [facility] Room Number: 664 Place of Service:  ALF [phone]) Provider:  Stephen Turnbaugh, ManXie  NP  Blanchie Serve, MD  Patient Care Team: Blanchie Serve, MD as PCP - General (Internal Medicine) Texie Tupou X, NP as Nurse Practitioner (Internal Medicine) Martinique, Peter M, MD as Consulting Physician (Cardiology) Lavonna Monarch, MD as Consulting Physician (Dermatology) Garald Balding, MD as Consulting Physician (Orthopedic Surgery)  Extended Emergency Contact Information Primary Emergency Contact: Berneta Levins Address: [address] [place], [place] [address] Johnnette Litter of [place] Phone: [phone] Work Phone: [phone] Mobile Phone: [phone] Relation: Daughter Secondary Emergency Contact: Azalee Course [place] of [place] Phone: [phone] Mobile Phone: [phone] Relation: Son  Code Status:  DNR Goals of care: Advanced Directive information Advanced Directives 05/31/2018  Does Patient Have a Medical Advance Directive? Yes  Type of Paramedic of [place];Living will  Does patient want to make changes to medical advance directive? No - Patient declined  Copy of [place] in Chart? Yes  Would patient like information on creating a medical advance directive? -  Pre-existing out of facility DNR order (yellow form or pink MOST form) -     Chief Complaint  Patient presents with  . Medical Management of Chronic Issues    F/U- AFib, CHF, HTN    HPI:  Pt is a 84 y.o. female seen today for medical management of chronic diseases.     The patient has history of Afib, heart rate is in control, taking Metoprolol 150mg  qd, Diltiazem 120mg  qd.  Xarelto 15mg  qd, CHF, chronic trace edema BLE, taking Furosemide 40mg  qd, HTN blood pressure is controlled on Enalapril 20mg  bid, RA  is managed on Tylenol 500mg  tid,  Prednisone 4mg  qd,  Past Medical History:  Diagnosis Date  .  Cervicalgia 08/12/2016  . CHF (congestive heart failure) ([facility])   . Chronic diastolic CHF (congestive heart failure), NYHA class 3 ([place] [facility]) 06/17/2016  . Complication of anesthesia   . Edema 08/12/2016  . Hypertension   . New onset atrial fibrillation ([facility]) 04/2016   a. started on Xarelto and Cardizem  . PONV (postoperative nausea and vomiting)   . RA (rheumatoid arthritis) ([place])   . SOB (shortness of breath) 04/2016   Hospitalist  . Vertigo    Past Surgical History:  Procedure Laterality Date  . Biopsy right breast Right    fibrocystic changes  . CARDIOVERSION N/A 07/08/2016   Procedure: CARDIOVERSION;  Surgeon: Larey Dresser, MD;  Location: [facility];  Service: Cardiovascular;  Laterality: N/A;  . CERVICAL SPINE SURGERY  2005   Dr. Vertell Limber  . HYSTEROSCOPY  2001  . JOINT REPLACEMENT    . REPLACEMENT TOTAL KNEE Right 1996   [place] Bilateral [phone]   Baird Lyons MD    Allergies  Allergen Reactions  . Morphine Sulfate Other (See Comments)    "extremely nauseated"  . Other Nausea And Vomiting    Pt states she is very sensitive to pain medications.    Outpatient Encounter Medications as of 05/31/2018  Medication Sig  . acetaminophen (TYLENOL) 500 MG tablet Take 500 mg by mouth 3 (three) times daily.  Marland Kitchen amoxicillin (AMOXIL) 500 MG capsule Take 2,000 mg by mouth as needed (1 hour prior to dental appointment).  . Calcium Carbonate (CALCIUM 600 PO) Take 600-900 mg by mouth. Take one tablet 600 mg in morning, take  1 1/2 tablets in evening for calcium   . dextromethorphan-guaiFENesin (MUCINEX DM) 30-600 MG 12hr tablet Take 1 tablet by mouth 2 (two) times daily as needed for cough.  . Diclofenac Sodium (PENNSAID) 2 % SOLN Place onto the skin. Apply sparingly topical to right side of neck as needed twice a day  . diltiazem (CARDIZEM CD) 120 MG 24 hr capsule Take 1 capsule (120 mg total) by mouth daily.  . enalapril (VASOTEC) 20 MG tablet Take 20 mg  by mouth 2 (two) times daily.    . furosemide (LASIX) 40 MG tablet Take 40 mg by mouth daily.   Marland Kitchen loratadine (CLARITIN) 10 MG tablet Take 10 mg by mouth daily.  . Magnesium Oxide 250 MG TABS Take 250 mg by mouth daily.  . metoprolol succinate (TOPROL-XL) 100 MG 24 hr tablet Take one tablet daily for blood pressure  . metoprolol succinate (TOPROL-XL) 50 MG 24 hr tablet Take 50 mg by mouth daily.   . OXYGEN Inhale 2 L into the lungs daily as needed (shortness of breath). Use 2 liter as needed for SOB, 02 sat < 90% on room air   . potassium chloride SA (K-DUR,KLOR-CON) 20 MEQ tablet Take 20 mEq by mouth daily.   . predniSONE (DELTASONE) 1 MG tablet Take 4 mg by mouth daily with breakfast.  . Rivaroxaban (XARELTO) 15 MG TABS tablet Take 15 mg by mouth daily with supper.  . vitamin C (ASCORBIC ACID) 500 MG tablet Take 500 mg by mouth daily.    . Wheat Dextrin (BENEFIBER PO) Take 4 scoop by mouth daily.    No facility-administered encounter medications on file as of 05/31/2018.     Review of Systems  Constitutional: Negative for activity change, appetite change, chills, diaphoresis, fatigue and fever.  HENT: Positive for hearing loss. Negative for congestion, trouble swallowing and voice change.   Respiratory: Positive for shortness of breath. Negative for cough and wheezing.   Cardiovascular: Positive for leg swelling. Negative for chest pain and palpitations.  Gastrointestinal: Negative for abdominal distention, abdominal pain, constipation, diarrhea, nausea and vomiting.  Genitourinary: Negative for difficulty urinating, dysuria and urgency.  Musculoskeletal: Positive for arthralgias and gait problem.  Skin: Negative for color change and pallor.  Neurological: Negative for dizziness, speech difficulty, weakness and headaches.       Memory lapses.   Psychiatric/Behavioral: Negative for agitation, behavioral problems, hallucinations and sleep disturbance. The patient is not nervous/anxious.      Immunization History  Administered Date(s) Administered  . Influenza-Unspecified 09/13/2015, 09/29/2016, 10/03/2017  . Pneumococcal-Unspecified 05/14/2015  . Tdap 04/13/2011   Pertinent  Health Maintenance Due  Topic Date Due  . PNA vac Low Risk Adult (2 of 2 - PCV13) 05/13/2016  . INFLUENZA VACCINE  07/13/2018  . DEXA SCAN  Completed   Fall Risk  08/23/2017 08/12/2016  Falls in the past year? No No   Functional Status Survey:    Vitals:   05/31/18 1130  BP: 130/72  Pulse: 68  Resp: 18  Temp: 98.2 F (36.8 C)  SpO2: 96%  Weight: 162 lb 6.4 oz (73.7 kg)  Height: 5\' 4"  (1.626 m)   Body mass index is 27.88 kg/m. Physical Exam  Constitutional: She appears well-developed and well-nourished.  HENT:  Head: Normocephalic and atraumatic.  Eyes: Pupils are equal, round, and reactive to light. EOM are normal.  Neck: Normal range of motion. Neck supple. No JVD present. No thyromegaly present.  Cardiovascular: Normal rate.  No murmur heard. Irregular heart  beats.   Pulmonary/Chest: She has no wheezes. She has no rales.  Abdominal: Soft. Bowel sounds are normal. She exhibits no distension. There is no tenderness.  Musculoskeletal: She exhibits edema and tenderness.  Multiple joints aches/pains has no change, limited R+L shoulder overhead ROM. Self transfer. W/c for mobility   Neurological: She is alert. No cranial nerve deficit. She exhibits normal muscle tone. Coordination normal.  Oriented to person and place.   Skin: Skin is warm and dry.  Psychiatric: She has a normal mood and affect. Her behavior is normal.    Labs reviewed: Recent Labs    11/10/17 12/27/17 06/01/18  NA 138 136* 139  K 4.1 4.0 4.1  CL  --  98 100  CO2  --  30 31  BUN 26* 21 25*  CREATININE 0.9 0.8 0.9  CALCIUM  --  10.0 9.4   Recent Labs    11/10/17 12/27/17 06/01/18  AST 21 20 18   ALT 13 14 22   ALKPHOS 45 42 49  PROT  --  6.0  --   ALBUMIN  --  4.2 4.1   Recent Labs    11/10/17  12/27/17  WBC 7.2 7.0  HGB 14.2 14.4  HCT 41 41  PLT 208 199   Lab Results  Component Value Date   TSH 1.82 03/07/2018   Lab Results  Component Value Date   HGBA1C 5.4 05/17/2017   Lab Results  Component Value Date   CHOL 185 03/07/2018   HDL 73 (A) 03/07/2018   LDLCALC 86 03/07/2018   TRIG 158 03/07/2018   CHOLHDL 2.4 05/04/2016    Significant Diagnostic Results in last 30 days:  No results found.  Assessment/Plan Essential hypertension Blood pressure is controlled, continue Metoprolol, Diltiazem, Enalapril, Furosemide.   Atrial fibrillation (HCC) Heart rate is in control, continue Metoprolol, Diltiazem, continue  Xarelto for thromboembolic risk reduction.   Rheumatoid arthritis (HCC) Stable, multiple sites, continue Tylenol and Prednisone. Observe.   Unsteady gait Self transfer, w/c to get around.   Edema leg Trace edema BLE  Chronic diastolic CHF (congestive heart failure), NYHA class 3 (HCC) Compensated clinically, continue Furosemide, update CBC CMP     Family/ staff Communication: plan of care reviewed with the patient and charge nurse.   Labs/tests ordered:  CBC CMP  Time spend 25 minutes.

## 2018-05-31 NOTE — Assessment & Plan Note (Addendum)
Compensated clinically, continue Furosemide, update CBC CMP

## 2018-05-31 NOTE — Assessment & Plan Note (Signed)
Blood pressure is controlled, continue Metoprolol, Diltiazem, Enalapril, Furosemide.

## 2018-05-31 NOTE — Assessment & Plan Note (Signed)
Trace edema BLE  

## 2018-05-31 NOTE — Assessment & Plan Note (Signed)
Self transfer, w/c to get around.

## 2018-05-31 NOTE — Assessment & Plan Note (Signed)
Heart rate is in control, continue Metoprolol, Diltiazem, continue  Xarelto for thromboembolic risk reduction.

## 2018-05-31 NOTE — Assessment & Plan Note (Signed)
Stable, multiple sites, continue Tylenol and Prednisone. Observe.

## 2018-06-01 DIAGNOSIS — I5032 Chronic diastolic (congestive) heart failure: Secondary | ICD-10-CM | POA: Diagnosis not present

## 2018-06-01 LAB — BASIC METABOLIC PANEL
BUN: 25 — AB (ref 4–21)
CREATININE: 0.9 (ref <=1.1)
Glucose: 83
Potassium: 4.1 (ref 3.4–5.3)
Sodium: 139 (ref 137–147)

## 2018-06-01 LAB — HEPATIC FUNCTION PANEL
ALK PHOS: 49 (ref 25–125)
ALT: 22 (ref 7–35)
AST: 18 (ref 13–35)
Bilirubin, Total: 0.9

## 2018-06-05 ENCOUNTER — Encounter: Payer: Self-pay | Admitting: Nurse Practitioner

## 2018-06-05 ENCOUNTER — Other Ambulatory Visit: Payer: Self-pay | Admitting: *Deleted

## 2018-06-05 LAB — COMPLETE METABOLIC PANEL WITH GFR
Albumin: 4.1
Calcium: 9.4
Carbon Dioxide, Total: 31
Chloride: 100
GFR CALC NON AF AMER: 55
Globulin: 2
PROTEIN: 6.1

## 2018-07-26 ENCOUNTER — Encounter: Payer: Self-pay | Admitting: Internal Medicine

## 2018-08-15 ENCOUNTER — Encounter: Payer: Self-pay | Admitting: Nurse Practitioner

## 2018-08-15 ENCOUNTER — Non-Acute Institutional Stay: Payer: PPO | Admitting: Nurse Practitioner

## 2018-08-15 DIAGNOSIS — I10 Essential (primary) hypertension: Secondary | ICD-10-CM | POA: Diagnosis not present

## 2018-08-15 DIAGNOSIS — R600 Localized edema: Secondary | ICD-10-CM | POA: Diagnosis not present

## 2018-08-15 DIAGNOSIS — I5032 Chronic diastolic (congestive) heart failure: Secondary | ICD-10-CM

## 2018-08-15 DIAGNOSIS — I480 Paroxysmal atrial fibrillation: Secondary | ICD-10-CM

## 2018-08-15 NOTE — Assessment & Plan Note (Signed)
Compensated clinically, continue Furosemide, observe.

## 2018-08-15 NOTE — Assessment & Plan Note (Signed)
Sbp 142 mmgHg today, she denied headache, dizziness, change of vision, chest pain/pressure or palpation. Continue Metoprolol, Diltiazem, Enalapril, Furosemide. Observe.

## 2018-08-15 NOTE — Progress Notes (Signed)
Location:  [facility] Room Number: 568 Place of Service:  ALF [id number]) Provider: Margarine Grosshans, ManXie  NP  Blanchie Serve, MD  Patient Care Team: Blanchie Serve, MD as PCP - General (Internal Medicine) Tyana Butzer X, NP as Nurse Practitioner (Internal Medicine) Martinique, Peter M, MD as Consulting Physician (Cardiology) Lavonna Monarch, MD as Consulting Physician (Dermatology) Garald Balding, MD as Consulting Physician (Orthopedic Surgery)  Extended Emergency Contact Information Primary Emergency Contact: Berneta Levins Address: [address] [place], [place] [address] Johnnette Litter of [place] Phone: [phone] Work Phone: [phone] Mobile Phone: [phone] Relation: Daughter Secondary Emergency Contact: Azalee Course [place] of [place] Phone: [phone] Mobile Phone: [phone] Relation: Son  Code Status:  DNR Goals of care: Advanced Directive information Advanced Directives 08/15/2018  Does Patient Have a Medical Advance Directive? Yes  Type of Paramedic of [place];Living will;Out of facility DNR (pink MOST or yellow form)  Does patient want to make changes to medical advance directive? No - Patient declined  Copy of [place] in Chart? Yes  Would patient like information on creating a medical advance directive? -  Pre-existing out of facility DNR order (yellow form or pink MOST form) Yellow form placed in chart (order not valid for inpatient use)     Chief Complaint  Patient presents with  . Acute Visit    LLE edema/weeping    HPI:  Pt is a 84 y.o. female seen today for an acute visit for LLE edema/weeping x 2 days, she denied pain in LLE, she is afebrile. Hx of CHF/chronic leg edema, wares compression hosiery, on Furosemide 40mg  qd. She denied SOB, chest pain/pressure, palpitation, cough, or sputum production. She is afebrile, no O2 desaturation.   She has history of CHF,  clinically compensated, chronic swelling legs, on Furosemide. Hx of HTN, controlled on Metoprolol, Diltiazem, Enalapril, Furosemide. Hx of Afib, heart rate is in control, on Xarelto.    Past Medical History:  Diagnosis Date  . Cervicalgia 08/12/2016  . CHF (congestive heart failure) ([facility])   . Chronic diastolic CHF (congestive heart failure), NYHA class 3 ([facility]) 06/17/2016  . Complication of anesthesia   . Edema 08/12/2016  . Hypertension   . New onset atrial fibrillation ([facility]) 04/2016   a. started on Xarelto and Cardizem  . PONV (postoperative nausea and vomiting)   . RA (rheumatoid arthritis) ([facility])   . SOB (shortness of breath) 04/2016   Hospitalist  . Vertigo    Past Surgical History:  Procedure Laterality Date  . Biopsy right breast Right    fibrocystic changes  . CARDIOVERSION N/A 07/08/2016   Procedure: CARDIOVERSION;  Surgeon: Larey Dresser, MD;  Location: [facility];  Service: Cardiovascular;  Laterality: N/A;  . CERVICAL SPINE SURGERY  2005   Dr. Vertell Limber  . HYSTEROSCOPY  2001  . JOINT REPLACEMENT    . REPLACEMENT TOTAL KNEE Right 1996   [facility] Bilateral [phone]   Baird Lyons MD    Allergies  Allergen Reactions  . Morphine Sulfate Other (See Comments)    "extremely nauseated"  . Other Nausea And Vomiting    Pt states she is very sensitive to pain medications.    Outpatient Encounter Medications as of 08/15/2018  Medication Sig  . acetaminophen (TYLENOL) 500 MG tablet Take 500 mg by mouth 3 (three) times daily.  Marland Kitchen amoxicillin (AMOXIL) 500 MG capsule Take 2,000  mg by mouth as needed (1 hour prior to dental appointment).  . Calcium Carbonate (CALCIUM 600 PO) Take 600-900 mg by mouth. Take one tablet 600 mg in morning, take 1 1/2 tablets in evening for calcium   . dextromethorphan-guaiFENesin (MUCINEX DM) 30-600 MG 12hr tablet Take 1 tablet by mouth 2 (two) times daily as needed for cough.  . Diclofenac Sodium (PENNSAID) 2 % SOLN  Place onto the skin. Apply sparingly topical to right side of neck as needed twice a day  . diltiazem (CARDIZEM CD) 120 MG 24 hr capsule Take 1 capsule (120 mg total) by mouth daily.  . enalapril (VASOTEC) 20 MG tablet Take 20 mg by mouth 2 (two) times daily.    . furosemide (LASIX) 40 MG tablet Take 40 mg by mouth daily.   Marland Kitchen loratadine (CLARITIN) 10 MG tablet Take 10 mg by mouth daily.  . Magnesium Oxide 250 MG TABS Take 250 mg by mouth daily.  . metoprolol succinate (TOPROL-XL) 100 MG 24 hr tablet Take one tablet daily for blood pressure  . metoprolol succinate (TOPROL-XL) 50 MG 24 hr tablet Take 50 mg by mouth daily.   . OXYGEN Inhale 2 L into the lungs daily as needed (shortness of breath). Use 2 liter as needed for SOB, 02 sat < 90% on room air   . potassium chloride SA (K-DUR,KLOR-CON) 20 MEQ tablet Take 20 mEq by mouth daily.   . predniSONE (DELTASONE) 1 MG tablet Take 4 mg by mouth daily with breakfast.  . Rivaroxaban (XARELTO) 15 MG TABS tablet Take 15 mg by mouth daily with supper.  . vitamin C (ASCORBIC ACID) 500 MG tablet Take 500 mg by mouth daily.    . Wheat Dextrin (BENEFIBER PO) Take 4 scoop by mouth daily.    No facility-administered encounter medications on file as of 08/15/2018.     Review of Systems  Constitutional: Negative for activity change, appetite change, chills, diaphoresis, fatigue and fever.  HENT: Positive for hearing loss. Negative for congestion and voice change.   Respiratory: Negative for cough, shortness of breath and wheezing.        DOE  Cardiovascular: Positive for leg swelling. Negative for chest pain and palpitations.  Gastrointestinal: Negative for abdominal distention and abdominal pain.  Genitourinary: Negative for difficulty urinating and dysuria.  Musculoskeletal: Positive for arthralgias and gait problem.  Skin: Negative for color change.       A small open area left calf.   Neurological: Negative for dizziness, speech difficulty, weakness and  headaches.       Memory lapse.   Psychiatric/Behavioral: Negative for agitation, behavioral problems and sleep disturbance. The patient is not nervous/anxious.     Immunization History  Administered Date(s) Administered  . Influenza-Unspecified 09/13/2015, 09/29/2016, 10/03/2017  . Pneumococcal-Unspecified 05/14/2015  . Tdap 04/13/2011   Pertinent  Health Maintenance Due  Topic Date Due  . PNA vac Low Risk Adult (2 of 2 - PCV13) 05/13/2016  . INFLUENZA VACCINE  07/13/2018  . DEXA SCAN  Completed   Fall Risk  08/23/2017 08/12/2016  Falls in the past year? No No   Functional Status Survey:    Vitals:   08/15/18 1046  BP: (!) 142/70  Pulse: 84  Resp: 18  Temp: (!) 97.5 F (36.4 C)  SpO2: 97%  Weight: 166 lb (75.3 kg)  Height: 5\' 4"  (1.626 m)   Body mass index is 28.49 kg/m. Physical Exam  Constitutional: She appears well-developed and well-nourished.  HENT:  Head: Normocephalic  and atraumatic.  Eyes: Pupils are equal, round, and reactive to light. EOM are normal.  Neck: Normal range of motion. Neck supple. No JVD present. No thyromegaly present.  Cardiovascular: Normal rate.  No murmur heard. Irregular heart beats.   Pulmonary/Chest: Effort normal. She has no wheezes. She has no rales.  Abdominal: Soft. Bowel sounds are normal. She exhibits no distension. There is no tenderness.  Musculoskeletal: She exhibits edema.  Chronic swelling BLE, 1+, L>R, w/c for mobility  Neurological: She is alert. No cranial nerve deficit. She exhibits normal muscle tone. Coordination normal.  Oriented to person and place.   Skin: Skin is warm and dry. No erythema.  A small open area left calf, no s/s of infection(redness, warmth, or drainage)  Psychiatric: She has a normal mood and affect. Her behavior is normal.    Labs reviewed: Recent Labs    11/10/17 12/27/17 06/01/18  NA 138 136* 139  K 4.1 4.0 4.1  CL  --  98 100  CO2  --  30 31  BUN 26* 21 25*  CREATININE 0.9 0.8 0.9    CALCIUM  --  10.0 9.4   Recent Labs    11/10/17 12/27/17 06/01/18  AST 21 20 18   ALT 13 14 22   ALKPHOS 45 42 49  PROT  --  6.0  --   ALBUMIN  --  4.2 4.1   Recent Labs    11/10/17 12/27/17  WBC 7.2 7.0  HGB 14.2 14.4  HCT 41 41  PLT 208 199   Lab Results  Component Value Date   TSH 1.82 03/07/2018   Lab Results  Component Value Date   HGBA1C 5.4 05/17/2017   Lab Results  Component Value Date   CHOL 185 03/07/2018   HDL 73 (A) 03/07/2018   LDLCALC 86 03/07/2018   TRIG 158 03/07/2018   CHOLHDL 2.4 05/04/2016    Significant Diagnostic Results in last 30 days:  No results found.  Assessment/Plan Edema leg Worsened in LLE, a small open area noted mid of the left calf, no s/s of infection, will increase Furosemide 60/40mg  x 3 days to help healing, then resume 40mg  qd, f/u BMP one week.   Chronic diastolic CHF (congestive heart failure), NYHA class 3 ([facility]) Compensated clinically, continue Furosemide, observe.   Essential hypertension Sbp 142 mmgHg today, she denied headache, dizziness, change of vision, chest pain/pressure or palpation. Continue Metoprolol, Diltiazem, Enalapril, Furosemide. Observe.   Atrial fibrillation (HCC) Heart rate is in control, continue Metoprolol, Diltiazem, Xarelto. F/u Cardiology.      Family/ staff Communication: plan of care reviewed with the patient and charge nurse   Labs/tests ordered:  BMP one week  Time spend 25 minutes.

## 2018-08-15 NOTE — Assessment & Plan Note (Signed)
Heart rate is in control, continue Metoprolol, Diltiazem, Xarelto. F/u Cardiology.

## 2018-08-15 NOTE — Assessment & Plan Note (Signed)
Worsened in LLE, a small open area noted mid of the left calf, no s/s of infection, will increase Furosemide 60/40mg  x 3 days to help healing, then resume 40mg  qd, f/u BMP one week.

## 2018-08-22 DIAGNOSIS — R600 Localized edema: Secondary | ICD-10-CM | POA: Diagnosis not present

## 2018-08-22 LAB — BASIC METABOLIC PANEL
BUN: 25 — AB (ref 4–21)
CREATININE: 0.9 (ref <=1.1)
Glucose: 87
Potassium: 4 (ref 3.4–5.3)
Sodium: 138 (ref 137–147)

## 2018-08-23 ENCOUNTER — Other Ambulatory Visit: Payer: Self-pay | Admitting: *Deleted

## 2018-08-23 LAB — BASIC METABOLIC PANEL
CHLORIDE: 101
CO2: 28
Calcium: 9.5
EGFR (Non-African Amer.): 60

## 2018-09-01 ENCOUNTER — Non-Acute Institutional Stay: Payer: PPO

## 2018-09-01 DIAGNOSIS — Z0000 Encounter for general adult medical examination without abnormal findings: Secondary | ICD-10-CM

## 2018-09-01 NOTE — Patient Instructions (Addendum)
Ms. [name] , Thank you for taking time to come for your Medicare Wellness Visit. I appreciate your ongoing commitment to your health goals. Please review the following plan we discussed and let me know if I can assist you in the future.   Screening recommendations/referrals: Colonoscopy excluded, over age 82 Mammogram excluded, over age 47 Bone Density up to date Recommended yearly ophthalmology/optometry visit for glaucoma screening and checkup Recommended yearly dental visit for hygiene and checkup  Vaccinations: Influenza vaccine due, will receive at [facility] Pneumococcal vaccine up to date, completed Tdap vaccine up to date Shingles vaccine not in past records    Advanced directives: in chart  Conditions/risks identified: none  Next appointment: Dr. Bubba Camp makes rounds   Preventive Care 39 Years and Older, Female Preventive care refers to lifestyle choices and visits with your health care provider that can promote health and wellness. What does preventive care include?  A yearly physical exam. This is also called an annual well check.  Dental exams once or twice a year.  Routine eye exams. Ask your health care provider how often you should have your eyes checked.  Personal lifestyle choices, including:  Daily care of your teeth and gums.  Regular physical activity.  Eating a healthy diet.  Avoiding tobacco and drug use.  Limiting alcohol use.  Practicing safe sex.  Taking low-dose aspirin every day.  Taking vitamin and mineral supplements as recommended by your health care provider. What happens during an annual well check? The services and screenings done by your health care provider during your annual well check will depend on your age, overall health, lifestyle risk factors, and family history of disease. Counseling  Your health care provider may ask you questions about your:  Alcohol use.  Tobacco use.  Drug use.  Emotional well-being.  Home and  relationship well-being.  Sexual activity.  Eating habits.  History of falls.  Memory and ability to understand (cognition).  Work and work Statistician.  Reproductive health. Screening  You may have the following tests or measurements:  Height, weight, and BMI.  Blood pressure.  Lipid and cholesterol levels. These may be checked every 5 years, or more frequently if you are over 64 years old.  Skin check.  Lung cancer screening. You may have this screening every year starting at age 50 if you have a 30-pack-year history of smoking and currently smoke or have quit within the past 15 years.  Fecal occult blood test (FOBT) of the stool. You may have this test every year starting at age 65.  Flexible sigmoidoscopy or colonoscopy. You may have a sigmoidoscopy every 5 years or a colonoscopy every 10 years starting at age 41.  Hepatitis C blood test.  Hepatitis B blood test.  Sexually transmitted disease (STD) testing.  Diabetes screening. This is done by checking your blood sugar (glucose) after you have not eaten for a while (fasting). You may have this done every 1-3 years.  Bone density scan. This is done to screen for osteoporosis. You may have this done starting at age 73.  Mammogram. This may be done every 1-2 years. Talk to your health care provider about how often you should have regular mammograms. Talk with your health care provider about your test results, treatment options, and if necessary, the need for more tests. Vaccines  Your health care provider may recommend certain vaccines, such as:  Influenza vaccine. This is recommended every year.  Tetanus, diphtheria, and acellular pertussis (Tdap, Td) vaccine. You may  need a Td booster every 10 years.  Zoster vaccine. You may need this after age 93.  Pneumococcal 13-valent conjugate (PCV13) vaccine. One dose is recommended after age 18.  Pneumococcal polysaccharide (PPSV23) vaccine. One dose is recommended after  age 59. Talk to your health care provider about which screenings and vaccines you need and how often you need them. This information is not intended to replace advice given to you by your health care provider. Make sure you discuss any questions you have with your health care provider. Document Released: 12/26/2015 Document Revised: 08/18/2016 Document Reviewed: 09/30/2015 Elsevier Interactive Patient Education  2017 [place] Prevention in the Home Falls can cause injuries. They can happen to people of all ages. There are many things you can do to make your home safe and to help prevent falls. What can I do on the outside of my home?  Regularly fix the edges of walkways and driveways and fix any cracks.  Remove anything that might make you trip as you walk through a door, such as a raised step or threshold.  Trim any bushes or trees on the path to your home.  Use bright outdoor lighting.  Clear any walking paths of anything that might make someone trip, such as rocks or tools.  Regularly check to see if handrails are loose or broken. Make sure that both sides of any steps have handrails.  Any raised decks and porches should have guardrails on the edges.  Have any leaves, snow, or ice cleared regularly.  Use sand or salt on walking paths during winter.  Clean up any spills in your garage right away. This includes oil or grease spills. What can I do in the bathroom?  Use night lights.  Install grab bars by the toilet and in the tub and shower. Do not use towel bars as grab bars.  Use non-skid mats or decals in the tub or shower.  If you need to sit down in the shower, use a plastic, non-slip stool.  Keep the floor dry. Clean up any water that spills on the floor as soon as it happens.  Remove soap buildup in the tub or shower regularly.  Attach bath mats securely with double-sided non-slip rug tape.  Do not have throw rugs and other things on the floor that can  make you trip. What can I do in the bedroom?  Use night lights.  Make sure that you have a light by your bed that is easy to reach.  Do not use any sheets or blankets that are too big for your bed. They should not hang down onto the floor.  Have a firm chair that has side arms. You can use this for support while you get dressed.  Do not have throw rugs and other things on the floor that can make you trip. What can I do in the kitchen?  Clean up any spills right away.  Avoid walking on wet floors.  Keep items that you use a lot in easy-to-reach places.  If you need to reach something above you, use a strong step stool that has a grab bar.  Keep electrical cords out of the way.  Do not use floor polish or wax that makes floors slippery. If you must use wax, use non-skid floor wax.  Do not have throw rugs and other things on the floor that can make you trip. What can I do with my stairs?  Do not leave any items on  the stairs.  Make sure that there are handrails on both sides of the stairs and use them. Fix handrails that are broken or loose. Make sure that handrails are as long as the stairways.  Check any carpeting to make sure that it is firmly attached to the stairs. Fix any carpet that is loose or worn.  Avoid having throw rugs at the top or bottom of the stairs. If you do have throw rugs, attach them to the floor with carpet tape.  Make sure that you have a light switch at the top of the stairs and the bottom of the stairs. If you do not have them, ask someone to add them for you. What else can I do to help prevent falls?  Wear shoes that:  Do not have high heels.  Have rubber bottoms.  Are comfortable and fit you well.  Are closed at the toe. Do not wear sandals.  If you use a stepladder:  Make sure that it is fully opened. Do not climb a closed stepladder.  Make sure that both sides of the stepladder are locked into place.  Ask someone to hold it for you,  if possible.  Clearly mark and make sure that you can see:  Any grab bars or handrails.  First and last steps.  Where the edge of each step is.  Use tools that help you move around (mobility aids) if they are needed. These include:  Canes.  Walkers.  Scooters.  Crutches.  Turn on the lights when you go into a dark area. Replace any light bulbs as soon as they burn out.  Set up your furniture so you have a clear path. Avoid moving your furniture around.  If any of your floors are uneven, fix them.  If there are any pets around you, be aware of where they are.  Review your medicines with your doctor. Some medicines can make you feel dizzy. This can increase your chance of falling. Ask your doctor what other things that you can do to help prevent falls. This information is not intended to replace advice given to you by your health care provider. Make sure you discuss any questions you have with your health care provider. Document Released: 09/25/2009 Document Revised: 05/06/2016 Document Reviewed: 01/03/2015 Elsevier Interactive Patient Education  2017 [employer].

## 2018-09-01 NOTE — Progress Notes (Signed)
Subjective:   [name] is a 84 y.o. female who presents for Medicare Annual (Subsequent) preventive examination at [facility]-08/23/2017       Objective:     Vitals: BP 138/74 (BP Location: Left Arm, Patient Position: Sitting)   Pulse 79   Temp 97.9 F (36.6 C) (Oral)   Ht 5\' 4"  (1.626 m)   Wt 166 lb (75.3 kg)   BMI 28.49 kg/m   Body mass index is 28.49 kg/m.  Advanced Directives 09/01/2018 08/15/2018 05/31/2018 03/02/2018 08/23/2017 08/22/2017 08/11/2017  Does Patient Have a Medical Advance Directive? Yes Yes Yes Yes Yes Yes Yes  Type of Paramedic of [place];Living will;Out of facility DNR (pink MOST or yellow form) [place];Living will;Out of facility DNR (pink MOST or yellow form) [place];Living will Living will;Out of facility DNR (pink MOST or yellow form);Healthcare Power of Attorney Living will;Out of facility DNR (pink MOST or yellow form) Living will;Out of facility DNR (pink MOST or yellow form) Living will  Does patient want to make changes to medical advance directive? No - Patient declined No - Patient declined No - Patient declined No - Patient declined No - Patient declined No - Patient declined No - Patient declined  Copy of [place] in Chart? Yes Yes Yes Yes - - -  Would patient like information on creating a medical advance directive? - - - - - - -  Pre-existing out of facility DNR order (yellow form or pink MOST form) Yellow form placed in chart (order not valid for inpatient use) Yellow form placed in chart (order not valid for inpatient use) - Yellow form placed in chart (order not valid for inpatient use) Yellow form placed in chart (order not valid for inpatient use);Pink MOST form placed in chart (order not valid for inpatient use) Yellow form placed in chart (order not valid for inpatient use) -    Tobacco Social History   Tobacco Use    Smoking Status Former Smoker  . Last attempt to quit: 12/13/1966  . Years since quitting: 51.7  Smokeless Tobacco Never Used     Counseling given: Not Answered   Clinical Intake:  Pre-visit preparation completed: No  Pain : No/denies pain     Diabetes: No  How often do you need to have someone help you when you read instructions, pamphlets, or other written materials from your doctor or pharmacy?: 1 - Never What is the last grade level you completed in school?: High School  Interpreter Needed?: No  Information entered by :: Tyson Dense, RN  Past Medical History:  Diagnosis Date  . Cervicalgia 08/12/2016  . CHF (congestive heart failure) ([place])   . Chronic diastolic CHF (congestive heart failure), NYHA class 3 ([place]) 06/17/2016  . Complication of anesthesia   . Edema 08/12/2016  . Hypertension   . New onset atrial fibrillation ([place]) 04/2016   a. started on Xarelto and Cardizem  . PONV (postoperative nausea and vomiting)   . RA (rheumatoid arthritis) ([place])   . SOB (shortness of breath) 04/2016   Hospitalist  . Vertigo    Past Surgical History:  Procedure Laterality Date  . Biopsy right breast Right    fibrocystic changes  . CARDIOVERSION N/A 07/08/2016   Procedure: CARDIOVERSION;  Surgeon: Larey Dresser, MD;  Location: [facility];  Service: Cardiovascular;  Laterality: N/A;  . CERVICAL SPINE SURGERY  2005   Dr.  Vertell Limber  . HYSTEROSCOPY  2001  . JOINT REPLACEMENT    . REPLACEMENT TOTAL [place] Bilateral 2005,2008,2009   Baird Lyons MD   Family History  Problem Relation Age of Onset  . Cancer Sister        brain  . Arthritis Daughter   . Hypothyroidism Daughter   . Hypertension Son   . Arthritis Son    Social History   Socioeconomic History  . Marital status: Widowed    Spouse name: Not on file  . Number of children: Not on file  . Years of education: Not on file  . Highest education level: Not on file   Occupational History  . Occupation: Retired  Scientific laboratory technician  . Financial resource strain: Not hard at all  . Food insecurity:    Worry: Never true    Inability: Never true  . Transportation needs:    Medical: No    Non-medical: No  Tobacco Use  . Smoking status: Former Smoker    Last attempt to quit: 12/13/1966    Years since quitting: 51.7  . Smokeless tobacco: Never Used  Substance and Sexual Activity  . Alcohol use: No  . Drug use: No  . Sexual activity: Never  Lifestyle  . Physical activity:    Days per week: 0 days    Minutes per session: 0 min  . Stress: Not at all  Relationships  . Social connections:    Talks on phone: More than three times a week    Gets together: More than three times a week    Attends religious service: Never    Active member of club or organization: No    Attends meetings of clubs or organizations: Never    Relationship status: Widowed  Other Topics Concern  . Not on file  Social History Narrative   Living in [place] since 05/09/2016   Widowed   Former smoked -stopped 1968   Alcohol none   Exercise -none. Sits in wheelchair using her feet to move around   DNR, POA, Living will    Outpatient Encounter Medications as of 09/01/2018  Medication Sig  . acetaminophen (TYLENOL) 500 MG tablet Take 500 mg by mouth 3 (three) times daily.  Marland Kitchen amoxicillin (AMOXIL) 500 MG capsule Take 2,000 mg by mouth as needed (1 hour prior to dental appointment).  . Calcium Carbonate (CALCIUM 600 PO) Take 600-900 mg by mouth. Take one tablet 600 mg in morning, take 1 1/2 tablets in evening for calcium   . dextromethorphan-guaiFENesin (MUCINEX DM) 30-600 MG 12hr tablet Take 1 tablet by mouth 2 (two) times daily as needed for cough.  . Diclofenac Sodium (PENNSAID) 2 % SOLN Place onto the skin. Apply sparingly topical to right side of neck as needed twice a day  . diltiazem (CARDIZEM CD) 120 MG 24 hr capsule Take 1 capsule (120 mg total) by mouth daily.  .  enalapril (VASOTEC) 20 MG tablet Take 20 mg by mouth 2 (two) times daily.    . furosemide (LASIX) 40 MG tablet Take 40 mg by mouth daily.   Marland Kitchen loratadine (CLARITIN) 10 MG tablet Take 10 mg by mouth daily.  . Magnesium Oxide 250 MG TABS Take 250 mg by mouth daily.  . metoprolol succinate (TOPROL-XL) 100 MG 24 hr tablet Take one tablet daily for blood pressure  . metoprolol succinate (TOPROL-XL) 50 MG 24 hr tablet Take 50 mg by mouth daily.   Marland Kitchen  OXYGEN Inhale 2 L into the lungs daily as needed (shortness of breath). Use 2 liter as needed for SOB, 02 sat < 90% on room air   . potassium chloride SA (K-DUR,KLOR-CON) 20 MEQ tablet Take 20 mEq by mouth daily.   . predniSONE (DELTASONE) 1 MG tablet Take 4 mg by mouth daily with breakfast.  . Rivaroxaban (XARELTO) 15 MG TABS tablet Take 15 mg by mouth daily with supper.  . vitamin C (ASCORBIC ACID) 500 MG tablet Take 500 mg by mouth daily.    . Wheat Dextrin (BENEFIBER PO) Take 4 scoop by mouth daily.    No facility-administered encounter medications on file as of 09/01/2018.     Activities of Daily Living In your present state of health, do you have any difficulty performing the following activities: 09/01/2018  Hearing? N  Vision? N  Difficulty concentrating or making decisions? N  Walking or climbing stairs? Y  Dressing or bathing? N  Doing errands, shopping? N  Preparing Food and eating ? Y  Using the Toilet? N  In the past six months, have you accidently leaked urine? Y  Do you have problems with loss of bowel control? N  Managing your Medications? Y  Managing your Finances? Y  Housekeeping or managing your Housekeeping? Y  Some recent data might be hidden    Patient Care Team: Blanchie Serve, MD as PCP - General (Internal Medicine) Mast, Man X, NP as Nurse Practitioner (Internal Medicine) [place], Peter M, MD as Consulting Physician (Cardiology) Lavonna Monarch, MD as Consulting Physician (Dermatology) Garald Balding, MD as  Consulting Physician (Orthopedic Surgery)    Assessment:   This is a routine wellness examination for [place].  Exercise Activities and Dietary recommendations Current Exercise Habits: The patient does not participate in regular exercise at present, Exercise limited by: orthopedic condition(s)  Goals   None     Fall Risk Fall Risk  09/01/2018 08/23/2017 08/12/2016  Falls in the past year? No No No   Is the patient's home free of loose throw rugs in walkways, pet beds, electrical cords, etc?   yes      Grab bars in the bathroom? yes      Handrails on the stairs?   yes      Adequate lighting?   yes  Depression Screen PHQ 2/9 Scores 09/01/2018 08/23/2017 08/12/2016 06/20/2015  PHQ - 2 Score 0 0 0 0     Cognitive Function MMSE - Mini Mental State Exam 09/01/2018 08/23/2017  Orientation to time 4 5  Orientation to Place 5 5  Registration 3 3  Attention/ Calculation 5 5  Recall 2 3  Language- name 2 objects 2 2  Language- repeat 1 1  Language- follow 3 step command 3 3  Language- read & follow direction 1 1  Write a sentence 1 1  Copy design 1 1  Total score 28 30        Immunization History  Administered Date(s) Administered  . Influenza-Unspecified 09/13/2015, 09/29/2016, 10/03/2017  . Pneumococcal Conjugate-13 10/03/2017  . Pneumococcal-Unspecified 05/14/2015  . Tdap 04/13/2011    Qualifies for Shingles Vaccine? Not in past records  Screening Tests Health Maintenance  Topic Date Due  . INFLUENZA VACCINE  07/13/2018  . TETANUS/TDAP  04/12/2021  . DEXA SCAN  Completed  . PNA vac Low Risk Adult  Completed    Cancer Screenings: Lung: Low Dose CT Chest recommended if Age 55-80 years, 30 pack-year currently smoking OR have quit w/in 15years. Patient  does not qualify. Breast:  Up to date on Mammogram? Yes   Up to date of Bone Density/Dexa? Yes Colorectal: up to date  Additional Screenings:  Hepatitis C Screening: declined Flu vaccine due: will receive at [facility]:    I have personally reviewed and addressed the Medicare Annual Wellness questionnaire and have noted the following in the patient's chart:  A. Medical and social history B. Use of alcohol, tobacco or illicit drugs  C. Current medications and supplements D. Functional ability and status E.  Nutritional status F.  Physical activity G. Advance directives H. List of other physicians I.  Hospitalizations, surgeries, and ER visits in previous 12 months J.  [place] to include hearing, vision, cognitive, depression L. Referrals and appointments - none  In addition, I have reviewed and discussed with patient certain preventive protocols, quality metrics, and best practice recommendations. A written personalized care plan for preventive services as well as general preventive health recommendations were provided to patient.  See attached scanned questionnaire for additional information.   Signed,   Tyson Dense, RN Nurse Health Advisor  Patient Concerns: None

## 2018-09-07 ENCOUNTER — Non-Acute Institutional Stay: Payer: PPO | Admitting: Internal Medicine

## 2018-09-07 ENCOUNTER — Encounter: Payer: Self-pay | Admitting: Internal Medicine

## 2018-09-07 DIAGNOSIS — I5032 Chronic diastolic (congestive) heart failure: Secondary | ICD-10-CM

## 2018-09-07 DIAGNOSIS — M069 Rheumatoid arthritis, unspecified: Secondary | ICD-10-CM | POA: Diagnosis not present

## 2018-09-07 DIAGNOSIS — K5901 Slow transit constipation: Secondary | ICD-10-CM

## 2018-09-07 DIAGNOSIS — Z7901 Long term (current) use of anticoagulants: Secondary | ICD-10-CM

## 2018-09-07 DIAGNOSIS — I480 Paroxysmal atrial fibrillation: Secondary | ICD-10-CM | POA: Diagnosis not present

## 2018-09-07 NOTE — Progress Notes (Signed)
Location:  [facility] Room Number: AL 918 Place of Service:  ALF [phone]) Provider:  Blanchie Serve MD  Blanchie Serve, MD  Patient Care Team: Blanchie Serve, MD as PCP - General (Internal Medicine) Mast, Man X, NP as Nurse Practitioner (Internal Medicine) [place], Peter M, MD as Consulting Physician (Cardiology) Lavonna Monarch, MD as Consulting Physician (Dermatology) Garald Balding, MD as Consulting Physician (Orthopedic Surgery)  Extended Emergency Contact Information Primary Emergency Contact: Berneta Levins Address: [address] [place], [place] [address] Johnnette Litter of [place] Phone: [phone] Work Phone: [phone] Mobile Phone: [phone] Relation: Daughter Secondary Emergency Contact: Azalee Course [place] of [place] Phone: [phone] Mobile Phone: [phone] Relation: Son  Code Status:  DNR  Goals of care: Advanced Directive information Advanced Directives 09/07/2018  Does Patient Have a Medical Advance Directive? Yes  Type of Advance Directive Living will;Out of facility DNR (pink MOST or yellow form)  Does patient want to make changes to medical advance directive? -  Copy of [place] in Chart? -  Would patient like information on creating a medical advance directive? -  Pre-existing out of facility DNR order (yellow form or pink MOST form) Yellow form placed in chart (order not valid for inpatient use)     Chief Complaint  Patient presents with  . Medical Management of Chronic Issues    Resident is being seen for a routine visit.    HPI:  Pt is a 84 y.o. female seen today for medical management of chronic diseases. She is seen in her room today with charge nurse at bedside. She denies any concern. BP reading have been stable on review. Takes metoprolol succinate 100 mg am and 50 mg pm, enalapril 20 mg bid and lasix 40 mg daily. Tolerating calcium supplement well. She takes  tylenol 500 mg tid for OA. She is on chronic prednisone. Tolerating cardizem and rivaroxaban well. Pending dental appointment for possible tooth extraction next month.    Past Medical History:  Diagnosis Date  . Cervicalgia 08/12/2016  . CHF (congestive heart failure) ([place])   . Chronic diastolic CHF (congestive heart failure), NYHA class 3 ([place]) 06/17/2016  . Complication of anesthesia   . Edema 08/12/2016  . Hypertension   . New onset atrial fibrillation ([place]) 04/2016   a. started on Xarelto and Cardizem  . PONV (postoperative nausea and vomiting)   . RA (rheumatoid arthritis) ([place])   . SOB (shortness of breath) 04/2016   Hospitalist  . Vertigo    Past Surgical History:  Procedure Laterality Date  . Biopsy right breast Right    fibrocystic changes  . CARDIOVERSION N/A 07/08/2016   Procedure: CARDIOVERSION;  Surgeon: Larey Dresser, MD;  Location: [facility];  Service: Cardiovascular;  Laterality: N/A;  . CERVICAL SPINE SURGERY  2005   Dr. Vertell Limber  . HYSTEROSCOPY  2001  . JOINT REPLACEMENT    . REPLACEMENT TOTAL [place] Right 1996   [place] Bilateral [phone]   Baird Lyons MD    Allergies  Allergen Reactions  . Morphine Sulfate Other (See Comments)    "extremely nauseated"  . Other Nausea And Vomiting    Pt states she is very sensitive to pain medications.    Outpatient Encounter Medications as of 09/07/2018  Medication Sig  . acetaminophen (TYLENOL) 500 MG tablet Take 500 mg by mouth 3 (three) times daily.  Marland Kitchen amoxicillin (AMOXIL)  500 MG capsule Take 2,000 mg by mouth as needed (1 hour prior to dental appointment).  . Calcium Carbonate (CALCIUM 600 PO) Take 600-900 mg by mouth. Take one tablet 600 mg in morning, take 1 1/2 tablets in evening for calcium   . cephALEXin (KEFLEX) 500 MG capsule Administer 2 caps (500 mg to equal 1000 mg) by mouth at 8:30 am ( one hour before 9:30 dental appointment.)  . dextromethorphan-guaiFENesin (MUCINEX  DM) 30-600 MG 12hr tablet Take 1 tablet by mouth 2 (two) times daily as needed for cough.  . Diclofenac Sodium (PENNSAID) 2 % SOLN Place onto the skin. Apply sparingly topical to right side of neck as needed twice a day  . diltiazem (CARDIZEM CD) 120 MG 24 hr capsule Take 1 capsule (120 mg total) by mouth daily.  . enalapril (VASOTEC) 20 MG tablet Take 20 mg by mouth 2 (two) times daily.    . furosemide (LASIX) 40 MG tablet Take 40 mg by mouth daily.   Marland Kitchen loratadine (CLARITIN) 10 MG tablet Take 10 mg by mouth daily.  . Magnesium Oxide 250 MG TABS Take 250 mg by mouth daily.  . metoprolol succinate (TOPROL-XL) 100 MG 24 hr tablet Take one tablet daily for blood pressure  . metoprolol succinate (TOPROL-XL) 50 MG 24 hr tablet Take 50 mg by mouth daily.   . OXYGEN Inhale 2 L into the lungs daily as needed (shortness of breath). Use 2 liter as needed for SOB, 02 sat < 90% on room air   . potassium chloride SA (K-DUR,KLOR-CON) 20 MEQ tablet Take 20 mEq by mouth daily.   . predniSONE (DELTASONE) 1 MG tablet Take 4 mg by mouth daily with breakfast.  . Rivaroxaban (XARELTO) 15 MG TABS tablet Take 15 mg by mouth daily with supper.  . vitamin C (ASCORBIC ACID) 500 MG tablet Take 500 mg by mouth daily.    . Wheat Dextrin (BENEFIBER PO) Take 4 scoop by mouth daily.    No facility-administered encounter medications on file as of 09/07/2018.     Review of Systems  Constitutional: Negative for appetite change, chills, diaphoresis and fever.  HENT: Negative for congestion, hearing loss, mouth sores, rhinorrhea, sinus pressure and sinus pain.   Eyes: Positive for visual disturbance.  Respiratory: Negative for cough, shortness of breath and wheezing.   Cardiovascular: Negative for chest pain and palpitations.       Improved leg edema  Gastrointestinal: Negative for abdominal pain, constipation, diarrhea, nausea and vomiting.  Genitourinary: Negative for dysuria, hematuria and pelvic pain.  Musculoskeletal:  Positive for arthralgias and gait problem. Negative for back pain.  Skin: Negative for rash and wound.  Neurological: Negative for syncope, light-headedness and headaches.  Psychiatric/Behavioral: Negative for behavioral problems and confusion.    Immunization History  Administered Date(s) Administered  . Influenza-Unspecified 09/13/2015, 09/29/2016, 10/03/2017  . Pneumococcal Conjugate-13 10/03/2017  . Pneumococcal-Unspecified 05/14/2015  . Tdap 04/13/2011   Pertinent  Health Maintenance Due  Topic Date Due  . INFLUENZA VACCINE  10/12/2018 (Originally 07/13/2018)  . DEXA SCAN  Completed  . PNA vac Low Risk Adult  Completed   Fall Risk  09/01/2018 08/23/2017 08/12/2016  Falls in the past year? No No No   Functional Status Survey:    Vitals:   09/07/18 1100  BP: 120/64  Pulse: 68  Resp: 18  Temp: 97.8 F (36.6 C)  TempSrc: Oral  SpO2: 97%  Weight: 162 lb 3.2 oz (73.6 kg)  Height: 5\' 4"  (1.626 m)  Body mass index is 27.84 kg/m.   Wt Readings from Last 3 Encounters:  09/07/18 162 lb 3.2 oz (73.6 kg)  09/01/18 166 lb (75.3 kg)  08/15/18 166 lb (75.3 kg)   Physical Exam  Constitutional: She is oriented to person, place, and time. No distress.  Overweight elder female  HENT:  Head: Normocephalic and atraumatic.  Nose: Nose normal.  Mouth/Throat: Oropharynx is clear and moist. No oropharyngeal exudate.  Eyes: Pupils are equal, round, and reactive to light. Conjunctivae and EOM are normal. Right eye exhibits no discharge. Left eye exhibits no discharge.  Neck: Normal range of motion. Neck supple.  Cardiovascular: Normal rate and regular rhythm.  Pulmonary/Chest: Effort normal and breath sounds normal. No respiratory distress. She has no wheezes. She has no rales.  Abdominal: Soft. Bowel sounds are normal.  Musculoskeletal: Normal range of motion. She exhibits edema and deformity.  Lymphadenopathy:    She has no cervical adenopathy.  Neurological: She is alert and  oriented to person, place, and time.  Skin: Skin is warm. She is not diaphoretic.  Psychiatric: She has a normal mood and affect.    Labs reviewed: Recent Labs    12/27/17 06/01/18 08/22/18  NA 136* 139 138  K 4.0 4.1 4.0  CL 98 100 101  CO2 30 31 28   BUN 21 25* 25*  CREATININE 0.8 0.9 0.9  CALCIUM 10.0 9.4 9.5   Recent Labs    11/10/17 12/27/17 06/01/18  AST 21 20 18   ALT 13 14 22   ALKPHOS 45 42 49  PROT  --  6.0  --   ALBUMIN  --  4.2 4.1   Recent Labs    11/10/17 12/27/17  WBC 7.2 7.0  HGB 14.2 14.4  HCT 41 41  PLT 208 199   Lab Results  Component Value Date   TSH 1.82 03/07/2018   Lab Results  Component Value Date   HGBA1C 5.4 05/17/2017   Lab Results  Component Value Date   CHOL 185 03/07/2018   HDL 73 (A) 03/07/2018   LDLCALC 86 03/07/2018   TRIG 158 03/07/2018   CHOLHDL 2.4 05/04/2016    Significant Diagnostic Results in last 30 days:  No results found.  Assessment/Plan  1. Paroxysmal atrial fibrillation (HCC) Controlled HR. Continue cardizem and metoprolol succinate. Continue xarelto for stroke prevention. Check cbc  2. Chronic diastolic CHF (congestive heart failure), NYHA class 3 (HCC) Continue b blocker and lasix with enalapril. Reviewed recent bmp, continue kcl supplement  3. Rheumatoid arthritis of wrist, unspecified laterality, unspecified rheumatoid factor presence (HCC) Continue chronic prednisone and her tylenol regimen. No flare up. Check cbc  4. Chronic anticoagulation continue xarelto, monitor for bleed  5. Slow transit constipation Continue hydration, monitor bowel movement    Family/ staff Communication: reviewed care plan with patient and charge nurse.    Labs/tests ordered:  CBC, TSH   Blanchie Serve, MD Internal Medicine [facility] [address] [place], [place] [address] Cell Phone (Monday-Friday 8 am - 5 pm): [phone] On Call: [phone] and follow prompts after 5 pm  and on weekends Office Phone: [phone] Office Fax: [phone]

## 2018-09-12 DIAGNOSIS — I480 Paroxysmal atrial fibrillation: Secondary | ICD-10-CM | POA: Diagnosis not present

## 2018-09-12 DIAGNOSIS — M069 Rheumatoid arthritis, unspecified: Secondary | ICD-10-CM | POA: Diagnosis not present

## 2018-09-12 LAB — CBC AND DIFFERENTIAL
HCT: 39 (ref 36–46)
Hemoglobin: 13.4 (ref 12.0–16.0)
PLATELETS: 183 (ref 150–399)
WBC: 6.9

## 2018-09-13 ENCOUNTER — Other Ambulatory Visit: Payer: Self-pay | Admitting: *Deleted

## 2018-10-07 NOTE — Progress Notes (Signed)
10/17/2018 [name]   Dec 29, 1927  [id number]  Primary Physician Blanchie Serve, MD Primary Cardiologist: Dr Jacci Ruberg Martinique MD  HPI:  84 y/o female seen for follow up Afib.  Her daughter, (a former Therapist, sports on the renal unit) is with her. She presented in  May 2017. She started to complain of dyspnea which was fairly rapid in onset. She presented to the ED and was noted to be in AF and CHF.  She was placed on Xarelto and diuresed. Her beta blocker was adjusted. She underwent OP DCCV 07/08/16 but failed to hold NSR and was back in AF with CVR by the end of August. She was seen in the fall with complaints of new LE edema with weeping areas.  Her lasix dose was increased to 40 mg daily with improvement. Echo showed normal LV function, biatrial enlargement, pulmonary HTN.   On follow up today she states she is doing well. Her activity is quite limited. She only walks to dinner once a day. The rest of the time she gets around in her wheelchair.  Weight has been stable. No increased edema. Wears support hose. She denies any SOB but her daughter notes she is more SOB when making transfers. BP at [facility] has been well controlled.    Current Outpatient Medications  Medication Sig Dispense Refill  . acetaminophen (TYLENOL) 500 MG tablet Take 500 mg by mouth 3 (three) times daily.    Marland Kitchen amoxicillin (AMOXIL) 500 MG capsule Take 2,000 mg by mouth as needed (1 hour prior to dental appointment).    . Calcium Carbonate (CALCIUM 600 PO) Take 600-900 mg by mouth. Take one tablet 600 mg in morning, take 1 1/2 tablets in evening for calcium     . cephALEXin (KEFLEX) 500 MG capsule Administer 2 caps (500 mg to equal 1000 mg) by mouth at 8:30 am ( one hour before 9:30 dental appointment.)    . dextromethorphan-guaiFENesin (MUCINEX DM) 30-600 MG 12hr tablet Take 1 tablet by mouth 2 (two) times daily as needed for cough.    . Diclofenac Sodium (PENNSAID) 2 % SOLN Place onto the skin. Apply sparingly topical to  right side of neck as needed twice a day    . diltiazem (CARDIZEM CD) 120 MG 24 hr capsule Take 1 capsule (120 mg total) by mouth daily. 90 capsule 3  . enalapril (VASOTEC) 20 MG tablet Take 20 mg by mouth 2 (two) times daily.      . furosemide (LASIX) 40 MG tablet Take 40 mg by mouth daily.     Marland Kitchen loratadine (CLARITIN) 10 MG tablet Take 10 mg by mouth daily.    . Magnesium Oxide 250 MG TABS Take 250 mg by mouth daily.    . metoprolol succinate (TOPROL-XL) 100 MG 24 hr tablet Take one tablet daily for blood pressure    . metoprolol succinate (TOPROL-XL) 50 MG 24 hr tablet Take 50 mg by mouth daily.     . OXYGEN Inhale 2 L into the lungs daily as needed (shortness of breath). Use 2 liter as needed for SOB, 02 sat < 90% on room air     . potassium chloride SA (K-DUR,KLOR-CON) 20 MEQ tablet Take 20 mEq by mouth daily.     . predniSONE (DELTASONE) 1 MG tablet Take 4 mg by mouth daily with breakfast.    . Rivaroxaban (XARELTO) 15 MG TABS tablet Take 15 mg by mouth daily with supper.    . vitamin C (ASCORBIC ACID)  500 MG tablet Take 500 mg by mouth daily.      . Wheat Dextrin (BENEFIBER PO) Take 4 scoop by mouth daily.      No current facility-administered medications for this visit.     Allergies  Allergen Reactions  . Morphine Sulfate Other (See Comments)    "extremely nauseated"  . Other Nausea And Vomiting    Pt states she is very sensitive to pain medications.    Social History   Socioeconomic History  . Marital status: Widowed    Spouse name: Not on file  . Number of children: Not on file  . Years of education: Not on file  . Highest education level: Not on file  Occupational History  . Occupation: Retired  Scientific laboratory technician  . Financial resource strain: Not hard at all  . Food insecurity:    Worry: Never true    Inability: Never true  . Transportation needs:    Medical: No    Non-medical: No  Tobacco Use  . Smoking status: Former Smoker    Last attempt to quit: 12/13/1966     Years since quitting: 51.8  . Smokeless tobacco: Never Used  Substance and Sexual Activity  . Alcohol use: No  . Drug use: No  . Sexual activity: Never  Lifestyle  . Physical activity:    Days per week: 0 days    Minutes per session: 0 min  . Stress: Not at all  Relationships  . Social connections:    Talks on phone: More than three times a week    Gets together: More than three times a week    Attends religious service: Never    Active member of club or organization: No    Attends meetings of clubs or organizations: Never    Relationship status: Widowed  . Intimate partner violence:    Fear of current or ex partner: No    Emotionally abused: No    Physically abused: No    Forced sexual activity: No  Other Topics Concern  . Not on file  Social History Narrative   Living in [place] since 05/09/2016   Widowed   Former smoked -stopped 1968   Alcohol none   Exercise -none. Sits in wheelchair using her feet to move around   DNR, POA, Living will     Review of Systems: As noted in HPI All other systems reviewed and are otherwise negative except as noted above. She did have a recent dental extraction.  Blood pressure (!) 167/67, pulse 73, height 5\' 4"  (1.626 m), weight 164 lb 9.6 oz (74.7 kg).  GENERAL:  Well appearing WF in NAD, she is in a wheelchair.  HEENT:  PERRL, EOMI, sclera are clear. Oropharynx is clear. NECK:  No jugular venous distention, carotid upstroke brisk and symmetric, no bruits, no thyromegaly or adenopathy LUNGS:  Clear to auscultation bilaterally CHEST:  Unremarkable HEART:  IRRR,  PMI not displaced or sustained,S1 and S2 within normal limits, no S3, no S4: no clicks, no rubs, no murmurs ABD:  Soft, nontender. BS +, no masses or bruits. No hepatomegaly, no splenomegaly EXT:  2 + pulses throughout, no edema- compression socks in place, no cyanosis no clubbing SKIN:  Warm and dry.  No rashes NEURO:  Alert and oriented x 3. Cranial nerves II  through XII intact. PSYCH:  Cognitively intact      Lab Results  Component Value Date   WBC 6.9 09/12/2018   HGB 13.4 09/12/2018  HCT 39 09/12/2018   PLT 183 09/12/2018   GLUCOSE 109 (H) 08/12/2016   CHOL 185 03/07/2018   TRIG 158 03/07/2018   HDL 73 (A) 03/07/2018   LDLCALC 86 03/07/2018   ALT 22 06/01/2018   AST 18 06/01/2018   NA 138 08/22/2018   K 4.0 08/22/2018   CL 101 08/22/2018   CREATININE 0.9 08/22/2018   BUN 25 (A) 08/22/2018   CO2 28 08/22/2018   TSH 1.82 03/07/2018   INR 1.23 05/03/2016   HGBA1C 5.4 05/17/2017   Ecg today shows Afib with rate 69. Poor R wave progression. I have personally reviewed and interpreted this study.  Echo: 10/08/16:  Study Conclusions  - Left ventricle: The cavity size was normal. Systolic function was   normal. The estimated ejection fraction was in the range of 60%   to 65%. Wall motion was normal; there were no regional wall   motion abnormalities. - Aortic valve: There was trivial regurgitation. - Mitral valve: Calcified annulus. Mildly thickened leaflets .   There was mild regurgitation. - Left atrium: The atrium was moderately dilated. - Right atrium: The atrium was moderately dilated. - Tricuspid valve: There was mild-moderate regurgitation. There was   evidence of perivalvular regurgitation. - Pulmonary arteries: Systolic pressure was moderately increased.   PA peak pressure: 54 mm Hg (S).  ASSESSMENT AND PLAN:  1. Atrial fibrillation-  permanent. Rate is well controlled. She tolerates this quite well.  On Xarelto for anticoagulation. Continue metoprolol and diltiazem for rate control 2. Chronic diastolic CHF. She is well compensated on lasix 40 mg dialy.  Weight is stable and she has 1+ edema.  Will continue current therapy. Continue sodium restriction and support hose.   3. RA 4. HTN- controlled based on records from [facility].   Follow up in 6 months.  Timara Loma Martinique MD,FACC 10/17/2018 4:12 PM

## 2018-10-17 ENCOUNTER — Ambulatory Visit (INDEPENDENT_AMBULATORY_CARE_PROVIDER_SITE_OTHER): Payer: PPO | Admitting: Cardiology

## 2018-10-17 ENCOUNTER — Encounter: Payer: Self-pay | Admitting: Cardiology

## 2018-10-17 VITALS — BP 167/67 | HR 73 | Ht 64.0 in | Wt 164.6 lb

## 2018-10-17 DIAGNOSIS — I4819 Other persistent atrial fibrillation: Secondary | ICD-10-CM

## 2018-10-17 DIAGNOSIS — I10 Essential (primary) hypertension: Secondary | ICD-10-CM | POA: Diagnosis not present

## 2018-10-17 DIAGNOSIS — I5032 Chronic diastolic (congestive) heart failure: Secondary | ICD-10-CM

## 2018-10-17 NOTE — Patient Instructions (Signed)
Continue your current therapy  I will see you in 6 months.   

## 2018-11-07 ENCOUNTER — Encounter (INDEPENDENT_AMBULATORY_CARE_PROVIDER_SITE_OTHER): Payer: Self-pay | Admitting: Orthopaedic Surgery

## 2018-11-07 ENCOUNTER — Ambulatory Visit (INDEPENDENT_AMBULATORY_CARE_PROVIDER_SITE_OTHER): Payer: PPO | Admitting: Orthopaedic Surgery

## 2018-11-07 VITALS — BP 138/79 | HR 74 | Ht 60.0 in | Wt 160.0 lb

## 2018-11-07 DIAGNOSIS — M542 Cervicalgia: Secondary | ICD-10-CM | POA: Diagnosis not present

## 2018-11-07 MED ORDER — METHYLPREDNISOLONE ACETATE 40 MG/ML IJ SUSP
40.0000 mg | INTRAMUSCULAR | Status: AC | PRN
  Administered 2018-11-07: 40 mg via INTRAMUSCULAR

## 2018-11-07 MED ORDER — LIDOCAINE HCL 1 % IJ SOLN
2.0000 mL | INTRAMUSCULAR | Status: AC | PRN
  Administered 2018-11-07: 2 mL

## 2018-11-07 NOTE — Progress Notes (Signed)
Office Visit Note   Patient: [name]           Date of Birth: 1928-11-07           MRN: [id number] Visit Date: 11/07/2018              Requested by: Blanchie Serve, MD No address on file PCP: Blanchie Serve, MD   Assessment & Plan: Visit Diagnoses:  1. Cervicalgia     Plan: Recurrent trigger point tenderness right side of neck that has responded to injections in the past.  Will inject with Xylocaine and Depo-Medrol.  Excellent response postinjection  Follow-Up Instructions: Return if symptoms worsen or fail to improve.   Orders:  Orders Placed This Encounter  Procedures  . Trigger Point Inj   No orders of the defined types were placed in this encounter.     Procedures: Trigger Point Inj Date/Time: 11/07/2018 3:28 PM Performed by: Katherine Balding, MD Authorized by: Katherine Balding, MD   Indications:  Muscle spasm and pain Total # of Trigger Points:  1 Location: neck   Needle Size:  27 G Approach:  Lateral Medications #1:  2 mL lidocaine 1 %; 40 mg methylPREDNISolone acetate 40 MG/ML     Clinical Data: No additional findings.   Subjective: Chief Complaint  Patient presents with  . Neck - Pain  . Torticollis    on going neck pain hurts when she turns her head to the right,having on going numbness   [name] has been seen on several occasions in the past for recurrent right-sided neck pain.  On each occasion she is responded to a local cortisone injection for her trigger point tenderness.  She is had recurrence of similar pain within the last several weeks and would like to have another injection. HPI  Review of Systems  Constitutional: Negative.   HENT: Negative.   Eyes: Negative.   Respiratory: Negative.   Cardiovascular: Negative.   Gastrointestinal: Negative.   Endocrine: Negative.   Genitourinary: Negative.   Musculoskeletal: Positive for gait problem and neck pain.  Skin: Negative.   Neurological: Positive for weakness and  numbness.  Hematological: Negative.      Objective: Vital Signs: BP 138/79 (BP Location: Left Arm)   Pulse 74   Ht 5' (1.524 m)   Wt 160 lb (72.6 kg)   BMI 31.25 kg/m   Physical Exam  Constitutional: She is oriented to person, place, and time. She appears well-developed and well-nourished.  HENT:  Mouth/Throat: Oropharynx is clear and moist.  Eyes: Pupils are equal, round, and reactive to light. EOM are normal.  Pulmonary/Chest: Effort normal.  Neurological: She is alert and oriented to person, place, and time.  Skin: Skin is warm and dry.  Psychiatric: She has a normal mood and affect. Her behavior is normal.    Ortho Exam awake alert and oriented x3.  Comfortable sitting.  Accompanied by her daughter.  Evaluated in a wheelchair.  Having local tenderness along the posterior cervical neck musculature to the right.  Pain is localized posterior to the midline.  No masses.  Is have some pain with range of motion of her cervical spine to the arthritis.  After injection of the area of trigger point tenderness she had resolution of her discomfort.  Has chronic numbness in her right hand which was not changed  Specialty Comments:  No specialty comments available.  Imaging: No results found.   PMFS History: Patient Active Problem List   Diagnosis  Date Noted  . Unsteady gait 03/02/2018  . Left shoulder pain 12/26/2017  . Constipation 03/03/2017  . Depression with anxiety 01/26/2017  . Hyperlipidemia 12/27/2016  . Edema leg 09/22/2016  . Keratoacanthoma 08/12/2016  . Osteoarthritis 08/12/2016  . Cervicalgia 08/12/2016  . Chronic anticoagulation 07/29/2016  . Chronic diastolic CHF (congestive heart failure), NYHA class 3 ([place]) 06/17/2016  . Atrial fibrillation ([place]) 05/03/2016  . Abnormal chest CT 05/03/2016  . Syncope 11/03/2014  . Essential hypertension 11/03/2014  . Rheumatoid arthritis ([place]) 11/03/2014   Past Medical History:  Diagnosis Date  . Cervicalgia 08/12/2016    . CHF (congestive heart failure) ([place])   . Chronic diastolic CHF (congestive heart failure), NYHA class 3 ([place]) 06/17/2016  . Complication of anesthesia   . Edema 08/12/2016  . Hypertension   . New onset atrial fibrillation ([place]) 04/2016   a. started on Xarelto and Cardizem  . PONV (postoperative nausea and vomiting)   . RA (rheumatoid arthritis) ([place])   . SOB (shortness of breath) 04/2016   Hospitalist  . Vertigo     Family History  Problem Relation Age of Onset  . Cancer Sister        brain  . Arthritis Daughter   . Hypothyroidism Daughter   . Hypertension Son   . Arthritis Son     Past Surgical History:  Procedure Laterality Date  . Biopsy right breast Right    fibrocystic changes  . CARDIOVERSION N/A 07/08/2016   Procedure: CARDIOVERSION;  Surgeon: Larey Dresser, MD;  Location: [facility];  Service: Cardiovascular;  Laterality: N/A;  . CERVICAL SPINE SURGERY  2005   Dr. Vertell Limber  . HYSTEROSCOPY  2001  . JOINT REPLACEMENT    . REPLACEMENT TOTAL KNEE Right 1996   [place] Bilateral [phone]   Baird Lyons MD   Social History   Occupational History  . Occupation: Retired  Tobacco Use  . Smoking status: Former Smoker    Last attempt to quit: 12/13/1966    Years since quitting: 51.9  . Smokeless tobacco: Never Used  Substance and Sexual Activity  . Alcohol use: No  . Drug use: No  . Sexual activity: Never

## 2018-12-04 ENCOUNTER — Non-Acute Institutional Stay: Payer: PPO | Admitting: Nurse Practitioner

## 2018-12-04 ENCOUNTER — Encounter: Payer: Self-pay | Admitting: Nurse Practitioner

## 2018-12-04 DIAGNOSIS — I5032 Chronic diastolic (congestive) heart failure: Secondary | ICD-10-CM | POA: Diagnosis not present

## 2018-12-04 DIAGNOSIS — I480 Paroxysmal atrial fibrillation: Secondary | ICD-10-CM

## 2018-12-04 DIAGNOSIS — I10 Essential (primary) hypertension: Secondary | ICD-10-CM | POA: Diagnosis not present

## 2018-12-04 DIAGNOSIS — J309 Allergic rhinitis, unspecified: Secondary | ICD-10-CM | POA: Insufficient documentation

## 2018-12-04 DIAGNOSIS — M069 Rheumatoid arthritis, unspecified: Secondary | ICD-10-CM | POA: Diagnosis not present

## 2018-12-04 DIAGNOSIS — M542 Cervicalgia: Secondary | ICD-10-CM | POA: Diagnosis not present

## 2018-12-04 NOTE — Assessment & Plan Note (Signed)
08/14/16 X-ray C spine: anterior interbody fusion cervical spine, severe degenerative disc disease C7-T1, has shoulder prosthesis.  Right side neck pain, s/p inj 3 months ago, continue Tylenol 500mg  tid, adding Lidoderm patch on 12hr, off 12hr

## 2018-12-04 NOTE — Assessment & Plan Note (Signed)
Blood pressure is controlled, continue Metoprolol 150mg  qd, Diltiazem 120mg  qd, Enalapril 20mg  bid.

## 2018-12-04 NOTE — Assessment & Plan Note (Signed)
Compensated clinically, continue Furosemide 40mg qd.  

## 2018-12-04 NOTE — Progress Notes (Signed)
Location:  [facility] Room Number: 161 Place of Service:  ALF [phone]) Provider:  Ahmira Boisselle, ManXie  NP  Blanchie Serve, MD (Inactive)  Patient Care Team: Blanchie Serve, MD (Inactive) as PCP - General (Internal Medicine) Cheryln Balcom X, NP as Nurse Practitioner (Internal Medicine) Martinique, Peter M, MD as Consulting Physician (Cardiology) Lavonna Monarch, MD as Consulting Physician (Dermatology) Garald Balding, MD as Consulting Physician (Orthopedic Surgery)  Extended Emergency Contact Information Primary Emergency Contact: Berneta Levins Address: [address] [place], [place] [address] Johnnette Litter of [place] Phone: [phone] Work Phone: [phone] Mobile Phone: [phone] Relation: Daughter Secondary Emergency Contact: Azalee Course [place] of [place] Phone: [phone] Mobile Phone: [phone] Relation: Son  Code Status:  DNR Goals of care: Advanced Directive information Advanced Directives 12/04/2018  Does Patient Have a Medical Advance Directive? Yes  Type of Advance Directive Out of facility DNR (pink MOST or yellow form);Living will  Does patient want to make changes to medical advance directive? No - Patient declined  Copy of [place] in Chart? -  Would patient like information on creating a medical advance directive? -  Pre-existing out of facility DNR order (yellow form or pink MOST form) Yellow form placed in chart (order not valid for inpatient use)     Chief Complaint  Patient presents with  . Medical Management of Chronic Issues    HPI:  Pt is a 84 y.o. female seen today for medical management of chronic diseases.     The patient has history of CHF, compensated clinically on Furosemide 40mg  qd. Afib, heart rate is in control, on Rivaroxaban 15mg  qd, on Metoprolol 150mg  qd, Diltiazem 120mg  qd. HTN, blood pressure is controlled on Enalapril 20mg  bid, Diltiazem 120mg  qd, Metoprolol 150mg   qd. Allergic rhinitis, stable on Loratadine 10mg  qd.  RA, stable on Prednisone 4 mg qd, Tylenol 500mg  tid.  Past Medical History:  Diagnosis Date  . Cervicalgia 08/12/2016  . CHF (congestive heart failure) ([place])   . Chronic diastolic CHF (congestive heart failure), NYHA class 3 ([place]) 06/17/2016  . Complication of anesthesia   . Edema 08/12/2016  . Hypertension   . New onset atrial fibrillation ([place]) 04/2016   a. started on Xarelto and Cardizem  . PONV (postoperative nausea and vomiting)   . RA (rheumatoid arthritis) ([place])   . SOB (shortness of breath) 04/2016   Hospitalist  . Vertigo    Past Surgical History:  Procedure Laterality Date  . Biopsy right breast Right    fibrocystic changes  . CARDIOVERSION N/A 07/08/2016   Procedure: CARDIOVERSION;  Surgeon: Larey Dresser, MD;  Location: [facility];  Service: Cardiovascular;  Laterality: N/A;  . CERVICAL SPINE SURGERY  2005   Dr. Vertell Limber  . HYSTEROSCOPY  2001  . JOINT REPLACEMENT    . REPLACEMENT TOTAL KNEE Right 1996   [place] Bilateral [phone]   Baird Lyons MD    Allergies  Allergen Reactions  . Morphine Sulfate Other (See Comments)    "extremely nauseated"  . Other Nausea And Vomiting    Pt states she is very sensitive to pain medications.    Outpatient Encounter Medications as of 12/04/2018  Medication Sig  . acetaminophen (TYLENOL) 500 MG tablet Take 500 mg by mouth 3 (three) times daily.  Marland Kitchen amoxicillin (AMOXIL) 500 MG tablet Take 500 mg by mouth as needed. Take 1 hour prior to dental  appointment.  . Calcium Carbonate (CALCIUM 600 PO) Take 600-900 mg by mouth. Take 1 tablet in the morning. Take one tablet 1/2 tablets in evening for calcium  . dextromethorphan-guaiFENesin (MUCINEX DM) 30-600 MG 12hr tablet Take 1 tablet by mouth 2 (two) times daily as needed for cough.  . diltiazem (CARDIZEM CD) 120 MG 24 hr capsule Take 1 capsule (120 mg total) by mouth daily.  . enalapril  (VASOTEC) 20 MG tablet Take 20 mg by mouth 2 (two) times daily.    . furosemide (LASIX) 40 MG tablet Take 40 mg by mouth daily.   Marland Kitchen loratadine (CLARITIN) 10 MG tablet Take 10 mg by mouth daily.  . Magnesium Oxide 250 MG TABS Take 250 mg by mouth daily.  . metoprolol succinate (TOPROL-XL) 100 MG 24 hr tablet Take 100 + 50 mg daily to = 150  for blood pressure.  . metoprolol succinate (TOPROL-XL) 50 MG 24 hr tablet Take 50 mg by mouth daily. Take 50 +100 mg daily to = 150 mg for blood pressure.  . OXYGEN Inhale 2 L into the lungs daily as needed (shortness of breath). Use 2 liter as needed for SOB, 02 sat < 90% on room air   . potassium chloride SA (K-DUR,KLOR-CON) 20 MEQ tablet Take 20 mEq by mouth daily.   . predniSONE (DELTASONE) 1 MG tablet Take 4 mg by mouth daily with breakfast.  . Rivaroxaban (XARELTO) 15 MG TABS tablet Take 15 mg by mouth daily with supper.  . vitamin C (ASCORBIC ACID) 500 MG tablet Take 500 mg by mouth daily.    . Wheat Dextrin (BENEFIBER PO) Take 4 scoop by mouth daily.    No facility-administered encounter medications on file as of 12/04/2018.     Review of Systems  Constitutional: Negative for activity change, appetite change, chills, diaphoresis, fatigue, fever and unexpected weight change.  HENT: Positive for hearing loss. Negative for congestion and voice change.   Respiratory: Negative for cough, shortness of breath and wheezing.   Cardiovascular: Positive for leg swelling. Negative for chest pain and palpitations.  Gastrointestinal: Negative for abdominal distention, abdominal pain, constipation, diarrhea, nausea and vomiting.  Genitourinary: Negative for difficulty urinating, dysuria and urgency.  Musculoskeletal: Positive for arthralgias, back pain, gait problem, myalgias and neck pain. Negative for joint swelling.       Right side.   Skin: Negative for color change and pallor.  Neurological: Negative for dizziness, speech difficulty, weakness and  headaches.       Memory lapses.   Hematological: Bruises/bleeds easily.  Psychiatric/Behavioral: Negative for agitation, behavioral problems, hallucinations and sleep disturbance. The patient is not nervous/anxious.     Immunization History  Administered Date(s) Administered  . Influenza Whole 09/14/2018  . Influenza-Unspecified 09/13/2015, 09/29/2016, 10/03/2017  . Pneumococcal Conjugate-13 10/03/2017  . Pneumococcal-Unspecified 05/14/2015  . Tdap 04/13/2011   Pertinent  Health Maintenance Due  Topic Date Due  . INFLUENZA VACCINE  Completed  . DEXA SCAN  Completed  . PNA vac Low Risk Adult  Completed   Fall Risk  09/01/2018 08/23/2017 08/12/2016  Falls in the past year? No No No   Functional Status Survey:    Vitals:   12/04/18 1219  BP: 140/70  Pulse: 68  Resp: 18  Temp: 98.3 F (36.8 C)  SpO2: 96%  Weight: 160 lb 3.2 oz (72.7 kg)  Height: 5' (1.524 m)   Body mass index is 31.29 kg/m. Physical Exam Constitutional:      Appearance: Normal appearance. She is  obese.  HENT:     Head: Normocephalic and atraumatic.     Nose: Nose normal. No congestion or rhinorrhea.     Mouth/Throat:     Mouth: Mucous membranes are moist.  Eyes:     Extraocular Movements: Extraocular movements intact.     Pupils: Pupils are equal, round, and reactive to light.  Neck:     Musculoskeletal: Normal range of motion and neck supple. No neck rigidity or muscular tenderness.     Vascular: No carotid bruit.  Cardiovascular:     Rate and Rhythm: Normal rate. Rhythm irregular.     Heart sounds: No murmur.  Pulmonary:     Effort: Pulmonary effort is normal.     Breath sounds: Normal breath sounds. No wheezing, rhonchi or rales.  Abdominal:     General: There is no distension.     Palpations: Abdomen is soft.     Tenderness: There is no abdominal tenderness. There is no guarding or rebound.  Musculoskeletal:     Right lower leg: Edema present.     Left lower leg: Edema present.      Comments: Trace edema BLE. W/c for mobility. Right side neck pain is chronic.   Skin:    General: Skin is warm and dry.     Findings: No rash.  Neurological:     General: No focal deficit present.     Mental Status: She is alert.     Cranial Nerves: No cranial nerve deficit.     Motor: No weakness.     Gait: Gait abnormal.     Comments: Oriented to person and place.  Psychiatric:        Mood and Affect: Mood normal.        Behavior: Behavior normal.     Labs reviewed: Recent Labs    12/27/17 06/01/18 08/22/18  NA 136* 139 138  K 4.0 4.1 4.0  CL 98 100 101  CO2 30 31 28   BUN 21 25* 25*  CREATININE 0.8 0.9 0.9  CALCIUM 10.0 9.4 9.5   Recent Labs    12/27/17 06/01/18  AST 20 18  ALT 14 22  ALKPHOS 42 49  PROT 6.0  --   ALBUMIN 4.2 4.1   Recent Labs    12/27/17 09/12/18  WBC 7.0 6.9  HGB 14.4 13.4  HCT 41 39  PLT 199 183   Lab Results  Component Value Date   TSH 1.82 03/07/2018   Lab Results  Component Value Date   HGBA1C 5.4 05/17/2017   Lab Results  Component Value Date   CHOL 185 03/07/2018   HDL 73 (A) 03/07/2018   LDLCALC 86 03/07/2018   TRIG 158 03/07/2018   CHOLHDL 2.4 05/04/2016    Significant Diagnostic Results in last 30 days:  No results found.  Assessment/Plan Essential hypertension Blood pressure is controlled, continue Metoprolol 150mg  qd, Diltiazem 120mg  qd, Enalapril 20mg  bid.   Atrial fibrillation (HCC) Heart rate is in control, continue Metoprolol 150mg  qd, Diltiazem 120mg  qd. Rivaroxaban 15mg  qd.   Chronic diastolic CHF (congestive heart failure), NYHA class 3 ([place]) Compensated clinically, continue Furosemide 40mg  qd.   Rheumatoid arthritis (HCC) Stable, continue Prednisone 4mg  qd, Tylenol 500mg  tid  Allergic rhinitis Stable, continue Loratadine 10mg  qd.   Cervicalgia 08/14/16 X-ray C spine: anterior interbody fusion cervical spine, severe degenerative disc disease C7-T1, has shoulder prosthesis.  Right side neck pain, s/p  inj 3 months ago, continue Tylenol 500mg  tid, adding Lidoderm patch on 12hr,  off 12hr     Family/ staff Communication: plan of care reviewed with the patient and charge nurse.   Labs/tests ordered:  none  Time spend 25 minutes.

## 2018-12-04 NOTE — Assessment & Plan Note (Addendum)
Stable, continue Prednisone 4mg  qd, Tylenol 500mg  tid

## 2018-12-04 NOTE — Assessment & Plan Note (Signed)
Stable, continue Loratadine 10mg qd.  

## 2018-12-04 NOTE — Assessment & Plan Note (Signed)
Heart rate is in control, continue Metoprolol 150mg  qd, Diltiazem 120mg  qd. Rivaroxaban 15mg  qd.

## 2018-12-12 ENCOUNTER — Encounter: Payer: Self-pay | Admitting: Nurse Practitioner

## 2018-12-12 ENCOUNTER — Non-Acute Institutional Stay: Payer: PPO | Admitting: Nurse Practitioner

## 2018-12-12 DIAGNOSIS — I5032 Chronic diastolic (congestive) heart failure: Secondary | ICD-10-CM | POA: Diagnosis not present

## 2018-12-12 DIAGNOSIS — J069 Acute upper respiratory infection, unspecified: Secondary | ICD-10-CM | POA: Insufficient documentation

## 2018-12-12 NOTE — Assessment & Plan Note (Signed)
Compensated clinically, continue Furosemide 40mg  qd. Observe.

## 2018-12-12 NOTE — Assessment & Plan Note (Signed)
Will obtain CXR ap view to r/o PNA. Will monitory VS Sat O2 q shift, DuoNeb tid, Mucinex 600mg  bid, Claritin 10mg  qd for 72 hours. Observe.

## 2018-12-12 NOTE — Progress Notes (Signed)
[facility] Room Number: 458 Place of Service: [facility] Provider:  Lennie Odor Dorrian Doggett NP  Blanchie Serve, MD (Inactive)  Patient Care Team: Blanchie Serve, MD (Inactive) as PCP - General (Internal Medicine) Mykael Trott X, NP as Nurse Practitioner (Internal Medicine) Martinique, Peter M, MD as Consulting Physician (Cardiology) Lavonna Monarch, MD as Consulting Physician (Dermatology) Garald Balding, MD as Consulting Physician (Orthopedic Surgery)  Extended Emergency Contact Information Primary Emergency Contact: Berneta Levins Address: [address] [place], [place] [address] Johnnette Litter of [place] Phone: [phone] Work Phone: [phone] Mobile Phone: [phone] Relation: Daughter Secondary Emergency Contact: Azalee Course States of [place] Phone: [phone] Mobile Phone: [phone] Relation: Son  Code Status:  DNR Goals of care: Advanced Directive information Advanced Directives 12/04/2018  Does Patient Have a Medical Advance Directive? Yes  Type of Advance Directive Out of facility DNR (pink MOST or yellow form);Living will  Does patient want to make changes to medical advance directive? No - Patient declined  Copy of [place] in Chart? -  Would patient like information on creating a medical advance directive? -  Pre-existing out of facility DNR order (yellow form or pink MOST form) Yellow form placed in chart (order not valid for inpatient use)     Chief Complaint  Patient presents with  . Acute Visit    wheezing cough x 1-2 days.    HPI:  Pt is a 84 y.o. female seen today for sudden onset wheezing cough for 1-2 days, the patient denied generalized malaise, sore throat, sinus pain,  chest pain, SOB, palpitation. She admitted some clear sputum production occasionally. She is afebrile. Hx of CHF, compensated clinically, trace edema BLE is chronic, on Furosemide 40mg  qd.    Past Medical History:    Diagnosis Date  . Cervicalgia 08/12/2016  . CHF (congestive heart failure) ([place])   . Chronic diastolic CHF (congestive heart failure), NYHA class 3 ([place]) 06/17/2016  . Complication of anesthesia   . Edema 08/12/2016  . Hypertension   . New onset atrial fibrillation ([place]) 04/2016   a. started on Xarelto and Cardizem  . PONV (postoperative nausea and vomiting)   . RA (rheumatoid arthritis) ([place])   . SOB (shortness of breath) 04/2016   Hospitalist  . Vertigo    Past Surgical History:  Procedure Laterality Date  . Biopsy right breast Right    fibrocystic changes  . CARDIOVERSION N/A 07/08/2016   Procedure: CARDIOVERSION;  Surgeon: Larey Dresser, MD;  Location: [facility];  Service: Cardiovascular;  Laterality: N/A;  . CERVICAL SPINE SURGERY  2005   Dr. Vertell Limber  . HYSTEROSCOPY  2001  . JOINT REPLACEMENT    . REPLACEMENT TOTAL KNEE Right 1996   [place] Bilateral [id number]   Baird Lyons MD    Allergies  Allergen Reactions  . Morphine Sulfate Other (See Comments)    "extremely nauseated"  . Other Nausea And Vomiting    Pt states she is very sensitive to pain medications.    Allergies as of 12/12/2018      Reactions   Morphine Sulfate Other (See Comments)   "extremely nauseated"   Other Nausea And Vomiting   Pt states she is very sensitive to pain medications.      Medication List       Accurate as of December 12, 2018  4:02 PM. Always use your most recent  med list.        acetaminophen 500 MG tablet Commonly known as:  TYLENOL Take 500 mg by mouth 3 (three) times daily.   amoxicillin 500 MG tablet Commonly known as:  AMOXIL Take 500 mg by mouth as needed. Take 1 hour prior to dental appointment.   BENEFIBER PO Take 4 scoop by mouth daily.   CALCIUM 600 PO Take 600-900 mg by mouth. Take 1 tablet in the morning. Take one tablet 1/2 tablets in evening for calcium   CLARITIN 10 MG tablet Generic drug:  loratadine Take 10  mg by mouth daily.   dextromethorphan-guaiFENesin 30-600 MG 12hr tablet Commonly known as:  MUCINEX DM Take 1 tablet by mouth 2 (two) times daily as needed for cough.   diltiazem 120 MG 24 hr capsule Commonly known as:  CARDIZEM CD Take 1 capsule (120 mg total) by mouth daily.   enalapril 20 MG tablet Commonly known as:  VASOTEC Take 20 mg by mouth 2 (two) times daily.   furosemide 40 MG tablet Commonly known as:  LASIX Take 40 mg by mouth daily.   Magnesium Oxide 250 MG Tabs Take 250 mg by mouth daily.   metoprolol succinate 50 MG 24 hr tablet Commonly known as:  TOPROL-XL Take 50 mg by mouth daily. Take 50 +100 mg daily to = 150 mg for blood pressure.   metoprolol succinate 100 MG 24 hr tablet Commonly known as:  TOPROL-XL Take 100 + 50 mg daily to = 150  for blood pressure.   OXYGEN Inhale 2 L into the lungs daily as needed (shortness of breath). Use 2 liter as needed for SOB, 02 sat < 90% on room air   potassium chloride SA 20 MEQ tablet Commonly known as:  K-DUR,KLOR-CON Take 20 mEq by mouth daily.   predniSONE 1 MG tablet Commonly known as:  DELTASONE Take 4 mg by mouth daily with breakfast.   Rivaroxaban 15 MG Tabs tablet Commonly known as:  XARELTO Take 15 mg by mouth daily with supper.   vitamin C 500 MG tablet Commonly known as:  ASCORBIC ACID Take 500 mg by mouth daily.       Review of Systems  Constitutional: Negative for activity change, appetite change, chills, diaphoresis, fatigue and fever.  HENT: Positive for congestion and hearing loss. Negative for postnasal drip, rhinorrhea, sinus pressure, sinus pain, sneezing, sore throat, trouble swallowing and voice change.   Respiratory: Positive for cough and wheezing. Negative for chest tightness and shortness of breath.   Cardiovascular: Positive for leg swelling. Negative for chest pain and palpitations.  Gastrointestinal: Negative for abdominal distention, abdominal pain, constipation, diarrhea,  nausea and vomiting.  Genitourinary: Negative for difficulty urinating, dysuria and urgency.  Musculoskeletal: Positive for arthralgias and gait problem.  Skin: Negative for color change and pallor.  Neurological: Negative for dizziness, speech difficulty, weakness and headaches.  Psychiatric/Behavioral: Negative for agitation, behavioral problems, hallucinations and sleep disturbance. The patient is not nervous/anxious.     Immunization History  Administered Date(s) Administered  . Influenza Whole 09/14/2018  . Influenza-Unspecified 09/13/2015, 09/29/2016, 10/03/2017  . Pneumococcal Conjugate-13 10/03/2017  . Pneumococcal-Unspecified 05/14/2015  . Tdap 04/13/2011   Pertinent  Health Maintenance Due  Topic Date Due  . INFLUENZA VACCINE  Completed  . DEXA SCAN  Completed  . PNA vac Low Risk Adult  Completed   Fall Risk  09/01/2018 08/23/2017 08/12/2016  Falls in the past year? No No No   Functional Status Survey:  Vitals:   12/12/18 1140  BP: 125/64  Pulse: 72  Resp: 20  Temp: 98.3 F (36.8 C)   There is no height or weight on file to calculate BMI. Physical Exam Constitutional:      General: She is not in acute distress.    Appearance: Normal appearance. She is not ill-appearing, toxic-appearing or diaphoretic.  HENT:     Nose: Nose normal. No congestion or rhinorrhea.     Mouth/Throat:     Mouth: Mucous membranes are moist.     Pharynx: No oropharyngeal exudate or posterior oropharyngeal erythema.  Eyes:     Extraocular Movements: Extraocular movements intact.     Pupils: Pupils are equal, round, and reactive to light.  Cardiovascular:     Rate and Rhythm: Normal rate.     Heart sounds: No murmur.     Comments: Irregular heart beats.  Pulmonary:     Effort: Pulmonary effort is normal.     Breath sounds: Wheezing, rhonchi and rales present.  Abdominal:     General: There is no distension.     Palpations: Abdomen is soft.     Tenderness: There is no abdominal  tenderness. There is no guarding or rebound.  Musculoskeletal:     Right lower leg: Edema present.     Left lower leg: Edema present.     Comments: Trace edema BLE. Right sided neck pain. W/c for mobility.   Skin:    General: Skin is warm and dry.     Coloration: Skin is not pale.     Findings: No erythema or rash.  Neurological:     General: No focal deficit present.     Cranial Nerves: No cranial nerve deficit.     Motor: No weakness.     Coordination: Coordination normal.     Gait: Gait abnormal.     Comments: Oriented to person and place.      Labs reviewed: Recent Labs    12/27/17 06/01/18 08/22/18  NA 136* 139 138  K 4.0 4.1 4.0  CL 98 100 101  CO2 30 31 28   BUN 21 25* 25*  CREATININE 0.8 0.9 0.9  CALCIUM 10.0 9.4 9.5   Recent Labs    12/27/17 06/01/18  AST 20 18  ALT 14 22  ALKPHOS 42 49  PROT 6.0  --   ALBUMIN 4.2 4.1   Recent Labs    12/27/17 09/12/18  WBC 7.0 6.9  HGB 14.4 13.4  HCT 41 39  PLT 199 183   Lab Results  Component Value Date   TSH 1.82 03/07/2018   Lab Results  Component Value Date   HGBA1C 5.4 05/17/2017   Lab Results  Component Value Date   CHOL 185 03/07/2018   HDL 73 (A) 03/07/2018   LDLCALC 86 03/07/2018   TRIG 158 03/07/2018   CHOLHDL 2.4 05/04/2016    Significant Diagnostic Results in last 30 days:  No results found.  Assessment/Plan  Upper respiratory infection Will obtain CXR ap view to r/o PNA. Will monitory VS Sat O2 q shift, DuoNeb tid, Mucinex 600mg  bid, Claritin 10mg  qd for 72 hours. Observe.   Chronic diastolic CHF (congestive heart failure), NYHA class 3 ([place]) Compensated clinically, continue Furosemide 40mg  qd. Observe.    Family/ staff Communication: plan of care reviewed with the patient and charge nurse.   Labs/tests ordered:  CXR  Time spend 25 minutes.

## 2018-12-13 DIAGNOSIS — R05 Cough: Secondary | ICD-10-CM | POA: Diagnosis not present

## 2018-12-19 ENCOUNTER — Encounter: Payer: Self-pay | Admitting: Internal Medicine

## 2018-12-19 ENCOUNTER — Non-Acute Institutional Stay: Payer: PPO | Admitting: Internal Medicine

## 2018-12-19 DIAGNOSIS — J069 Acute upper respiratory infection, unspecified: Secondary | ICD-10-CM

## 2018-12-19 DIAGNOSIS — I480 Paroxysmal atrial fibrillation: Secondary | ICD-10-CM

## 2018-12-19 DIAGNOSIS — I5032 Chronic diastolic (congestive) heart failure: Secondary | ICD-10-CM

## 2018-12-19 DIAGNOSIS — J309 Allergic rhinitis, unspecified: Secondary | ICD-10-CM | POA: Diagnosis not present

## 2018-12-19 NOTE — Progress Notes (Signed)
Location:  [facility] Room Number: 918-A Place of Service:  [facility] [phone]) Provider:    Virgie Dad, MD  Patient Care Team: Virgie Dad, MD as PCP - General (Internal Medicine) Mast, Man X, NP as Nurse Practitioner (Internal Medicine) Martinique, Peter M, MD as Consulting Physician (Cardiology) Lavonna Monarch, MD as Consulting Physician (Dermatology) Garald Balding, MD as Consulting Physician (Orthopedic Surgery)  Extended Emergency Contact Information Primary Emergency Contact: Berneta Levins Address: [address] [place], [place] [address] Johnnette Litter of [place] Phone: [phone] Work Phone: [phone] Mobile Phone: [phone] Relation: Daughter Secondary Emergency Contact: Azalee Course States of [place] Phone: [phone] Mobile Phone: [phone] Relation: Son  Code Status: DNR Goals of care: Advanced Directive information Advanced Directives 12/19/2018  Does Patient Have a Medical Advance Directive? Yes  Type of Advance Directive Living will;Out of facility DNR (pink MOST or yellow form)  Does patient want to make changes to medical advance directive? No - Patient declined  Copy of [place] in Chart? -  Would patient like information on creating a medical advance directive? -  Pre-existing out of facility DNR order (yellow form or pink MOST form) Yellow form placed in chart (order not valid for inpatient use)     Chief Complaint  Patient presents with  . Acute Visit    HPI:  Pt is a 85 y.o. female seen today for an acute visit for Cough. Patient has h/o Chronic Atrial Fibrillation on Xarelto, Hypertension, Diastolic CHF, Rheumatoid arthritis on Chronic Prednisone She was seen a week ago for dry cough and SOB with Low grade fever. Her chest xray was negative.But since her cough has persisted and it os productive now she was started on Doxycycline yesterday. Patient continues to have  cough, Wheezing and SOB. She also has Mild Right sided chest pain with cough No Fever .    Past Medical History:  Diagnosis Date  . Cervicalgia 08/12/2016  . CHF (congestive heart failure) ([place])   . Chronic diastolic CHF (congestive heart failure), NYHA class 3 ([place]) 06/17/2016  . Complication of anesthesia   . Edema 08/12/2016  . Hypertension   . New onset atrial fibrillation ([place]) 04/2016   a. started on Xarelto and Cardizem  . PONV (postoperative nausea and vomiting)   . RA (rheumatoid arthritis) ([place])   . SOB (shortness of breath) 04/2016   Hospitalist  . Vertigo    Past Surgical History:  Procedure Laterality Date  . Biopsy right breast Right    fibrocystic changes  . CARDIOVERSION N/A 07/08/2016   Procedure: CARDIOVERSION;  Surgeon: Larey Dresser, MD;  Location: [facility];  Service: Cardiovascular;  Laterality: N/A;  . CERVICAL SPINE SURGERY  2005   Dr. Vertell Limber  . HYSTEROSCOPY  2001  . JOINT REPLACEMENT    . REPLACEMENT TOTAL KNEE Right 1996   [place] Bilateral [id number]   Baird Lyons MD    Allergies  Allergen Reactions  . Morphine Sulfate Other (See Comments)    "extremely nauseated"  . Other Nausea And Vomiting    Pt states she is very sensitive to pain medications.    Outpatient Encounter Medications as of 12/19/2018  Medication Sig  . acetaminophen (TYLENOL) 500 MG tablet Take 500 mg by mouth 3 (three) times daily.  Marland Kitchen amoxicillin (AMOXIL) 500 MG tablet Take 500 mg by mouth as needed. Take 1 hour prior  to dental appointment.  . Calcium Carbonate (CALCIUM 600 PO) Take 600-900 mg by mouth. Take 1 tablet in the morning. Take one tablet 1/2 tablets in evening for calcium  . diltiazem (CARDIZEM CD) 120 MG 24 hr capsule Take 1 capsule (120 mg total) by mouth daily.  Marland Kitchen doxycycline (VIBRAMYCIN) 100 MG capsule Take 100 mg by mouth 2 (two) times daily.  . enalapril (VASOTEC) 20 MG tablet Take 20 mg by mouth 2 (two) times daily.      . furosemide (LASIX) 40 MG tablet Take 40 mg by mouth daily.   Marland Kitchen ipratropium-albuterol (DUONEB) 0.5-2.5 (3) MG/3ML SOLN Take 3 mLs by nebulization 3 (three) times daily.  Marland Kitchen loratadine (CLARITIN) 10 MG tablet Take 10 mg by mouth daily.  . Magnesium Oxide 250 MG TABS Take 250 mg by mouth daily.  . metoprolol succinate (TOPROL-XL) 100 MG 24 hr tablet Take 100 + 50 mg daily to = 150  for blood pressure.  . OXYGEN Inhale 2 L into the lungs daily as needed (shortness of breath). Use 2 liter as needed for SOB, 02 sat < 90% on room air   . potassium chloride SA (K-DUR,KLOR-CON) 20 MEQ tablet Take 20 mEq by mouth daily.   . predniSONE (DELTASONE) 1 MG tablet Take 4 mg by mouth daily with breakfast.  . Rivaroxaban (XARELTO) 15 MG TABS tablet Take 15 mg by mouth daily with supper.  . saccharomyces boulardii (FLORASTOR) 250 MG capsule Take 250 mg by mouth 2 (two) times daily.  . vitamin C (ASCORBIC ACID) 500 MG tablet Take 500 mg by mouth daily.    . Wheat Dextrin (BENEFIBER PO) Take 4 scoop by mouth daily.   Marland Kitchen dextromethorphan-guaiFENesin (MUCINEX DM) 30-600 MG 12hr tablet Take 1 tablet by mouth 2 (two) times daily as needed for cough.  . [DISCONTINUED] metoprolol succinate (TOPROL-XL) 50 MG 24 hr tablet Take 50 mg by mouth daily. Take 50 +100 mg daily to = 150 mg for blood pressure.   No facility-administered encounter medications on file as of 12/19/2018.     Review of Systems  Constitutional: Positive for activity change and appetite change.  Respiratory: Positive for cough, shortness of breath and wheezing.   Cardiovascular: Positive for leg swelling.  Gastrointestinal: Negative.   Genitourinary: Negative.   Musculoskeletal: Negative.   Skin: Negative.   Neurological: Negative.   Psychiatric/Behavioral: Negative.     Immunization History  Administered Date(s) Administered  . Influenza Whole 09/14/2018  . Influenza-Unspecified 09/13/2015, 09/29/2016, 10/03/2017  . Pneumococcal Conjugate-13  10/03/2017  . Pneumococcal-Unspecified 05/14/2015  . Tdap 04/13/2011   Pertinent  Health Maintenance Due  Topic Date Due  . INFLUENZA VACCINE  Completed  . DEXA SCAN  Completed  . PNA vac Low Risk Adult  Completed   Fall Risk  09/01/2018 08/23/2017 08/12/2016  Falls in the past year? No No No   Functional Status Survey:    Vitals:   12/19/18 1144  BP: 138/80  Pulse: 88  Resp: 18  Temp: 98.2 F (36.8 C)  SpO2: 92%  Weight: 161 lb 9.6 oz (73.3 kg)   Body mass index is 31.56 kg/m. Physical Exam Vitals signs and nursing note reviewed.  Constitutional:      Appearance: Normal appearance.  HENT:     Head: Normocephalic.     Nose: Nose normal.     Mouth/Throat:     Mouth: Mucous membranes are moist.     Pharynx: Oropharynx is clear.  Eyes:     Pupils:  Pupils are equal, round, and reactive to light.  Neck:     Musculoskeletal: Neck supple.  Cardiovascular:     Rate and Rhythm: Normal rate. Rhythm irregular.     Pulses: Normal pulses.     Heart sounds: No murmur.  Pulmonary:     Effort: Pulmonary effort is normal.     Comments: Patient had Bilateral Expiratory Wheezing. Abdominal:     General: Abdomen is flat. There is no distension.     Palpations: Abdomen is soft.     Tenderness: There is no abdominal tenderness.  Musculoskeletal:     Comments: Has moderate swelling bilateral which is her baseline  Skin:    General: Skin is warm and dry.  Neurological:     General: No focal deficit present.     Mental Status: She is alert and oriented to person, place, and time.  Psychiatric:        Mood and Affect: Mood normal.        Thought Content: Thought content normal.     Labs reviewed: Recent Labs    12/27/17 06/01/18 08/22/18  NA 136* 139 138  K 4.0 4.1 4.0  CL 98 100 101  CO2 30 31 28   BUN 21 25* 25*  CREATININE 0.8 0.9 0.9  CALCIUM 10.0 9.4 9.5   Recent Labs    12/27/17 06/01/18  AST 20 18  ALT 14 22  ALKPHOS 42 49  PROT 6.0  --   ALBUMIN 4.2 4.1    Recent Labs    12/27/17 09/12/18  WBC 7.0 6.9  HGB 14.4 13.4  HCT 41 39  PLT 199 183   Lab Results  Component Value Date   TSH 1.82 03/07/2018   Lab Results  Component Value Date   HGBA1C 5.4 05/17/2017   Lab Results  Component Value Date   CHOL 185 03/07/2018   HDL 73 (A) 03/07/2018   LDLCALC 86 03/07/2018   TRIG 158 03/07/2018   CHOLHDL 2.4 05/04/2016    Significant Diagnostic Results in last 30 days:  No results found.  Assessment/Plan  Upper respiratory Bronchitis with Wheezing Will increase her Prednisone to 30 mg for few days and taper down to 4mg . Continue on Doxycyline Patient also on Albuterol Nebs PRN Claritin QD and Mucinex CHF Her weight is stable Continue on Lasix 40 mg QD Atrial fibrillation  Rate control on Cardizem and Metoprolol Also on Xarelto    Family/ staff Communication:   Labs/tests ordered:

## 2019-02-05 ENCOUNTER — Ambulatory Visit (INDEPENDENT_AMBULATORY_CARE_PROVIDER_SITE_OTHER): Payer: PPO | Admitting: Orthopaedic Surgery

## 2019-02-05 ENCOUNTER — Encounter (INDEPENDENT_AMBULATORY_CARE_PROVIDER_SITE_OTHER): Payer: Self-pay | Admitting: Orthopaedic Surgery

## 2019-02-05 VITALS — BP 159/76 | HR 86 | Ht 62.0 in | Wt 160.0 lb

## 2019-02-05 DIAGNOSIS — M542 Cervicalgia: Secondary | ICD-10-CM | POA: Diagnosis not present

## 2019-02-05 MED ORDER — METHYLPREDNISOLONE ACETATE 40 MG/ML IJ SUSP
40.0000 mg | INTRAMUSCULAR | Status: AC | PRN
  Administered 2019-02-05: 40 mg via INTRAMUSCULAR

## 2019-02-05 MED ORDER — LIDOCAINE HCL 1 % IJ SOLN
2.0000 mL | INTRAMUSCULAR | Status: AC | PRN
  Administered 2019-02-05: 2 mL

## 2019-02-05 NOTE — Addendum Note (Signed)
Addended by: Lendon Collar on: 02/05/2019 04:28 PM   Modules accepted: Orders

## 2019-02-05 NOTE — Progress Notes (Signed)
Office Visit Note   Patient: [name]           Date of Birth: 01-Jan-1928           MRN: [id number] Visit Date: 02/05/2019              Requested by: Virgie Dad, MD [place], [place] [id number] PCP: Virgie Dad, MD   Assessment & Plan: Visit Diagnoses:  1. Cervicalgia     Plan: Recurrent areas of trigger point tenderness cervical spine posteriorly.  Prior areas of trigger point tenderness have helped.  Will reinject those and order an MRI scan.  Has had prior films 2 years ago demonstrating a prior anterior fusion between C3 and C5 with areas of arthritis above and below.  I believe this was causing her pain we will also order physical therapy at [facility] where [facility] resides.  May be her primary care physician at [facility] would consider increasing her prednisone dose  Follow-Up Instructions: Return MRI c-spine.   Orders:  Orders Placed This Encounter  Procedures  . Trigger Point Inj   No orders of the defined types were placed in this encounter.     Procedures: Trigger Point Inj Date/Time: 02/05/2019 3:13 PM Performed by: Garald Balding, MD Authorized by: Garald Balding, MD   Consent Given by:  Patient Indications:  Pain Total # of Trigger Points:  2 Location: neck   Needle Size:  27 G Approach:  Dorsal Medications #1:  2 mL lidocaine 1 % Medications #2:  40 mg methylPREDNISolone acetate 40 MG/ML     Clinical Data: No additional findings.   Subjective: Chief Complaint  Patient presents with  . Neck - Pain  Patient presents today for neck pain. She was last seen 3 months ago and received trigger point injection on 11/07/18. She said that she did not get much relief from the previous injection. She is taking tylenol as needed. Patient states that she hurts on the right and left side of her neck, but worse on the right side. She said that she has difficulty turning her head. No numbness or tingling in her hands or  fingers except some chronic decreased sensibility in the thumb index and long finger of her right hand possibly related to either carpal tunnel or prior shoulder surgery.  Pain is predominantly localized to the cervical spine particularly with certain motions.  Has had prior arthroplasty in both shoulders for arthritis  HPI  Review of Systems  Constitutional: Negative for fatigue.  HENT: Negative for ear pain.   Eyes: Negative for pain.  Respiratory: Negative for shortness of breath.   Cardiovascular: Positive for leg swelling.  Gastrointestinal: Negative for constipation and diarrhea.  Endocrine: Negative for cold intolerance and heat intolerance.  Genitourinary: Negative for difficulty urinating.  Musculoskeletal: Negative for joint swelling.  Skin: Negative for rash.  Allergic/Immunologic: Negative for food allergies.  Neurological: Negative for weakness.  Hematological: Does not bruise/bleed easily.  Psychiatric/Behavioral: Negative for sleep disturbance.     Objective: Vital Signs: BP (!) 159/76   Pulse 86   Ht 5\' 2"  (1.575 m)   Wt 160 lb (72.6 kg)   BMI 29.26 kg/m   Physical Exam Constitutional:      Appearance: She is well-developed.  Eyes:     Pupils: Pupils are equal, round, and reactive to light.  Pulmonary:     Effort: Pulmonary effort is normal.  Skin:    General: Skin  is warm and dry.  Neurological:     Mental Status: She is alert and oriented to person, place, and time.  Psychiatric:        Behavior: Behavior normal.     Ortho Exam awake alert and oriented x3.  Accompanied by her daughter and is seen  in a wheelchair.  Able to touch her chin to her chest but with some discomfort.  Limited neck extension with some pain along the posterior cervical chain.  More pain in the posterior neck motion to the right than it was to the left.  There was no referred pain to either upper extremity  Specialty Comments:  No specialty comments available.  Imaging: No  results found.   PMFS History: Patient Active Problem List   Diagnosis Date Noted  . Upper respiratory infection 12/12/2018  . Allergic rhinitis 12/04/2018  . Unsteady gait 03/02/2018  . Left shoulder pain 12/26/2017  . Constipation 03/03/2017  . Depression with anxiety 01/26/2017  . Hyperlipidemia 12/27/2016  . Edema leg 09/22/2016  . Keratoacanthoma 08/12/2016  . Osteoarthritis 08/12/2016  . Cervicalgia 08/12/2016  . Chronic anticoagulation 07/29/2016  . Chronic diastolic CHF (congestive heart failure), NYHA class 3 ([facility]) 06/17/2016  . Atrial fibrillation ([facility]) 05/03/2016  . Abnormal chest CT 05/03/2016  . Syncope 11/03/2014  . Essential hypertension 11/03/2014  . Rheumatoid arthritis ([facility]) 11/03/2014   Past Medical History:  Diagnosis Date  . Cervicalgia 08/12/2016  . CHF (congestive heart failure) ([facility])   . Chronic diastolic CHF (congestive heart failure), NYHA class 3 ([facility]) 06/17/2016  . Complication of anesthesia   . Edema 08/12/2016  . Hypertension   . New onset atrial fibrillation ([facility]) 04/2016   a. started on Xarelto and Cardizem  . PONV (postoperative nausea and vomiting)   . RA (rheumatoid arthritis) ([facility])   . SOB (shortness of breath) 04/2016   Hospitalist  . Vertigo     Family History  Problem Relation Age of Onset  . Cancer Sister        brain  . Arthritis Daughter   . Hypothyroidism Daughter   . Hypertension Son   . Arthritis Son     Past Surgical History:  Procedure Laterality Date  . Biopsy right breast Right    fibrocystic changes  . CARDIOVERSION N/A 07/08/2016   Procedure: CARDIOVERSION;  Surgeon: Larey Dresser, MD;  Location: [facility];  Service: Cardiovascular;  Laterality: N/A;  . CERVICAL SPINE SURGERY  2005   Dr. Vertell Limber  . HYSTEROSCOPY  2001  . JOINT REPLACEMENT    . REPLACEMENT TOTAL KNEE Right 1996   [place] Bilateral [id number]   Baird Lyons MD   Social History   Occupational History  .  Occupation: Retired  Tobacco Use  . Smoking status: Former Smoker    Last attempt to quit: 12/13/1966    Years since quitting: 52.1  . Smokeless tobacco: Never Used  Substance and Sexual Activity  . Alcohol use: No  . Drug use: No  . Sexual activity: Never

## 2019-02-08 ENCOUNTER — Telehealth (INDEPENDENT_AMBULATORY_CARE_PROVIDER_SITE_OTHER): Payer: Self-pay | Admitting: Orthopaedic Surgery

## 2019-02-08 DIAGNOSIS — M25512 Pain in left shoulder: Secondary | ICD-10-CM | POA: Diagnosis not present

## 2019-02-08 DIAGNOSIS — M6281 Muscle weakness (generalized): Secondary | ICD-10-CM | POA: Diagnosis not present

## 2019-02-08 DIAGNOSIS — M25511 Pain in right shoulder: Secondary | ICD-10-CM | POA: Diagnosis not present

## 2019-02-08 DIAGNOSIS — M069 Rheumatoid arthritis, unspecified: Secondary | ICD-10-CM | POA: Diagnosis not present

## 2019-02-08 DIAGNOSIS — M542 Cervicalgia: Secondary | ICD-10-CM | POA: Diagnosis not present

## 2019-02-08 NOTE — Telephone Encounter (Signed)
PT referral received. Eliezer Lofts would like to get a little more history on patients cervical surgery if possible before PT. Would especially like to know if patient has metal in neck. Please call to advise.

## 2019-02-09 NOTE — Telephone Encounter (Signed)
Called Jenna back. No answer. Left message that I was very limited on any information about her cervical surgery since Dr.Whitfield did not perform that surgery. Ask her to call back if we could assist her anymore.

## 2019-02-14 DIAGNOSIS — M5412 Radiculopathy, cervical region: Secondary | ICD-10-CM | POA: Diagnosis not present

## 2019-02-14 DIAGNOSIS — M25512 Pain in left shoulder: Secondary | ICD-10-CM | POA: Diagnosis not present

## 2019-02-14 DIAGNOSIS — M542 Cervicalgia: Secondary | ICD-10-CM | POA: Diagnosis not present

## 2019-02-14 DIAGNOSIS — M069 Rheumatoid arthritis, unspecified: Secondary | ICD-10-CM | POA: Diagnosis not present

## 2019-02-14 DIAGNOSIS — M6281 Muscle weakness (generalized): Secondary | ICD-10-CM | POA: Diagnosis not present

## 2019-02-14 DIAGNOSIS — M25511 Pain in right shoulder: Secondary | ICD-10-CM | POA: Diagnosis not present

## 2019-03-05 ENCOUNTER — Non-Acute Institutional Stay: Payer: PPO | Admitting: Nurse Practitioner

## 2019-03-05 ENCOUNTER — Encounter: Payer: Self-pay | Admitting: Nurse Practitioner

## 2019-03-05 DIAGNOSIS — Z7901 Long term (current) use of anticoagulants: Secondary | ICD-10-CM

## 2019-03-05 DIAGNOSIS — J309 Allergic rhinitis, unspecified: Secondary | ICD-10-CM | POA: Diagnosis not present

## 2019-03-05 DIAGNOSIS — I10 Essential (primary) hypertension: Secondary | ICD-10-CM

## 2019-03-05 DIAGNOSIS — I5032 Chronic diastolic (congestive) heart failure: Secondary | ICD-10-CM

## 2019-03-05 DIAGNOSIS — I480 Paroxysmal atrial fibrillation: Secondary | ICD-10-CM

## 2019-03-05 DIAGNOSIS — M069 Rheumatoid arthritis, unspecified: Secondary | ICD-10-CM

## 2019-03-05 NOTE — Assessment & Plan Note (Addendum)
Blood pressure is controlled, continue Enalapril 20mg  bid, Cardizem 120mg  qd, Metoprolol 100mg  qd.

## 2019-03-05 NOTE — Assessment & Plan Note (Addendum)
Compensated clinically, continue Furosemide 13m qd. Update CMP/eGFR

## 2019-03-05 NOTE — Progress Notes (Signed)
Location:  [facility] Room Number: 160 Place of Service:  ALF [phone]) Provider:  Amiracle Neises, Lennie Odor  NP  Virgie Dad, MD  Patient Care Team: Virgie Dad, MD as PCP - General (Internal Medicine) Yechezkel Fertig X, NP as Nurse Practitioner (Internal Medicine) Martinique, Peter M, MD as Consulting Physician (Cardiology) Lavonna Monarch, MD as Consulting Physician (Dermatology) Garald Balding, MD as Consulting Physician (Orthopedic Surgery)  Extended Emergency Contact Information Primary Emergency Contact: Berneta Levins Address: [address] [place], [place] [address] Johnnette Litter of [place] Phone: [phone] Work Phone: [phone] Mobile Phone: [phone] Relation: Daughter Secondary Emergency Contact: Azalee Course States of [place] Phone: [phone] Mobile Phone: [phone] Relation: Son  Code Status:  DNR Goals of care: Advanced Directive information Advanced Directives 03/05/2019  Does Patient Have a Medical Advance Directive? Yes  Type of Advance Directive Living will;Healthcare Power of [place];Out of facility DNR (pink MOST or yellow form)  Does patient want to make changes to medical advance directive? No - Patient declined  Copy of [place] in Chart? Yes - validated most recent copy scanned in chart (See row information)  Would patient like information on creating a medical advance directive? -  Pre-existing out of facility DNR order (yellow form or pink MOST form) Yellow form placed in chart (order not valid for inpatient use)     Chief Complaint  Patient presents with  . Medical Management of Chronic Issues    HPI:  Pt is a 85 y.o. female seen today for medical management of chronic diseases.    The patient has history of Afib, heart rate is in control, takes Xarelto 58m qd. RA stable on Prednisone 422mqd, Tylenol 50056mid. HTN, blood pressure is controlled on, Cardizem 120m66m,  Enalapril 20mg83m,  Metoprolol 100mg 68mAllergic rhinitis, stable on Loratadine 10mg q82mHF/chronic edema BLE, stable on Furosemide 40mg qd74mPast Medical History:  Diagnosis Date  . Cervicalgia 08/12/2016  . CHF (congestive heart failure) (HCC)   .[place] diastolic CHF (congestive heart failure), NYHA class 3 (HCC) 7/6[place]17  . Complication of anesthesia   . Edema 08/12/2016  . Hypertension   . New onset atrial fibrillation (HCC) 05/[place]7   a. started on Xarelto and Cardizem  . PONV (postoperative nausea and vomiting)   . RA (rheumatoid arthritis) (HCC)   .[place] (shortness of breath) 04/2016   Hospitalist  . Vertigo    Past Surgical History:  Procedure Laterality Date  . Biopsy right breast Right    fibrocystic changes  . CARDIOVERSION N/A 07/08/2016   Procedure: CARDIOVERSION;  Surgeon: Dalton SLarey Dresserocation: [facility]OS[facility]: Cardiovascular;  Laterality: N/A;  . CERVICAL SPINE SURGERY  2005   Dr. Stern  .Vertell LimberEROSCOPY  2001  . JOINT REPLACEMENT    . REPLACEMENT TOTAL KNEE Right 1996   [place] 2005,[id number],7169ielBaird Lyonsllergies  Allergen Reactions  . Morphine Sulfate Other (See Comments)    "extremely nauseated"  . Other Nausea And Vomiting    Pt states she is very sensitive to pain medications.    Outpatient Encounter Medications as of 03/05/2019  Medication Sig  . acetaminophen (TYLENOL) 500 MG tablet Take 500 mg by mouth 3 (three) times daily.  . amoxicMarland Kitchenllin (AMOXIL) 500 MG tablet Take 500 mg by mouth as  needed. Take 1 hour prior to dental appointment.  . Calcium Carbonate (CALCIUM 600 PO) Take 600-900 mg by mouth. Take 1 tablet in the morning. Take one tablet 1/2 tablets in evening for calcium  . dextromethorphan-guaiFENesin (MUCINEX DM) 30-600 MG 12hr tablet Take 1 tablet by mouth 2 (two) times daily as needed for cough.  . diltiazem (CARDIZEM CD) 120 MG 24 hr capsule Take 1 capsule (120 mg total)  by mouth daily.  . enalapril (VASOTEC) 20 MG tablet Take 20 mg by mouth 2 (two) times daily.    . furosemide (LASIX) 40 MG tablet Take 40 mg by mouth daily.   . Lidocaine (PAIN RELIEF MAXIMUM STRENGTH) 4 % PTCH Apply 1 patch topically every 12 (twelve) hours.  Marland Kitchen loratadine (CLARITIN) 10 MG tablet Take 10 mg by mouth daily.  . Magnesium Oxide 250 MG TABS Take 250 mg by mouth daily.  . Menthol (BIOFREEZE) 10 % AERO Apply topically 3 (three) times daily as needed. Apply cream to right side of neck.  . metoprolol succinate (TOPROL-XL) 100 MG 24 hr tablet Take 100 + 50 mg daily to = 150  for blood pressure.  . OXYGEN Inhale 2 L into the lungs daily as needed (shortness of breath). Use 2 liter as needed for SOB, 02 sat < 90% on room air   . potassium chloride SA (K-DUR,KLOR-CON) 20 MEQ tablet Take 20 mEq by mouth daily.   . predniSONE (DELTASONE) 1 MG tablet Take 4 mg by mouth daily with breakfast.  . Rivaroxaban (XARELTO) 15 MG TABS tablet Take 15 mg by mouth daily with supper.  . vitamin C (ASCORBIC ACID) 500 MG tablet Take 500 mg by mouth daily.    . Wheat Dextrin (BENEFIBER PO) Take 4 scoop by mouth daily.   Marland Kitchen ipratropium-albuterol (DUONEB) 0.5-2.5 (3) MG/3ML SOLN Take 3 mLs by nebulization 3 (three) times daily.  . [DISCONTINUED] potassium chloride (KLOR-CON) 8 MEQ tablet Take 8 mEq by mouth daily.   No facility-administered encounter medications on file as of 03/05/2019.    ROS was provided with assistance of staff Review of Systems  Constitutional: Negative for activity change, appetite change, chills, diaphoresis, fatigue, fever and unexpected weight change.  HENT: Positive for hearing loss. Negative for congestion and voice change.   Respiratory: Negative for cough, shortness of breath and wheezing.   Cardiovascular: Positive for leg swelling. Negative for chest pain and palpitations.  Gastrointestinal: Negative for abdominal distention, abdominal pain, constipation, diarrhea, nausea and  vomiting.  Genitourinary: Negative for difficulty urinating, dysuria and urgency.  Musculoskeletal: Positive for arthralgias and gait problem.  Skin: Positive for color change. Negative for pallor.  Neurological: Negative for dizziness, speech difficulty, weakness and headaches.       Memory lapses.   Hematological: Bruises/bleeds easily.  Psychiatric/Behavioral: Negative for agitation, behavioral problems, hallucinations and sleep disturbance. The patient is not nervous/anxious.     Immunization History  Administered Date(s) Administered  . Influenza Whole 09/14/2018  . Influenza-Unspecified 09/13/2015, 09/29/2016, 10/03/2017  . Pneumococcal Conjugate-13 10/03/2017  . Pneumococcal-Unspecified 05/14/2015  . Tdap 04/13/2011   Pertinent  Health Maintenance Due  Topic Date Due  . INFLUENZA VACCINE  Completed  . DEXA SCAN  Completed  . PNA vac Low Risk Adult  Completed   Fall Risk  09/01/2018 08/23/2017 08/12/2016  Falls in the past year? No No No   Functional Status Survey:    Vitals:   03/05/19 0831  BP: 120/60  Pulse: 80  Resp: 18  Temp: 98.1  F (36.7 C)  SpO2: 94%  Weight: 158 lb (71.7 kg)  Height: 5' (1.524 m)   Body mass index is 30.86 kg/m. Physical Exam Constitutional:      General: She is not in acute distress.    Appearance: Normal appearance. She is obese. She is not ill-appearing, toxic-appearing or diaphoretic.  HENT:     Head: Normocephalic and atraumatic.     Nose: Nose normal.     Mouth/Throat:     Mouth: Mucous membranes are moist.  Eyes:     Extraocular Movements: Extraocular movements intact.     Pupils: Pupils are equal, round, and reactive to light.  Neck:     Musculoskeletal: Normal range of motion and neck supple.  Cardiovascular:     Rate and Rhythm: Normal rate. Rhythm irregular.     Heart sounds: No murmur.  Pulmonary:     Effort: Pulmonary effort is normal.     Breath sounds: Rales present. No wheezing or rhonchi.     Comments:  Posterior bibasilar rales.  Abdominal:     General: There is no distension.     Palpations: Abdomen is soft.     Tenderness: There is no abdominal tenderness. There is no guarding or rebound.  Musculoskeletal:     Right lower leg: Edema present.     Left lower leg: Edema present.     Comments: Trace edema BLE  Skin:    Findings: Bruising present.     Comments: Left forearm, small skin tear left lowe leg.   Neurological:     General: No focal deficit present.     Mental Status: She is alert. Mental status is at baseline.     Cranial Nerves: No cranial nerve deficit.     Motor: No weakness.     Coordination: Coordination normal.     Gait: Gait abnormal.     Comments: Oriented to person and place. W/c for mobility.   Psychiatric:        Mood and Affect: Mood normal.        Behavior: Behavior normal.     Labs reviewed: Recent Labs    06/01/18 08/22/18  NA 139 138  K 4.1 4.0  CL 100 101  CO2 31 28  BUN 25* 25*  CREATININE 0.9 0.9  CALCIUM 9.4 9.5   Recent Labs    06/01/18  AST 18  ALT 22  ALKPHOS 49  ALBUMIN 4.1   Recent Labs    09/12/18  WBC 6.9  HGB 13.4  HCT 39  PLT 183   Lab Results  Component Value Date   TSH 1.82 03/07/2018   Lab Results  Component Value Date   HGBA1C 5.4 05/17/2017   Lab Results  Component Value Date   CHOL 185 03/07/2018   HDL 73 (A) 03/07/2018   LDLCALC 86 03/07/2018   TRIG 158 03/07/2018   CHOLHDL 2.4 05/04/2016    Significant Diagnostic Results in last 30 days:  No results found.  Assessment/Plan Atrial fibrillation (HCC) Heart rate is in control, continue Xarelto 45m qd, Cardizem 1238mqd, Metoprolol 10045md.   Chronic diastolic CHF (congestive heart failure), NYHA class 3 (HCCOakwoodompensated clinically, continue Furosemide 76m67m. Update CMP/eGFR  Essential hypertension Blood pressure is controlled, continue Enalapril 20mg90m, Cardizem 120mg 4mMetoprolol 100mg q65m Allergic rhinitis Stable, continue  Loratadine 10mg qd2mRheumatoid arthritis (HCC) Stable, chronic aches/pains are well managed, continue Prednisone 4mg qd, 68menol 500mg tid.37mate CBC/diff  Chronic anticoagulation For Afib, continue Xarelto 48m qd.      Family/ staff Communication: plan of care reviewed with the patient and charge nurse.   Labs/tests ordered:  CBC/diff, CMP/eGFR  Time spend 25 minutes.

## 2019-03-05 NOTE — Assessment & Plan Note (Signed)
Stable, continue Loratadine 10mg qd.  

## 2019-03-05 NOTE — Assessment & Plan Note (Signed)
For Afib, continue Xarelto 15mg  qd.

## 2019-03-05 NOTE — Assessment & Plan Note (Addendum)
Heart rate is in control, continue Xarelto 15mg  qd, Cardizem 120mg  qd, Metoprolol 100mg  qd.

## 2019-03-05 NOTE — Assessment & Plan Note (Addendum)
Stable, chronic aches/pains are well managed, continue Prednisone 4mg  qd, Tylenol 500mg  tid. Update CBC/diff

## 2019-03-06 ENCOUNTER — Encounter: Payer: Self-pay | Admitting: Nurse Practitioner

## 2019-03-06 DIAGNOSIS — I5032 Chronic diastolic (congestive) heart failure: Secondary | ICD-10-CM | POA: Diagnosis not present

## 2019-03-06 LAB — CBC AND DIFFERENTIAL
HCT: 40 (ref 36–46)
Hemoglobin: 13.8 (ref 12.0–16.0)
Platelets: 164 (ref 150–399)

## 2019-03-06 LAB — HEPATIC FUNCTION PANEL
ALT: 14 (ref 7–35)
AST: 21 (ref 13–35)

## 2019-03-06 LAB — BASIC METABOLIC PANEL
BUN: 23 — AB (ref 4–21)
Creatinine: 0.8 (ref 0.5–1.1)
Glucose: 94

## 2019-03-12 ENCOUNTER — Encounter: Payer: Self-pay | Admitting: Nurse Practitioner

## 2019-03-12 NOTE — Progress Notes (Signed)
Opened in error

## 2019-03-14 DIAGNOSIS — M069 Rheumatoid arthritis, unspecified: Secondary | ICD-10-CM | POA: Diagnosis not present

## 2019-03-14 DIAGNOSIS — M25512 Pain in left shoulder: Secondary | ICD-10-CM | POA: Diagnosis not present

## 2019-03-14 DIAGNOSIS — M25511 Pain in right shoulder: Secondary | ICD-10-CM | POA: Diagnosis not present

## 2019-03-14 DIAGNOSIS — M6281 Muscle weakness (generalized): Secondary | ICD-10-CM | POA: Diagnosis not present

## 2019-03-14 DIAGNOSIS — M5412 Radiculopathy, cervical region: Secondary | ICD-10-CM | POA: Diagnosis not present

## 2019-03-14 DIAGNOSIS — M542 Cervicalgia: Secondary | ICD-10-CM | POA: Diagnosis not present

## 2019-04-02 ENCOUNTER — Encounter: Payer: Self-pay | Admitting: Nurse Practitioner

## 2019-04-02 ENCOUNTER — Non-Acute Institutional Stay: Payer: PPO | Admitting: Nurse Practitioner

## 2019-04-02 DIAGNOSIS — M549 Dorsalgia, unspecified: Secondary | ICD-10-CM | POA: Diagnosis not present

## 2019-04-02 DIAGNOSIS — M069 Rheumatoid arthritis, unspecified: Secondary | ICD-10-CM | POA: Diagnosis not present

## 2019-04-02 DIAGNOSIS — I5032 Chronic diastolic (congestive) heart failure: Secondary | ICD-10-CM | POA: Diagnosis not present

## 2019-04-02 DIAGNOSIS — R32 Unspecified urinary incontinence: Secondary | ICD-10-CM | POA: Diagnosis not present

## 2019-04-02 DIAGNOSIS — N390 Urinary tract infection, site not specified: Secondary | ICD-10-CM | POA: Diagnosis not present

## 2019-04-02 DIAGNOSIS — N183 Chronic kidney disease, stage 3 (moderate): Secondary | ICD-10-CM | POA: Diagnosis not present

## 2019-04-02 NOTE — Assessment & Plan Note (Signed)
Left CVA region, not positional, not associated with deep breath. Will obtain CBC/diff, CMP/eGFR, UA C/s, X-ray of T/L spine, Xray of the left ribs. Observe.

## 2019-04-02 NOTE — Assessment & Plan Note (Signed)
Chronic, multiple sites, continue Prednisone 4mg  qd, Tylenol 500mg  bid.

## 2019-04-02 NOTE — Progress Notes (Signed)
Location:   [facility] Room Number: 161 Place of Service:  ALF (13) Provider: Lennie Odor Brigid Vandekamp NP  Virgie Dad, MD  Patient Care Team: Virgie Dad, MD as PCP - General (Internal Medicine) Minette Manders X, NP as Nurse Practitioner (Internal Medicine) Martinique, Peter M, MD as Consulting Physician (Cardiology) Lavonna Monarch, MD as Consulting Physician (Dermatology) Garald Balding, MD as Consulting Physician (Orthopedic Surgery)  Extended Emergency Contact Information Primary Emergency Contact: Berneta Levins Address: [address] [place],  [address] Johnnette Litter of [place] Phone: [phone] Work Phone: [phone] Mobile Phone: [phone] Relation: Daughter Secondary Emergency Contact: Azalee Course States of [place] Phone: [phone] Mobile Phone: [phone] Relation: Son  Code Status: DNR Goals of care: Advanced Directive information Advanced Directives 04/02/2019  Does Patient Have a Medical Advance Directive? Yes  Type of Advance Directive Out of facility DNR (pink MOST or yellow form);Living will  Does patient want to make changes to medical advance directive? No - Patient declined  Copy of [place] in Chart? -  Would patient like information on creating a medical advance directive? -  Pre-existing out of facility DNR order (yellow form or pink MOST form) Yellow form placed in chart (order not valid for inpatient use)     Chief Complaint  Patient presents with  . Acute Visit    new onset  lower back pain     HPI:  Pt is a 85 y.o. female seen today for an acute visit for left mid to lower back pain for 2-3 days, not positional. HPI was provided with assistance of staff. She resides in [place] for safety and care assistance. RA, managed with Prednisone 76m qd, Tylenol 5038mbid. She denied fall or injury. She denied dysuria, urinary frequency or urgency, chronic urinary incontinent remains no  change. She denied nausea, vomiting or suprapubic pain/discomfort. She is afebrile.    Past Medical History:  Diagnosis Date  . Cervicalgia 08/12/2016  . CHF (congestive heart failure) (HCCarey  . Chronic diastolic CHF (congestive heart failure), NYHA class 3 (HCSeville7/05/2016  . Complication of anesthesia   . Edema 08/12/2016  . Hypertension   . New onset atrial fibrillation (HCLorain05/2017   a. started on Xarelto and Cardizem  . PONV (postoperative nausea and vomiting)   . RA (rheumatoid arthritis) (HCNorlina  . SOB (shortness of breath) 04/2016   Hospitalist  . Vertigo    Past Surgical History:  Procedure Laterality Date  . Biopsy right breast Right    fibrocystic changes  . CARDIOVERSION N/A 07/08/2016   Procedure: CARDIOVERSION;  Surgeon: DaLarey DresserMD;  [facility] Service: Cardiovascular;  Laterality: N/A;  . CERVICAL SPINE SURGERY  2005   Dr. StVertell Limber. HYSTEROSCOPY  2001  . JOINT REPLACEMENT    . REPLACEMENT TOTAL KNEE Right 1996   Wh[place]apidesilateral [id number] WhBaird LyonsD    Allergies  Allergen Reactions  . Morphine Sulfate Other (See Comments)    "extremely nauseated"  . Other Nausea And Vomiting    Pt states she is very sensitive to pain medications.    Allergies as of 04/02/2019      Reactions   Morphine Sulfate Other (See Comments)   "extremely nauseated"   Other Nausea And Vomiting   Pt states she is very sensitive to pain medications.  Medication List       Accurate as of April 02, 2019  2:20 PM. Always use your most recent med list.        acetaminophen 500 MG tablet Commonly known as:  TYLENOL Take 500 mg by mouth 2 (two) times daily.   acetaminophen 325 MG tablet Commonly known as:  TYLENOL Take 650 mg by mouth every 4 (four) hours as needed.   amoxicillin 500 MG tablet Commonly known as:  AMOXIL Take 2,000 mg by mouth as needed. Take 1 hour prior to dental appointment.   BENEFIBER  PO Take 4 scoop by mouth daily.   Biofreeze 10 % Aero Generic drug:  Menthol Apply topically 3 (three) times daily as needed. Apply cream to right side of neck.   CALCIUM 600 PO Take 600-900 mg by mouth. Take 1and 1/2  tablet in the evening with dinner .   calcium carbonate 1500 (600 Ca) MG Tabs tablet Commonly known as:  OSCAL Take 1,500 mg by mouth daily. OTC   Claritin 10 MG tablet Generic drug:  loratadine Take 10 mg by mouth daily.   dextromethorphan-guaiFENesin 30-600 MG 12hr tablet Commonly known as:  MUCINEX DM Take 1 tablet by mouth 2 (two) times daily as needed for cough.   diltiazem 120 MG 24 hr capsule Commonly known as:  CARDIZEM CD Take 1 capsule (120 mg total) by mouth daily.   enalapril 20 MG tablet Commonly known as:  VASOTEC Take 20 mg by mouth 2 (two) times daily.   furosemide 40 MG tablet Commonly known as:  LASIX Take 40 mg by mouth daily.   ipratropium-albuterol 0.5-2.5 (3) MG/3ML Soln Commonly known as:  DUONEB Take 3 mLs by nebulization 3 (three) times daily.   Magnesium Oxide 250 MG Tabs Take 250 mg by mouth daily.   metoprolol succinate 100 MG 24 hr tablet Commonly known as:  TOPROL-XL Take 100 + 50 mg daily to = 150  for blood pressure.   metoprolol tartrate 50 MG tablet Commonly known as:  LOPRESSOR Take 50 mg by mouth daily. Give with 172m for a total of 1554m  OXYGEN Inhale 2 L into the lungs daily as needed (shortness of breath). Use 2 liter as needed for SOB, 02 sat < 90% on room air   Pain Relief Maximum Strength 4 % Ptch Generic drug:  Lidocaine Apply 1 patch topically every 12 (twelve) hours.   potassium chloride SA 20 MEQ tablet Commonly known as:  K-DUR Take 20 mEq by mouth daily.   predniSONE 1 MG tablet Commonly known as:  DELTASONE Take 4 mg by mouth daily with breakfast.   Rivaroxaban 15 MG Tabs tablet Commonly known as:  XARELTO Take 15 mg by mouth daily with supper.   vitamin C 500 MG tablet Commonly  known as:  ASCORBIC ACID Take 500 mg by mouth daily.      ROS was provided with assistance of staff.  Review of Systems  Constitutional: Positive for fatigue. Negative for activity change, appetite change, chills, diaphoresis and fever.  HENT: Positive for hearing loss. Negative for congestion and voice change.   Eyes: Negative for visual disturbance.  Respiratory: Negative for cough, shortness of breath and wheezing.   Cardiovascular: Positive for leg swelling. Negative for chest pain and palpitations.  Gastrointestinal: Negative for abdominal distention, abdominal pain, constipation, diarrhea, nausea and vomiting.  Genitourinary: Negative for difficulty urinating, dysuria, hematuria and urgency.       Chronic incontinent of urine.  Musculoskeletal: Positive for arthralgias, back pain and gait problem. Negative for joint swelling and neck stiffness.  Skin: Negative for color change and pallor.  Neurological: Negative for dizziness, speech difficulty, weakness and headaches.       Memory lapses.   Hematological: Bruises/bleeds easily.  Psychiatric/Behavioral: Negative for agitation, behavioral problems, hallucinations and sleep disturbance. The patient is not nervous/anxious.     Immunization History  Administered Date(s) Administered  . Influenza Whole 09/14/2018  . Influenza-Unspecified 09/13/2015, 09/29/2016, 10/03/2017  . Pneumococcal Conjugate-13 10/03/2017  . Pneumococcal-Unspecified 05/14/2015  . Tdap 04/13/2011   Pertinent  Health Maintenance Due  Topic Date Due  . INFLUENZA VACCINE  07/14/2019  . DEXA SCAN  Completed  . PNA vac Low Risk Adult  Completed   Fall Risk  09/01/2018 08/23/2017 08/12/2016  Falls in the past year? No No No   Functional Status Survey:    Vitals:   04/02/19 1025  BP: (!) 148/72  Pulse: 63  Resp: 20  Temp: 97.9 F (36.6 C)  SpO2: 94%  Weight: 157 lb 3.2 oz (71.3 kg)   Body mass index is 30.7 kg/m. Physical Exam Vitals signs and  nursing note reviewed.  Constitutional:      General: She is not in acute distress.    Appearance: Normal appearance. She is not ill-appearing, toxic-appearing or diaphoretic.  HENT:     Head: Normocephalic and atraumatic.     Nose: Nose normal.     Mouth/Throat:     Mouth: Mucous membranes are moist.  Eyes:     Extraocular Movements: Extraocular movements intact.     Conjunctiva/sclera: Conjunctivae normal.     Pupils: Pupils are equal, round, and reactive to light.  Neck:     Musculoskeletal: Normal range of motion and neck supple.  Cardiovascular:     Rate and Rhythm: Normal rate. Rhythm irregular.     Heart sounds: No murmur.  Pulmonary:     Effort: Pulmonary effort is normal.     Breath sounds: Rales present. No wheezing or rhonchi.     Comments: Bibasilar rales.  Abdominal:     General: There is no distension.     Palpations: Abdomen is soft.     Tenderness: There is no abdominal tenderness. There is no guarding or rebound.  Musculoskeletal:        General: Tenderness present.     Right lower leg: Edema present.     Left lower leg: Edema present.     Comments: Trace edema BLE. W/c for mobility. Left CVA/posterior lower rib cage, mid to lower left back pain palpated. No spinous process tenderness.   Skin:    General: Skin is warm and dry.  Neurological:     General: No focal deficit present.     Mental Status: She is alert. Mental status is at baseline.     Cranial Nerves: No cranial nerve deficit.     Motor: No weakness.     Coordination: Coordination normal.     Gait: Gait abnormal.     Comments: Oriented to person and place.   Psychiatric:        Mood and Affect: Mood normal.        Behavior: Behavior normal.     Labs reviewed: Recent Labs    06/01/18 08/22/18  NA 139 138  K 4.1 4.0  CL 100 101  CO2 31 28  BUN 25* 25*  CREATININE 0.9 0.9  CALCIUM 9.4 9.5   Recent Labs  06/01/18  AST 18  ALT 22  ALKPHOS 49  ALBUMIN 4.1   Recent Labs     09/12/18  WBC 6.9  HGB 13.4  HCT 39  PLT 183   Lab Results  Component Value Date   TSH 1.82 03/07/2018   Lab Results  Component Value Date   HGBA1C 5.4 05/17/2017   Lab Results  Component Value Date   CHOL 185 03/07/2018   HDL 73 (A) 03/07/2018   LDLCALC 86 03/07/2018   TRIG 158 03/07/2018   CHOLHDL 2.4 05/04/2016    Significant Diagnostic Results in last 30 days:  No results found.  Assessment/Plan: Mid back pain on left side Left CVA region, not positional, not associated with deep breath. Will obtain CBC/diff, CMP/eGFR, UA C/s, X-ray of T/L spine, Xray of the left ribs. Observe.   Rheumatoid arthritis (HCC) Chronic, multiple sites, continue Prednisone 17m qd, Tylenol 5024mbid.   Incontinent of urine Chronic, uses adult depends and stays near bathroom.     Family/ staff Communication: plan of care reviewed with the patient and charge nurse.   Labs/tests ordered: CBC/diff, CMP/eGFR, UA C/S, X-ray thoracic/ lumbar spine, left ribs.   Time spend 40 minutes.

## 2019-04-02 NOTE — Assessment & Plan Note (Signed)
Chronic, uses adult depends and stays near bathroom.

## 2019-04-03 DIAGNOSIS — R079 Chest pain, unspecified: Secondary | ICD-10-CM | POA: Diagnosis not present

## 2019-04-03 DIAGNOSIS — M546 Pain in thoracic spine: Secondary | ICD-10-CM | POA: Diagnosis not present

## 2019-04-03 DIAGNOSIS — M545 Low back pain: Secondary | ICD-10-CM | POA: Diagnosis not present

## 2019-04-17 ENCOUNTER — Encounter: Payer: Self-pay | Admitting: Internal Medicine

## 2019-04-17 ENCOUNTER — Non-Acute Institutional Stay: Payer: PPO | Admitting: Internal Medicine

## 2019-04-17 DIAGNOSIS — H1131 Conjunctival hemorrhage, right eye: Secondary | ICD-10-CM | POA: Insufficient documentation

## 2019-04-17 DIAGNOSIS — I480 Paroxysmal atrial fibrillation: Secondary | ICD-10-CM

## 2019-04-17 NOTE — Progress Notes (Signed)
Location:  [facility] Room Number: 761 Place of Service:  ALF (13) Provider:Kary Colaizzi Rene Kocher MD   Virgie Dad, MD  Patient Care Team: Virgie Dad, MD as PCP - General (Internal Medicine) Mast, Man X, NP as Nurse Practitioner (Internal Medicine) Martinique, Peter M, MD as Consulting Physician (Cardiology) Lavonna Monarch, MD as Consulting Physician (Dermatology) Garald Balding, MD as Consulting Physician (Orthopedic Surgery)  Extended Emergency Contact Information Primary Emergency Contact: Berneta Levins Address: [address] [place], [place] [address] Johnnette Litter of [place] Phone: [phone] Work Phone: [phone] Mobile Phone: [phone] Relation: Daughter Secondary Emergency Contact: Azalee Course [place] of [place] Phone: [phone] Mobile Phone: [phone] Relation: Son  Code Status: DNR Goals of care: Advanced Directive information Advanced Directives 04/17/2019  Does Patient Have a Medical Advance Directive? Yes  Type of Advance Directive Living will;Out of facility DNR (pink MOST or yellow form)  Does patient want to make changes to medical advance directive? No - Guardian declined  Copy of [place] in Chart? -  Would patient like information on creating a medical advance directive? -  Pre-existing out of facility DNR order (yellow form or pink MOST form) Yellow form placed in chart (order not valid for inpatient use)     Chief Complaint  Patient presents with  . Acute Visit    red eye     HPI:  Pt is a 85 y.o. female seen today for an acute visit for Red Eye  Patient has h/o Chronic Atrial Fibrillation on Xarelto, Hypertension, Diastolic CHF, Rheumatoid arthritis on Chronic Prednisone  She was noticed to see to have Right Eye redness. She denies any itching, Pain or Photo phobia or impairment of vision No H/o Cough. She is on Xarelto  Past Medical History:  Diagnosis  Date  . Cervicalgia 08/12/2016  . CHF (congestive heart failure) ([facility])   . Chronic diastolic CHF (congestive heart failure), NYHA class 3 ([place]) 06/17/2016  . Complication of anesthesia   . Edema 08/12/2016  . Hypertension   . New onset atrial fibrillation ([place]) 04/2016   a. started on Xarelto and Cardizem  . PONV (postoperative nausea and vomiting)   . RA (rheumatoid arthritis) ([facility])   . SOB (shortness of breath) 04/2016   Hospitalist  . Vertigo    Past Surgical History:  Procedure Laterality Date  . Biopsy right breast Right    fibrocystic changes  . CARDIOVERSION N/A 07/08/2016   Procedure: CARDIOVERSION;  Surgeon: Larey Dresser, MD;  Location: [facility];  Service: Cardiovascular;  Laterality: N/A;  . CERVICAL SPINE SURGERY  2005   Dr. Vertell Limber  . HYSTEROSCOPY  2001  . JOINT REPLACEMENT    . REPLACEMENT TOTAL KNEE Right 1996   [place] Bilateral [phone]   Baird Lyons MD    Allergies  Allergen Reactions  . Morphine Sulfate Other (See Comments)    "extremely nauseated"  . Other Nausea And Vomiting    Pt states she is very sensitive to pain medications.    Outpatient Encounter Medications as of 04/17/2019  Medication Sig  . acetaminophen (TYLENOL) 500 MG tablet Take 500 mg by mouth 2 (two) times daily.   Marland Kitchen amoxicillin (AMOXIL) 500 MG tablet Take 2,000 mg by mouth as needed. Take 1 hour prior to dental appointment.   . Calcium Carbonate (CALCIUM 600 PO) Take 600-900 mg by mouth. Take 1and  1/2  tablet in the evening with dinner .  . calcium carbonate (OSCAL) 1500 (600 Ca) MG TABS tablet Take 1,500 mg by mouth daily. OTC  . dextromethorphan-guaiFENesin (MUCINEX DM) 30-600 MG 12hr tablet Take 1 tablet by mouth 2 (two) times daily as needed for cough.  . diltiazem (CARDIZEM CD) 120 MG 24 hr capsule Take 1 capsule (120 mg total) by mouth daily.  . enalapril (VASOTEC) 20 MG tablet Take 20 mg by mouth 2 (two) times daily.    . furosemide  (LASIX) 40 MG tablet Take 40 mg by mouth daily.   . Lidocaine (PAIN RELIEF MAXIMUM STRENGTH) 4 % PTCH Apply 1 patch topically every 12 (twelve) hours.   Marland Kitchen loratadine (CLARITIN) 10 MG tablet Take 10 mg by mouth daily.  . Magnesium Oxide 250 MG TABS Take 250 mg by mouth daily.  . Menthol (BIOFREEZE) 10 % AERO Apply topically 3 (three) times daily as needed. Apply cream to right side of neck.  . metoprolol succinate (TOPROL-XL) 100 MG 24 hr tablet Take 100 + 50 mg daily to = 150  for blood pressure.  . metoprolol tartrate (LOPRESSOR) 50 MG tablet Take 50 mg by mouth daily. Give with 100mg  for a total of 150mg   . OXYGEN Inhale 2 L into the lungs daily as needed (shortness of breath). Use 2 liter as needed for SOB, 02 sat < 90% on room air   . potassium chloride SA (K-DUR,KLOR-CON) 20 MEQ tablet Take 20 mEq by mouth daily.   . predniSONE (DELTASONE) 1 MG tablet Take 4 mg by mouth daily with breakfast.  . Rivaroxaban (XARELTO) 15 MG TABS tablet Take 15 mg by mouth daily with supper.  . vitamin C (ASCORBIC ACID) 500 MG tablet Take 500 mg by mouth daily.    . Wheat Dextrin (BENEFIBER PO) Take 4 scoop by mouth daily.   Marland Kitchen ipratropium-albuterol (DUONEB) 0.5-2.5 (3) MG/3ML SOLN Take 3 mLs by nebulization 3 (three) times daily.  . [DISCONTINUED] acetaminophen (TYLENOL) 325 MG tablet Take 650 mg by mouth every 4 (four) hours as needed.   No facility-administered encounter medications on file as of 04/17/2019.     Review of Systems  Constitutional: Negative.   HENT: Negative.   Eyes: Positive for redness. Negative for photophobia, pain, discharge, itching and visual disturbance.  Respiratory: Negative.   Cardiovascular: Negative.     Immunization History  Administered Date(s) Administered  . Influenza Whole 09/14/2018  . Influenza-Unspecified 09/13/2015, 09/29/2016, 10/03/2017  . Pneumococcal Conjugate-13 10/03/2017  . Pneumococcal-Unspecified 05/14/2015  . Tdap 04/13/2011   Pertinent  Health  Maintenance Due  Topic Date Due  . INFLUENZA VACCINE  07/14/2019  . DEXA SCAN  Completed  . PNA vac Low Risk Adult  Completed   Fall Risk  09/01/2018 08/23/2017 08/12/2016  Falls in the past year? No No No   Functional Status Survey:    Vitals:   04/17/19 1028  BP: (!) 148/76  Pulse: 76  Resp: 19  Temp: 98.7 F (37.1 C)  SpO2: 95%  Weight: 159 lb (72.1 kg)   Body mass index is 31.05 kg/m. Physical Exam Vitals signs reviewed.  Constitutional:      Appearance: Normal appearance.  HENT:     Head: Normocephalic.     Nose: Nose normal.     Mouth/Throat:     Mouth: Mucous membranes are moist.     Pharynx: Oropharynx is clear.  Eyes:     Pupils: Pupils are equal, round, and reactive to light.  Comments: Right Subconjunctival Hemorrhage  Cardiovascular:     Rate and Rhythm: Normal rate. Rhythm irregular.     Pulses: Normal pulses.     Heart sounds: Normal heart sounds.  Pulmonary:     Effort: Pulmonary effort is normal.     Breath sounds: Normal breath sounds.  Abdominal:     General: Abdomen is flat. Bowel sounds are normal.     Palpations: Abdomen is soft.  Skin:    General: Skin is warm and dry.  Neurological:     General: No focal deficit present.     Mental Status: She is alert.  Psychiatric:        Mood and Affect: Mood normal.        Thought Content: Thought content normal.        Judgment: Judgment normal.     Labs reviewed: Recent Labs    06/01/18 08/22/18 03/06/19  NA 139 138  --   K 4.1 4.0  --   CL 100 101  --   CO2 31 28  --   BUN 25* 25* 23*  CREATININE 0.9 0.9 0.8  CALCIUM 9.4 9.5  --    Recent Labs    06/01/18 03/06/19  AST 18 21  ALT 22 14  ALKPHOS 49  --   ALBUMIN 4.1  --    Recent Labs    09/12/18 03/06/19  WBC 6.9  --   HGB 13.4 13.8  HCT 39 40  PLT 183 164   Lab Results  Component Value Date   TSH 1.82 03/07/2018   Lab Results  Component Value Date   HGBA1C 5.4 05/17/2017   Lab Results  Component Value Date    CHOL 185 03/07/2018   HDL 73 (A) 03/07/2018   LDLCALC 86 03/07/2018   TRIG 158 03/07/2018   CHOLHDL 2.4 05/04/2016    Significant Diagnostic Results in last 30 days:  No results found.  Assessment/Plan Right Subconjunctival hemorrhage Will Use Rewetting drops PRN She is on Xarelto but no changes for now Continue to monitor  Family/ staff Communication:   Labs/tests ordered:   Total time spent in this patient care encounter was  25_  minutes; greater than 50% of the visit spent counseling patient and staff, reviewing records , Labs and coordinating care for problems addressed at this encounter.

## 2019-04-24 ENCOUNTER — Ambulatory Visit: Payer: PPO | Admitting: Cardiology

## 2019-05-22 ENCOUNTER — Non-Acute Institutional Stay: Payer: PPO | Admitting: Internal Medicine

## 2019-05-22 ENCOUNTER — Encounter: Payer: Self-pay | Admitting: Internal Medicine

## 2019-05-22 DIAGNOSIS — S01512A Laceration without foreign body of oral cavity, initial encounter: Secondary | ICD-10-CM | POA: Diagnosis not present

## 2019-05-22 NOTE — Progress Notes (Signed)
Location:  [facility] Room Number: 814 Place of Service:  SNF (31) Provider:Gupta Anjali L,MD   Virgie Dad, MD  Patient Care Team: Virgie Dad, MD as PCP - General (Internal Medicine) Mast, Man X, NP as Nurse Practitioner (Internal Medicine) [place], Peter M, MD as Consulting Physician (Cardiology) Lavonna Monarch, MD as Consulting Physician (Dermatology) Garald Balding, MD as Consulting Physician (Orthopedic Surgery)  Extended Emergency Contact Information Primary Emergency Contact: Berneta Levins Address: [address] [place], [place] [address] Johnnette Litter of [place] Phone: [phone] Work Phone: [phone] Mobile Phone: [phone] Relation: Daughter Secondary Emergency Contact: Azalee Course States of [place] Phone: [phone] Mobile Phone: [phone] Relation: Son  Code Status:DNR  Goals of care: Advanced Directive information Advanced Directives 05/22/2019  Does Patient Have a Medical Advance Directive? Yes  Type of Advance Directive Out of facility DNR (pink MOST or yellow form);Living will  Does patient want to make changes to medical advance directive? No - Patient declined  Copy of [place] in Chart? -  Would patient like information on creating a medical advance directive? -  Pre-existing out of facility DNR order (yellow form or pink MOST form) Yellow form placed in chart (order not valid for inpatient use)     Chief Complaint  Patient presents with  . Acute Visit    Tongue bleed    HPI:  Pt is a 85 y.o. female seen today for an acute visit for Bleeding from her tongue Patient has h/o Chronic Atrial Fibrillation on Xarelto, Hypertension, Diastolic CHF, Rheumatoid arthritis on Chronic Prednisone She cut her tongue while eating salad yesterday and it is bleeding continuously since last night. She says it does not hurt. She also has noticed that her Stool are black in  color Denies feeling Weak or Dizzy  Past Medical History:  Diagnosis Date  . Cervicalgia 08/12/2016  . CHF (congestive heart failure) ([facility])   . Chronic diastolic CHF (congestive heart failure), NYHA class 3 ([place]) 06/17/2016  . Complication of anesthesia   . Edema 08/12/2016  . Hypertension   . New onset atrial fibrillation ([place]) 04/2016   a. started on Xarelto and Cardizem  . PONV (postoperative nausea and vomiting)   . RA (rheumatoid arthritis) ([facility])   . SOB (shortness of breath) 04/2016   Hospitalist  . Vertigo    Past Surgical History:  Procedure Laterality Date  . Biopsy right breast Right    fibrocystic changes  . CARDIOVERSION N/A 07/08/2016   Procedure: CARDIOVERSION;  Surgeon: Larey Dresser, MD;  Location: [facility];  Service: Cardiovascular;  Laterality: N/A;  . CERVICAL SPINE SURGERY  2005   Dr. Vertell Limber  . HYSTEROSCOPY  2001  . JOINT REPLACEMENT    . REPLACEMENT TOTAL KNEE Right 1996   [place] Bilateral [phone]   Baird Lyons MD    Allergies  Allergen Reactions  . Morphine Sulfate Other (See Comments)    "extremely nauseated"  . Other Nausea And Vomiting    Pt states she is very sensitive to pain medications.    Outpatient Encounter Medications as of 05/22/2019  Medication Sig  . acetaminophen (TYLENOL) 500 MG tablet Take 1,000 mg by mouth 2 (two) times daily.   Marland Kitchen amoxicillin (AMOXIL) 500 MG tablet Take 2,000 mg by mouth as needed. Take 1 hour prior to dental appointment.   . Calcium Carbonate (CALCIUM 600 PO)  Take 600-900 mg by mouth. Take 1and 1/2  tablet in the evening with dinner .  . calcium carbonate (OSCAL) 1500 (600 Ca) MG TABS tablet Take 1,500 mg by mouth daily. OTC  . dextromethorphan-guaiFENesin (MUCINEX DM) 30-600 MG 12hr tablet Take 1 tablet by mouth 2 (two) times daily as needed for cough.  . diltiazem (CARDIZEM CD) 120 MG 24 hr capsule Take 1 capsule (120 mg total) by mouth daily.  . enalapril (VASOTEC) 20  MG tablet Take 20 mg by mouth 2 (two) times daily.    . furosemide (LASIX) 40 MG tablet Take 40 mg by mouth daily.   . Lidocaine (PAIN RELIEF MAXIMUM STRENGTH) 4 % PTCH Apply 1 patch topically every 12 (twelve) hours.   Marland Kitchen loratadine (CLARITIN) 10 MG tablet Take 10 mg by mouth daily.  . Magnesium Oxide 250 MG TABS Take 250 mg by mouth daily.  . Menthol (BIOFREEZE) 10 % AERO Apply topically 3 (three) times daily as needed. Apply cream to right side of neck.  . metoprolol succinate (TOPROL-XL) 100 MG 24 hr tablet Take 100 + 50 mg daily to = 150  for blood pressure.  . metoprolol tartrate (LOPRESSOR) 50 MG tablet Take 50 mg by mouth daily. Give with 100mg  for a total of 150mg   . OXYGEN Inhale 2 L into the lungs daily as needed (shortness of breath). Use 2 liter as needed for SOB, 02 sat < 90% on room air   . potassium chloride SA (K-DUR,KLOR-CON) 20 MEQ tablet Take 20 mEq by mouth daily.   . predniSONE (DELTASONE) 1 MG tablet Take 4 mg by mouth daily with breakfast.  . Soft Lens Products (REWETTING DROPS) SOLN 1 drop by Does not apply route every 6 (six) hours as needed.  . vitamin C (ASCORBIC ACID) 500 MG tablet Take 500 mg by mouth daily.    . Wheat Dextrin (BENEFIBER PO) Take 4 scoop by mouth daily.   Marland Kitchen ipratropium-albuterol (DUONEB) 0.5-2.5 (3) MG/3ML SOLN Take 3 mLs by nebulization 3 (three) times daily.  . [DISCONTINUED] Rivaroxaban (XARELTO) 15 MG TABS tablet Take 15 mg by mouth daily with supper.   No facility-administered encounter medications on file as of 05/22/2019.     Review of Systems  Review of Systems  Constitutional: Negative for activity change, appetite change, chills, diaphoresis, fatigue and fever.  HENT: Negative for mouth sores, postnasal drip, rhinorrhea, sinus pain and sore throat.   Respiratory: Negative for apnea, cough, chest tightness, shortness of breath and wheezing.   Cardiovascular: Negative for chest pain, palpitations and leg swelling.  Gastrointestinal:  Negative for abdominal distention, abdominal pain, constipation, diarrhea, nausea and vomiting.  Genitourinary: Negative for dysuria and frequency.  Musculoskeletal: Negative for arthralgias, joint swelling and myalgias.  Skin: Negative for rash.  Neurological: Negative for dizziness, syncope, weakness, light-headedness and numbness.  Psychiatric/Behavioral: Negative for behavioral problems, confusion and sleep disturbance.     Immunization History  Administered Date(s) Administered  . Influenza Whole 09/14/2018  . Influenza-Unspecified 09/13/2015, 09/29/2016, 10/03/2017  . Pneumococcal Conjugate-13 10/03/2017  . Pneumococcal-Unspecified 05/14/2015  . Tdap 04/13/2011   Pertinent  Health Maintenance Due  Topic Date Due  . INFLUENZA VACCINE  07/14/2019  . DEXA SCAN  Completed  . PNA vac Low Risk Adult  Completed   Fall Risk  09/01/2018 08/23/2017 08/12/2016  Falls in the past year? No No No   Functional Status Survey:    Vitals:   05/22/19 1459  BP: 130/70  Pulse: 70  Resp: 20  Temp: 98.3 F (36.8 C)  SpO2: 98%  Weight: 159 lb 3.2 oz (72.2 kg)   Body mass index is 31.09 kg/m. Physical Exam  Constitutional: Oriented to person, place, and time. Well-developed and well-nourished.  HENT:  Head: Normocephalic.  Mouth/Throat: Has midline Laceration of tongue but does not go deeper then that Her mouth was full of Blood Eyes: Pupils are equal, round, and reactive to light.  Neck: Neck supple.  Cardiovascular: Irregular rate and normal heart sounds.  No murmur heard. Pulmonary/Chest: Effort normal and breath sounds normal. No respiratory distress. No wheezes. She has no rales.  Abdominal: Soft. Bowel sounds are normal. No distension. There is no tenderness. There is no rebound.  Musculoskeletal: Mild edema.  Lymphadenopathy: none Neurological: Alert and oriented to person, place, and time.  Skin: Skin is warm and dry.  Psychiatric: Normal mood and affect. Behavior is  normal. Thought content normal.    Labs reviewed: Recent Labs    06/01/18 08/22/18 03/06/19  NA 139 138  --   K 4.1 4.0  --   CL 100 101  --   CO2 31 28  --   BUN 25* 25* 23*  CREATININE 0.9 0.9 0.8  CALCIUM 9.4 9.5  --    Recent Labs    06/01/18 03/06/19  AST 18 21  ALT 22 14  ALKPHOS 49  --   ALBUMIN 4.1  --    Recent Labs    09/12/18 03/06/19  WBC 6.9  --   HGB 13.4 13.8  HCT 39 40  PLT 183 164   Lab Results  Component Value Date   TSH 1.82 03/07/2018   Lab Results  Component Value Date   HGBA1C 5.4 05/17/2017   Lab Results  Component Value Date   CHOL 185 03/07/2018   HDL 73 (A) 03/07/2018   LDLCALC 86 03/07/2018   TRIG 158 03/07/2018   CHOLHDL 2.4 05/04/2016    Significant Diagnostic Results in last 30 days:  No results found.  Assessment/Plan Tongue Bleeding Patient has had significant bleeding from her Tongue But her Laceration is not big for needing any intervention Will discontinue Xarelto for now Monitor Vitals Q4 Repeat CBC on Thurs Ice Chips Puree Diet for now If her bleed does not stop she has to go to ED   Family/ staff Communication:   Labs/tests ordered: CBC and BMP in 2 days

## 2019-05-24 DIAGNOSIS — R58 Hemorrhage, not elsewhere classified: Secondary | ICD-10-CM | POA: Diagnosis not present

## 2019-05-24 LAB — CBC AND DIFFERENTIAL
HCT: 36 (ref 36–46)
Hemoglobin: 12.2 (ref 12.0–16.0)
Platelets: 154 (ref 150–399)
WBC: 5.3

## 2019-05-24 LAB — BASIC METABOLIC PANEL
BUN: 29 — AB (ref 4–21)
Creatinine: 0.8 (ref 0.5–1.1)
Glucose: 95
Potassium: 4.2 (ref 3.4–5.3)
Sodium: 140 (ref 137–147)

## 2019-05-25 ENCOUNTER — Non-Acute Institutional Stay: Payer: PPO | Admitting: Internal Medicine

## 2019-05-25 ENCOUNTER — Encounter: Payer: Self-pay | Admitting: Internal Medicine

## 2019-05-25 DIAGNOSIS — E785 Hyperlipidemia, unspecified: Secondary | ICD-10-CM | POA: Diagnosis not present

## 2019-05-25 DIAGNOSIS — I5032 Chronic diastolic (congestive) heart failure: Secondary | ICD-10-CM

## 2019-05-25 DIAGNOSIS — S01512D Laceration without foreign body of oral cavity, subsequent encounter: Secondary | ICD-10-CM | POA: Diagnosis not present

## 2019-05-25 DIAGNOSIS — F418 Other specified anxiety disorders: Secondary | ICD-10-CM | POA: Diagnosis not present

## 2019-05-25 DIAGNOSIS — M069 Rheumatoid arthritis, unspecified: Secondary | ICD-10-CM | POA: Diagnosis not present

## 2019-05-25 DIAGNOSIS — I480 Paroxysmal atrial fibrillation: Secondary | ICD-10-CM | POA: Diagnosis not present

## 2019-05-25 DIAGNOSIS — I10 Essential (primary) hypertension: Secondary | ICD-10-CM | POA: Diagnosis not present

## 2019-05-25 NOTE — Progress Notes (Signed)
Location:  [facility] Room Number: [place] of Service:  ALF [phone]) Provider:Sanjay Broadfoot L,MD  Virgie Dad, MD  Patient Care Team: Virgie Dad, MD as PCP - General (Internal Medicine) Mast, Man X, NP as Nurse Practitioner (Internal Medicine) Martinique, Peter M, MD as Consulting Physician (Cardiology) Lavonna Monarch, MD as Consulting Physician (Dermatology) Garald Balding, MD as Consulting Physician (Orthopedic Surgery)  Extended Emergency Contact Information Primary Emergency Contact: Berneta Levins Address: [address] [place], [place] [address] Johnnette Litter of [place] Phone: [phone] Work Phone: [phone] Mobile Phone: [phone] Relation: Daughter Secondary Emergency Contact: Azalee Course States of [place] Phone: [phone] Mobile Phone: [phone] Relation: Son  Code Status: DNR Goals of care: Advanced Directive information Advanced Directives 05/25/2019  Does Patient Have a Medical Advance Directive? Yes  Type of Advance Directive Living will;Out of facility DNR (pink MOST or yellow form)  Does patient want to make changes to medical advance directive? No - Patient declined  Copy of [place] in Chart? -  Would patient like information on creating a medical advance directive? -  Pre-existing out of facility DNR order (yellow form or pink MOST form) Yellow form placed in chart (order not valid for inpatient use)     Chief Complaint  Patient presents with  . Medical Management of Chronic Issues    Routine visit, follow up with tongue bleed     HPI:  Pt is a 85 y.o. female seen today for medical management of chronic diseases.   Patient has h/o Chronic Atrial Fibrillation on Xarelto, Hypertension, Diastolic CHF, Rheumatoid arthritis on Chronic Prednisone She was seen today to follow her Tongue Laceration.  Few days ago patient was eating salad and had a tongue bite.   Since she was on Xarelto she was bleeding a lot even having black tarry stools.  Her Xarelto was held.  She was put on a pured diet. Since then the laceration is resolved she still has some black stools but not that frequent.  Her CBC showed hemoglobin was stable. Patient stable otherwise.  She is lives in [place] and mostly uses wheelchair. She did not have any cough shortness of breath.  Past Medical History:  Diagnosis Date  . Cervicalgia 08/12/2016  . CHF (congestive heart failure) ([place])   . Chronic diastolic CHF (congestive heart failure), NYHA class 3 ([place]) 06/17/2016  . Complication of anesthesia   . Edema 08/12/2016  . Hypertension   . New onset atrial fibrillation ([facility]) 04/2016   a. started on Xarelto and Cardizem  . PONV (postoperative nausea and vomiting)   . RA (rheumatoid arthritis) ([place])   . SOB (shortness of breath) 04/2016   Hospitalist  . Vertigo    Past Surgical History:  Procedure Laterality Date  . Biopsy right breast Right    fibrocystic changes  . CARDIOVERSION N/A 07/08/2016   Procedure: CARDIOVERSION;  Surgeon: Larey Dresser, MD;  Location: [facility];  Service: Cardiovascular;  Laterality: N/A;  . CERVICAL SPINE SURGERY  2005   Dr. Vertell Limber  . HYSTEROSCOPY  2001  . JOINT REPLACEMENT    . REPLACEMENT TOTAL KNEE Right 1996   [place] Bilateral [id number]   Baird Lyons MD    Allergies  Allergen Reactions  . Morphine Sulfate Other (See Comments)    "extremely nauseated"  . Other Nausea And Vomiting    Pt states  she is very sensitive to pain medications.    Outpatient Encounter Medications as of 05/25/2019  Medication Sig  . acetaminophen (TYLENOL) 500 MG tablet Take 1,000 mg by mouth 2 (two) times daily.   Marland Kitchen amoxicillin (AMOXIL) 500 MG tablet Take 2,000 mg by mouth as needed. Take 1 hour prior to dental appointment.   . Calcium Carbonate (CALCIUM 600 PO) Take 600-900 mg by mouth. Take 1and 1/2  tablet in the evening with  dinner .  . calcium carbonate (OSCAL) 1500 (600 Ca) MG TABS tablet Take 1,500 mg by mouth daily. OTC  . dextromethorphan-guaiFENesin (MUCINEX DM) 30-600 MG 12hr tablet Take 1 tablet by mouth 2 (two) times daily as needed for cough.  . diltiazem (CARDIZEM CD) 120 MG 24 hr capsule Take 1 capsule (120 mg total) by mouth daily.  . enalapril (VASOTEC) 20 MG tablet Take 20 mg by mouth 2 (two) times daily.    . furosemide (LASIX) 40 MG tablet Take 40 mg by mouth daily.   . Lidocaine (PAIN RELIEF MAXIMUM STRENGTH) 4 % PTCH Apply 1 patch topically every 12 (twelve) hours.   Marland Kitchen loratadine (CLARITIN) 10 MG tablet Take 10 mg by mouth daily.  . Magnesium Oxide 250 MG TABS Take 250 mg by mouth daily.  . Menthol (BIOFREEZE) 10 % AERO Apply topically 3 (three) times daily as needed. Apply cream to right side of neck.  . metoprolol succinate (TOPROL-XL) 100 MG 24 hr tablet Take 100 + 50 mg daily to = 150  for blood pressure.  . metoprolol tartrate (LOPRESSOR) 50 MG tablet Take 50 mg by mouth daily. Give with 100mg  for a total of 150mg   . OXYGEN Inhale 2 L into the lungs daily as needed (shortness of breath). Use 2 liter as needed for SOB, 02 sat < 90% on room air   . potassium chloride SA (K-DUR,KLOR-CON) 20 MEQ tablet Take 20 mEq by mouth daily.   . predniSONE (DELTASONE) 1 MG tablet Take 4 mg by mouth daily with breakfast.  . Soft Lens Products (REWETTING DROPS) SOLN 1 drop by Does not apply route every 6 (six) hours as needed.  . vitamin C (ASCORBIC ACID) 500 MG tablet Take 500 mg by mouth daily.    . Wheat Dextrin (BENEFIBER PO) Take 4 scoop by mouth daily.   Marland Kitchen ipratropium-albuterol (DUONEB) 0.5-2.5 (3) MG/3ML SOLN Take 3 mLs by nebulization 3 (three) times daily.   No facility-administered encounter medications on file as of 05/25/2019.     Review of Systems  Constitutional: Negative.   HENT: Negative.   Cardiovascular: Positive for leg swelling.  Genitourinary: Positive for frequency.   Musculoskeletal: Positive for gait problem.  Skin: Negative.   Psychiatric/Behavioral: Negative.   All other systems reviewed and are negative.   Immunization History  Administered Date(s) Administered  . Influenza Whole 09/14/2018  . Influenza-Unspecified 09/13/2015, 09/29/2016, 10/03/2017  . Pneumococcal Conjugate-13 10/03/2017  . Pneumococcal-Unspecified 05/14/2015  . Tdap 04/13/2011   Pertinent  Health Maintenance Due  Topic Date Due  . INFLUENZA VACCINE  07/14/2019  . DEXA SCAN  Completed  . PNA vac Low Risk Adult  Completed   Fall Risk  09/01/2018 08/23/2017 08/12/2016  Falls in the past year? No No No   Functional Status Survey:    Vitals:   05/25/19 1208  BP: 130/70  Pulse: 76  Resp: 18  Temp: 98.5 F (36.9 C)  SpO2: 98%  Weight: 159 lb 3.2 oz (72.2 kg)   Body mass index is  31.09 kg/m. Physical Exam Vitals signs reviewed.  Constitutional:      Appearance: Normal appearance. She is normal weight.  HENT:     Head: Normocephalic.     Nose: Nose normal.     Mouth/Throat:     Comments: Laceration Healed no More Bleeding Eyes:     Pupils: Pupils are equal, round, and reactive to light.  Neck:     Musculoskeletal: Neck supple.  Cardiovascular:     Rate and Rhythm: Normal rate and regular rhythm.     Pulses: Normal pulses.  Pulmonary:     Effort: Pulmonary effort is normal.     Breath sounds: Normal breath sounds.  Abdominal:     General: Abdomen is flat. Bowel sounds are normal.     Palpations: Abdomen is soft.  Musculoskeletal:     Comments: Mild swelling Bilateral  Skin:    General: Skin is warm.  Neurological:     General: No focal deficit present.     Mental Status: She is alert and oriented to person, place, and time.  Psychiatric:        Mood and Affect: Mood normal.        Thought Content: Thought content normal.     Labs reviewed: Recent Labs    06/01/18 08/22/18 03/06/19  NA 139 138  --   K 4.1 4.0  --   CL 100 101  --   CO2 31 28   --   BUN 25* 25* 23*  CREATININE 0.9 0.9 0.8  CALCIUM 9.4 9.5  --    Recent Labs    06/01/18 03/06/19  AST 18 21  ALT 22 14  ALKPHOS 49  --   ALBUMIN 4.1  --    Recent Labs    09/12/18 03/06/19  WBC 6.9  --   HGB 13.4 13.8  HCT 39 40  PLT 183 164   Lab Results  Component Value Date   TSH 1.82 03/07/2018   Lab Results  Component Value Date   HGBA1C 5.4 05/17/2017   Lab Results  Component Value Date   CHOL 185 03/07/2018   HDL 73 (A) 03/07/2018   LDLCALC 86 03/07/2018   TRIG 158 03/07/2018   CHOLHDL 2.4 05/04/2016    Significant Diagnostic Results in last 30 days:  No results found.  Assessment/Plan Essential hypertension - Plan:  Stable on Vasotec Bmp showed Stable Renal Fucntion  Tongue laceration, subsequent encounter - Plan:  HGB stable Laceration well healed now  Chronic diastolic CHF (congestive heart failure) EF was 65% in 2017 On Lasix Weight is stable  Paroxysmal atrial fibrillation (HCC) - Plan:  Rate control  On lopressor and Cardizem Restart her Xarelto  Rheumatoid arthritis  On Chronic Prednisone Depression with anxiety -    Hyperlipidemia,  On Crestor    Patient would need follow up of her DEXA scan and lipid next visit  Family/ staff Communication:   Labs/tests ordered:    Total time spent in this patient care encounter was  40_  minutes; greater than 50% of the visit spent counseling patient and staff, reviewing records , Labs and coordinating care for problems addressed at this encounter.

## 2019-06-04 ENCOUNTER — Encounter: Payer: Self-pay | Admitting: *Deleted

## 2019-06-19 DIAGNOSIS — Z1159 Encounter for screening for other viral diseases: Secondary | ICD-10-CM | POA: Diagnosis not present

## 2019-07-02 DIAGNOSIS — B342 Coronavirus infection, unspecified: Secondary | ICD-10-CM | POA: Diagnosis not present

## 2019-07-11 DIAGNOSIS — Z03818 Encounter for observation for suspected exposure to other biological agents ruled out: Secondary | ICD-10-CM | POA: Diagnosis not present

## 2019-07-18 DIAGNOSIS — Z1159 Encounter for screening for other viral diseases: Secondary | ICD-10-CM | POA: Diagnosis not present

## 2019-07-24 DIAGNOSIS — R2681 Unsteadiness on feet: Secondary | ICD-10-CM | POA: Diagnosis not present

## 2019-07-24 DIAGNOSIS — G458 Other transient cerebral ischemic attacks and related syndromes: Secondary | ICD-10-CM | POA: Diagnosis not present

## 2019-07-24 DIAGNOSIS — M6281 Muscle weakness (generalized): Secondary | ICD-10-CM | POA: Diagnosis not present

## 2019-08-22 ENCOUNTER — Encounter: Payer: Self-pay | Admitting: Nurse Practitioner

## 2019-08-22 ENCOUNTER — Non-Acute Institutional Stay: Payer: PPO | Admitting: Nurse Practitioner

## 2019-08-22 DIAGNOSIS — R600 Localized edema: Secondary | ICD-10-CM

## 2019-08-22 DIAGNOSIS — L03116 Cellulitis of left lower limb: Secondary | ICD-10-CM | POA: Diagnosis not present

## 2019-08-22 DIAGNOSIS — I480 Paroxysmal atrial fibrillation: Secondary | ICD-10-CM

## 2019-08-22 LAB — BASIC METABOLIC PANEL
Calcium: 9.3
Carbon Dioxide, Total: 29
Chloride: 103
EGFR (Non-African Amer.): 63

## 2019-08-22 NOTE — Assessment & Plan Note (Signed)
Heart rate is in control, continue Diltiazem 120mg  qd, Metoprolol 150mg  qd, Xarelto 15mg . Observe.

## 2019-08-22 NOTE — Assessment & Plan Note (Signed)
Chronic swelling BLE, L>R today, continue Furosemide 40mg  qd. TED

## 2019-08-22 NOTE — Progress Notes (Signed)
Location:  [facility] Room Number: [id number] Place of Service:  ALF [phone]) Provider:  Hawk Mones, Lennie Odor NP  Virgie Dad, MD  Patient Care Team: Virgie Dad, MD as PCP - General (Internal Medicine) Lajada Janes X, NP as Nurse Practitioner (Internal Medicine) Martinique, Peter M, MD as Consulting Physician (Cardiology) Lavonna Monarch, MD as Consulting Physician (Dermatology) Garald Balding, MD as Consulting Physician (Orthopedic Surgery)  Extended Emergency Contact Information Primary Emergency Contact: Berneta Levins Address: [address] [place], [place] [address] Johnnette Litter of [place] Phone: [phone] Work Phone: [phone] Mobile Phone: [phone] Relation: Daughter Secondary Emergency Contact: Azalee Course States of [place] Phone: [phone] Mobile Phone: [phone] Relation: Son  Code Status: DNR Goals of care: Advanced Directive information Advanced Directives 08/22/2019  Does Patient Have a Medical Advance Directive? Yes  Type of Advance Directive Out of facility DNR (pink MOST or yellow form)  Does patient want to make changes to medical advance directive? No - Patient declined  Copy of [place] in Chart? -  Would patient like information on creating a medical advance directive? -  Pre-existing out of facility DNR order (yellow form or pink MOST form) Yellow form placed in chart (order not valid for inpatient use)     Chief Complaint  Patient presents with  . Acute Visit    C/o - LLE wound    HPI:  Pt is a 85 y.o. female seen today for an acute visit for anterior left lower redness, swelling, tenderness, warmth around the original skin tear. A small amount of yellow drainage seen on the skin tear. Hx CHF/ PVD//edema BLE, on Furosemide 40mg  qd. AFib, heart rate is in control, on Diltiazem 120mg  qd, Metoprolol 150mg  qd, Xarelto 15mg  qd. OA pain, stable on Tylenol 1000mg  bid.    Past  Medical History:  Diagnosis Date  . Cervicalgia 08/12/2016  . CHF (congestive heart failure) ([facility])   . Chronic diastolic CHF (congestive heart failure), NYHA class 3 ([facility] [facility]) 06/17/2016  . Complication of anesthesia   . Edema 08/12/2016  . Hypertension   . New onset atrial fibrillation ([facility]) 04/2016   a. started on Xarelto and Cardizem  . PONV (postoperative nausea and vomiting)   . RA (rheumatoid arthritis) ([facility])   . SCC (squamous cell carcinoma) KA 09/15/2016   Left forearm - tx p bx  . SOB (shortness of breath) 04/2016   Hospitalist  . Vertigo    Past Surgical History:  Procedure Laterality Date  . Biopsy right breast Right    fibrocystic changes  . CARDIOVERSION N/A 07/08/2016   Procedure: CARDIOVERSION;  Surgeon: Larey Dresser, MD;  Location: [facility];  Service: Cardiovascular;  Laterality: N/A;  . CERVICAL SPINE SURGERY  2005   Dr. Vertell Limber  . HYSTEROSCOPY  2001  . JOINT REPLACEMENT    . REPLACEMENT TOTAL KNEE Right 1996   [place] Bilateral [id number]   Baird Lyons MD    Allergies  Allergen Reactions  . Morphine Sulfate Other (See Comments)    "extremely nauseated"  . Other Nausea And Vomiting    Pt states she is very sensitive to pain medications.    Outpatient Encounter Medications as of 08/22/2019  Medication Sig  . acetaminophen (TYLENOL) 500 MG tablet Take 1,000 mg by mouth 2 (two) times daily.   Marland Kitchen amoxicillin (AMOXIL) 500 MG tablet Take 2,000 mg by mouth as  needed. Take 1 hour prior to dental appointment.   . Calcium Carbonate (CALCIUM 600 PO) Take 1and 1/2  Tablet = 900 mg in the evening with dinner .  . calcium carbonate (OSCAL) 1500 (600 Ca) MG TABS tablet Take 1,500 mg by mouth daily. OTC  . dextromethorphan-guaiFENesin (MUCINEX DM) 30-600 MG 12hr tablet Take 1 tablet by mouth 2 (two) times daily as needed for cough.  . diltiazem (CARDIZEM CD) 120 MG 24 hr capsule Take 1 capsule (120 mg total) by mouth daily.  . enalapril  (VASOTEC) 20 MG tablet Take 20 mg by mouth 2 (two) times daily.    . furosemide (LASIX) 40 MG tablet Take 40 mg by mouth daily.   . Lidocaine (PAIN RELIEF MAXIMUM STRENGTH) 4 % PTCH Apply 1 patch topically every 12 (twelve) hours as needed. Apply patch to right side of neck on for 12 hours and off 12 hours at night.  . loratadine (CLARITIN) 10 MG tablet Take 10 mg by mouth daily.  . Magnesium Oxide 250 MG TABS Take 250 mg by mouth daily.  . Menthol (BIOFREEZE) 10 % AERO Apply topically 3 (three) times daily as needed. Apply cream to right side of neck.  . Metoprolol Succinate 100 MG CS24 Take 150 mg by mouth at bedtime.  . Metoprolol Succinate 50 MG CS24 Take 150 mg by mouth daily.  . OXYGEN Inhale 2 L into the lungs daily as needed (shortness of breath). Use 2 liter as needed for SOB, 02 sat < 90% on room air   . potassium chloride SA (K-DUR,KLOR-CON) 20 MEQ tablet Take 20 mEq by mouth daily.   . predniSONE (DELTASONE) 1 MG tablet Take 4 mg by mouth daily with breakfast.  . Rivaroxaban (XARELTO) 15 MG TABS tablet Take 15 mg by mouth daily with supper.  . Soft Lens Products (REWETTING DROPS) SOLN 1 drop by Does not apply route every 6 (six) hours as needed.  . vitamin C (ASCORBIC ACID) 500 MG tablet Take 500 mg by mouth daily.    . Wheat Dextrin (BENEFIBER PO) Take 4 scoop by mouth daily.   . [DISCONTINUED] ipratropium-albuterol (DUONEB) 0.5-2.5 (3) MG/3ML SOLN Take 3 mLs by nebulization 3 (three) times daily.  . [DISCONTINUED] metoprolol succinate (TOPROL-XL) 100 MG 24 hr tablet Take 100 + 50 mg daily to = 150  for blood pressure.  . [DISCONTINUED] metoprolol tartrate (LOPRESSOR) 50 MG tablet Take 50 mg by mouth daily. Give with 100mg  for a total of 150mg    No facility-administered encounter medications on file as of 08/22/2019.    ROS was provided with assistance of staff.  Review of Systems  Constitutional: Negative for activity change, appetite change, chills, diaphoresis, fatigue, fever and  unexpected weight change.  HENT: Positive for hearing loss. Negative for congestion and voice change.   Respiratory: Positive for shortness of breath. Negative for cough and wheezing.        DOE  Cardiovascular: Positive for leg swelling. Negative for chest pain and palpitations.  Gastrointestinal: Negative for abdominal distention, abdominal pain, constipation, diarrhea, nausea and vomiting.  Genitourinary: Negative for difficulty urinating, dysuria and urgency.  Musculoskeletal: Positive for arthralgias, gait problem and myalgias.  Skin: Positive for color change and wound.  Neurological: Negative for dizziness, speech difficulty, weakness and headaches.       Memory lapses.   Psychiatric/Behavioral: Negative for agitation, behavioral problems, hallucinations and sleep disturbance. The patient is not nervous/anxious.     Immunization History  Administered Date(s) Administered  .  Influenza Whole 09/14/2018  . Influenza-Unspecified 09/13/2015, 09/29/2016, 10/03/2017  . Pneumococcal Conjugate-13 10/03/2017  . Pneumococcal-Unspecified 05/14/2015  . Tdap 04/13/2011   Pertinent  Health Maintenance Due  Topic Date Due  . INFLUENZA VACCINE  07/14/2019  . DEXA SCAN  Completed  . PNA vac Low Risk Adult  Completed   Fall Risk  09/01/2018 08/23/2017 08/12/2016  Falls in the past year? No No No   Functional Status Survey:    Vitals:   08/22/19 1133  BP: 120/68  Pulse: 81  Resp: 18  Temp: 98.2 F (36.8 C)  SpO2: 96%  Weight: 159 lb 9.6 oz (72.4 kg)  Height: 5' (1.524 m)   Body mass index is 31.17 kg/m. Physical Exam Vitals signs and nursing note reviewed.  Constitutional:      General: She is not in acute distress.    Appearance: Normal appearance. She is not ill-appearing, toxic-appearing or diaphoretic.  HENT:     Head: Normocephalic and atraumatic.     Nose: Nose normal.     Mouth/Throat:     Mouth: Mucous membranes are moist.  Eyes:     Extraocular Movements:  Extraocular movements intact.     Conjunctiva/sclera: Conjunctivae normal.     Pupils: Pupils are equal, round, and reactive to light.  Neck:     Musculoskeletal: Normal range of motion and neck supple.  Cardiovascular:     Rate and Rhythm: Normal rate and regular rhythm.  Pulmonary:     Breath sounds: No wheezing, rhonchi or rales.  Abdominal:     General: Bowel sounds are normal.     Palpations: Abdomen is soft.     Tenderness: There is no abdominal tenderness. There is no right CVA tenderness, left CVA tenderness, guarding or rebound.  Musculoskeletal:     Right lower leg: Edema present.     Left lower leg: Edema present.     Comments: 1+ edema BLE, L>R, w/c for mobility  Skin:    General: Skin is warm and dry.     Findings: Erythema present.     Comments: Left anterior lower leg, skin tear is not healing, yellow drainage seen, peri wound redness, warmth, tenderness, swelling noted.   Neurological:     General: No focal deficit present.     Mental Status: She is alert. Mental status is at baseline.     Cranial Nerves: No cranial nerve deficit.     Motor: No weakness.     Coordination: Coordination normal.     Gait: Gait abnormal.     Comments: Oriented to person, place.   Psychiatric:        Mood and Affect: Mood normal.        Behavior: Behavior normal.        Thought Content: Thought content normal.     Labs reviewed: Recent Labs    03/06/19 05/24/19  NA  --  140  K  --  4.2  CL  --  103  CO2  --  29  BUN 23* 29*  CREATININE 0.8 0.8  CALCIUM  --  9.3   Recent Labs    03/06/19  AST 21  ALT 14   Recent Labs    09/12/18 03/06/19 05/24/19  WBC 6.9  --  5.3  HGB 13.4 13.8 12.2  HCT 39 40 36  PLT 183 164 154   Lab Results  Component Value Date   TSH 1.82 03/07/2018   Lab Results  Component Value Date  HGBA1C 5.4 05/17/2017   Lab Results  Component Value Date   CHOL 185 03/07/2018   HDL 73 (A) 03/07/2018   LDLCALC 86 03/07/2018   TRIG 158  03/07/2018   CHOLHDL 2.4 05/04/2016    Significant Diagnostic Results in last 30 days:  No results found.  Assessment/Plan Cellulitis of left anterior lower leg 08/22/19 developed from a skin tear, small open area with yellow drainage, peri wound redness, swelling, tenderness, and warmth. Will treat with Doxycycline 100mg  bid x 7 days, FloraStor bid x 7 days. Apply Bactroban oint bid x 10 day or healed.   Edema leg Chronic swelling BLE, L>R today, continue Furosemide 40mg  qd. TED  Atrial fibrillation (HCC) Heart rate is in control, continue Diltiazem 120mg  qd, Metoprolol 150mg  qd, Xarelto 15mg . Observe.        Family/ staff Communication: plan of care reviewed with the patient and charge nurse.   Labs/tests ordered:  none  Time spend 40 minutes.

## 2019-08-22 NOTE — Assessment & Plan Note (Signed)
08/22/19 developed from a skin tear, small open area with yellow drainage, peri wound redness, swelling, tenderness, and warmth. Will treat with Doxycycline 100mg  bid x 7 days, FloraStor bid x 7 days. Apply Bactroban oint bid x 10 day or healed.

## 2019-08-31 ENCOUNTER — Ambulatory Visit: Payer: PPO | Admitting: Cardiology

## 2019-09-19 ENCOUNTER — Non-Acute Institutional Stay: Payer: PPO | Admitting: Nurse Practitioner

## 2019-09-19 ENCOUNTER — Encounter: Payer: Self-pay | Admitting: Nurse Practitioner

## 2019-09-19 DIAGNOSIS — M0579 Rheumatoid arthritis with rheumatoid factor of multiple sites without organ or systems involvement: Secondary | ICD-10-CM | POA: Diagnosis not present

## 2019-09-19 DIAGNOSIS — E785 Hyperlipidemia, unspecified: Secondary | ICD-10-CM

## 2019-09-19 DIAGNOSIS — I4811 Longstanding persistent atrial fibrillation: Secondary | ICD-10-CM

## 2019-09-19 DIAGNOSIS — I10 Essential (primary) hypertension: Secondary | ICD-10-CM

## 2019-09-19 DIAGNOSIS — I5032 Chronic diastolic (congestive) heart failure: Secondary | ICD-10-CM | POA: Diagnosis not present

## 2019-09-19 NOTE — Assessment & Plan Note (Signed)
Heart rate is in control, continue Metoprolol 150mg  qd, Diltiazem 120mg  qd, Xarelto 15mg  qd.

## 2019-09-19 NOTE — Assessment & Plan Note (Signed)
Update lipid panel.  

## 2019-09-19 NOTE — Assessment & Plan Note (Signed)
Blood pressure is controlled, continue Enalapril 20mg  bid, Metoprolol 150mg  qd, Diltiazem 120mg  qd.

## 2019-09-19 NOTE — Assessment & Plan Note (Signed)
Multiple sites, stable, continue Prednisone 4mg  qd, Tylenol 1000mg  bid.

## 2019-09-19 NOTE — Assessment & Plan Note (Signed)
Compensated clinically, trace edema BLE remains no change, continue Furosemide 40mg  qd.

## 2019-09-19 NOTE — Progress Notes (Signed)
Location:   [facility] Room Number: [id number] Place of Service:  [facility] (13) Provider:  Eman Rynders NP  Virgie Dad, MD  Patient Care Team: Virgie Dad, MD as PCP - General (Internal Medicine) Majesty Oehlert X, NP as Nurse Practitioner (Internal Medicine) Martinique, Peter M, MD as Consulting Physician (Cardiology) Lavonna Monarch, MD as Consulting Physician (Dermatology) Garald Balding, MD as Consulting Physician (Orthopedic Surgery)  Extended Emergency Contact Information Primary Emergency Contact: Berneta Levins Address: [address] [place], [place] [address] Johnnette Litter of [place] Phone: [phone] Work Phone: [phone] Mobile Phone: [phone] Relation: Daughter Secondary Emergency Contact: Azalee Course [place] of [place] Phone: [phone] Mobile Phone: [phone] Relation: Son  Code Status:  DNR Goals of care: Advanced Directive information Advanced Directives 09/19/2019  Does Patient Have a Medical Advance Directive? Yes  Type of Advance Directive Out of facility DNR (pink MOST or yellow form)  Does patient want to make changes to medical advance directive? No - Patient declined  Copy of [place] in Chart? -  Would patient like information on creating a medical advance directive? -  Pre-existing out of facility DNR order (yellow form or pink MOST form) Yellow form placed in chart (order not valid for inpatient use)     Chief Complaint  Patient presents with  . Medical Management of Chronic Issues  . Health Maintenance    Influenza vaccine    HPI:  Pt is a 85 y.o. female seen today for medical management of chronic diseases.     The patient has Hx of CHF/edema BLE, on Furosemide 40mg  qd. Afib, heart rate is in control, on Diltiazem 120mg  qd, Metoprolol 150mg  qd,  Xarelto 15mg  qd. HTN, blood pressure is controlled, on Enalapril 20mg  bid along with Metoprolol and Diltiazem. RA, pain, multiples sites, on  Prednisone 4mg  qd, Tylenol 1000mg  bid.    Past Medical History:  Diagnosis Date  . Cervicalgia 08/12/2016  . CHF (congestive heart failure) ([place])   . Chronic diastolic CHF (congestive heart failure), NYHA class 3 ([place]) 06/17/2016  . Complication of anesthesia   . Edema 08/12/2016  . Hypertension   . New onset atrial fibrillation ([place]) 04/2016   a. started on Xarelto and Cardizem  . PONV (postoperative nausea and vomiting)   . RA (rheumatoid arthritis) ([place])   . SCC (squamous cell carcinoma) KA 09/15/2016   Left forearm - tx p bx  . SOB (shortness of breath) 04/2016   Hospitalist  . Vertigo    Past Surgical History:  Procedure Laterality Date  . Biopsy right breast Right    fibrocystic changes  . CARDIOVERSION N/A 07/08/2016   Procedure: CARDIOVERSION;  Surgeon: Larey Dresser, MD;  Location: [facility];  Service: Cardiovascular;  Laterality: N/A;  . CERVICAL SPINE SURGERY  2005   Dr. Vertell Limber  . HYSTEROSCOPY  2001  . JOINT REPLACEMENT    . REPLACEMENT TOTAL KNEE Right 1996   [place] Bilateral [id number]   Baird Lyons MD    Allergies  Allergen Reactions  . Morphine Sulfate Other (See Comments)    "extremely nauseated"  . Other Nausea And Vomiting    Pt states she is very sensitive to pain medications.    Allergies as of 09/19/2019      Reactions   Morphine Sulfate Other (See Comments)   "extremely nauseated"   Other Nausea And Vomiting  Pt states she is very sensitive to pain medications.      Medication List       Accurate as of September 19, 2019  4:02 PM. If you have any questions, ask your nurse or doctor.        acetaminophen 500 MG tablet Commonly known as: TYLENOL Take 1,000 mg by mouth 2 (two) times daily.   amoxicillin 500 MG tablet Commonly known as: AMOXIL Take 2,000 mg by mouth as needed. Take 1 hour prior to dental appointment.   BENEFIBER PO Take 4 scoop by mouth daily.   Biofreeze 10 % Aero Generic  drug: Menthol Apply topically 3 (three) times daily as needed. Apply cream to right side of neck.   CALCIUM 600 PO Take 1and 1/2  Tablet = 900 mg in the evening with dinner .   calcium carbonate 1500 (600 Ca) MG Tabs tablet Commonly known as: OSCAL Take 1,500 mg by mouth daily. OTC   Claritin 10 MG tablet Generic drug: loratadine Take 10 mg by mouth daily.   dextromethorphan-guaiFENesin 30-600 MG 12hr tablet Commonly known as: MUCINEX DM Take 1 tablet by mouth 2 (two) times daily as needed for cough.   diltiazem 120 MG 24 hr capsule Commonly known as: CARDIZEM CD Take 1 capsule (120 mg total) by mouth daily.   enalapril 20 MG tablet Commonly known as: VASOTEC Take 20 mg by mouth 2 (two) times daily.   furosemide 40 MG tablet Commonly known as: LASIX Take 40 mg by mouth daily.   Magnesium Oxide 250 MG Tabs Take 250 mg by mouth daily.   Metoprolol Succinate 100 MG Cs24 Take 150 mg by mouth at bedtime.   Metoprolol Succinate 50 MG Cs24 Take 150 mg by mouth daily.   OXYGEN Inhale 2 L into the lungs daily as needed (shortness of breath). Use 2 liter as needed for SOB, 02 sat < 90% on room air   Pain Relief Maximum Strength 4 % Ptch Generic drug: Lidocaine Apply 1 patch topically every 12 (twelve) hours as needed. Apply patch to right side of neck on for 12 hours and off 12 hours at night.   potassium chloride SA 20 MEQ tablet Commonly known as: KLOR-CON Take 20 mEq by mouth daily.   predniSONE 1 MG tablet Commonly known as: DELTASONE Take 4 mg by mouth daily with breakfast.   Rewetting Drops Soln 1 drop by Does not apply route every 6 (six) hours as needed.   vitamin C 500 MG tablet Commonly known as: ASCORBIC ACID Take 500 mg by mouth daily.   Xarelto 15 MG Tabs tablet Generic drug: Rivaroxaban Take 15 mg by mouth daily with supper.      ROS was provided with assistance of staff.  Review of Systems  Constitutional: Negative for activity change,  appetite change, chills, diaphoresis, fatigue, fever and unexpected weight change.  HENT: Positive for hearing loss. Negative for congestion and voice change.   Eyes: Negative for visual disturbance.  Respiratory: Positive for shortness of breath. Negative for cough and wheezing.        DOE  Cardiovascular: Positive for leg swelling. Negative for chest pain.  Gastrointestinal: Negative for abdominal distention, abdominal pain, constipation, diarrhea, nausea and vomiting.  Genitourinary: Negative for difficulty urinating, dysuria and urgency.  Musculoskeletal: Positive for arthralgias, gait problem and myalgias.  Skin: Positive for color change.  Neurological: Negative for dizziness, speech difficulty, weakness and headaches.       Memory lapses.   Psychiatric/Behavioral: Negative  for agitation, behavioral problems, hallucinations and sleep disturbance. The patient is not nervous/anxious.     Immunization History  Administered Date(s) Administered  . Influenza Whole 09/14/2018  . Influenza-Unspecified 09/13/2015, 09/29/2016, 10/03/2017  . Pneumococcal Conjugate-13 10/03/2017  . Pneumococcal-Unspecified 05/14/2015  . Tdap 04/13/2011   Pertinent  Health Maintenance Due  Topic Date Due  . INFLUENZA VACCINE  07/14/2019  . DEXA SCAN  Completed  . PNA vac Low Risk Adult  Completed   Fall Risk  09/01/2018 08/23/2017 08/12/2016  Falls in the past year? No No No   Functional Status Survey:    Vitals:   09/19/19 0905  BP: 140/70  Pulse: 66  Resp: 18  Temp: 98.3 F (36.8 C)  SpO2: 94%  Weight: 160 lb 6.4 oz (72.8 kg)  Height: 5' (1.524 m)   Body mass index is 31.33 kg/m. Physical Exam Vitals signs and nursing note reviewed.  Constitutional:      General: She is not in acute distress.    Appearance: Normal appearance. She is obese. She is not ill-appearing, toxic-appearing or diaphoretic.  HENT:     Head: Normocephalic and atraumatic.     Nose: Nose normal.     Mouth/Throat:      Mouth: Mucous membranes are moist.  Eyes:     Extraocular Movements: Extraocular movements intact.     Conjunctiva/sclera: Conjunctivae normal.     Pupils: Pupils are equal, round, and reactive to light.  Neck:     Musculoskeletal: Normal range of motion.  Cardiovascular:     Rate and Rhythm: Normal rate. Rhythm irregular.     Heart sounds: No murmur.  Pulmonary:     Breath sounds: Rales present. No wheezing or rhonchi.     Comments: Bibasilar rales.  Abdominal:     General: Bowel sounds are normal.     Palpations: Abdomen is soft.     Tenderness: There is no abdominal tenderness. There is no right CVA tenderness, left CVA tenderness, guarding or rebound.  Musculoskeletal:     Right lower leg: Edema present.     Left lower leg: Edema present.     Comments: Trace edema BLE. Limited ROM R+L shoulders.   Skin:    General: Skin is warm and dry.     Comments: Chronic venous insufficiency skin changes BLE  Neurological:     General: No focal deficit present.     Mental Status: She is alert. Mental status is at baseline.     Cranial Nerves: No cranial nerve deficit.     Motor: No weakness.     Coordination: Coordination normal.     Gait: Gait abnormal.     Comments: Oriented to person, place.   Psychiatric:        Mood and Affect: Mood normal.        Behavior: Behavior normal.        Thought Content: Thought content normal.        Judgment: Judgment normal.     Labs reviewed: Recent Labs    03/06/19 05/24/19  NA  --  140  K  --  4.2  CL  --  103  CO2  --  29  BUN 23* 29*  CREATININE 0.8 0.8  CALCIUM  --  9.3   Recent Labs    03/06/19  AST 21  ALT 14   Recent Labs    03/06/19 05/24/19  WBC  --  5.3  HGB 13.8 12.2  HCT 40 36  PLT  164 154   Lab Results  Component Value Date   TSH 1.82 03/07/2018   Lab Results  Component Value Date   HGBA1C 5.4 05/17/2017   Lab Results  Component Value Date   CHOL 185 03/07/2018   HDL 73 (A) 03/07/2018   LDLCALC 86  03/07/2018   TRIG 158 03/07/2018   CHOLHDL 2.4 05/04/2016    Significant Diagnostic Results in last 30 days:  No results found.  Assessment/Plan Essential hypertension Blood pressure is controlled, continue Enalapril 20mg  bid, Metoprolol 150mg  qd, Diltiazem 120mg  qd.   Chronic diastolic CHF (congestive heart failure), NYHA class 3 (HCC) Compensated clinically, trace edema BLE remains no change, continue Furosemide 40mg  qd.   Atrial fibrillation (HCC) Heart rate is in control, continue Metoprolol 150mg  qd, Diltiazem 120mg  qd, Xarelto 15mg  qd.   Rheumatoid arthritis (HCC) Multiple sites, stable, continue Prednisone 4mg  qd, Tylenol 1000mg  bid.   Hyperlipidemia Update lipid panel.      Family/ staff Communication: plan of care reviewed with the patient and charge nurse.   Labs/tests ordered:  Lipid panel. DEXA order sent  Time spend 40 minutes.

## 2019-09-25 DIAGNOSIS — I5032 Chronic diastolic (congestive) heart failure: Secondary | ICD-10-CM | POA: Diagnosis not present

## 2019-09-25 LAB — LIPID PANEL
Cholesterol: 154 (ref 0–200)
HDL: 59 (ref 35–70)
LDL Cholesterol: 72
LDl/HDL Ratio: 2.6

## 2019-10-16 DIAGNOSIS — Z20828 Contact with and (suspected) exposure to other viral communicable diseases: Secondary | ICD-10-CM | POA: Diagnosis not present

## 2019-10-17 NOTE — Progress Notes (Signed)
Virtual Visit via Telephone Note   This visit type was conducted due to national recommendations for restrictions regarding the COVID-19 Pandemic (e.g. social distancing) in an effort to limit this patient's exposure and mitigate transmission in our community.  Due to her co-morbid illnesses, this patient is at least at moderate risk for complications without adequate follow up.  This format is felt to be most appropriate for this patient at this time.  The patient did not have access to video technology/had technical difficulties with video requiring transitioning to audio format only (telephone).  All issues noted in this document were discussed and addressed.  No physical exam could be performed with this format.  Please refer to the patient's chart for her  consent to telehealth for [facility].   Date:  10/19/2019   ID:  [name], DOB Nov 22, 1928, MRN [id number]  Patient Location: Home Provider Location: Home  PCP:  Virgie Dad, MD  Cardiologist:  Eliam Snapp Martinique MD Electrophysiologist:  None   Evaluation Performed:  Follow-Up Visit  Chief Complaint:  Afib   History of Present Illness:    [name] is a 85 y.o. female with history of Afib. Afib.  Her daughter is a former Therapist, sports on the renal unit. She presented in  May 2017. She started to complain of dyspnea which was fairly rapid in onset. She presented to the ED and was noted to be in AF and CHF. She was placed on Xarelto and diuresed. Her beta blocker was adjusted. She underwent OP DCCV 07/08/16 but failed to hold NSR and was back in AF with CVR by the end of August. She was seen in the fall 2017 with complaints of new LE edema with weeping areas.  Her lasix dose was increased to 40 mg daily with improvement. Echo showed normal LV function, biatrial enlargement, pulmonary HTN. She lives at [facility].   On follow up today she states she is doing well. Her activity is  limited. She only walks only a little but is able  to get around quite well in her wheel chair with the use of her legs.  Weight has been stable. No increased edema. Wears support hose. She denies any chest pain or palpitations.    The patient does not have symptoms concerning for COVID-19 infection (fever, chills, cough, or new shortness of breath).    Past Medical History:  Diagnosis Date  . Cervicalgia 08/12/2016  . CHF (congestive heart failure) ([place])   . Chronic diastolic CHF (congestive heart failure), NYHA class 3 ([place]) 06/17/2016  . Complication of anesthesia   . Edema 08/12/2016  . Hypertension   . New onset atrial fibrillation ([place]) 04/2016   a. started on Xarelto and Cardizem  . PONV (postoperative nausea and vomiting)   . RA (rheumatoid arthritis) ([place])   . SCC (squamous cell carcinoma) KA 09/15/2016   Left forearm - tx p bx  . SOB (shortness of breath) 04/2016   Hospitalist  . Vertigo    Past Surgical History:  Procedure Laterality Date  . Biopsy right breast Right    fibrocystic changes  . CARDIOVERSION N/A 07/08/2016   Procedure: CARDIOVERSION;  Surgeon: Larey Dresser, MD;  Location: [facility];  Service: Cardiovascular;  Laterality: N/A;  . CERVICAL SPINE SURGERY  2005   Dr. Vertell Limber  . HYSTEROSCOPY  2001  . JOINT REPLACEMENT    . REPLACEMENT TOTAL KNEE Right 1996   [place] MD  . SHOULDER SURGERY Bilateral [phone]  Baird Lyons MD     Current Meds  Medication Sig  . acetaminophen (TYLENOL) 500 MG tablet Take 1,000 mg by mouth 2 (two) times daily.   Marland Kitchen amoxicillin (AMOXIL) 500 MG tablet Take 2,000 mg by mouth as needed. Take 1 hour prior to dental appointment.   . Calcium Carbonate (CALCIUM 600 PO) Take 1and 1/2  Tablet = 900 mg in the evening with dinner .  . calcium carbonate (OSCAL) 1500 (600 Ca) MG TABS tablet Take 1,500 mg by mouth daily. OTC  . dextromethorphan-guaiFENesin (MUCINEX DM) 30-600 MG 12hr tablet Take 1 tablet by mouth 2 (two) times daily as needed for cough.  . diltiazem  (CARDIZEM CD) 120 MG 24 hr capsule Take 1 capsule (120 mg total) by mouth daily.  . enalapril (VASOTEC) 20 MG tablet Take 20 mg by mouth 2 (two) times daily.    . furosemide (LASIX) 40 MG tablet Take 40 mg by mouth daily.   . Lidocaine (PAIN RELIEF MAXIMUM STRENGTH) 4 % PTCH Apply 1 patch topically every 12 (twelve) hours as needed. Apply patch to right side of neck on for 12 hours and off 12 hours at night.  . loratadine (CLARITIN) 10 MG tablet Take 10 mg by mouth daily.  . Magnesium Oxide 250 MG TABS Take 250 mg by mouth daily.  . Menthol (BIOFREEZE) 10 % AERO Apply topically 3 (three) times daily as needed. Apply cream to right side of neck.  . Metoprolol Succinate 100 MG CS24 Take 150 mg by mouth at bedtime.  . OXYGEN Inhale 2 L into the lungs daily as needed (shortness of breath). Use 2 liter as needed for SOB, 02 sat < 90% on room air   . potassium chloride SA (K-DUR,KLOR-CON) 20 MEQ tablet Take 20 mEq by mouth daily.   . predniSONE (DELTASONE) 1 MG tablet Take 4 mg by mouth daily with breakfast.  . Rivaroxaban (XARELTO) 15 MG TABS tablet Take 15 mg by mouth daily with supper.  . Soft Lens Products (REWETTING DROPS) SOLN 1 drop by Does not apply route every 6 (six) hours as needed.  . vitamin C (ASCORBIC ACID) 500 MG tablet Take 500 mg by mouth daily.    . Wheat Dextrin (BENEFIBER PO) Take 4 scoop by mouth daily.      Allergies:   Morphine sulfate and Other   Social History   Tobacco Use  . Smoking status: Former Smoker    Quit date: 12/13/1966    Years since quitting: 52.8  . Smokeless tobacco: Never Used  Substance Use Topics  . Alcohol use: No  . Drug use: No     Family Hx: The patient's family history includes Arthritis in her daughter and son; Cancer in her sister; Hypertension in her son; Hypothyroidism in her daughter.  ROS:   Please see the history of present illness.    All other systems reviewed and are negative.   Prior CV studies:   The following studies were  reviewed today:  Echo: 10/08/16:  Study Conclusions  - Left ventricle: The cavity size was normal. Systolic function was normal. The estimated ejection fraction was in the range of 60% to 65%. Wall motion was normal; there were no regional wall motion abnormalities. - Aortic valve: There was trivial regurgitation. - Mitral valve: Calcified annulus. Mildly thickened leaflets . There was mild regurgitation. - Left atrium: The atrium was moderately dilated. - Right atrium: The atrium was moderately dilated. - Tricuspid valve: There was mild-moderate regurgitation. There was  evidence of perivalvular regurgitation. - Pulmonary arteries: Systolic pressure was moderately increased. PA peak pressure: 54 mm Hg (S).  Labs/Other Tests and Data Reviewed:    EKG:  No ECG reviewed.  Recent Labs: 03/06/2019: ALT 14 05/24/2019: BUN 29; Creatinine 0.8; Hemoglobin 12.2; Platelets 154; Potassium 4.2; Sodium 140   Recent Lipid Panel Lab Results  Component Value Date/Time   CHOL 185 03/07/2018   TRIG 158 03/07/2018   HDL 73 (A) 03/07/2018   CHOLHDL 2.4 05/04/2016 03:03 AM   LDLCALC 86 03/07/2018    Wt Readings from Last 3 Encounters:  10/19/19 160 lb 4 oz (72.7 kg)  09/19/19 160 lb 6.4 oz (72.8 kg)  08/22/19 159 lb 9.6 oz (72.4 kg)     Objective:    Vital Signs:  BP 128/78   Pulse 65   Temp 98.5 F (36.9 C)   Resp 18   Wt 160 lb 4 oz (72.7 kg)   BMI 31.30 kg/m    VITAL SIGNS:  reviewed  ASSESSMENT & PLAN:    1. Atrial fibrillation-  permanent. Rate is well controlled. She is asymptomatic. On Xarelto for anticoagulation. Continue metoprolol and diltiazem for rate control 2. Chronic diastolic CHF. She is well compensated on lasix 40 mg dialy.  Will continue current therapy. Continue sodium restriction and support hose.   3. RA 4. HTN- controlled.  COVID-19 Education: The signs and symptoms of COVID-19 were discussed with the patient and how to seek care for testing  (follow up with PCP or arrange E-visit).  The importance of social distancing was discussed today.  Time:   Today, I have spent 7 minutes with the patient with telehealth technology discussing the above problems.     Medication Adjustments/Labs and Tests Ordered: Current medicines are reviewed at length with the patient today.  Concerns regarding medicines are outlined above.   Tests Ordered: No orders of the defined types were placed in this encounter.   Medication Changes: No orders of the defined types were placed in this encounter.   Follow Up:  In Person in 1 year(s)  Signed, Kalicia Dufresne Martinique, MD  10/19/2019 9:50 AM    [place]

## 2019-10-19 ENCOUNTER — Telehealth (INDEPENDENT_AMBULATORY_CARE_PROVIDER_SITE_OTHER): Payer: PPO | Admitting: Cardiology

## 2019-10-19 ENCOUNTER — Encounter: Payer: Self-pay | Admitting: Cardiology

## 2019-10-19 VITALS — BP 128/78 | HR 65 | Temp 98.5°F | Resp 18 | Wt 160.2 lb

## 2019-10-19 DIAGNOSIS — I5032 Chronic diastolic (congestive) heart failure: Secondary | ICD-10-CM

## 2019-10-19 DIAGNOSIS — I4819 Other persistent atrial fibrillation: Secondary | ICD-10-CM

## 2019-10-19 DIAGNOSIS — I10 Essential (primary) hypertension: Secondary | ICD-10-CM

## 2019-10-19 NOTE — Patient Instructions (Signed)
Medication Instructions:  Continue same medications   Lab Work: None ordered   Testing/Procedures: None ordered  Follow-Up: At [facility], you and your health needs are our priority.  As part of our continuing mission to provide you with exceptional heart care, we have created designated Provider Care Teams.  These Care Teams include your primary Cardiologist (physician) and Advanced Practice Providers (APPs -  Physician Assistants and Nurse Practitioners) who all work together to provide you with the care you need, when you need it.  Your next appointment:  1 year   The format for your next appointment:  Office   Provider:  Dr.Jordan

## 2019-10-23 DIAGNOSIS — Z20828 Contact with and (suspected) exposure to other viral communicable diseases: Secondary | ICD-10-CM | POA: Diagnosis not present

## 2019-10-24 ENCOUNTER — Ambulatory Visit: Payer: PPO | Admitting: Cardiology

## 2019-10-30 DIAGNOSIS — Z20828 Contact with and (suspected) exposure to other viral communicable diseases: Secondary | ICD-10-CM | POA: Diagnosis not present

## 2019-11-27 DIAGNOSIS — Z20828 Contact with and (suspected) exposure to other viral communicable diseases: Secondary | ICD-10-CM | POA: Diagnosis not present

## 2019-12-04 DIAGNOSIS — Z20828 Contact with and (suspected) exposure to other viral communicable diseases: Secondary | ICD-10-CM | POA: Diagnosis not present

## 2019-12-26 DIAGNOSIS — Z20828 Contact with and (suspected) exposure to other viral communicable diseases: Secondary | ICD-10-CM | POA: Diagnosis not present

## 2020-01-02 DIAGNOSIS — Z20828 Contact with and (suspected) exposure to other viral communicable diseases: Secondary | ICD-10-CM | POA: Diagnosis not present

## 2020-01-09 DIAGNOSIS — Z20828 Contact with and (suspected) exposure to other viral communicable diseases: Secondary | ICD-10-CM | POA: Diagnosis not present

## 2020-01-16 DIAGNOSIS — Z20828 Contact with and (suspected) exposure to other viral communicable diseases: Secondary | ICD-10-CM | POA: Diagnosis not present

## 2020-01-23 DIAGNOSIS — Z20828 Contact with and (suspected) exposure to other viral communicable diseases: Secondary | ICD-10-CM | POA: Diagnosis not present

## 2020-01-30 DIAGNOSIS — Z20828 Contact with and (suspected) exposure to other viral communicable diseases: Secondary | ICD-10-CM | POA: Diagnosis not present

## 2020-02-12 ENCOUNTER — Non-Acute Institutional Stay: Payer: PPO | Admitting: Internal Medicine

## 2020-02-12 ENCOUNTER — Encounter: Payer: Self-pay | Admitting: Internal Medicine

## 2020-02-12 DIAGNOSIS — I4811 Longstanding persistent atrial fibrillation: Secondary | ICD-10-CM

## 2020-02-12 DIAGNOSIS — E785 Hyperlipidemia, unspecified: Secondary | ICD-10-CM | POA: Diagnosis not present

## 2020-02-12 DIAGNOSIS — I5032 Chronic diastolic (congestive) heart failure: Secondary | ICD-10-CM | POA: Diagnosis not present

## 2020-02-12 DIAGNOSIS — M0579 Rheumatoid arthritis with rheumatoid factor of multiple sites without organ or systems involvement: Secondary | ICD-10-CM | POA: Diagnosis not present

## 2020-02-12 DIAGNOSIS — I10 Essential (primary) hypertension: Secondary | ICD-10-CM | POA: Diagnosis not present

## 2020-02-12 DIAGNOSIS — R600 Localized edema: Secondary | ICD-10-CM

## 2020-02-12 NOTE — Progress Notes (Signed)
Location:   [facility] Room Number: [place] of Service:  ALF [phone]) Provider: Virgie Dad, MD   Virgie Dad, MD  Patient Care Team: Virgie Dad, MD as PCP - General (Internal Medicine) Mast, Man X, NP as Nurse Practitioner (Internal Medicine) Martinique, Peter M, MD as Consulting Physician (Cardiology) Lavonna Monarch, MD as Consulting Physician (Dermatology) Garald Balding, MD as Consulting Physician (Orthopedic Surgery)  Extended Emergency Contact Information Primary Emergency Contact: Berneta Levins Address: [address] [place], [place] [address] Johnnette Litter of [place] Phone: [phone] Work Phone: [phone] Mobile Phone: [phone] Relation: Daughter Secondary Emergency Contact: Azalee Course States of [place] Phone: [phone] Mobile Phone: [phone] Relation: Son  Code Status:  DNR Goals of care: Advanced Directive information Advanced Directives 02/12/2020  Does Patient Have a Medical Advance Directive? Yes  Type of Paramedic of [place];Living will;Out of facility DNR (pink MOST or yellow form)  Does patient want to make changes to medical advance directive? No - Patient declined  Copy of [place] in Chart? Yes - validated most recent copy scanned in chart (See row information)  Would patient like information on creating a medical advance directive? -  Pre-existing out of facility DNR order (yellow form or pink MOST form) Yellow form placed in chart (order not valid for inpatient use)     Chief Complaint  Patient presents with  . Medical Management of Chronic Issues    HPI:  Pt is a 86 y.o. female seen today for medical management of chronic diseases.    Patient has h/o Chronic Atrial Fibrillation on Xarelto, Hypertension, Diastolic CHF, Rheumatoid arthritis on Chronic Prednisone  Lives in [place] No Nursing issues. Weight stable Her only  complain is Shoulder Pain Left more then right. She has seen Dr Durward Fortes and have been told nothing more can be done. Her Pain is controlled on Tylenol She is mostly Wheelchair dependent. Walks some distance with walker. Independent in her ADLS Past Medical History:  Diagnosis Date  . Cervicalgia 08/12/2016  . CHF (congestive heart failure) ([place])   . Chronic diastolic CHF (congestive heart failure), NYHA class 3 ([place]) 06/17/2016  . Complication of anesthesia   . Edema 08/12/2016  . Hypertension   . New onset atrial fibrillation ([place]) 04/2016   a. started on Xarelto and Cardizem  . PONV (postoperative nausea and vomiting)   . RA (rheumatoid arthritis) ([place])   . SCC (squamous cell carcinoma) KA 09/15/2016   Left forearm - tx p bx  . SOB (shortness of breath) 04/2016   Hospitalist  . Vertigo    Past Surgical History:  Procedure Laterality Date  . Biopsy right breast Right    fibrocystic changes  . CARDIOVERSION N/A 07/08/2016   Procedure: CARDIOVERSION;  Surgeon: Larey Dresser, MD;  Location: [facility];  Service: Cardiovascular;  Laterality: N/A;  . CERVICAL SPINE SURGERY  2005   Dr. Vertell Limber  . HYSTEROSCOPY  2001  . JOINT REPLACEMENT    . REPLACEMENT TOTAL KNEE Right 1996   [place] Bilateral [id number]   Baird Lyons MD    Allergies  Allergen Reactions  . Morphine Sulfate Other (See Comments)    "extremely nauseated"  . Other Nausea And Vomiting    Pt states she is very sensitive to pain medications.    Allergies as of 02/12/2020  Reactions   Morphine Sulfate Other (See Comments)   "extremely nauseated"   Other Nausea And Vomiting   Pt states she is very sensitive to pain medications.      Medication List       Accurate as of February 12, 2020 10:57 AM. If you have any questions, ask your nurse or doctor.        acetaminophen 500 MG tablet Commonly known as: TYLENOL Take 1,000 mg by mouth 2 (two) times daily.   amoxicillin  500 MG tablet Commonly known as: AMOXIL Take 2,000 mg by mouth as needed. Take 1 hour prior to dental appointment.   BENEFIBER PO Take 4 scoop by mouth daily.   Biofreeze 10 % Aero Generic drug: Menthol Apply topically 3 (three) times daily as needed. Apply cream to right side of neck.   CALCIUM 600 PO Take 1and 1/2  Tablet = 900 mg in the evening with dinner .   calcium carbonate 1500 (600 Ca) MG Tabs tablet Commonly known as: OSCAL Take 1,500 mg by mouth daily. OTC   Claritin 10 MG tablet Generic drug: loratadine Take 10 mg by mouth daily.   dextromethorphan-guaiFENesin 30-600 MG 12hr tablet Commonly known as: MUCINEX DM Take 1 tablet by mouth 2 (two) times daily as needed for cough.   diltiazem 120 MG 24 hr capsule Commonly known as: CARDIZEM CD Take 1 capsule (120 mg total) by mouth daily.   enalapril 20 MG tablet Commonly known as: VASOTEC Take 20 mg by mouth 2 (two) times daily.   furosemide 40 MG tablet Commonly known as: LASIX Take 40 mg by mouth daily.   Magnesium Oxide 250 MG Tabs Take 250 mg by mouth daily.   metoprolol succinate 50 MG 24 hr tablet Commonly known as: TOPROL-XL Take 50 mg by mouth. Give with Metoprolol 100mg  Once A Day   Metoprolol Succinate 100 MG Cs24 Take 150 mg by mouth at bedtime.   OXYGEN Inhale 2 L into the lungs daily as needed (shortness of breath). Use 2 liter as needed for SOB, 02 sat < 90% on room air   Pain Relief Maximum Strength 4 % Ptch Generic drug: Lidocaine Apply 1 patch topically every 12 (twelve) hours as needed. Apply patch to right side of neck on for 12 hours and off 12 hours at night.   potassium chloride SA 20 MEQ tablet Commonly known as: KLOR-CON Take 20 mEq by mouth daily.   predniSONE 1 MG tablet Commonly known as: DELTASONE Take 4 mg by mouth daily with breakfast.   Rewetting Drops Soln 1 drop by Does not apply route every 6 (six) hours as needed.   vitamin C 500 MG tablet Commonly known as:  ASCORBIC ACID Take 500 mg by mouth daily.   Xarelto 15 MG Tabs tablet Generic drug: Rivaroxaban Take 15 mg by mouth daily with supper.       Review of Systems  Constitutional: Negative.   HENT: Negative.   Respiratory: Negative.   Cardiovascular: Positive for leg swelling.  Gastrointestinal: Negative.   Genitourinary: Negative.   Musculoskeletal: Positive for arthralgias and gait problem.  Skin: Negative.   Neurological: Negative for dizziness and weakness.  Psychiatric/Behavioral: Negative.     Immunization History  Administered Date(s) Administered  . Influenza Whole 09/14/2018  . Influenza, High Dose Seasonal PF 09/15/2019  . Influenza-Unspecified 09/13/2015, 09/29/2016, 10/03/2017  . Moderna SARS-COVID-2 Vaccination 12/15/2019, 01/12/2020  . Pneumococcal Conjugate-13 10/03/2017  . Pneumococcal-Unspecified 05/14/2015  . Tdap 04/13/2011   Pertinent  Health Maintenance Due  Topic Date Due  . INFLUENZA VACCINE  Completed  . DEXA SCAN  Completed  . PNA vac Low Risk Adult  Completed   Fall Risk  09/01/2018 08/23/2017 08/12/2016  Falls in the past year? No No No   Functional Status Survey:    Vitals:   02/12/20 1044  BP: (!) 144/70  Pulse: 70  Resp: 18  Temp: (!) 97.1 F (36.2 C)  SpO2: 95%  Weight: 161 lb 6.4 oz (73.2 kg)  Height: 5' (1.524 m)   Body mass index is 31.52 kg/m. Physical Exam Vitals reviewed.  Constitutional:      Appearance: Normal appearance.  HENT:     Head: Normocephalic.     Nose: Nose normal.     Mouth/Throat:     Mouth: Mucous membranes are moist.     Pharynx: Oropharynx is clear.  Eyes:     Pupils: Pupils are equal, round, and reactive to light.  Cardiovascular:     Rate and Rhythm: Normal rate. Rhythm irregular.     Pulses: Normal pulses.  Pulmonary:     Effort: Pulmonary effort is normal.     Breath sounds: Normal breath sounds.  Abdominal:     General: Abdomen is flat. Bowel sounds are normal.     Palpations: Abdomen is  soft.  Musculoskeletal:        General: Swelling present.     Cervical back: Neck supple.     Comments: Cannot raise her Hands above Shoulder  Skin:    General: Skin is warm.  Neurological:     General: No focal deficit present.     Mental Status: She is alert and oriented to person, place, and time.  Psychiatric:        Mood and Affect: Mood normal.        Thought Content: Thought content normal.     Labs reviewed: Recent Labs    03/06/19 0000 05/24/19 0000  NA  --  140  K  --  4.2  CL  --  103  CO2  --  29  BUN 23* 29*  CREATININE 0.8 0.8  CALCIUM  --  9.3   Recent Labs    03/06/19 0000  AST 21  ALT 14   Recent Labs    03/06/19 0000 05/24/19 0000  WBC  --  5.3  HGB 13.8 12.2  HCT 40 36  PLT 164 154   Lab Results  Component Value Date   TSH 1.82 03/07/2018   Lab Results  Component Value Date   HGBA1C 5.4 05/17/2017   Lab Results  Component Value Date   CHOL 154 09/25/2019   HDL 59 09/25/2019   LDLCALC 72 09/25/2019   TRIG 158 03/07/2018   CHOLHDL 2.4 05/04/2016    Significant Diagnostic Results in last 30 days:  No results found.  Assessment/Plan  Essential hypertension - Plan:  Doing well On Vasotec and Cardizem  Chronic diastolic CHF (congestive heart failure) EF was 65% in 2017 On Lasix Weight stable Repeat BMP Paroxysmal atrial fibrillation ([place]) - Plan:  Doing well on Cardizem, Lopressor  and Xarelto  Rheumatoid arthritis  On Chronic Prednisone D/W her if she would like to follow with Rheumatology Not intrested right now Depression with anxiety -  Mood Stable  Not on any meds now  Hyperlipidemia,  On Crestor Bilateral Shoulder Arthritis Pain stable on Tyelnol  Family/ staff Communication:   Labs/tests ordered:  CBC,CMP,Lipid Panel DEXA Scan when available in  Facility    Total time spent in this patient care encounter was  _45  minutes; greater than 50% of the visit spent counseling patient and staff, reviewing  records , Labs and coordinating care for problems addressed at this encounter.

## 2020-02-14 DIAGNOSIS — N390 Urinary tract infection, site not specified: Secondary | ICD-10-CM | POA: Diagnosis not present

## 2020-02-14 DIAGNOSIS — R58 Hemorrhage, not elsewhere classified: Secondary | ICD-10-CM | POA: Diagnosis not present

## 2020-02-14 DIAGNOSIS — I5032 Chronic diastolic (congestive) heart failure: Secondary | ICD-10-CM | POA: Diagnosis not present

## 2020-02-14 LAB — LIPID PANEL
Cholesterol: 148 (ref 0–200)
HDL: 57 (ref 35–70)
LDL Cholesterol: 71
LDl/HDL Ratio: 2.6
Triglycerides: 121 (ref 40–160)

## 2020-02-14 LAB — BASIC METABOLIC PANEL
BUN: 22 — AB (ref 4–21)
CO2: 31 — AB (ref 13–22)
Chloride: 103 (ref 99–108)
Creatinine: 0.8 (ref 0.5–1.1)
Glucose: 95
Potassium: 3.7 (ref 3.4–5.3)
Sodium: 141 (ref 137–147)

## 2020-02-14 LAB — CBC AND DIFFERENTIAL
HCT: 38 (ref 36–46)
Hemoglobin: 13.1 (ref 12.0–16.0)
Neutrophils Absolute: 3173
Platelets: 132 — AB (ref 150–399)
WBC: 4.7

## 2020-02-14 LAB — HEPATIC FUNCTION PANEL
ALT: 14 (ref 7–35)
AST: 22 (ref 13–35)
Alkaline Phosphatase: 45 (ref 25–125)
Bilirubin, Total: 0.9

## 2020-02-14 LAB — CBC: RBC: 3.98 (ref 3.87–5.11)

## 2020-02-14 LAB — COMPREHENSIVE METABOLIC PANEL
Albumin: 3.9 (ref 3.5–5.0)
Calcium: 9.7 (ref 8.7–10.7)
Globulin: 1.7

## 2020-05-19 ENCOUNTER — Non-Acute Institutional Stay: Payer: PPO | Admitting: Nurse Practitioner

## 2020-05-19 ENCOUNTER — Encounter: Payer: Self-pay | Admitting: Nurse Practitioner

## 2020-05-19 DIAGNOSIS — I4811 Longstanding persistent atrial fibrillation: Secondary | ICD-10-CM | POA: Diagnosis not present

## 2020-05-19 DIAGNOSIS — I5032 Chronic diastolic (congestive) heart failure: Secondary | ICD-10-CM

## 2020-05-19 DIAGNOSIS — M0579 Rheumatoid arthritis with rheumatoid factor of multiple sites without organ or systems involvement: Secondary | ICD-10-CM

## 2020-05-19 DIAGNOSIS — I10 Essential (primary) hypertension: Secondary | ICD-10-CM | POA: Diagnosis not present

## 2020-05-19 NOTE — Progress Notes (Signed)
Location:   [facility] Room Number: 161 Place of Service:  ALF [phone]) Provider:  Mykell Rawl, Lennie Odor NP  Virgie Dad, MD  Patient Care Team: Virgie Dad, MD as PCP - General (Internal Medicine) Embrie Mikkelsen X, NP as Nurse Practitioner (Internal Medicine) Martinique, Peter M, MD as Consulting Physician (Cardiology) Lavonna Monarch, MD as Consulting Physician (Dermatology) Garald Balding, MD as Consulting Physician (Orthopedic Surgery)  Extended Emergency Contact Information Primary Emergency Contact: Berneta Levins Address: [address] [place], [place] [address] Johnnette Litter of [place] Phone: [phone] Work Phone: [phone] Mobile Phone: [phone] Relation: Daughter Secondary Emergency Contact: Azalee Course [place] of [place] Phone: [phone] Mobile Phone: [phone] Relation: Son  Code Status:  DNR Goals of care: Advanced Directive information Advanced Directives 05/19/2020  Does Patient Have a Medical Advance Directive? Yes  Type of Paramedic of [place];Living will;Out of facility DNR (pink MOST or yellow form)  Does patient want to make changes to medical advance directive? No - Patient declined  Copy of [place] in Chart? Yes - validated most recent copy scanned in chart (See row information)  Would patient like information on creating a medical advance directive? -  Pre-existing out of facility DNR order (yellow form or pink MOST form) Yellow form placed in chart (order not valid for inpatient use)     Chief Complaint  Patient presents with  . Medical Management of Chronic Issues    HPI:  Pt is a 86 y.o. female seen today for medical management of her chronic medical issues.    The patient has Hx of CHF/edema BLE, on Furosemide 17m qd. Afib, heart rate is in control, on Diltiazem 1223mqd, Metoprolol 15022md,  Xarelto 66m56m. HTN, blood pressure is  controlled, on Enalapril 20mg24m along with Metoprolol and Diltiazem. RA, pain, multiples sites, on Prednisone 4mg q24mTylenol 1000mg b66m500mg qd63m   Past Medical History:  Diagnosis Date  . Cervicalgia 08/12/2016  . CHF (congestive heart failure) ([facility])   .[place] diastolic CHF (congestive heart failure), NYHA class 3 (H[facility]) 7/6[place]17  . Complication of anesthesia   . Edema 08/12/2016  . Hypertension   . New onset atrial fibrillation ([facility]) 05/[place]7   a. started on Xarelto and Cardizem  . PONV (postoperative nausea and vomiting)   . RA (rheumatoid arthritis) (H[facility])   .[place] (squamous cell carcinoma) KA 09/15/2016   Left forearm - tx p bx  . SOB (shortness of breath) 04/2016   Hospitalist  . Vertigo    Past Surgical History:  Procedure Laterality Date  . Biopsy right breast Right    fibrocystic changes  . CARDIOVERSION N/A 07/08/2016   Procedure: CARDIOVERSION;  Surgeon: Dalton SLarey Dresserocation: [facility] ENDOSSunrisece: Cardiovascular;  Laterality: N/A;  . CERVICAL SPINE SURGERY  2005   Dr. Stern  .Vertell LimberEROSCOPY  2001  . JOINT REPLACEMENT    . REPLACEMENT TOTAL KNEE Right 1996   [facility] [place] 2005,[id number] Lyonsllergies  Allergen Reactions  . Morphine Sulfate Other (See Comments)    "extremely nauseated"  . Other Nausea And Vomiting    Pt states she is very sensitive to pain medications.    Allergies as of 05/19/2020      Reactions   Morphine Sulfate Other (See Comments)   "  extremely nauseated"   Other Nausea And Vomiting   Pt states she is very sensitive to pain medications.      Medication List       Accurate as of May 19, 2020 11:59 PM. If you have any questions, ask your nurse or doctor.        acetaminophen 500 MG tablet Commonly known as: TYLENOL Take 1,000 mg by mouth 2 (two) times daily.   acetaminophen 500 MG tablet Commonly known as: TYLENOL Take 500 mg by mouth at bedtime.     amoxicillin 500 MG tablet Commonly known as: AMOXIL Take 2,000 mg by mouth as needed. Take 1 hour prior to dental appointment.   BENEFIBER PO Take 4 scoop by mouth daily.   Biofreeze 10 % Aero Generic drug: Menthol Apply topically 3 (three) times daily as needed. Apply cream to right side of neck.   CALCIUM 600 PO Take 1and 1/2  Tablet = 900 mg in the evening with dinner .   calcium carbonate 1500 (600 Ca) MG Tabs tablet Commonly known as: OSCAL Take 1,500 mg by mouth daily. OTC   Claritin 10 MG tablet Generic drug: loratadine Take 10 mg by mouth daily.   dextromethorphan-guaiFENesin 30-600 MG 12hr tablet Commonly known as: MUCINEX DM Take 1 tablet by mouth 2 (two) times daily as needed for cough.   diltiazem 120 MG 24 hr capsule Commonly known as: CARDIZEM CD Take 1 capsule (120 mg total) by mouth daily.   enalapril 20 MG tablet Commonly known as: VASOTEC Take 20 mg by mouth 2 (two) times daily.   furosemide 40 MG tablet Commonly known as: LASIX Take 40 mg by mouth daily.   Magnesium Oxide 250 MG Tabs Take 250 mg by mouth daily.   metoprolol succinate 50 MG 24 hr tablet Commonly known as: TOPROL-XL Take 50 mg by mouth. Give with Metoprolol 134m Once A Day   Metoprolol Succinate 100 MG Cs24 Take 150 mg by mouth at bedtime.   OXYGEN Inhale 2 L into the lungs daily as needed (shortness of breath). Use 2 liter as needed for SOB, 02 sat < 90% on room air   Pain Relief Maximum Strength 4 % Ptch Generic drug: Lidocaine Apply 1 patch topically every 12 (twelve) hours as needed. Apply patch to right side of neck on for 12 hours and off 12 hours at night.   potassium chloride SA 20 MEQ tablet Commonly known as: KLOR-CON Take 20 mEq by mouth daily.   predniSONE 1 MG tablet Commonly known as: DELTASONE Take 4 mg by mouth daily with breakfast.   Rewetting Drops Soln 1 drop by Does not apply route every 6 (six) hours as needed.   vitamin C 500 MG  tablet Commonly known as: ASCORBIC ACID Take 500 mg by mouth daily.   Xarelto 15 MG Tabs tablet Generic drug: Rivaroxaban Take 15 mg by mouth daily with supper.       Review of Systems  Constitutional: Negative for activity change, fever and unexpected weight change.  HENT: Positive for hearing loss. Negative for congestion and voice change.   Eyes: Negative for visual disturbance.  Respiratory: Positive for shortness of breath. Negative for cough and wheezing.        DOE  Cardiovascular: Positive for leg swelling. Negative for chest pain.  Gastrointestinal: Negative for abdominal pain and constipation.  Genitourinary: Negative for difficulty urinating, dysuria and urgency.  Musculoskeletal: Positive for arthralgias, gait problem and myalgias.  Skin: Positive for color change.  Neurological: Negative for speech difficulty, weakness and light-headedness.       Memory lapses.   Psychiatric/Behavioral: Negative for behavioral problems and sleep disturbance. The patient is not nervous/anxious.     Immunization History  Administered Date(s) Administered  . Influenza Whole 09/14/2018  . Influenza, High Dose Seasonal PF 09/15/2019  . Influenza-Unspecified 09/13/2015, 09/29/2016, 10/03/2017  . Moderna SARS-COVID-2 Vaccination 12/15/2019, 01/12/2020  . Pneumococcal Conjugate-13 10/03/2017  . Pneumococcal-Unspecified 05/14/2015  . Tdap 04/13/2011   Pertinent  Health Maintenance Due  Topic Date Due  . INFLUENZA VACCINE  07/13/2020  . DEXA SCAN  Completed  . PNA vac Low Risk Adult  Completed   Fall Risk  09/01/2018 08/23/2017 08/12/2016  Falls in the past year? No No No   Functional Status Survey:    Vitals:   05/19/20 1021  BP: (!) 142/76  Pulse: 68  Resp: 18  Temp: (!) 97 F (36.1 C)  SpO2: 98%  Weight: 160 lb (72.6 kg)  Height: _0  (1.702 m)   Body mass index is 25.06 kg/m. Physical Exam Vitals and nursing note reviewed.  Constitutional:      Appearance: Normal  appearance. She is obese.  HENT:     Head: Normocephalic and atraumatic.     Mouth/Throat:     Mouth: Mucous membranes are moist.  Eyes:     Extraocular Movements: Extraocular movements intact.     Conjunctiva/sclera: Conjunctivae normal.     Pupils: Pupils are equal, round, and reactive to light.  Cardiovascular:     Rate and Rhythm: Normal rate. Rhythm irregular.     Heart sounds: No murmur heard.   Pulmonary:     Breath sounds: Rales present.     Comments: Bibasilar rales.  Abdominal:     General: Bowel sounds are normal.     Palpations: Abdomen is soft.     Tenderness: There is no abdominal tenderness. There is no rebound.  Musculoskeletal:     Cervical back: Normal range of motion.     Right lower leg: Edema present.     Left lower leg: Edema present.     Comments: Trace edema BLE. Limited ROM R+L shoulders.   Skin:    General: Skin is warm and dry.     Comments: Chronic venous insufficiency skin changes BLE  Neurological:     General: No focal deficit present.     Mental Status: She is alert. Mental status is at baseline.     Gait: Gait abnormal.     Comments: Oriented to person, place.   Psychiatric:        Mood and Affect: Mood normal.        Behavior: Behavior normal.        Thought Content: Thought content normal.        Judgment: Judgment normal.     Labs reviewed: Recent Labs    02/14/20 0000  NA 141  K 3.7  CL 103  CO2 31*  BUN 22*  CREATININE 0.8  CALCIUM 9.7   Recent Labs    02/14/20 0000  AST 22  ALT 14  ALKPHOS 45  ALBUMIN 3.9   Recent Labs    02/14/20 0000  WBC 4.7  NEUTROABS 3,173  HGB 13.1  HCT 38  PLT 132*   Lab Results  Component Value Date   TSH 1.82 03/07/2018   Lab Results  Component Value Date   HGBA1C 5.4 05/17/2017   Lab Results  Component Value Date   CHOL  148 02/14/2020   HDL 57 02/14/2020   LDLCALC 71 02/14/2020   TRIG 121 02/14/2020   CHOLHDL 2.4 05/04/2016    Significant Diagnostic Results in last  30 days:  No results found.  Assessment/Plan Chronic diastolic CHF (congestive heart failure), NYHA class 3 (HCC) Weight is stable, chronic edema BLE, continue abd pad ACE wrap RLE, update CBC/diff, CMP/eGFR, continue Furosemide.   Atrial fibrillation (HCC) Heart rate is in control, continue Metoprolol, Diltiazem, Xarelto  Essential hypertension Blood pressure is controlled, continue Metoprolol, Diltiazem, Enalapril.   Rheumatoid arthritis (HCC) Multiple sites, left shoulder is more limited ROM, continue Prednisone 51m qd, Tylenol.      Family/ staff Communication: plan of care reviewed with the patient and charge nurse.   Labs/tests ordered:  CBC/diff, CMP/eGFR  Time spend 40 minutes.

## 2020-05-20 DIAGNOSIS — I5032 Chronic diastolic (congestive) heart failure: Secondary | ICD-10-CM | POA: Diagnosis not present

## 2020-05-21 LAB — BASIC METABOLIC PANEL
BUN: 24 — AB (ref 4–21)
CO2: 25 — AB (ref 13–22)
Chloride: 104 (ref 99–108)
Creatinine: 0.9 (ref 0.5–1.1)
Glucose: 97
Potassium: 4.1 (ref 3.4–5.3)
Sodium: 139 (ref 137–147)

## 2020-05-21 LAB — HEPATIC FUNCTION PANEL
ALT: 15 (ref 7–35)
AST: 21 (ref 13–35)
Alkaline Phosphatase: 54 (ref 25–125)
Bilirubin, Total: 0.7

## 2020-05-21 LAB — COMPREHENSIVE METABOLIC PANEL
Albumin: 4.2 (ref 3.5–5.0)
Calcium: 9.5 (ref 8.7–10.7)
Globulin: 1.6

## 2020-05-21 LAB — CBC AND DIFFERENTIAL
HCT: 40 (ref 36–46)
Hemoglobin: 13.4 (ref 12.0–16.0)
Neutrophils Absolute: 3478
Platelets: 140 — AB (ref 150–399)
WBC: 5.6

## 2020-05-21 LAB — CBC: RBC: 4.14 (ref 3.87–5.11)

## 2020-05-26 ENCOUNTER — Encounter: Payer: Self-pay | Admitting: Nurse Practitioner

## 2020-05-26 NOTE — Assessment & Plan Note (Signed)
Blood pressure is controlled, continue Metoprolol, Diltiazem, Enalapril.

## 2020-05-26 NOTE — Assessment & Plan Note (Signed)
Weight is stable, chronic edema BLE, continue abd pad ACE wrap RLE, update CBC/diff, CMP/eGFR, continue Furosemide.

## 2020-05-26 NOTE — Assessment & Plan Note (Signed)
Multiple sites, left shoulder is more limited ROM, continue Prednisone 4mg  qd, Tylenol.

## 2020-05-26 NOTE — Assessment & Plan Note (Signed)
Heart rate is in control, continue Metoprolol, Diltiazem, Xarelto

## 2020-06-17 DIAGNOSIS — I5032 Chronic diastolic (congestive) heart failure: Secondary | ICD-10-CM | POA: Diagnosis not present

## 2020-08-01 ENCOUNTER — Non-Acute Institutional Stay: Payer: PPO | Admitting: Internal Medicine

## 2020-08-01 ENCOUNTER — Encounter: Payer: Self-pay | Admitting: Internal Medicine

## 2020-08-01 DIAGNOSIS — L0291 Cutaneous abscess, unspecified: Secondary | ICD-10-CM | POA: Diagnosis not present

## 2020-08-01 DIAGNOSIS — I5032 Chronic diastolic (congestive) heart failure: Secondary | ICD-10-CM | POA: Diagnosis not present

## 2020-08-01 DIAGNOSIS — I4811 Longstanding persistent atrial fibrillation: Secondary | ICD-10-CM

## 2020-08-01 DIAGNOSIS — M0579 Rheumatoid arthritis with rheumatoid factor of multiple sites without organ or systems involvement: Secondary | ICD-10-CM

## 2020-08-01 DIAGNOSIS — E785 Hyperlipidemia, unspecified: Secondary | ICD-10-CM

## 2020-08-01 LAB — MAGNESIUM: Magnesium: 2.1

## 2020-08-01 NOTE — Progress Notes (Signed)
Location: [facility] Room Number: 740 Place of Service:  ALF [phone])  Provider: Veleta Miners MD  Code Status: DNR Goals of Care:  Advanced Directives 05/19/2020  Does Patient Have a Medical Advance Directive? Yes  Type of Paramedic of [place];Living will;Out of facility DNR (pink MOST or yellow form)  Does patient want to make changes to medical advance directive? No - Patient declined  Copy of [place] in Chart? Yes - validated most recent copy scanned in chart (See row information)  Would patient like information on creating a medical advance directive? -  Pre-existing out of facility DNR order (yellow form or pink MOST form) Yellow form placed in chart (order not valid for inpatient use)     Chief Complaint  Patient presents with  . Acute Visit    Skin lesion    HPI: Patient is a 86 y.o. female seen today for an acute visit for Swelling in her Skin in Left arm Patient has h/o Chronic Atrial Fibrillation on Xarelto, Hypertension, Diastolic CHF, Rheumatoid arthritis on Chronic Prednisone  [place] in [place] Noticed small Swelling in her Left Arm. It is now Painful. No Discharge No Trauma that patient remembers Denies Fever or Chills.  She is mostly Wheelchair dependent. Walks some distance with walker. Independent in her ADLS  Past Medical History:  Diagnosis Date  . Cervicalgia 08/12/2016  . CHF (congestive heart failure) ([place])   . Chronic diastolic CHF (congestive heart failure), NYHA class 3 ([place]) 06/17/2016  . Complication of anesthesia   . Edema 08/12/2016  . Hypertension   . New onset atrial fibrillation ([place]) 04/2016   a. started on Xarelto and Cardizem  . PONV (postoperative nausea and vomiting)   . RA (rheumatoid arthritis) ([place])   . SCC (squamous cell carcinoma) KA 09/15/2016   Left forearm - tx p bx  . SOB (shortness of breath) 04/2016   Hospitalist  . Vertigo     Past Surgical History:    Procedure Laterality Date  . Biopsy right breast Right    fibrocystic changes  . CARDIOVERSION N/A 07/08/2016   Procedure: CARDIOVERSION;  Surgeon: Larey Dresser, MD;  Location: [facility];  Service: Cardiovascular;  Laterality: N/A;  . CERVICAL SPINE SURGERY  2005   Dr. Vertell Limber  . HYSTEROSCOPY  2001  . JOINT REPLACEMENT    . REPLACEMENT TOTAL KNEE Right 1996   [place] Bilateral [id number]   Baird Lyons MD    Allergies  Allergen Reactions  . Morphine Sulfate Other (See Comments)    "extremely nauseated"  . Other Nausea And Vomiting    Pt states she is very sensitive to pain medications.    Outpatient Encounter Medications as of 08/01/2020  Medication Sig  . acetaminophen (TYLENOL) 500 MG tablet Take 1,000 mg by mouth 2 (two) times daily.   Marland Kitchen acetaminophen (TYLENOL) 500 MG tablet Take 500 mg by mouth at bedtime.  Marland Kitchen amoxicillin (AMOXIL) 500 MG tablet Take 2,000 mg by mouth as needed. Take 1 hour prior to dental appointment.   . Calcium Carbonate (CALCIUM 600 PO) Take 1and 1/2  Tablet = 900 mg in the evening with dinner .  . calcium carbonate (OSCAL) 1500 (600 Ca) MG TABS tablet Take 1,500 mg by mouth daily. OTC  . dextromethorphan-guaiFENesin (MUCINEX DM) 30-600 MG 12hr tablet Take 1 tablet by mouth 2 (two) times daily as needed for cough.  . diltiazem (CARDIZEM CD) 120 MG  24 hr capsule Take 1 capsule (120 mg total) by mouth daily.  . enalapril (VASOTEC) 20 MG tablet Take 20 mg by mouth 2 (two) times daily.    . furosemide (LASIX) 40 MG tablet Take 40 mg by mouth daily.   . Lidocaine (PAIN RELIEF MAXIMUM STRENGTH) 4 % PTCH Apply 1 patch topically every 12 (twelve) hours as needed. Apply patch to right side of neck on for 12 hours and off 12 hours at night.  . loratadine (CLARITIN) 10 MG tablet Take 10 mg by mouth daily.  . Magnesium Oxide 250 MG TABS Take 250 mg by mouth daily.  . Menthol (BIOFREEZE) 10 % AERO Apply topically 3 (three) times  daily as needed. Apply cream to right side of neck.  . metoprolol succinate (TOPROL-XL) 50 MG 24 hr tablet Take 50 mg by mouth. Give with Metoprolol 100mg  Once A Day  . Metoprolol Succinate 100 MG CS24 Take 150 mg by mouth at bedtime.  . OXYGEN Inhale 2 L into the lungs daily as needed (shortness of breath). Use 2 liter as needed for SOB, 02 sat < 90% on room air   . potassium chloride SA (K-DUR,KLOR-CON) 20 MEQ tablet Take 20 mEq by mouth daily.   . predniSONE (DELTASONE) 1 MG tablet Take 4 mg by mouth daily with breakfast.  . Rivaroxaban (XARELTO) 15 MG TABS tablet Take 15 mg by mouth daily with supper.  . Soft Lens Products (REWETTING DROPS) SOLN 1 drop by Does not apply route every 6 (six) hours as needed.  . vitamin C (ASCORBIC ACID) 500 MG tablet Take 500 mg by mouth daily.    . Wheat Dextrin (BENEFIBER PO) Take 4 scoop by mouth daily.    No facility-administered encounter medications on file as of 08/01/2020.    Review of Systems:  Review of Systems  Constitutional: Negative.   HENT: Negative.   Respiratory: Negative.   Cardiovascular: Positive for leg swelling.  Gastrointestinal: Negative.   Genitourinary: Negative.   Musculoskeletal: Positive for gait problem.  Skin: Positive for wound.  Neurological: Negative for dizziness.  Psychiatric/Behavioral: Negative.     Health Maintenance  Topic Date Due  . INFLUENZA VACCINE  07/13/2020  . TETANUS/TDAP  04/12/2021  . DEXA SCAN  Completed  . COVID-19 Vaccine  Completed  . PNA vac Low Risk Adult  Completed    Physical Exam: Vitals:   08/01/20 1458  BP: 134/76  Pulse: 76  Resp: 18  Temp: 98.5 F (36.9 C)  SpO2: 95%  Weight: 159 lb (72.1 kg)  Height: 5\' 7"  (1.702 m)   Body mass index is 24.9 kg/m. Physical Exam Vitals reviewed.  Constitutional:      Appearance: Normal appearance.  HENT:     Head: Normocephalic.     Nose: Nose normal.     Mouth/Throat:     Mouth: Mucous membranes are moist.     Pharynx:  Oropharynx is clear.  Eyes:     Pupils: Pupils are equal, round, and reactive to light.  Cardiovascular:     Rate and Rhythm: Normal rate and regular rhythm.     Pulses: Normal pulses.  Pulmonary:     Effort: Pulmonary effort is normal.     Breath sounds: Normal breath sounds.  Abdominal:     General: Abdomen is flat. Bowel sounds are normal.     Palpations: Abdomen is soft.  Musculoskeletal:        General: Swelling present.     Cervical back: Neck supple.  Skin:    Comments: Small Swelling in Left Arm Painful. Fluctuant. Mild redness  Neurological:     General: No focal deficit present.     Mental Status: She is alert and oriented to person, place, and time.  Psychiatric:        Mood and Affect: Mood normal.        Thought Content: Thought content normal.     Labs reviewed: Basic Metabolic Panel: Recent Labs    02/14/20 0000 05/21/20 0000 06/17/20 0000  NA 141 139  --   K 3.7 4.1  --   CL 103 104  --   CO2 31* 25*  --   BUN 22* 24*  --   CREATININE 0.8 0.9  --   CALCIUM 9.7 9.5  --   MG  --   --  2.1   Liver Function Tests: Recent Labs    02/14/20 0000 05/21/20 0000  AST 22 21  ALT 14 15  ALKPHOS 45 54  ALBUMIN 3.9 4.2   No results for input(s): LIPASE, AMYLASE in the last 8760 hours. No results for input(s): AMMONIA in the last 8760 hours. CBC: Recent Labs    02/14/20 0000 05/21/20 0000  WBC 4.7 5.6  NEUTROABS 3,173 3,478  HGB 13.1 13.4  HCT 38 40  PLT 132* 140*   Lipid Panel: Recent Labs    09/25/19 0000 02/14/20 0000  CHOL 154 148  HDL 59 57  LDLCALC 72 71  TRIG  --  121   Lab Results  Component Value Date   HGBA1C 5.4 05/17/2017    Procedures since last visit: No results found.  Assessment/Plan Possible Abscess/ Can also Be small Hematoma Doxycyline 100 mg BID for 7 days Reval in 1 week  Other issues Chronic diastolic CHF (congestive heart failure), NYHA class 3 (HCC) Stable on Lasix Longstanding persistent atrial  fibrillation (HCC) Xarelto and Metoprolol and Cardizem Rheumatoid arthritis involving multiple sites with positive rheumatoid factor (HCC) On Chronic Prednisone     Labs/tests ordered:  * No order type specified * Next appt:  Visit date not found

## 2020-09-01 ENCOUNTER — Non-Acute Institutional Stay: Payer: PPO | Admitting: Internal Medicine

## 2020-09-01 ENCOUNTER — Encounter: Payer: Self-pay | Admitting: Internal Medicine

## 2020-09-01 DIAGNOSIS — R600 Localized edema: Secondary | ICD-10-CM

## 2020-09-01 DIAGNOSIS — I4811 Longstanding persistent atrial fibrillation: Secondary | ICD-10-CM | POA: Diagnosis not present

## 2020-09-01 DIAGNOSIS — I10 Essential (primary) hypertension: Secondary | ICD-10-CM

## 2020-09-01 DIAGNOSIS — I5032 Chronic diastolic (congestive) heart failure: Secondary | ICD-10-CM | POA: Diagnosis not present

## 2020-09-01 DIAGNOSIS — M0579 Rheumatoid arthritis with rheumatoid factor of multiple sites without organ or systems involvement: Secondary | ICD-10-CM

## 2020-09-01 DIAGNOSIS — E785 Hyperlipidemia, unspecified: Secondary | ICD-10-CM

## 2020-09-01 NOTE — Progress Notes (Signed)
Location:   [facility] Room Number: [place] of Service:  ALF [phone]) Provider:  Veleta Miners MD  Virgie Dad, MD  Patient Care Team: Virgie Dad, MD as PCP - General (Internal Medicine) Mast, Man X, NP as Nurse Practitioner (Internal Medicine) Martinique, Peter M, MD as Consulting Physician (Cardiology) Lavonna Monarch, MD as Consulting Physician (Dermatology) Garald Balding, MD as Consulting Physician (Orthopedic Surgery)  Extended Emergency Contact Information Primary Emergency Contact: Berneta Levins Address: [address] [place], [place] [address] Johnnette Litter of [place] Phone: [phone] Work Phone: [phone] Mobile Phone: [phone] Relation: Daughter Secondary Emergency Contact: Azalee Course [place] of [place] Phone: [phone] Mobile Phone: [phone] Relation: Son  Code Status:  DNR Goals of care: Advanced Directive information Advanced Directives 05/19/2020  Does Patient Have a Medical Advance Directive? Yes  Type of Paramedic of [place];Living will;Out of facility DNR (pink MOST or yellow form)  Does patient want to make changes to medical advance directive? No - Patient declined  Copy of [place] in Chart? Yes - validated most recent copy scanned in chart (See row information)  Would patient like information on creating a medical advance directive? -  Pre-existing out of facility DNR order (yellow form or pink MOST form) Yellow form placed in chart (order not valid for inpatient use)     Chief Complaint  Patient presents with  . Acute Visit    Lower Extremity swelling    HPI:  Pt is a 86 y.o. female seen today for an acute visit for Bilateral LE swelling with Leakage  Patient has h/o Chronic Atrial Fibrillation on Xarelto, Hypertension, Diastolic CHF, Rheumatoid arthritis on Chronic Prednisone  [place] She has a history of lower  extremity swelling. But recently nurses have noticed that her clothes have been getting wet due to  Discharge from the legs.  She has gained 3 to 4 pounds  she denies any shortness of breath or coughing. No other complaints today Patient is mostly wheelchair dependent.  Walks some distance with walker and is independent in her ADLs Past Medical History:  Diagnosis Date  . Cervicalgia 08/12/2016  . CHF (congestive heart failure) ([facility])   . Chronic diastolic CHF (congestive heart failure), NYHA class 3 ([place]) 06/17/2016  . Complication of anesthesia   . Edema 08/12/2016  . Hypertension   . New onset atrial fibrillation ([place]) 04/2016   a. started on Xarelto and Cardizem  . PONV (postoperative nausea and vomiting)   . RA (rheumatoid arthritis) ([place] [place])   . SCC (squamous cell carcinoma) KA 09/15/2016   Left forearm - tx p bx  . SOB (shortness of breath) 04/2016   Hospitalist  . Vertigo    Past Surgical History:  Procedure Laterality Date  . Biopsy right breast Right    fibrocystic changes  . CARDIOVERSION N/A 07/08/2016   Procedure: CARDIOVERSION;  Surgeon: Larey Dresser, MD;  Location: [facility];  Service: Cardiovascular;  Laterality: N/A;  . CERVICAL SPINE SURGERY  2005   Dr. Vertell Limber  . HYSTEROSCOPY  2001  . JOINT REPLACEMENT    . REPLACEMENT TOTAL KNEE Right 1996   [place] Bilateral [phone]   Baird Lyons MD    Allergies  Allergen Reactions  . Morphine Sulfate Other (See Comments)    "extremely nauseated"  . Other Nausea And Vomiting    Pt  states she is very sensitive to pain medications.    Allergies as of 09/01/2020      Reactions   Morphine Sulfate Other (See Comments)   "extremely nauseated"   Other Nausea And Vomiting   Pt states she is very sensitive to pain medications.      Medication List       Accurate as of September 01, 2020  4:01 PM. If you have any questions, ask your nurse or doctor.        acetaminophen 500 MG  tablet Commonly known as: TYLENOL Take 1,000 mg by mouth 2 (two) times daily.   acetaminophen 500 MG tablet Commonly known as: TYLENOL Take 500 mg by mouth at bedtime.   amoxicillin 500 MG tablet Commonly known as: AMOXIL Take 2,000 mg by mouth as needed. Take 1 hour prior to dental appointment.   BENEFIBER PO Take 4 scoop by mouth daily.   Biofreeze 10 % Aero Generic drug: Menthol Apply topically 3 (three) times daily as needed. Apply cream to right side of neck.   CALCIUM 600 PO Take 1and 1/2  Tablet = 900 mg in the evening with dinner .   calcium carbonate 1500 (600 Ca) MG Tabs tablet Commonly known as: OSCAL Take 1,500 mg by mouth daily. OTC   Claritin 10 MG tablet Generic drug: loratadine Take 10 mg by mouth daily.   dextromethorphan-guaiFENesin 30-600 MG 12hr tablet Commonly known as: MUCINEX DM Take 1 tablet by mouth 2 (two) times daily as needed for cough.   diltiazem 120 MG 24 hr capsule Commonly known as: CARDIZEM CD Take 1 capsule (120 mg total) by mouth daily.   enalapril 20 MG tablet Commonly known as: VASOTEC Take 20 mg by mouth 2 (two) times daily.   furosemide 40 MG tablet Commonly known as: LASIX Take 40 mg by mouth daily.   Magnesium Oxide 250 MG Tabs Take 250 mg by mouth daily.   metoprolol succinate 50 MG 24 hr tablet Commonly known as: TOPROL-XL Take 50 mg by mouth. Give with Metoprolol 100mg  Once A Day   Metoprolol Succinate 100 MG Cs24 Take 150 mg by mouth at bedtime.   OXYGEN Inhale 2 L into the lungs daily as needed (shortness of breath). Use 2 liter as needed for SOB, 02 sat < 90% on room air   Pain Relief Maximum Strength 4 % Ptch Generic drug: Lidocaine Apply 1 patch topically every 12 (twelve) hours as needed. Apply patch to right side of neck on for 12 hours and off 12 hours at night.   potassium chloride SA 20 MEQ tablet Commonly known as: KLOR-CON Take 20 mEq by mouth daily.   predniSONE 1 MG tablet Commonly known  as: DELTASONE Take 4 mg by mouth daily with breakfast.   Rewetting Drops Soln 1 drop by Does not apply route every 6 (six) hours as needed.   vitamin C 500 MG tablet Commonly known as: ASCORBIC ACID Take 500 mg by mouth daily.   Xarelto 15 MG Tabs tablet Generic drug: Rivaroxaban Take 15 mg by mouth daily with supper.       Review of Systems  Constitutional: Positive for unexpected weight change.  HENT: Negative.   Eyes: Negative.   Respiratory: Negative for cough.   Cardiovascular: Positive for leg swelling.  Gastrointestinal: Negative.   Genitourinary: Negative.   Musculoskeletal: Positive for gait problem.  Skin: Negative.   Neurological: Negative for dizziness.  Psychiatric/Behavioral: Negative.     Immunization History  Administered Date(s)  Administered  . Influenza Whole 09/14/2018  . Influenza, High Dose Seasonal PF 09/15/2019  . Influenza-Unspecified 09/13/2015, 09/29/2016, 10/03/2017  . Moderna SARS-COVID-2 Vaccination 12/15/2019, 01/12/2020  . Pneumococcal Conjugate-13 10/03/2017  . Pneumococcal-Unspecified 05/14/2015  . Tdap 04/13/2011   Pertinent  Health Maintenance Due  Topic Date Due  . INFLUENZA VACCINE  07/13/2020  . DEXA SCAN  Completed  . PNA vac Low Risk Adult  Completed   Fall Risk  09/01/2018 08/23/2017 08/12/2016  Falls in the past year? No No No   Functional Status Survey:    Vitals:   09/01/20 1550  BP: 126/72  Pulse: 64  Resp: 18  Temp: 99 F (37.2 C)  SpO2: 93%  Weight: 161 lb 12.8 oz (73.4 kg)  Height: 5\' 7"  (1.702 m)   Body mass index is 25.34 kg/m. Physical Exam Vitals reviewed.  Constitutional:      Appearance: She is normal weight.  HENT:     Head: Normocephalic.     Nose: Nose normal.     Mouth/Throat:     Mouth: Mucous membranes are moist.     Pharynx: Oropharynx is clear.  Eyes:     Pupils: Pupils are equal, round, and reactive to light.  Cardiovascular:     Rate and Rhythm: Normal rate and regular rhythm.       Pulses: Normal pulses.  Pulmonary:     Effort: Pulmonary effort is normal. No respiratory distress.     Breath sounds: Normal breath sounds. No wheezing or rales.  Abdominal:     General: Abdomen is flat. Bowel sounds are normal.     Palpations: Abdomen is soft.  Musculoskeletal:     Cervical back: Neck supple.     Comments: Chronic Venous changes Bilateral with Edema  Skin:    General: Skin is warm.  Neurological:     General: No focal deficit present.     Mental Status: She is alert and oriented to person, place, and time.  Psychiatric:        Mood and Affect: Mood normal.        Thought Content: Thought content normal.     Labs reviewed: Recent Labs    02/14/20 0000 05/21/20 0000 06/17/20 0000  NA 141 139  --   K 3.7 4.1  --   CL 103 104  --   CO2 31* 25*  --   BUN 22* 24*  --   CREATININE 0.8 0.9  --   CALCIUM 9.7 9.5  --   MG  --   --  2.1   Recent Labs    02/14/20 0000 05/21/20 0000  AST 22 21  ALT 14 15  ALKPHOS 45 54  ALBUMIN 3.9 4.2   Recent Labs    02/14/20 0000 05/21/20 0000  WBC 4.7 5.6  NEUTROABS 3,173 3,478  HGB 13.1 13.4  HCT 38 40  PLT 132* 140*   Lab Results  Component Value Date   TSH 1.82 03/07/2018   Lab Results  Component Value Date   HGBA1C 5.4 05/17/2017   Lab Results  Component Value Date   CHOL 148 02/14/2020   HDL 57 02/14/2020   LDLCALC 71 02/14/2020   TRIG 121 02/14/2020   CHOLHDL 2.4 05/04/2016    Significant Diagnostic Results in last 30 days:  No results found.  Assessment/Plan Bilateral leg edema Increase the Lasix to 60 mg QD and Potassium to 30 meq Repeat BMP in 1 week  Chronic diastolic CHF (congestive heart failure),  NYHA class 3 (HCC) Increase Lasix  EF was 65% in 2017  Longstanding persistent atrial fibrillation (HCC) On Xarelto, Metoprolol and Cardizem  Other issues Rheumatoid arthritis involving multiple sites with positive rheumatoid factor (HCC) On Chronic Prednisone D/W her if she  would like to follow with Rheumatology Not intrested right now Essential hypertension Doing well On Vasotec and Cardizem  Hyperlipidemia, On Crestor Bilateral Shoulder Arthritis Pain stable on Tyelnol    Family/ staff Communication:   Labs/tests ordered:

## 2020-09-09 DIAGNOSIS — I5032 Chronic diastolic (congestive) heart failure: Secondary | ICD-10-CM | POA: Diagnosis not present

## 2020-09-10 LAB — BASIC METABOLIC PANEL
BUN: 21 (ref 4–21)
CO2: 28 — AB (ref 13–22)
Chloride: 101 (ref 99–108)
Creatinine: 0.9 (ref 0.5–1.1)
Glucose: 89
Potassium: 4.2 (ref 3.4–5.3)
Sodium: 138 (ref 137–147)

## 2020-09-10 LAB — COMPREHENSIVE METABOLIC PANEL: Calcium: 9.5 (ref 8.7–10.7)

## 2020-09-11 ENCOUNTER — Encounter: Payer: Self-pay | Admitting: Nurse Practitioner

## 2020-09-11 DIAGNOSIS — N183 Chronic kidney disease, stage 3 unspecified: Secondary | ICD-10-CM | POA: Insufficient documentation

## 2020-10-06 ENCOUNTER — Non-Acute Institutional Stay: Payer: PPO | Admitting: Internal Medicine

## 2020-10-06 ENCOUNTER — Encounter: Payer: Self-pay | Admitting: Internal Medicine

## 2020-10-06 DIAGNOSIS — M0579 Rheumatoid arthritis with rheumatoid factor of multiple sites without organ or systems involvement: Secondary | ICD-10-CM

## 2020-10-06 DIAGNOSIS — I5032 Chronic diastolic (congestive) heart failure: Secondary | ICD-10-CM | POA: Diagnosis not present

## 2020-10-06 DIAGNOSIS — R600 Localized edema: Secondary | ICD-10-CM

## 2020-10-06 DIAGNOSIS — E785 Hyperlipidemia, unspecified: Secondary | ICD-10-CM | POA: Diagnosis not present

## 2020-10-06 DIAGNOSIS — I10 Essential (primary) hypertension: Secondary | ICD-10-CM

## 2020-10-06 DIAGNOSIS — I4811 Longstanding persistent atrial fibrillation: Secondary | ICD-10-CM

## 2020-10-06 NOTE — Progress Notes (Signed)
Location:   [facility] Room Number: [place] of Service:  ALF [phone]) Provider:  Veleta Miners MD   Virgie Dad, MD  Patient Care Team: Virgie Dad, MD as PCP - General (Internal Medicine) Mast, Man X, NP as Nurse Practitioner (Internal Medicine) Martinique, Peter M, MD as Consulting Physician (Cardiology) Lavonna Monarch, MD as Consulting Physician (Dermatology) Garald Balding, MD as Consulting Physician (Orthopedic Surgery)  Extended Emergency Contact Information Primary Emergency Contact: Berneta Levins Address: [address] [place], [place] [address] Johnnette Litter of [place] Phone: [phone] Work Phone: [phone] Mobile Phone: [phone] Relation: Daughter Secondary Emergency Contact: Azalee Course States of [place] Phone: [phone] Mobile Phone: [phone] Relation: Son  Code Status:  DNR Goals of care: Advanced Directive information Advanced Directives 10/06/2020  Does Patient Have a Medical Advance Directive? Yes  Type of Paramedic of [place];Living will  Does patient want to make changes to medical advance directive? No - Patient declined  Copy of [place] in Chart? Yes - validated most recent copy scanned in chart (See row information)  Would patient like information on creating a medical advance directive? -  Pre-existing out of facility DNR order (yellow form or pink MOST form) -     Chief Complaint  Patient presents with  . Medical Management of Chronic Issues    HPI:  Pt is a 86 y.o. female seen today for medical management of chronic diseases.    Patient has h/o Chronic Atrial Fibrillation on Xarelto, Hypertension, Diastolic CHF, Rheumatoid arthritis on Chronic Prednisone  [place]ives in [place] Patient is doing well in [place].  Did not have any acute complaints. Higher Lasix was increased few weeks ago due to increased swelling and discharge from  the legs. Since then patient has lost 5 pounds.  The discharge from the legs has stopped.  She feels better.  Denies any dizziness or weakness.  Has urinary frequency.  But says it is manageable.  BUN and creatinine are stable  Past Medical History:  Diagnosis Date  . Cervicalgia 08/12/2016  . CHF (congestive heart failure) ([place])   . Chronic diastolic CHF (congestive heart failure), NYHA class 3 ([place] [facility]) 06/17/2016  . Complication of anesthesia   . Edema 08/12/2016  . Hypertension   . New onset atrial fibrillation ([facility]) 04/2016   a. started on Xarelto and Cardizem  . PONV (postoperative nausea and vomiting)   . RA (rheumatoid arthritis) ([facility])   . SCC (squamous cell carcinoma) KA 09/15/2016   Left forearm - tx p bx  . SOB (shortness of breath) 04/2016   Hospitalist  . Vertigo    Past Surgical History:  Procedure Laterality Date  . Biopsy right breast Right    fibrocystic changes  . CARDIOVERSION N/A 07/08/2016   Procedure: CARDIOVERSION;  Surgeon: Larey Dresser, MD;  Location: [facility];  Service: Cardiovascular;  Laterality: N/A;  . CERVICAL SPINE SURGERY  2005   Dr. Vertell Limber  . HYSTEROSCOPY  2001  . JOINT REPLACEMENT    . REPLACEMENT TOTAL KNEE Right 1996   [place] Bilateral [id number]   Baird Lyons MD    Allergies  Allergen Reactions  . Morphine Sulfate Other (See Comments)    "extremely nauseated"  . Other Nausea And Vomiting    Pt states she is very sensitive to pain medications.    Allergies as of 10/06/2020  Reactions   Morphine Sulfate Other (See Comments)   "extremely nauseated"   Other Nausea And Vomiting   Pt states she is very sensitive to pain medications.      Medication List       Accurate as of October 06, 2020 11:59 PM. If you have any questions, ask your nurse or doctor.        acetaminophen 500 MG tablet Commonly known as: TYLENOL Take 1,000 mg by mouth 2 (two) times daily.   acetaminophen 500 MG  tablet Commonly known as: TYLENOL Take 500 mg by mouth at bedtime.   amoxicillin 500 MG tablet Commonly known as: AMOXIL Take 2,000 mg by mouth as needed. Take 1 hour prior to dental appointment.   BENEFIBER PO Take 4 scoop by mouth daily.   Biofreeze 10 % Aero Generic drug: Menthol Apply topically in the morning, at noon, in the evening, and at bedtime. Apply cream to right side of neck.   CALCIUM 600 PO Take 1and 1/2  Tablet = 900 mg in the evening with dinner .   calcium carbonate 1500 (600 Ca) MG Tabs tablet Commonly known as: OSCAL Take 1,500 mg by mouth daily. OTC   Claritin 10 MG tablet Generic drug: loratadine Take 10 mg by mouth daily.   dextromethorphan-guaiFENesin 30-600 MG 12hr tablet Commonly known as: MUCINEX DM Take 1 tablet by mouth 2 (two) times daily as needed for cough.   diltiazem 120 MG 24 hr capsule Commonly known as: CARDIZEM CD Take 1 capsule (120 mg total) by mouth daily.   enalapril 20 MG tablet Commonly known as: VASOTEC Take 20 mg by mouth 2 (two) times daily.   furosemide 40 MG tablet Commonly known as: LASIX Take 60 mg by mouth daily.   Magnesium Oxide 250 MG Tabs Take 250 mg by mouth daily.   metoprolol succinate 50 MG 24 hr tablet Commonly known as: TOPROL-XL Take 50 mg by mouth. Give with Metoprolol 100mg  Once A Day   Metoprolol Succinate 100 MG Cs24 Take 150 mg by mouth at bedtime.   OXYGEN Inhale 2 L into the lungs daily as needed (shortness of breath). Use 2 liter as needed for SOB, 02 sat < 90% on room air   Pain Relief Maximum Strength 4 % Ptch Generic drug: Lidocaine Apply 1 patch topically every 12 (twelve) hours as needed. Apply patch to right side of neck on for 12 hours and off 12 hours at night.   potassium chloride SA 20 MEQ tablet Commonly known as: KLOR-CON Take 30 mEq by mouth daily.   predniSONE 1 MG tablet Commonly known as: DELTASONE Take 4 mg by mouth daily with breakfast.   Rewetting Drops Soln 1  drop by Does not apply route every 6 (six) hours as needed.   vitamin C 500 MG tablet Commonly known as: ASCORBIC ACID Take 500 mg by mouth daily.   Xarelto 15 MG Tabs tablet Generic drug: Rivaroxaban Take 15 mg by mouth daily with supper.       Review of Systems  Review of Systems  Constitutional: Negative for activity change, appetite change, chills, diaphoresis, fatigue and fever.  HENT: Negative for mouth sores, postnasal drip, rhinorrhea, sinus pain and sore throat.   Respiratory: Negative for apnea, cough, chest tightness, shortness of breath and wheezing.   Cardiovascular: Negative for chest pain, palpitations and leg swelling.  Gastrointestinal: Negative for abdominal distention, abdominal pain, constipation, diarrhea, nausea and vomiting.  Genitourinary: Negative for dysuria and frequency.  Musculoskeletal:  Negative for arthralgias, joint swelling and myalgias.  Skin: Negative for rash.  Neurological: Negative for dizziness, syncope, weakness, light-headedness and numbness.  Psychiatric/Behavioral: Negative for behavioral problems, confusion and sleep disturbance.     Immunization History  Administered Date(s) Administered  . Influenza Whole 09/14/2018  . Influenza, High Dose Seasonal PF 09/15/2019  . Influenza-Unspecified 09/13/2015, 09/29/2016, 10/03/2017, 09/23/2020  . Moderna SARS-COVID-2 Vaccination 12/15/2019, 01/12/2020  . Pneumococcal Conjugate-13 10/03/2017  . Pneumococcal-Unspecified 05/14/2015  . Tdap 04/13/2011   Pertinent  Health Maintenance Due  Topic Date Due  . INFLUENZA VACCINE  Completed  . DEXA SCAN  Completed  . PNA vac Low Risk Adult  Completed   Fall Risk  09/01/2018 08/23/2017 08/12/2016  Falls in the past year? No No No   Functional Status Survey:    Vitals:   10/06/20 0935  BP: (!) 142/80  Pulse: 76  Resp: 20  Temp: 98.3 F (36.8 C)  SpO2: 97%  Weight: 155 lb 3.2 oz (70.4 kg)  Height: 5\' 7"  (1.702 m)   Body mass index is  24.31 kg/m. Physical Exam Constitutional: Oriented to person, place, and time. Well-developed and well-nourished.  HENT:  Head: Normocephalic.  Mouth/Throat: Oropharynx is clear and moist.  Eyes: Pupils are equal, round, and reactive to light.  Neck: Neck supple.  Cardiovascular: Normal rate and normal heart sounds.  No murmur heard. Pulmonary/Chest: Effort normal and breath sounds normal. No respiratory distress. No wheezes. She has no rales.  Abdominal: Soft. Bowel sounds are normal. No distension. There is no tenderness. There is no rebound.  Musculoskeletal:Mild edema Bilateral Lymphadenopathy: none Neurological: Alert and oriented to person, place, and time.  Skin: Skin is warm and dry.  Psychiatric: Normal mood and affect. Behavior is normal. Thought content normal.   Labs reviewed: Recent Labs    02/14/20 0000 05/21/20 0000 06/17/20 0000 09/10/20 0000  NA 141 139  --  138  K 3.7 4.1  --  4.2  CL 103 104  --  101  CO2 31* 25*  --  28*  BUN 22* 24*  --  21  CREATININE 0.8 0.9  --  0.9  CALCIUM 9.7 9.5  --  9.5  MG  --   --  2.1  --    Recent Labs    02/14/20 0000 05/21/20 0000  AST 22 21  ALT 14 15  ALKPHOS 45 54  ALBUMIN 3.9 4.2   Recent Labs    02/14/20 0000 05/21/20 0000  WBC 4.7 5.6  NEUTROABS 3,173 3,478  HGB 13.1 13.4  HCT 38 40  PLT 132* 140*   Lab Results  Component Value Date   TSH 1.82 03/07/2018   Lab Results  Component Value Date   HGBA1C 5.4 05/17/2017   Lab Results  Component Value Date   CHOL 148 02/14/2020   HDL 57 02/14/2020   LDLCALC 71 02/14/2020   TRIG 121 02/14/2020   CHOLHDL 2.4 05/04/2016    Significant Diagnostic Results in last 30 days:  No results found.  Assessment/Plan .Bilateral leg edema Doing well on Lasix Would continue same dose  BUN and Creat stable Chronic diastolic CHF (congestive heart failure), NYHA class 3 (HCC) On Lasix EF was 65% in 2017 Longstanding persistent atrial fibrillation (HCC) On  Xarelto,Cardizem and Metoprolol Rheumatoid arthritis involving multiple sites with positive rheumatoid factor (HCC) Chronic Prednisone Does not want to follow with Rheumatology Essential hypertension On Cardizem Hyperlipidemia, unspecified hyperlipidemia type Crestor LDL 71    Family/ staff Communication:  Labs/tests ordered:  BMP and CBC in 4 weeks

## 2020-10-08 ENCOUNTER — Encounter: Payer: Self-pay | Admitting: Nurse Practitioner

## 2020-10-08 ENCOUNTER — Non-Acute Institutional Stay: Payer: PPO | Admitting: Nurse Practitioner

## 2020-10-08 DIAGNOSIS — I4811 Longstanding persistent atrial fibrillation: Secondary | ICD-10-CM

## 2020-10-08 DIAGNOSIS — M0579 Rheumatoid arthritis with rheumatoid factor of multiple sites without organ or systems involvement: Secondary | ICD-10-CM | POA: Diagnosis not present

## 2020-10-08 DIAGNOSIS — I5032 Chronic diastolic (congestive) heart failure: Secondary | ICD-10-CM | POA: Diagnosis not present

## 2020-10-08 DIAGNOSIS — I10 Essential (primary) hypertension: Secondary | ICD-10-CM

## 2020-10-08 DIAGNOSIS — L03116 Cellulitis of left lower limb: Secondary | ICD-10-CM | POA: Diagnosis not present

## 2020-10-08 NOTE — Assessment & Plan Note (Signed)
CHF/edema BLE, on Furosemide 60mg  qd. EF 65% 2017, pend f/u BMP

## 2020-10-08 NOTE — Progress Notes (Signed)
Location:   [facility] Room Number: 563O Place of Service:  ALF (13) Provider: Lennie Odor Valarie Farace NP  Virgie Dad, MD  Patient Care Team: Virgie Dad, MD as PCP - General (Internal Medicine) Anniemae Haberkorn X, NP as Nurse Practitioner (Internal Medicine) [place], Peter M, MD as Consulting Physician (Cardiology) Lavonna Monarch, MD as Consulting Physician (Dermatology) Garald Balding, MD as Consulting Physician (Orthopedic Surgery)  Extended Emergency Contact Information Primary Emergency Contact: Berneta Levins Address: [address] [place], [place] [address] Johnnette Litter of [place] Phone: [phone] Work Phone: [phone] Mobile Phone: [phone] Relation: Daughter Secondary Emergency Contact: Azalee Course States of [place] Phone: [phone] Mobile Phone: [phone] Relation: Son  Code Status: DNR Goals of care: Advanced Directive information Advanced Directives 10/08/2020  Does Patient Have a Medical Advance Directive? Yes  Type of Paramedic of [place];Living will  Does patient want to make changes to medical advance directive? No - Patient declined  Copy of [place] in Chart? Yes - validated most recent copy scanned in chart (See row information)  Would patient like information on creating a medical advance directive? -  Pre-existing out of facility DNR order (yellow form or pink MOST form) -     Chief Complaint  Patient presents with  . Acute Visit    Right lower leg swelling and redness    HPI:  Pt is a 86 y.o. female seen today for an acute visit for left lateral leg swelling, redness, tenderness, and warmth developed from previous skin tear where Steri strips are intact.   CHF/edema BLE, on Furosemide 60mg  qd. EF 65% 2017  Afib, heart rate is in control, on Diltiazem 120mg  qd, Metoprolol 150mg  qd, Xarelto 15mg  qd.   HTN, blood pressure is controlled, on Enalapril 20mg   bid along with Metoprolol and Diltiazem.   RA, pain, multiples sites, on Prednisone 4mg  qd, Tylenol 1000mg  bid, 500mg  qd. f/u Rheumatology      Past Medical History:  Diagnosis Date  . Cervicalgia 08/12/2016  . CHF (congestive heart failure) ([facility])   . Chronic diastolic CHF (congestive heart failure), NYHA class 3 ([facility]) 06/17/2016  . Complication of anesthesia   . Edema 08/12/2016  . Hypertension   . New onset atrial fibrillation ([facility]) 04/2016   a. started on Xarelto and Cardizem  . PONV (postoperative nausea and vomiting)   . RA (rheumatoid arthritis) ([facility])   . SCC (squamous cell carcinoma) KA 09/15/2016   Left forearm - tx p bx  . SOB (shortness of breath) 04/2016   Hospitalist  . Vertigo    Past Surgical History:  Procedure Laterality Date  . Biopsy right breast Right    fibrocystic changes  . CARDIOVERSION N/A 07/08/2016   Procedure: CARDIOVERSION;  Surgeon: Larey Dresser, MD;  Location: [facility];  Service: Cardiovascular;  Laterality: N/A;  . CERVICAL SPINE SURGERY  2005   Dr. Vertell Limber  . HYSTEROSCOPY  2001  . JOINT REPLACEMENT    . REPLACEMENT TOTAL KNEE Right 1996   [place] Bilateral [id number]   Baird Lyons MD    Allergies  Allergen Reactions  . Morphine Sulfate Other (See Comments)    "extremely nauseated"  . Other Nausea And Vomiting    Pt states she is very sensitive to pain medications.    Allergies as of 10/08/2020      Reactions   Morphine Sulfate  Other (See Comments)   "extremely nauseated"   Other Nausea And Vomiting   Pt states she is very sensitive to pain medications.      Medication List       Accurate as of October 08, 2020 11:59 PM. If you have any questions, ask your nurse or doctor.        acetaminophen 500 MG tablet Commonly known as: TYLENOL Take 1,000 mg by mouth 2 (two) times daily.   acetaminophen 500 MG tablet Commonly known as: TYLENOL Take 500 mg by mouth at bedtime.   amoxicillin  500 MG tablet Commonly known as: AMOXIL Take 2,000 mg by mouth as needed. Take 1 hour prior to dental appointment.   BENEFIBER PO Take 4 scoop by mouth daily.   Biofreeze 10 % Aero Generic drug: Menthol Apply topically in the morning, at noon, in the evening, and at bedtime. Apply cream to right side of neck.   CALCIUM 600 PO Take 1and 1/2  Tablet = 900 mg in the evening with dinner .   calcium carbonate 1500 (600 Ca) MG Tabs tablet Commonly known as: OSCAL Take 1,500 mg by mouth daily. OTC   Claritin 10 MG tablet Generic drug: loratadine Take 10 mg by mouth daily.   dextromethorphan-guaiFENesin 30-600 MG 12hr tablet Commonly known as: MUCINEX DM Take 1 tablet by mouth 2 (two) times daily as needed for cough.   diltiazem 120 MG 24 hr capsule Commonly known as: CARDIZEM CD Take 1 capsule (120 mg total) by mouth daily.   enalapril 20 MG tablet Commonly known as: VASOTEC Take 20 mg by mouth 2 (two) times daily.   furosemide 20 MG tablet Commonly known as: LASIX Take by mouth daily. Take 60 mg   Magnesium Oxide 250 MG Tabs Take 250 mg by mouth daily.   metoprolol succinate 50 MG 24 hr tablet Commonly known as: TOPROL-XL Take 50 mg by mouth. Give with Metoprolol 100mg  Once A Day   Metoprolol Succinate 100 MG Cs24 Take 150 mg by mouth at bedtime.   OXYGEN Inhale 2 L into the lungs daily as needed (shortness of breath). Use 2 liter as needed for SOB, 02 sat < 90% on room air   Pain Relief Maximum Strength 4 % Ptch Generic drug: Lidocaine Apply 1 patch topically every 12 (twelve) hours as needed. Apply patch to right side of neck on for 12 hours and off 12 hours at night.   potassium chloride SA 20 MEQ tablet Commonly known as: KLOR-CON Take 30 mEq by mouth daily.   predniSONE 1 MG tablet Commonly known as: DELTASONE Take 4 mg by mouth daily with breakfast.   Rewetting Drops Soln 1 drop by Does not apply route every 6 (six) hours as needed.   vitamin C 500  MG tablet Commonly known as: ASCORBIC ACID Take 500 mg by mouth daily.   Xarelto 15 MG Tabs tablet Generic drug: Rivaroxaban Take 15 mg by mouth daily with supper.       Review of Systems  Constitutional: Negative for activity change, appetite change and fever.  HENT: Positive for hearing loss. Negative for congestion and voice change.   Eyes: Negative for visual disturbance.  Respiratory: Positive for shortness of breath. Negative for cough and wheezing.        DOE  Cardiovascular: Positive for leg swelling. Negative for chest pain.  Gastrointestinal: Negative for abdominal pain and constipation.  Genitourinary: Negative for difficulty urinating, dysuria and urgency.  Musculoskeletal: Positive for arthralgias, gait  problem and myalgias.  Skin: Positive for color change and wound.       Lateral left lower leg  Neurological: Negative for speech difficulty, weakness and light-headedness.       Memory lapses.   Psychiatric/Behavioral: Negative for behavioral problems and sleep disturbance. The patient is not nervous/anxious.     Immunization History  Administered Date(s) Administered  . Influenza Whole 09/14/2018  . Influenza, High Dose Seasonal PF 09/15/2019  . Influenza-Unspecified 09/13/2015, 09/29/2016, 10/03/2017, 09/23/2020  . Moderna SARS-COVID-2 Vaccination 12/15/2019, 01/12/2020  . Pneumococcal Conjugate-13 10/03/2017  . Pneumococcal-Unspecified 05/14/2015  . Tdap 04/13/2011   Pertinent  Health Maintenance Due  Topic Date Due  . INFLUENZA VACCINE  Completed  . DEXA SCAN  Completed  . PNA vac Low Risk Adult  Completed   Fall Risk  09/01/2018 08/23/2017 08/12/2016  Falls in the past year? No No No   Functional Status Survey:    Vitals:   10/08/20 1114  BP: (!) 142/80  Pulse: 76  Resp: 20  Temp: (!) 96.8 F (36 C)  SpO2: 98%  Weight: 155 lb 3.2 oz (70.4 kg)  Height: 5\' 7"  (1.702 m)   Body mass index is 24.31 kg/m. Physical Exam Vitals and nursing note  reviewed.  Constitutional:      Appearance: Normal appearance. She is obese.  HENT:     Head: Normocephalic and atraumatic.     Mouth/Throat:     Mouth: Mucous membranes are moist.  Eyes:     Extraocular Movements: Extraocular movements intact.     Conjunctiva/sclera: Conjunctivae normal.     Pupils: Pupils are equal, round, and reactive to light.  Cardiovascular:     Rate and Rhythm: Normal rate. Rhythm irregular.     Heart sounds: No murmur heard.   Pulmonary:     Breath sounds: Rales present.     Comments: Bibasilar rales.  Abdominal:     General: Bowel sounds are normal.     Palpations: Abdomen is soft.     Tenderness: There is no abdominal tenderness. There is no rebound.  Musculoskeletal:     Cervical back: Normal range of motion.     Right lower leg: Edema present.     Left lower leg: Edema present.     Comments: Trace edema BLE. Limited ROM R+L shoulders.   Skin:    General: Skin is warm and dry.     Comments: Chronic venous insufficiency skin changes BLE. Lateral left lower leg skin tear area is well approximated with steri strips, most of the left lateral lower leg redness, warmth, tenderness, more swelling noted.   Neurological:     General: No focal deficit present.     Mental Status: She is alert. Mental status is at baseline.     Gait: Gait abnormal.     Comments: Oriented to person, place.   Psychiatric:        Mood and Affect: Mood normal.        Behavior: Behavior normal.        Thought Content: Thought content normal.        Judgment: Judgment normal.     Labs reviewed: Recent Labs    02/14/20 0000 05/21/20 0000 06/17/20 0000 09/10/20 0000  NA 141 139  --  138  K 3.7 4.1  --  4.2  CL 103 104  --  101  CO2 31* 25*  --  28*  BUN 22* 24*  --  21  CREATININE 0.8 0.9  --  0.9  CALCIUM 9.7 9.5  --  9.5  MG  --   --  2.1  --    Recent Labs    02/14/20 0000 05/21/20 0000  AST 22 21  ALT 14 15  ALKPHOS 45 54  ALBUMIN 3.9 4.2   Recent Labs     02/14/20 0000 05/21/20 0000  WBC 4.7 5.6  NEUTROABS 3,173 3,478  HGB 13.1 13.4  HCT 38 40  PLT 132* 140*   Lab Results  Component Value Date   TSH 1.82 03/07/2018   Lab Results  Component Value Date   HGBA1C 5.4 05/17/2017   Lab Results  Component Value Date   CHOL 148 02/14/2020   HDL 57 02/14/2020   LDLCALC 71 02/14/2020   TRIG 121 02/14/2020   CHOLHDL 2.4 05/04/2016    Significant Diagnostic Results in last 30 days:  No results found.  Assessment/Plan: Cellulitis of leg without foot, left  left lateral leg swelling, redness, tenderness, and warmth developed from previous skin tear where Steri strips are intact.  Will treat with Doxycycline 100mg  bid along with FloraStor bid x 7 days. Observe.   Chronic diastolic CHF (congestive heart failure), NYHA class 3 (HCC) CHF/edema BLE, on Furosemide 60mg  qd. EF 65% 2017, pend f/u BMP  Atrial fibrillation (HCC) Afib, heart rate is in control, on Diltiazem 120mg  qd, Metoprolol 150mg  qd, Xarelto 15mg  qd. Pending f/u CBC   Essential hypertension HTN, blood pressure is controlled, on Enalapril 20mg  bid along with Metoprolol and Diltiazem.    Rheumatoid arthritis (HCC) RA, pain, multiples sites, on Prednisone 4mg  qd, Tylenol 1000mg  bid, 500mg  qd. f/u Rheumatology     Family/ staff Communication: plan of care reviewed with the patient and charge nurse.   Labs/tests ordered:  None  Time spend 40 minutes.

## 2020-10-08 NOTE — Assessment & Plan Note (Signed)
RA, pain, multiples sites, on Prednisone 4mg  qd, Tylenol 1000mg  bid, 500mg  qd. f/u Rheumatology

## 2020-10-08 NOTE — Assessment & Plan Note (Signed)
HTN, blood pressure is controlled, on Enalapril 20mg  bid along with Metoprolol and Diltiazem.

## 2020-10-08 NOTE — Assessment & Plan Note (Signed)
left lateral leg swelling, redness, tenderness, and warmth developed from previous skin tear where Steri strips are intact.  Will treat with Doxycycline 100mg  bid along with FloraStor bid x 7 days. Observe.

## 2020-10-08 NOTE — Assessment & Plan Note (Signed)
Afib, heart rate is in control, on Diltiazem 120mg  qd, Metoprolol 150mg  qd, Xarelto 15mg  qd. Pending f/u CBC

## 2020-10-09 ENCOUNTER — Encounter: Payer: Self-pay | Admitting: Nurse Practitioner

## 2020-10-16 ENCOUNTER — Encounter: Payer: Self-pay | Admitting: Nurse Practitioner

## 2020-10-16 ENCOUNTER — Non-Acute Institutional Stay: Payer: PPO | Admitting: Nurse Practitioner

## 2020-10-16 DIAGNOSIS — I10 Essential (primary) hypertension: Secondary | ICD-10-CM

## 2020-10-16 DIAGNOSIS — M0579 Rheumatoid arthritis with rheumatoid factor of multiple sites without organ or systems involvement: Secondary | ICD-10-CM

## 2020-10-16 DIAGNOSIS — I5032 Chronic diastolic (congestive) heart failure: Secondary | ICD-10-CM | POA: Diagnosis not present

## 2020-10-16 DIAGNOSIS — I4811 Longstanding persistent atrial fibrillation: Secondary | ICD-10-CM | POA: Diagnosis not present

## 2020-10-16 NOTE — Assessment & Plan Note (Signed)
HTN, blood pressure is controlled, on Enalapril 20mg  bid along with Metoprolol and Diltiazem.

## 2020-10-16 NOTE — Assessment & Plan Note (Signed)
RA, pain, multiples sites, on Prednisone 4mg  qd, Tylenol 1000mg  bid, 500mg  qd.f/u Rheumatology

## 2020-10-16 NOTE — Progress Notes (Signed)
Location:    [facility] Room Number: 951 Place of Service:  ALF [phone]) Provider: Lennie Odor Maddalena Linarez NP  Virgie Dad, MD  Patient Care Team: Virgie Dad, MD as PCP - General (Internal Medicine) Uzair Godley X, NP as Nurse Practitioner (Internal Medicine) Martinique, Peter M, MD as Consulting Physician (Cardiology) Lavonna Monarch, MD as Consulting Physician (Dermatology) Garald Balding, MD as Consulting Physician (Orthopedic Surgery)  Extended Emergency Contact Information Primary Emergency Contact: Berneta Levins Address: [address] [place], [place] [address] Johnnette Litter of [place] Phone: [phone] Work Phone: [phone] Mobile Phone: [phone] Relation: Daughter Secondary Emergency Contact: Azalee Course [place] of [place] Phone: [phone] Mobile Phone: [phone] Relation: Son  Code Status: DNR Goals of care: Advanced Directive information Advanced Directives 10/08/2020  Does Patient Have a Medical Advance Directive? Yes  Type of Paramedic of [place];Living will  Does patient want to make changes to medical advance directive? No - Patient declined  Copy of [place] in Chart? Yes - validated most recent copy scanned in chart (See row information)  Would patient like information on creating a medical advance directive? -  Pre-existing out of facility DNR order (yellow form or pink MOST form) -     Chief Complaint  Patient presents with   Acute Visit    Persisted swelling LLE     HPI:  Pt is a 86 y.o. female seen today for an acute visit for persisted mild redness, warmth, swelling left lower leg, skin tear is well approximated with steri strips lateral lower left leg, no purulent drainage. The patient feels pain comes and goes in her calf/LLE. Completed 7 day course Doxycycline for cellulitis LLE.  CHF/edema BLE, on Furosemide 60mg  qd. EF 65% 2017              Afib, heart rate is in control, on Diltiazem 120mg  qd, Metoprolol 150mg  qd, Xarelto 15mg  qd.              HTN, blood pressure is controlled, on Enalapril 20mg  bid along with Metoprolol and Diltiazem.              RA, pain, multiples sites, on Prednisone 4mg  qd, Tylenol 1000mg  bid, 500mg  qd.f/u Rheumatology   Past Medical History:  Diagnosis Date   Cervicalgia 08/12/2016   CHF (congestive heart failure) (HCC)    Chronic diastolic CHF (congestive heart failure), NYHA class 3 ([place]) 03/20/5461   Complication of anesthesia    Edema 08/12/2016   Hypertension    New onset atrial fibrillation ([place]) 04/2016   a. started on Xarelto and Cardizem   PONV (postoperative nausea and vomiting)    RA (rheumatoid arthritis) (HCC)    SCC (squamous cell carcinoma) KA 09/15/2016   Left forearm - tx p bx   SOB (shortness of breath) 04/2016   Hospitalist   Vertigo    Past Surgical History:  Procedure Laterality Date   Biopsy right breast Right    fibrocystic changes   CARDIOVERSION N/A 07/08/2016   Procedure: CARDIOVERSION;  Surgeon: Larey Dresser, MD;  Location: [facility];  Service: Cardiovascular;  Laterality: N/A;   CERVICAL SPINE SURGERY  2005   Dr. Vertell Limber   HYSTEROSCOPY  2001   JOINT REPLACEMENT     REPLACEMENT TOTAL KNEE Right 1996   [place] MD   SHOULDER SURGERY Bilateral [id number]   Baird Lyons MD  Allergies  Allergen Reactions   Morphine Sulfate Other (See Comments)    "extremely nauseated"   Other Nausea And Vomiting    Pt states she is very sensitive to pain medications.    Allergies as of 10/16/2020      Reactions   Morphine Sulfate Other (See Comments)   "extremely nauseated"   Other Nausea And Vomiting   Pt states she is very sensitive to pain medications.      Medication List       Accurate as of October 16, 2020 11:59 PM. If you have any questions, ask your nurse or doctor.        acetaminophen 500 MG tablet Commonly known  as: TYLENOL Take 1,000 mg by mouth 2 (two) times daily.   acetaminophen 500 MG tablet Commonly known as: TYLENOL Take 500 mg by mouth at bedtime.   amoxicillin 500 MG tablet Commonly known as: AMOXIL Take 2,000 mg by mouth as needed. Take 1 hour prior to dental appointment.   BENEFIBER PO Take 4 scoop by mouth daily.   Biofreeze 10 % Aero Generic drug: Menthol Apply topically in the morning, at noon, in the evening, and at bedtime. Apply cream to right side of neck.   CALCIUM 600 PO Take 1and 1/2  Tablet = 900 mg in the evening with dinner .   calcium carbonate 1500 (600 Ca) MG Tabs tablet Commonly known as: OSCAL Take 1,500 mg by mouth daily. OTC   Claritin 10 MG tablet Generic drug: loratadine Take 10 mg by mouth daily.   dextromethorphan-guaiFENesin 30-600 MG 12hr tablet Commonly known as: MUCINEX DM Take 1 tablet by mouth 2 (two) times daily as needed for cough.   diltiazem 120 MG 24 hr capsule Commonly known as: CARDIZEM CD Take 1 capsule (120 mg total) by mouth daily.   enalapril 20 MG tablet Commonly known as: VASOTEC Take 20 mg by mouth 2 (two) times daily.   furosemide 20 MG tablet Commonly known as: LASIX Take by mouth daily. Take 60 mg   Magnesium Oxide 250 MG Tabs Take 250 mg by mouth daily.   metolazone 2.5 MG tablet Commonly known as: ZAROXOLYN Take 2.5 mg by mouth daily.   metoprolol succinate 50 MG 24 hr tablet Commonly known as: TOPROL-XL Take 50 mg by mouth. Give with Metoprolol 100mg  Once A Day   Metoprolol Succinate 100 MG Cs24 Take 150 mg by mouth at bedtime.   OXYGEN Inhale 2 L into the lungs daily as needed (shortness of breath). Use 2 liter as needed for SOB, 02 sat < 90% on room air   Pain Relief Maximum Strength 4 % Ptch Generic drug: Lidocaine Apply 1 patch topically every 12 (twelve) hours as needed. Apply patch to right side of neck on for 12 hours and off 12 hours at night.   potassium chloride SA 20 MEQ tablet Commonly  known as: KLOR-CON Take 30 mEq by mouth daily.   predniSONE 1 MG tablet Commonly known as: DELTASONE Take 4 mg by mouth daily with breakfast.   Rewetting Drops Soln 1 drop by Does not apply route every 6 (six) hours as needed.   vitamin C 500 MG tablet Commonly known as: ASCORBIC ACID Take 500 mg by mouth daily.   Xarelto 15 MG Tabs tablet Generic drug: Rivaroxaban Take 15 mg by mouth daily with supper.       Review of Systems  Constitutional: Negative for appetite change, fatigue and fever.  HENT: Positive for hearing loss.  Negative for congestion and voice change.   Eyes: Negative for visual disturbance.  Respiratory: Positive for shortness of breath. Negative for cough and wheezing.        DOE  Cardiovascular: Positive for leg swelling. Negative for chest pain.  Gastrointestinal: Negative for abdominal pain and constipation.  Genitourinary: Negative for difficulty urinating, dysuria and urgency.  Musculoskeletal: Positive for arthralgias, gait problem and myalgias.  Skin: Positive for color change and wound.       Lateral left lower leg  Neurological: Negative for speech difficulty, weakness and light-headedness.       Memory lapses.   Psychiatric/Behavioral: Negative for behavioral problems and sleep disturbance. The patient is not nervous/anxious.     Immunization History  Administered Date(s) Administered   Influenza Whole 09/14/2018   Influenza, High Dose Seasonal PF 09/15/2019   Influenza-Unspecified 09/13/2015, 09/29/2016, 10/03/2017, 09/23/2020   Moderna SARS-COVID-2 Vaccination 12/15/2019, 01/12/2020   Pneumococcal Conjugate-13 10/03/2017   Pneumococcal-Unspecified 05/14/2015   Tdap 04/13/2011   Pertinent  Health Maintenance Due  Topic Date Due   INFLUENZA VACCINE  Completed   DEXA SCAN  Completed   PNA vac Low Risk Adult  Completed   Fall Risk  09/01/2018 08/23/2017 08/12/2016  Falls in the past year? No No No   Functional Status Survey:     Vitals:   10/16/20 1600  BP: 136/72  Pulse: 82  Resp: 18  Temp: (!) 97.2 F (36.2 C)  SpO2: 96%  Weight: 156 lb 9.6 oz (71 kg)  Height: 5\' 7"  (1.702 m)   Body mass index is 24.53 kg/m. Physical Exam Vitals and nursing note reviewed.  Constitutional:      Appearance: Normal appearance.  HENT:     Head: Normocephalic and atraumatic.     Mouth/Throat:     Mouth: Mucous membranes are moist.  Eyes:     Extraocular Movements: Extraocular movements intact.     Conjunctiva/sclera: Conjunctivae normal.     Pupils: Pupils are equal, round, and reactive to light.  Cardiovascular:     Rate and Rhythm: Normal rate. Rhythm irregular.     Heart sounds: No murmur heard.   Pulmonary:     Breath sounds: Rales present.     Comments: Bibasilar rales.  Abdominal:     General: Bowel sounds are normal.     Palpations: Abdomen is soft.     Tenderness: There is no abdominal tenderness.  Musculoskeletal:     Cervical back: Normal range of motion.     Right lower leg: Edema present.     Left lower leg: Edema present.     Comments: 2+LLE edema, trace edema RLE. Limited ROM R+L shoulders.   Skin:    General: Skin is warm and dry.     Comments: Chronic venous insufficiency skin changes BLE. Lateral left lower leg skin tear area is well approximated with steri strips, healing nicely, persisted most of the left lateral lower leg redness, warmth, swelling, only pain in the calf comes/goes.   Neurological:     General: No focal deficit present.     Mental Status: She is alert. Mental status is at baseline.     Gait: Gait abnormal.     Comments: Oriented to person, place.   Psychiatric:        Mood and Affect: Mood normal.        Behavior: Behavior normal.        Thought Content: Thought content normal.        Judgment:  Judgment normal.     Labs reviewed: Recent Labs    02/14/20 0000 05/21/20 0000 06/17/20 0000 09/10/20 0000  NA 141 139  --  138  K 3.7 4.1  --  4.2  CL 103 104  --   101  CO2 31* 25*  --  28*  BUN 22* 24*  --  21  CREATININE 0.8 0.9  --  0.9  CALCIUM 9.7 9.5  --  9.5  MG  --   --  2.1  --    Recent Labs    02/14/20 0000 05/21/20 0000  AST 22 21  ALT 14 15  ALKPHOS 45 54  ALBUMIN 3.9 4.2   Recent Labs    02/14/20 0000 05/21/20 0000  WBC 4.7 5.6  NEUTROABS 3,173 3,478  HGB 13.1 13.4  HCT 38 40  PLT 132* 140*   Lab Results  Component Value Date   TSH 1.82 03/07/2018   Lab Results  Component Value Date   HGBA1C 5.4 05/17/2017   Lab Results  Component Value Date   CHOL 148 02/14/2020   HDL 57 02/14/2020   LDLCALC 71 02/14/2020   TRIG 121 02/14/2020   CHOLHDL 2.4 05/04/2016    Significant Diagnostic Results in last 30 days:  No results found.  Assessment/Plan: Chronic diastolic CHF (congestive heart failure), NYHA class 3 (HCC)  persisted mild redness, warmth, swelling left lower leg, skin tear is well approximated with steri strips lateral lower left leg, no purulent drainage. The patient feels pain comes and goes in her calf/LLE. Completed 7 day course Doxycycline for cellulitis LLE.  CHF/edema BLE, on Furosemide 60mg  qd. EF 65% 2017  Will obtain venous US LLE to r/o DVT, adding Metolazone 2.5mg  qd x 3days, update BMP   Atrial fibrillation (HCC) Afib, heart rate is in control, on Diltiazem 120mg  qd, Metoprolol 150mg  qd, Xarelto 15mg  qd.   Essential hypertension HTN, blood pressure is controlled, on Enalapril 20mg  bid along with Metoprolol and Diltiazem.    Rheumatoid arthritis (HCC) RA, pain, multiples sites, on Prednisone 4mg  qd, Tylenol 1000mg  bid, 500mg  qd.f/u Rheumatology     Family/ staff Communication: plan of care reviewed with the patient and charge nurse.   Labs/tests ordered:  Venous US LLE. BMP next week  Time spend 40 minutes.

## 2020-10-16 NOTE — Assessment & Plan Note (Signed)
persisted mild redness, warmth, swelling left lower leg, skin tear is well approximated with steri strips lateral lower left leg, no purulent drainage. The patient feels pain comes and goes in her calf/LLE. Completed 7 day course Doxycycline for cellulitis LLE.  CHF/edema BLE, on Furosemide 60mg  qd. EF 65% 2017  Will obtain venous US LLE to r/o DVT, adding Metolazone 2.5mg  qd x 3days, update BMP

## 2020-10-16 NOTE — Assessment & Plan Note (Signed)
Afib, heart rate is in control, on Diltiazem 120mg  qd, Metoprolol 150mg  qd, Xarelto 15mg  qd.

## 2020-10-17 ENCOUNTER — Encounter: Payer: Self-pay | Admitting: Nurse Practitioner

## 2020-10-17 DIAGNOSIS — R601 Generalized edema: Secondary | ICD-10-CM | POA: Diagnosis not present

## 2020-10-21 DIAGNOSIS — I5032 Chronic diastolic (congestive) heart failure: Secondary | ICD-10-CM | POA: Diagnosis not present

## 2020-10-21 DIAGNOSIS — R58 Hemorrhage, not elsewhere classified: Secondary | ICD-10-CM | POA: Diagnosis not present

## 2020-10-21 LAB — BASIC METABOLIC PANEL
BUN: 37 — AB (ref 4–21)
CO2: 32 — AB (ref 13–22)
Chloride: 97 — AB (ref 99–108)
Creatinine: 1.2 — AB (ref 0.5–1.1)
Glucose: 91
Potassium: 3.8 (ref 3.4–5.3)
Sodium: 138 (ref 137–147)

## 2020-10-21 LAB — COMPREHENSIVE METABOLIC PANEL: Calcium: 10.2 (ref 8.7–10.7)

## 2020-10-23 ENCOUNTER — Ambulatory Visit: Payer: PPO | Admitting: Physician Assistant

## 2020-10-23 ENCOUNTER — Encounter: Payer: Self-pay | Admitting: Physician Assistant

## 2020-10-23 VITALS — BP 115/57 | HR 85 | Wt 153.0 lb

## 2020-10-23 DIAGNOSIS — I5032 Chronic diastolic (congestive) heart failure: Secondary | ICD-10-CM

## 2020-10-23 DIAGNOSIS — I10 Essential (primary) hypertension: Secondary | ICD-10-CM | POA: Diagnosis not present

## 2020-10-23 DIAGNOSIS — I4821 Permanent atrial fibrillation: Secondary | ICD-10-CM

## 2020-10-23 DIAGNOSIS — I4819 Other persistent atrial fibrillation: Secondary | ICD-10-CM

## 2020-10-23 NOTE — Patient Instructions (Signed)
Medication Instructions:  Your physician recommends that you continue on your current medications as directed. Please refer to the Current Medication list given to you today.  *If you need a refill on your cardiac medications before your next appointment, please call your pharmacy*  Lab Work:  Please Fax last BMET result to [phone] Attn: Almyra Deforest, PA-C  If you have labs (blood work) drawn today and your tests are completely normal, you will receive your results only by: Marland Kitchen MyChart Message (if you have MyChart) OR . A paper copy in the mail If you have any lab test that is abnormal or we need to change your treatment, we will call you to review the results.  Testing/Procedures:  Please Fax las Venous Doppler report to [phone] Attn: Almyra Deforest, PA-C  Follow-Up: At [facility], you and your health needs are our priority.  As part of our continuing mission to provide you with exceptional heart care, we have created designated Provider Care Teams.  These Care Teams include your primary Cardiologist (physician) and Advanced Practice Providers (APPs -  Physician Assistants and Nurse Practitioners) who all work together to provide you with the care you need, when you need it.  We recommend signing up for the patient portal called "MyChart".  Sign up information is provided on this After Visit Summary.  MyChart is used to connect with patients for Virtual Visits (Telemedicine).  Patients are able to view lab/test results, encounter notes, upcoming appointments, etc.  Non-urgent messages can be sent to your provider as well.   To learn more about what you can do with MyChart, go to [email or web address].    Your next appointment:   1 year(s)  The format for your next appointment:   In Person  Provider:   Peter Martinique, MD  Other Instructions

## 2020-10-23 NOTE — Progress Notes (Signed)
Cardiology Office Note:    Date:  10/25/2020   ID:  [name], DOB [birth year], MRN [id number]  PCP:  Virgie Dad, MD  [facility] Cardiologist:  Peter Martinique, MD  [facility] Electrophysiologist:  None   Referring MD: Virgie Dad, MD   Chief Complaint  Patient presents with  . Follow-up    seen for Dr. Martinique    History of Present Illness:    [name] is a 86 y.o. female with a hx of chronic diastolic heart failure, hypertension, permanent A. 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  She was diagnosed with atrial fibrillation in the heart failure in May 2017.  She underwent diuresis and was placed on Xarelto.  Beta-blocker dosage was adjusted.  She eventually underwent outpatient DCCV in July 2017 however failed to hold sinus rhythm and went back to A. fib by the end of August.  Lasix was increased in the fall 2017 due to worsening leg edema.  Echocardiogram showed normal LV function, biatrial enlargement, pulmonary hypertension.  She lives at [facility].  She was last seen by Dr. Martinique virtually in November 2020 at which time she was doing well.  More recently, she was seen by primary care due to lower extremity cellulitis and completed a 7-day course of doxycycline.  Metolazone 2.5 mg was added for 3 days.  Patient was instructed to obtain lower extremity venous Doppler to rule out DVT.  Patient presents to cardiology office visit along with her daughter.  She was not aware of the recent lab results or the lower extremity Doppler result.  She does have trace amount of left lower extremity redness, some of this could be due to venous stasis.  I do not feel significant heat coming from the area.  She denies any significant shortness of breath or or chest discomfort.  According to her daughter, she is quite sedentary and essentially wheelchair-bound at this point.  Overall, she is stable from cardiac perspective, heart rate is very well controlled.  She  can follow-up in 1 year.   Past Medical History:  Diagnosis Date  . Cervicalgia 08/12/2016  . CHF (congestive heart failure) ([place])   . Chronic diastolic CHF (congestive heart failure), NYHA class 3 ([place]) 06/17/2016  . Complication of anesthesia   . Edema 08/12/2016  . Hypertension   . New onset atrial fibrillation ([place]) 04/2016   a. started on Xarelto and Cardizem  . PONV (postoperative nausea and vomiting)   . RA (rheumatoid arthritis) ([place])   . SCC (squamous cell carcinoma) KA 09/15/2016   Left forearm - tx p bx  . SOB (shortness of breath) 04/2016   Hospitalist  . Vertigo     Past Surgical History:  Procedure Laterality Date  . Biopsy right breast Right    fibrocystic changes  . CARDIOVERSION N/A 07/08/2016   Procedure: CARDIOVERSION;  Surgeon: Larey Dresser, MD;  Location: [facility];  Service: Cardiovascular;  Laterality: N/A;  . CERVICAL SPINE SURGERY  2005   Dr. Vertell Limber  . HYSTEROSCOPY  2001  . JOINT REPLACEMENT    . REPLACEMENT TOTAL KNEE Right 1996   [place] MD  . SHOULDER SURGERY Bilateral 2005,2008,2009   Baird Lyons MD    Current Medications: Current Meds  Medication Sig  . acetaminophen (TYLENOL) 500 MG tablet Take 1,000 mg by mouth 2 (two) times daily.   Marland Kitchen acetaminophen (TYLENOL) 500 MG tablet Take 500 mg by mouth at bedtime.  Marland Kitchen amoxicillin (AMOXIL)  500 MG tablet Take 2,000 mg by mouth as needed. Take 1 hour prior to dental appointment.   . Calcium Carbonate (CALCIUM 600 PO) Take 1and 1/2  Tablet = 900 mg in the evening with dinner .  . calcium carbonate (OSCAL) 1500 (600 Ca) MG TABS tablet Take 1,500 mg by mouth daily. OTC  . dextromethorphan-guaiFENesin (MUCINEX DM) 30-600 MG 12hr tablet Take 1 tablet by mouth 2 (two) times daily as needed for cough.  . diltiazem (CARDIZEM CD) 120 MG 24 hr capsule Take 1 capsule (120 mg total) by mouth daily.  . enalapril (VASOTEC) 20 MG tablet Take 20 mg by mouth 2 (two) times daily.    . furosemide (LASIX) 20  MG tablet Take by mouth daily. Take 60 mg  . Lidocaine (PAIN RELIEF MAXIMUM STRENGTH) 4 % PTCH Apply 1 patch topically every 12 (twelve) hours as needed. Apply patch to right side of neck on for 12 hours and off 12 hours at night.  . loratadine (CLARITIN) 10 MG tablet Take 10 mg by mouth daily.  . Magnesium Oxide 250 MG TABS Take 250 mg by mouth daily.  . Menthol (BIOFREEZE) 10 % AERO Apply topically in the morning, at noon, in the evening, and at bedtime. Apply cream to right side of neck.   . metoprolol succinate (TOPROL-XL) 50 MG 24 hr tablet Take 50 mg by mouth. Give with Metoprolol 100mg  Once A Day  . Metoprolol Succinate 100 MG CS24 Take 150 mg by mouth at bedtime.  . OXYGEN Inhale 2 L into the lungs daily as needed (shortness of breath). Use 2 liter as needed for SOB, 02 sat < 90% on room air   . potassium chloride SA (K-DUR,KLOR-CON) 20 MEQ tablet Take 30 mEq by mouth daily.   . predniSONE (DELTASONE) 1 MG tablet Take 4 mg by mouth daily with breakfast.  . Rivaroxaban (XARELTO) 15 MG TABS tablet Take 15 mg by mouth daily with supper.  . Soft Lens Products (REWETTING DROPS) SOLN 1 drop by Does not apply route every 6 (six) hours as needed.  . vitamin C (ASCORBIC ACID) 500 MG tablet Take 500 mg by mouth daily.    . Wheat Dextrin (BENEFIBER PO) Take 4 scoop by mouth daily.      Allergies:   Morphine sulfate and Other   Social History   Socioeconomic History  . Marital status: Widowed    Spouse name: Not on file  . Number of children: Not on file  . Years of education: Not on file  . Highest education level: Not on file  Occupational History  . Occupation: Retired  Tobacco Use  . Smoking status: Former Smoker    Quit date: 12/13/1966    Years since quitting: 53.9  . Smokeless tobacco: Never Used  Vaping Use  . Vaping Use: Never used  Substance and Sexual Activity  . Alcohol use: No  . Drug use: No  . Sexual activity: Never  Other Topics Concern  . Not on file  Social  History Narrative   Living in [place] since 05/09/2016   Widowed   Former smoked -stopped 1968   Alcohol none   Exercise -none. Sits in wheelchair using her feet to move around   DNR, POA, Living will   Social Determinants of Health   Financial Resource Strain:   . Difficulty of Paying Living Expenses: Not on file  Food Insecurity:   . Worried About Charity fundraiser in the Last Year:  Not on file  . Ran Out of Food in the Last Year: Not on file  Transportation Needs:   . Lack of Transportation (Medical): Not on file  . Lack of Transportation (Non-Medical): Not on file  Physical Activity:   . Days of Exercise per Week: Not on file  . Minutes of Exercise per Session: Not on file  Stress:   . Feeling of Stress : Not on file  Social Connections:   . Frequency of Communication with Friends and Family: Not on file  . Frequency of Social Gatherings with Friends and Family: Not on file  . Attends Religious Services: Not on file  . Active Member of Clubs or Organizations: Not on file  . Attends Archivist Meetings: Not on file  . Marital Status: Not on file     Family History: The patient's family history includes Arthritis in her daughter and son; Cancer in her sister; Hypertension in her son; Hypothyroidism in her daughter.  ROS:   Please see the history of present illness.     All other systems reviewed and are negative.  EKGs/Labs/Other Studies Reviewed:    The following studies were reviewed today:  Echo 10/08/2016 LV EF: 60% -  65%   -------------------------------------------------------------------  Indications:   (I48.0).   -------------------------------------------------------------------  History:  PMH: Acquired from the patient and from the patient&'s  chart. Syncope. Atrial fibrillation. Risk factors: Former  tobacco use. Hypertension.   -------------------------------------------------------------------  Study Conclusions    - Left ventricle: The cavity size was normal. Systolic function was  normal. The estimated ejection fraction was in the range of 60%  to 65%. Wall motion was normal; there were no regional wall  motion abnormalities.  - Aortic valve: There was trivial regurgitation.  - Mitral valve: Calcified annulus. Mildly thickened leaflets .  There was mild regurgitation.  - Left atrium: The atrium was moderately dilated.  - Right atrium: The atrium was moderately dilated.  - Tricuspid valve: There was mild-moderate regurgitation. There was  evidence of perivalvular regurgitation.  - Pulmonary arteries: Systolic pressure was moderately increased.  PA peak pressure: 54 mm Hg (S).   EKG:  EKG is ordered today.  The ekg ordered today demonstrates atrial fibrillation, heart rate 85.  Poor R wave progression in the anterior leads.  Recent Labs: 05/21/2020: ALT 15; Hemoglobin 13.4; Platelets 140 06/17/2020: Magnesium 2.1 09/10/2020: BUN 21; Creatinine 0.9; Potassium 4.2; Sodium 138  Recent Lipid Panel    Component Value Date/Time   CHOL 148 02/14/2020 0000   TRIG 121 02/14/2020 0000   HDL 57 02/14/2020 0000   CHOLHDL 2.4 05/04/2016 0303   VLDL 13 05/04/2016 0303   LDLCALC 71 02/14/2020 0000     Risk Assessment/Calculations:     CHA2DS2-VASc Score = 5  This indicates a 7.2% annual risk of stroke. The patient's score is based upon: CHF History: 1 HTN History: 1 Diabetes History: 0 Stroke History: 0 Vascular Disease History: 0 Age Score: 2 Gender Score: 1      Physical Exam:    VS:  BP (!) 115/57   Pulse 85   Wt 153 lb (69.4 kg)   SpO2 94%   BMI 23.96 kg/m     Wt Readings from Last 3 Encounters:  10/23/20 153 lb (69.4 kg)  10/16/20 156 lb 9.6 oz (71 kg)  10/08/20 155 lb 3.2 oz (70.4 kg)     GEN:  Well nourished, well developed in no acute distress HEENT: Normal NECK: No JVD;  No carotid bruits LYMPHATICS: No lymphadenopathy CARDIAC: Irregularly irregular, no  murmurs, rubs, gallops RESPIRATORY:  Clear to auscultation without rales, wheezing or rhonchi  ABDOMEN: Soft, non-tender, non-distended MUSCULOSKELETAL:  No edema; No deformity  SKIN: Warm and dry NEUROLOGIC:  Alert and oriented x 3 PSYCHIATRIC:  Normal affect   ASSESSMENT:    1. Permanent atrial fibrillation ([place])   2. Chronic diastolic heart failure ([place])   3. Primary hypertension    PLAN:    In order of problems listed above:  1. Permanent atrial fibrillation: Heart rate very well controlled on the current therapy.  Continue long-acting diltiazem, high-dose metoprolol succinate and the Xarelto.  2. Chronic diastolic heart failure: Recently underwent a course of antibiotic for left lower extremity redness.  He was also given metolazone for 3 days only.  He had lab work afterward including basic metabolic panel and also a venous Doppler to rule out DVT as well.  We will request the record.  She appears to be euvolemic on physical exam  3. Hypertension: Continue on current therapy.    Shared Decision Making/Informed Consent      Medication Adjustments/Labs and Tests Ordered: Current medicines are reviewed at length with the patient today.  Concerns regarding medicines are outlined above.  Orders Placed This Encounter  Procedures  . EKG 12-Lead   No orders of the defined types were placed in this encounter.   Patient Instructions  Medication Instructions:  Your physician recommends that you continue on your current medications as directed. Please refer to the Current Medication list given to you today.  *If you need a refill on your cardiac medications before your next appointment, please call your pharmacy*  Lab Work:  Please Fax last BMET result to [phone] Attn: Almyra Deforest, PA-C  If you have labs (blood work) drawn today and your tests are completely normal, you will receive your results only by: Marland Kitchen MyChart Message (if you have MyChart) OR . A paper copy in the  mail If you have any lab test that is abnormal or we need to change your treatment, we will call you to review the results.  Testing/Procedures:  Please Fax las Venous Doppler report to [phone] Attn: Almyra Deforest, PA-C  Follow-Up: At [facility], you and your health needs are our priority.  As part of our continuing mission to provide you with exceptional heart care, we have created designated Provider Care Teams.  These Care Teams include your primary Cardiologist (physician) and Advanced Practice Providers (APPs -  Physician Assistants and Nurse Practitioners) who all work together to provide you with the care you need, when you need it.  We recommend signing up for the patient portal called "MyChart".  Sign up information is provided on this After Visit Summary.  MyChart is used to connect with patients for Virtual Visits (Telemedicine).  Patients are able to view lab/test results, encounter notes, upcoming appointments, etc.  Non-urgent messages can be sent to your provider as well.   To learn more about what you can do with MyChart, go to [email or web address].    Your next appointment:   1 year(s)  The format for your next appointment:   In Person  Provider:   Peter Martinique, MD  Other Instructions      Signed, Almyra Deforest, [place]  10/25/2020 11:42 PM    [place]

## 2020-10-25 ENCOUNTER — Encounter: Payer: Self-pay | Admitting: Physician Assistant

## 2020-12-25 ENCOUNTER — Encounter: Payer: Self-pay | Admitting: Nurse Practitioner

## 2020-12-25 ENCOUNTER — Non-Acute Institutional Stay: Payer: PPO | Admitting: Nurse Practitioner

## 2020-12-25 DIAGNOSIS — M542 Cervicalgia: Secondary | ICD-10-CM | POA: Diagnosis not present

## 2020-12-25 DIAGNOSIS — I5032 Chronic diastolic (congestive) heart failure: Secondary | ICD-10-CM

## 2020-12-25 DIAGNOSIS — I4821 Permanent atrial fibrillation: Secondary | ICD-10-CM | POA: Diagnosis not present

## 2020-12-25 DIAGNOSIS — M0579 Rheumatoid arthritis with rheumatoid factor of multiple sites without organ or systems involvement: Secondary | ICD-10-CM

## 2020-12-25 DIAGNOSIS — I872 Venous insufficiency (chronic) (peripheral): Secondary | ICD-10-CM | POA: Diagnosis not present

## 2020-12-25 DIAGNOSIS — I10 Essential (primary) hypertension: Secondary | ICD-10-CM

## 2020-12-25 NOTE — Progress Notes (Signed)
Location:   [facility] Room Number: 918 Place of Service:  ALF [phone]) Provider:  Veleta Miners MD  Virgie Dad, MD  Patient Care Team: Virgie Dad, MD as PCP - General (Internal Medicine) Martin[place], Peter M, MD as PCP - Cardiology (Cardiology) Tira Lafferty X, NP as Nurse Practitioner (Internal Medicine) [place], Peter M, MD as Consulting Physician (Cardiology) Lavonna Monarch, MD as Consulting Physician (Dermatology) Garald Balding, MD as Consulting Physician (Orthopedic Surgery)  Extended Emergency Contact Information Primary Emergency Contact: Berneta Levins Address: [address] [place], [place] [address] Johnnette Litter of [place] Phone: [phone] Work Phone: [phone] Mobile Phone: [phone] Relation: Daughter Secondary Emergency Contact: Azalee Course States of [address] Phone: [phone] Mobile Phone: [phone] Relation: Son  Code Status: DNR  Goals of care: Advanced Directive information Advanced Directives 10/08/2020  Does Patient Have a Medical Advance Directive? Yes  Type of Paramedic of [place];Living will  Does patient want to make changes to medical advance directive? No - Patient declined  Copy of [place] in Chart? Yes - validated most recent copy scanned in chart (See row information)  Would patient like information on creating a medical advance directive? -  Pre-existing out of facility DNR order (yellow form or pink MOST form) -     Chief Complaint  Patient presents with  . Medical Management of Chronic Issues    HPI:  Pt is a 87 y.o. female seen today for medical management of chronic diseases.    Chronic neck pain/cervicalgia, 08/14/16 X-ray C spine: anterior interbody fusion cervical spine, severe degenerative disc disease C7-T1, has shoulder prosthesis.  2019 right side neck pain, inj 02/05/19 Ortho 12/25/20 the patient declined  Gabapentin 100mg  qd.   Chronic venous insufficiency BLE, worsened in LLE, slow healing skin tear lateral upper left lower leg, mild erythema LLE  CHF/edema BLE, on Furosemide60mg  qd.EF 65% 2017. Bun/creat 37/1.2 10/21/20 Afib, heart rate is in control, on Diltiazem 120mg  qd, Metoprolol 150mg  qd, Xarelto 15mg  qd. Cardiology 10/23/20 HTN, blood pressure is controlled, on Enalapril 20mg  bid along with Metoprolol and Diltiazem.  RA, pain, multiples sites, on Prednisone 4mg  qd, Tylenol 1000mg  bid, 500mg  qd.f/u Rheumatology  Past Medical History:  Diagnosis Date  . Cervicalgia 08/12/2016  . CHF (congestive heart failure) ([place])   . Chronic diastolic CHF (congestive heart failure), NYHA class 3 ([facility]) 06/17/2016  . Complication of anesthesia   . Edema 08/12/2016  . Hypertension   . New onset atrial fibrillation ([place]) 04/2016   a. started on Xarelto and Cardizem  . PONV (postoperative nausea and vomiting)   . RA (rheumatoid arthritis) ([place])   . SCC (squamous cell carcinoma) KA 09/15/2016   Left forearm - tx p bx  . SOB (shortness of breath) 04/2016   Hospitalist  . Vertigo    Past Surgical History:  Procedure Laterality Date  . Biopsy right breast Right    fibrocystic changes  . CARDIOVERSION N/A 07/08/2016   Procedure: CARDIOVERSION;  Surgeon: Larey Dresser, MD;  Location: [facility];  Service: Cardiovascular;  Laterality: N/A;  . CERVICAL SPINE SURGERY  2005   Dr. Vertell Limber  . HYSTEROSCOPY  2001  . JOINT REPLACEMENT    . REPLACEMENT TOTAL KNEE Right 1996   [place] Bilateral [id number]   Baird Lyons MD    Allergies  Allergen Reactions  . Morphine Sulfate Other (  See Comments)    "extremely nauseated"  . Other Nausea And Vomiting    Pt states she is very sensitive to pain medications.    Allergies as of 12/25/2020      Reactions   Morphine Sulfate Other (See Comments)   "extremely nauseated"   Other Nausea  And Vomiting   Pt states she is very sensitive to pain medications.      Medication List       Accurate as of December 25, 2020 11:59 PM. If you have any questions, ask your nurse or doctor.        STOP taking these medications   doxycycline 100 MG tablet Commonly known as: VIBRA-TABS Stopped by: Mckensey Berghuis X Shanvi Moyd, NP     TAKE these medications   acetaminophen 500 MG tablet Commonly known as: TYLENOL Take 1,000 mg by mouth 2 (two) times daily.   acetaminophen 500 MG tablet Commonly known as: TYLENOL Take 500 mg by mouth at bedtime.   amoxicillin 500 MG tablet Commonly known as: AMOXIL Take 2,000 mg by mouth as needed. Take 1 hour prior to dental appointment.   BENEFIBER PO Take 4 scoop by mouth daily.   CALCIUM 600 PO Take 1and 1/2  Tablet = 900 mg in the evening with dinner .   calcium carbonate 1500 (600 Ca) MG Tabs tablet Commonly known as: OSCAL Take 1,500 mg by mouth daily. OTC   dextromethorphan-guaiFENesin 30-600 MG 12hr tablet Commonly known as: MUCINEX DM Take 1 tablet by mouth 2 (two) times daily as needed for cough.   diltiazem 120 MG 24 hr capsule Commonly known as: CARDIZEM CD Take 1 capsule (120 mg total) by mouth daily.   enalapril 20 MG tablet Commonly known as: VASOTEC Take 20 mg by mouth 2 (two) times daily.   furosemide 20 MG tablet Commonly known as: LASIX Take by mouth daily. Take 60 mg   loratadine 10 MG tablet Commonly known as: CLARITIN Take 10 mg by mouth daily.   Magnesium Oxide 250 MG Tabs Take 250 mg by mouth daily.   Menthol 10 % Aero Apply topically in the morning, at noon, in the evening, and at bedtime. Apply cream to right side of neck.   metolazone 2.5 MG tablet Commonly known as: ZAROXOLYN Take 2.5 mg by mouth daily.   metoprolol succinate 50 MG 24 hr tablet Commonly known as: TOPROL-XL Take 50 mg by mouth. Give with Metoprolol 100mg  Once A Day   Metoprolol Succinate 100 MG Cs24 Take 150 mg by mouth at bedtime.    OXYGEN Inhale 2 L into the lungs daily as needed (shortness of breath). Use 2 liter as needed for SOB, 02 sat < 90% on room air   Pain Relief Maximum Strength 4 % Ptch Generic drug: Lidocaine Apply 1 patch topically every 12 (twelve) hours as needed. Apply patch to right side of neck on for 12 hours and off 12 hours at night.   potassium chloride SA 20 MEQ tablet Commonly known as: KLOR-CON Take 30 mEq by mouth daily.   predniSONE 1 MG tablet Commonly known as: DELTASONE Take 4 mg by mouth daily with breakfast.   Rewetting Drops Soln 1 drop by Does not apply route every 6 (six) hours as needed.   Rivaroxaban 15 MG Tabs tablet Commonly known as: XARELTO Take 15 mg by mouth daily with supper.   vitamin C 500 MG tablet Commonly known as: ASCORBIC ACID Take 500 mg by mouth daily.  Review of Systems  Constitutional: Negative for fatigue, fever and unexpected weight change.  HENT: Positive for hearing loss. Negative for congestion and voice change.   Eyes: Negative for visual disturbance.  Respiratory: Positive for shortness of breath. Negative for cough and wheezing.        DOE  Cardiovascular: Positive for leg swelling. Negative for chest pain.  Gastrointestinal: Negative for abdominal pain and constipation.  Genitourinary: Negative for difficulty urinating, dysuria and urgency.  Musculoskeletal: Positive for arthralgias, gait problem and myalgias.  Skin: Positive for color change and wound.       Lateral left lower leg  Neurological: Negative for speech difficulty, weakness and light-headedness.       Memory lapses.   Psychiatric/Behavioral: Negative for behavioral problems and sleep disturbance. The patient is not nervous/anxious.     Immunization History  Administered Date(s) Administered  . Influenza Whole 09/14/2018  . Influenza, High Dose Seasonal PF 09/15/2019  . Influenza-Unspecified 09/13/2015, 09/29/2016, 10/03/2017, 09/23/2020  . Moderna Sars-Covid-2  Vaccination 12/15/2019, 01/12/2020  . Pneumococcal Conjugate-13 10/03/2017  . Pneumococcal-Unspecified 05/14/2015  . Tdap 04/13/2011   Pertinent  Health Maintenance Due  Topic Date Due  . INFLUENZA VACCINE  Completed  . DEXA SCAN  Completed  . PNA vac Low Risk Adult  Completed   Fall Risk  09/01/2018 08/23/2017 08/12/2016  Falls in the past year? No No No   Functional Status Survey:    Vitals:   12/25/20 1039  BP: 130/78  Pulse: 68  Resp: 18  Temp: 98.3 F (36.8 C)  SpO2: 94%  Weight: 153 lb 6.4 oz (69.6 kg)  Height: 5\' 7"  (1.702 m)   Body mass index is 24.03 kg/m. Physical Exam Vitals and nursing note reviewed.  Constitutional:      Appearance: Normal appearance.  HENT:     Head: Normocephalic and atraumatic.     Mouth/Throat:     Mouth: Mucous membranes are moist.  Eyes:     Extraocular Movements: Extraocular movements intact.     Conjunctiva/sclera: Conjunctivae normal.     Pupils: Pupils are equal, round, and reactive to light.  Cardiovascular:     Rate and Rhythm: Normal rate. Rhythm irregular.     Heart sounds: No murmur heard.   Pulmonary:     Breath sounds: Rales present.     Comments: Bibasilar rales.  Abdominal:     General: Bowel sounds are normal.     Palpations: Abdomen is soft.     Tenderness: There is no abdominal tenderness.  Musculoskeletal:     Cervical back: Normal range of motion.     Right lower leg: Edema present.     Left lower leg: Edema present.     Comments: 1+ LLE edema, trace edema RLE. Limited ROM R+L shoulders.   Skin:    General: Skin is warm and dry.     Comments: Chronic venous insufficiency skin changes BLE. Lateral left lower leg skin tear area is healing slowly, mild scaly erythema left ower leg redness, warmth, no pain in calf or LLE during my examination.   Neurological:     General: No focal deficit present.     Mental Status: She is alert. Mental status is at baseline.     Gait: Gait abnormal.     Comments:  Oriented to person, place.   Psychiatric:        Mood and Affect: Mood normal.        Behavior: Behavior normal.  Thought Content: Thought content normal.        Judgment: Judgment normal.     Labs reviewed: Recent Labs    05/21/20 0000 06/17/20 0000 09/10/20 0000 10/21/20 0000  NA 139  --  138 138  K 4.1  --  4.2 3.8  CL 104  --  101 97*  CO2 25*  --  28* 32*  BUN 24*  --  21 37*  CREATININE 0.9  --  0.9 1.2*  CALCIUM 9.5  --  9.5 10.2  MG  --  2.1  --   --    Recent Labs    02/14/20 0000 05/21/20 0000  AST 22 21  ALT 14 15  ALKPHOS 45 54  ALBUMIN 3.9 4.2   Recent Labs    02/14/20 0000 05/21/20 0000  WBC 4.7 5.6  NEUTROABS 3,173 3,478  HGB 13.1 13.4  HCT 38 40  PLT 132* 140*   Lab Results  Component Value Date   TSH 1.82 03/07/2018   Lab Results  Component Value Date   HGBA1C 5.4 05/17/2017   Lab Results  Component Value Date   CHOL 148 02/14/2020   HDL 57 02/14/2020   LDLCALC 71 02/14/2020   TRIG 121 02/14/2020   CHOLHDL 2.4 05/04/2016    Significant Diagnostic Results in last 30 days:  No results found.  Assessment/Plan Cervicalgia 08/14/16 X-ray C spine: anterior interbody fusion cervical spine, severe degenerative disc disease C7-T1, has shoulder prosthesis.  2019 right side neck pain, inj 02/05/19 Ortho 12/25/20 the patient declined Gabapentin 100mg  qd.   Chronic venous stasis dermatitis of left lower extremity Chronic venous insufficiency BLE, worsened in LLE, slow healing skin tear lateral upper left lower leg, mild erythema LLE, apply 1% Hydrocortisone cream bid x 14 days. Observe.   Chronic diastolic CHF (congestive heart failure), NYHA class 3 (HCC) CHF/edema BLE, on Furosemide60mg  qd.EF 65% 2017. Bun/creat 37/1.2 10/21/20  Atrial fibrillation (HCC) Afib, heart rate is in control, on Diltiazem 120mg  qd, Metoprolol 150mg  qd, Xarelto 15mg  qd.    Essential hypertension HTN, blood pressure is controlled, on Enalapril 20mg   bid along with Metoprolol and Diltiazem.    Rheumatoid arthritis (HCC) RA, pain, multiples sites, on Prednisone 4mg  qd, Tylenol 1000mg  bid, 500mg  qd.f/u Rheumatology       Family/ staff Communication: plan of care reviewed with the patient and charge nurse.   Labs/tests ordered:  none  Time spend 40 minutes.

## 2020-12-25 NOTE — Assessment & Plan Note (Signed)
RA, pain, multiples sites, on Prednisone 4mg qd, Tylenol 1000mg bid, 500mg qd. f/u Rheumatology  

## 2020-12-25 NOTE — Assessment & Plan Note (Signed)
Chronic venous insufficiency BLE, worsened in LLE, slow healing skin tear lateral upper left lower leg, mild erythema LLE, apply 1% Hydrocortisone cream bid x 14 days. Observe.

## 2020-12-25 NOTE — Assessment & Plan Note (Addendum)
08/14/16 X-ray C spine: anterior interbody fusion cervical spine, severe degenerative disc disease C7-T1, has shoulder prosthesis.  2019 right side neck pain, inj 02/05/19 Ortho 12/25/20 the patient declined Gabapentin 100mg  qd.

## 2020-12-25 NOTE — Assessment & Plan Note (Signed)
Afib, heart rate is in control, on Diltiazem 120mg qd, Metoprolol 150mg qd, Xarelto 15mg qd.  

## 2020-12-25 NOTE — Assessment & Plan Note (Signed)
CHF/edema BLE, on Furosemide60mg  qd.EF 65% 2017. Bun/creat 37/1.2 10/21/20

## 2020-12-25 NOTE — Assessment & Plan Note (Signed)
HTN, blood pressure is controlled, on Enalapril 20mg bid along with Metoprolol and Diltiazem.   

## 2020-12-26 ENCOUNTER — Encounter: Payer: Self-pay | Admitting: Nurse Practitioner

## 2021-01-20 DIAGNOSIS — L570 Actinic keratosis: Secondary | ICD-10-CM | POA: Diagnosis not present

## 2021-01-20 DIAGNOSIS — D485 Neoplasm of uncertain behavior of skin: Secondary | ICD-10-CM | POA: Diagnosis not present

## 2021-01-20 DIAGNOSIS — C44722 Squamous cell carcinoma of skin of right lower limb, including hip: Secondary | ICD-10-CM | POA: Diagnosis not present

## 2021-01-20 DIAGNOSIS — L814 Other melanin hyperpigmentation: Secondary | ICD-10-CM | POA: Diagnosis not present

## 2021-01-20 DIAGNOSIS — Z85828 Personal history of other malignant neoplasm of skin: Secondary | ICD-10-CM | POA: Diagnosis not present

## 2021-01-20 DIAGNOSIS — D229 Melanocytic nevi, unspecified: Secondary | ICD-10-CM | POA: Diagnosis not present

## 2021-02-03 ENCOUNTER — Encounter: Payer: Self-pay | Admitting: Internal Medicine

## 2021-02-03 ENCOUNTER — Non-Acute Institutional Stay: Payer: PPO | Admitting: Internal Medicine

## 2021-02-03 DIAGNOSIS — I4821 Permanent atrial fibrillation: Secondary | ICD-10-CM

## 2021-02-03 DIAGNOSIS — I5032 Chronic diastolic (congestive) heart failure: Secondary | ICD-10-CM | POA: Diagnosis not present

## 2021-02-03 DIAGNOSIS — M0579 Rheumatoid arthritis with rheumatoid factor of multiple sites without organ or systems involvement: Secondary | ICD-10-CM | POA: Diagnosis not present

## 2021-02-03 DIAGNOSIS — M542 Cervicalgia: Secondary | ICD-10-CM | POA: Diagnosis not present

## 2021-02-03 NOTE — Progress Notes (Signed)
Location: [facility] Room Number: 409 Place of Service:  ALF (13)  Provider:   Code Status:  Goals of Care:  Advanced Directives 10/08/2020  Does Patient Have a Medical Advance Directive? Yes  Type of Paramedic of [place];Living will  Does patient want to make changes to medical advance directive? No - Patient declined  Copy of [place] in Chart? Yes - validated most recent copy scanned in chart (See row information)  Would patient like information on creating a medical advance directive? -  Pre-existing out of facility DNR order (yellow form or pink MOST form) -     Chief Complaint  Patient presents with  . Acute Visit    Depression    HPI: Patient is a 87 y.o. female seen today for an acute visit for Depression and Neck Pain  Patient has h/o Chronic Atrial Fibrillation on Xarelto, Hypertension, Diastolic CHF, Rheumatoid arthritis on Chronic Prednisone  Lives in [facility] Nurses wanted her to be evaluated for Neck pain and depression They had noticed her calling for help when she is having severe Pain  Patient adamantly denies she is depressed. She is c/o Pain in her neck going on for past few weeks Pain in her lower neck area. Cannot do extension or flexion of her head Pain not radiating down to her Arms. No Other Neurological symptoms Able to sleep well at night Appetite is good   Past Medical History:  Diagnosis Date  . Cervicalgia 08/12/2016  . CHF (congestive heart failure) ([facility])   . Chronic diastolic CHF (congestive heart failure), NYHA class 3 ([facility]) 06/17/2016  . Complication of anesthesia   . Edema 08/12/2016  . Hypertension   . New onset atrial fibrillation ([facility]) 04/2016   a. started on Xarelto and Cardizem  . PONV (postoperative nausea and vomiting)   . RA (rheumatoid arthritis) ([facility])   . SCC (squamous cell carcinoma) KA 09/15/2016   Left forearm - tx p bx  . SOB (shortness of breath)  04/2016   Hospitalist  . Vertigo     Past Surgical History:  Procedure Laterality Date  . Biopsy right breast Right    fibrocystic changes  . CARDIOVERSION N/A 07/08/2016   Procedure: CARDIOVERSION;  Surgeon: Larey Dresser, MD;  Location: [facility];  Service: Cardiovascular;  Laterality: N/A;  . CERVICAL SPINE SURGERY  2005   Dr. Vertell Limber  . HYSTEROSCOPY  2001  . JOINT REPLACEMENT    . REPLACEMENT TOTAL KNEE Right 1996   [facility] Bilateral [id number]   Baird Lyons MD    Allergies  Allergen Reactions  . Morphine Sulfate Other (See Comments)    "extremely nauseated"  . Other Nausea And Vomiting    Pt states she is very sensitive to pain medications.    Outpatient Encounter Medications as of 02/03/2021  Medication Sig  . acetaminophen (TYLENOL) 500 MG tablet Take 1,000 mg by mouth 2 (two) times daily.   Marland Kitchen acetaminophen (TYLENOL) 500 MG tablet Take 500 mg by mouth at bedtime.  Marland Kitchen amoxicillin (AMOXIL) 500 MG tablet Take 2,000 mg by mouth as needed. Take 1 hour prior to dental appointment.  . Calcium Carbonate (CALCIUM 600 PO) Take 1and 1/2  Tablet = 900 mg in the evening with dinner .  . calcium carbonate (OSCAL) 1500 (600 Ca) MG TABS tablet Take 1,500 mg by mouth daily. OTC  . dextromethorphan-guaiFENesin (MUCINEX DM) 30-600 MG 12hr tablet Take 1 tablet  by mouth 2 (two) times daily as needed for cough.  . diltiazem (CARDIZEM CD) 120 MG 24 hr capsule Take 1 capsule (120 mg total) by mouth daily.  . enalapril (VASOTEC) 20 MG tablet Take 20 mg by mouth 2 (two) times daily.  . furosemide (LASIX) 20 MG tablet Take by mouth daily. Take 60 mg  . Lidocaine (PAIN RELIEF MAXIMUM STRENGTH) 4 % PTCH Apply 1 patch topically every 12 (twelve) hours as needed. Apply patch to right side of neck on for 12 hours and off 12 hours at night.  . loratadine (CLARITIN) 10 MG tablet Take 10 mg by mouth daily.  . Magnesium Oxide 250 MG TABS Take 250 mg by mouth daily.   . Menthol 10 % AERO Apply topically in the morning, at noon, in the evening, and at bedtime. Apply cream to right side of neck.  . metoprolol succinate (TOPROL-XL) 50 MG 24 hr tablet Take 50 mg by mouth. Give with Metoprolol 176m Once A Day  . Metoprolol Succinate 100 MG CS24 Take 150 mg by mouth at bedtime.  . OXYGEN Inhale 2 L into the lungs daily as needed (shortness of breath). Use 2 liter as needed for SOB, 02 sat < 90% on room air  . potassium chloride SA (K-DUR,KLOR-CON) 20 MEQ tablet Take 30 mEq by mouth daily.   . predniSONE (DELTASONE) 1 MG tablet Take 4 mg by mouth daily with breakfast.  . Rivaroxaban (XARELTO) 15 MG TABS tablet Take 15 mg by mouth daily with supper.  . Soft Lens Products (REWETTING DROPS) SOLN 1 drop by Does not apply route every 6 (six) hours as needed.  . vitamin C (ASCORBIC ACID) 500 MG tablet Take 500 mg by mouth daily.  . Wheat Dextrin (BENEFIBER PO) Take 4 scoop by mouth daily.   . metolazone (ZAROXOLYN) 2.5 MG tablet Take 2.5 mg by mouth daily.   No facility-administered encounter medications on file as of 02/03/2021.    Review of Systems:  Review of Systems  Constitutional: Positive for activity change.  HENT: Negative.   Respiratory: Positive for shortness of breath.   Cardiovascular: Positive for leg swelling.  Gastrointestinal: Negative.   Genitourinary: Negative.  Negative for difficulty urinating.  Musculoskeletal: Positive for arthralgias, back pain, gait problem, neck pain and neck stiffness.  Skin: Negative.   Neurological: Negative for dizziness.  Psychiatric/Behavioral: Negative.  Negative for confusion and dysphoric mood.    Health Maintenance  Topic Date Due  . COVID-19 Vaccine (3 - Booster for Moderna series) 07/11/2020  . TETANUS/TDAP  04/12/2021  . INFLUENZA VACCINE  Completed  . DEXA SCAN  Completed  . PNA vac Low Risk Adult  Completed    Physical Exam: Vitals:   02/03/21 1638  BP: 140/82  Pulse: 66  Resp: 18  Temp:  98.2 F (36.8 C)  SpO2: 100%  Weight: 154 lb 6.4 oz (70 kg)  Height: 5' 7"  (1.702 m)   Body mass index is 24.18 kg/m. Physical Exam Vitals reviewed.  Constitutional:      Appearance: Normal appearance.  HENT:     Head: Normocephalic.     Nose: Nose normal.     Mouth/Throat:     Mouth: Mucous membranes are moist.     Pharynx: Oropharynx is clear.  Eyes:     Pupils: Pupils are equal, round, and reactive to light.  Cardiovascular:     Rate and Rhythm: Normal rate. Rhythm irregular.     Pulses: Normal pulses.  Heart sounds: Normal heart sounds.  Pulmonary:     Effort: Pulmonary effort is normal.     Breath sounds: Normal breath sounds.  Abdominal:     General: Abdomen is flat. Bowel sounds are normal.     Palpations: Abdomen is soft.  Musculoskeletal:        General: Swelling present.     Cervical back: Neck supple.     Comments: Neck exam No Swelling but has Point tenderness. Not able to move her neck more then 10-20 Degrees bot side.    Skin:    General: Skin is warm.  Neurological:     General: No focal deficit present.     Mental Status: She is alert and oriented to person, place, and time.  Psychiatric:        Mood and Affect: Mood normal.        Thought Content: Thought content normal.        Judgment: Judgment normal.     Labs reviewed: Basic Metabolic Panel: Recent Labs    05/21/20 0000 06/17/20 0000 09/10/20 0000 10/21/20 0000  NA 139  --  138 138  K 4.1  --  4.2 3.8  CL 104  --  101 97*  CO2 25*  --  28* 32*  BUN 24*  --  21 37*  CREATININE 0.9  --  0.9 1.2*  CALCIUM 9.5  --  9.5 10.2  MG  --  2.1  --   --    Liver Function Tests: Recent Labs    02/14/20 0000 05/21/20 0000  AST 22 21  ALT 14 15  ALKPHOS 45 54  ALBUMIN 3.9 4.2   No results for input(s): LIPASE, AMYLASE in the last 8760 hours. No results for input(s): AMMONIA in the last 8760 hours. CBC: Recent Labs    02/14/20 0000 05/21/20 0000  WBC 4.7 5.6  NEUTROABS 3,173  3,478  HGB 13.1 13.4  HCT 38 40  PLT 132* 140*   Lipid Panel: Recent Labs    02/14/20 0000  CHOL 148  HDL 57  LDLCALC 71  TRIG 121   Lab Results  Component Value Date   HGBA1C 5.4 05/17/2017    Procedures since last visit: No results found.  Assessment/Plan 1. Neck pain ? Inflammatory Arthritis Has H/o Rheumatoid arthritis Check ESR, CRP, CBC,CMP Tylenol not helping Trying to avoid NSAIDS as she is on Xarelto Will increase the Prednisone to 10 mg QD for 2 weeks and then back to 4 mg QD   2. Chronic diastolic CHF (congestive heart failure), NYHA class 3 (HCC) Doing well on Lasix   3. Permanent atrial fibrillation (HCC) Xarelto and Metoprolol and CArdizem  4. Rheumatoid arthritis involving multiple sites with positive rheumatoid factor (HCC) On Low dose of Prednisone on Chronic Basis Does not follows with Rheumatology 5 Depression At this time her symptoms are stable No Antidepressant for now    Labs/tests ordered:  * No order type specified * Next appt:  Visit date not found

## 2021-02-04 DIAGNOSIS — I5032 Chronic diastolic (congestive) heart failure: Secondary | ICD-10-CM | POA: Diagnosis not present

## 2021-02-04 DIAGNOSIS — R58 Hemorrhage, not elsewhere classified: Secondary | ICD-10-CM | POA: Diagnosis not present

## 2021-02-05 DIAGNOSIS — I5032 Chronic diastolic (congestive) heart failure: Secondary | ICD-10-CM | POA: Diagnosis not present

## 2021-02-05 LAB — CBC AND DIFFERENTIAL
HCT: 33 — AB (ref 36–46)
Hemoglobin: 10.9 — AB (ref 12.0–16.0)
Neutrophils Absolute: 3532
Platelets: 133 — AB (ref 150–399)
WBC: 6.1

## 2021-02-05 LAB — BASIC METABOLIC PANEL
BUN: 30 — AB (ref 4–21)
CO2: 27 — AB (ref 13–22)
Chloride: 105 (ref 99–108)
Creatinine: 0.9 (ref 0.5–1.1)
Glucose: 87
Potassium: 4.4 (ref 3.4–5.3)
Sodium: 139 (ref 137–147)

## 2021-02-05 LAB — HEPATIC FUNCTION PANEL
ALT: 10 (ref 7–35)
AST: 19 (ref 13–35)
Alkaline Phosphatase: 46 (ref 25–125)
Bilirubin, Total: 0.5

## 2021-02-05 LAB — COMPREHENSIVE METABOLIC PANEL
Albumin: 3.6 (ref 3.5–5.0)
Calcium: 9.2 (ref 8.7–10.7)
Globulin: 1.5

## 2021-02-05 LAB — CBC: RBC: 3.53 — AB (ref 3.87–5.11)

## 2021-02-05 LAB — POCT ERYTHROCYTE SEDIMENTATION RATE, NON-AUTOMATED: Sed Rate: 14

## 2021-02-09 DIAGNOSIS — C44311 Basal cell carcinoma of skin of nose: Secondary | ICD-10-CM | POA: Diagnosis not present

## 2021-02-09 DIAGNOSIS — L814 Other melanin hyperpigmentation: Secondary | ICD-10-CM | POA: Diagnosis not present

## 2021-02-12 DIAGNOSIS — I5032 Chronic diastolic (congestive) heart failure: Secondary | ICD-10-CM | POA: Diagnosis not present

## 2021-02-12 DIAGNOSIS — R58 Hemorrhage, not elsewhere classified: Secondary | ICD-10-CM | POA: Diagnosis not present

## 2021-02-12 LAB — CBC: RBC: 4.03 (ref 3.87–5.11)

## 2021-02-12 LAB — CBC AND DIFFERENTIAL
HCT: 38 (ref 36–46)
Hemoglobin: 12.5 (ref 12.0–16.0)
Platelets: 166 (ref 150–399)
WBC: 6.8

## 2021-02-17 DIAGNOSIS — C44311 Basal cell carcinoma of skin of nose: Secondary | ICD-10-CM | POA: Diagnosis not present

## 2021-03-06 ENCOUNTER — Encounter: Payer: Self-pay | Admitting: Internal Medicine

## 2021-03-06 ENCOUNTER — Non-Acute Institutional Stay: Payer: PPO | Admitting: Internal Medicine

## 2021-03-06 DIAGNOSIS — L03116 Cellulitis of left lower limb: Secondary | ICD-10-CM

## 2021-03-06 DIAGNOSIS — I4821 Permanent atrial fibrillation: Secondary | ICD-10-CM | POA: Diagnosis not present

## 2021-03-06 DIAGNOSIS — I5032 Chronic diastolic (congestive) heart failure: Secondary | ICD-10-CM

## 2021-03-06 DIAGNOSIS — I10 Essential (primary) hypertension: Secondary | ICD-10-CM | POA: Diagnosis not present

## 2021-03-06 DIAGNOSIS — I872 Venous insufficiency (chronic) (peripheral): Secondary | ICD-10-CM | POA: Diagnosis not present

## 2021-03-06 DIAGNOSIS — M0579 Rheumatoid arthritis with rheumatoid factor of multiple sites without organ or systems involvement: Secondary | ICD-10-CM | POA: Diagnosis not present

## 2021-03-06 DIAGNOSIS — M542 Cervicalgia: Secondary | ICD-10-CM

## 2021-03-06 LAB — C-REACTIVE PROTEIN: CRP: 5

## 2021-03-06 NOTE — Progress Notes (Signed)
Location: [facility] Room Number: 097 Place of Service:  ALF [phone])  Provider: Veleta Miners MD  Code Status: DNR Managed Care Goals of Care:  Advanced Directives 10/08/2020  Does Patient Have a Medical Advance Directive? Yes  Type of Paramedic of [place];Living will  Does patient want to make changes to medical advance directive? No - Patient declined  Copy of [place] in Chart? Yes - validated most recent copy scanned in chart (See row information)  Would patient like information on creating a medical advance directive? -  Pre-existing out of facility DNR order (yellow form or pink MOST form) -     Chief Complaint  Patient presents with  . Acute Visit    Cellulitis    HPI: Patient is a 87 y.o. female seen today for an acute visit for Redness and tender Left LE  Patient has h/o Chronic Atrial Fibrillation on Xarelto, Hypertension, Diastolic CHF, Rheumatoid arthritis on Chronic Prednisone  Lives in [place] Was noticed to have redness in her left lower extremity.  Patient has a healed skin tear in the mid shin area and in redness around that.  She denies any fever or chills does have tenderness in that area. She does have edema but it has not worsened.  No discharge  Past Medical History:  Diagnosis Date  . Cervicalgia 08/12/2016  . CHF (congestive heart failure) ([place])   . Chronic diastolic CHF (congestive heart failure), NYHA class 3 ([place]) 06/17/2016  . Complication of anesthesia   . Edema 08/12/2016  . Hypertension   . New onset atrial fibrillation ([place]) 04/2016   a. started on Xarelto and Cardizem  . PONV (postoperative nausea and vomiting)   . RA (rheumatoid arthritis) ([place])   . SCC (squamous cell carcinoma) KA 09/15/2016   Left forearm - tx p bx  . SOB (shortness of breath) 04/2016   Hospitalist  . Vertigo     Past Surgical History:  Procedure Laterality Date  . Biopsy right breast Right     fibrocystic changes  . CARDIOVERSION N/A 07/08/2016   Procedure: CARDIOVERSION;  Surgeon: Larey Dresser, MD;  Location: [facility];  Service: Cardiovascular;  Laterality: N/A;  . CERVICAL SPINE SURGERY  2005   Dr. Vertell Limber  . HYSTEROSCOPY  2001  . JOINT REPLACEMENT    . REPLACEMENT TOTAL KNEE Right 1996   [place] Bilateral [phone]   Baird Lyons MD    Allergies  Allergen Reactions  . Morphine Sulfate Other (See Comments)    "extremely nauseated"  . Other Nausea And Vomiting    Pt states she is very sensitive to pain medications.    Outpatient Encounter Medications as of 03/06/2021  Medication Sig  . acetaminophen (TYLENOL) 500 MG tablet Take 1,000 mg by mouth 2 (two) times daily.   Marland Kitchen acetaminophen (TYLENOL) 500 MG tablet Take 500 mg by mouth at bedtime.  Marland Kitchen amoxicillin (AMOXIL) 500 MG tablet Take 2,000 mg by mouth as needed. Take 1 hour prior to dental appointment.  . Calcium Carbonate (CALCIUM 600 PO) Take 1and 1/2  Tablet = 900 mg in the evening with dinner .  . calcium carbonate (OSCAL) 1500 (600 Ca) MG TABS tablet Take 1,500 mg by mouth daily. OTC  . dextromethorphan-guaiFENesin (MUCINEX DM) 30-600 MG 12hr tablet Take 1 tablet by mouth 2 (two) times daily as needed for cough.  . diltiazem (CARDIZEM CD) 120 MG 24 hr capsule Take 1  capsule (120 mg total) by mouth daily.  Marland Kitchen doxycycline (VIBRA-TABS) 100 MG tablet Take 100 mg by mouth 2 (two) times daily. For 7 days  . enalapril (VASOTEC) 20 MG tablet Take 20 mg by mouth 2 (two) times daily.  . furosemide (LASIX) 20 MG tablet Take by mouth daily. Take 60 mg  . Lidocaine (PAIN RELIEF MAXIMUM STRENGTH) 4 % PTCH Apply 1 patch topically every 12 (twelve) hours as needed. Apply patch to right side of neck on for 12 hours and off 12 hours at night.  . loratadine (CLARITIN) 10 MG tablet Take 10 mg by mouth daily.  . Magnesium Oxide 250 MG TABS Take 250 mg by mouth daily.  . Menthol 10 % AERO Apply  topically in the morning, at noon, in the evening, and at bedtime. Apply cream to right side of neck.  . metoprolol succinate (TOPROL-XL) 50 MG 24 hr tablet Take 50 mg by mouth. Give with Metoprolol 100mg  Once A Day  . Metoprolol Succinate 100 MG CS24 Take 150 mg by mouth at bedtime.  . OXYGEN Inhale 2 L into the lungs daily as needed (shortness of breath). Use 2 liter as needed for SOB, 02 sat < 90% on room air  . potassium chloride SA (K-DUR,KLOR-CON) 20 MEQ tablet Take 30 mEq by mouth daily.   . predniSONE (DELTASONE) 1 MG tablet Take 4 mg by mouth daily with breakfast.  . Rivaroxaban (XARELTO) 15 MG TABS tablet Take 15 mg by mouth daily with supper.  . saccharomyces boulardii (FLORASTOR) 250 MG capsule Take 250 mg by mouth 2 (two) times daily. For 7 days  . Soft Lens Products (REWETTING DROPS) SOLN 1 drop by Does not apply route every 6 (six) hours as needed.  . vitamin C (ASCORBIC ACID) 500 MG tablet Take 500 mg by mouth daily.  . Wheat Dextrin (BENEFIBER PO) Take 4 scoop by mouth daily.    No facility-administered encounter medications on file as of 03/06/2021.    Review of Systems:  Review of Systems  Constitutional: Negative.   HENT: Negative.   Respiratory: Negative for shortness of breath.   Cardiovascular: Positive for leg swelling.  Gastrointestinal: Negative.   Genitourinary: Negative.   Musculoskeletal: Positive for gait problem.  Skin: Positive for color change.  Neurological: Positive for weakness.  Psychiatric/Behavioral: Negative.     Health Maintenance  Topic Date Due  . COVID-19 Vaccine (3 - Booster for Moderna series) 07/11/2020  . TETANUS/TDAP  04/12/2021  . INFLUENZA VACCINE  Completed  . DEXA SCAN  Completed  . PNA vac Low Risk Adult  Completed  . HPV VACCINES  Aged Out    Physical Exam: Vitals:   03/06/21 1516  BP: 138/70  Pulse: 66  Resp: 18  Temp: (!) 97.5 F (36.4 C)  SpO2: 94%  Weight: 155 lb (70.3 kg)  Height: 5\' 7"  (1.702 m)   Body  mass index is 24.28 kg/m. Physical Exam  Constitutional: Oriented to person, place, and time. Well-developed and well-nourished.  HENT:  Head: Normocephalic.  Mouth/Throat: Oropharynx is clear and moist.  Eyes: Pupils are equal, round, and reactive to light.  Neck: Neck supple.  Cardiovascular: Normal rate and normal heart sounds.  No murmur heard. Pulmonary/Chest: Effort normal and breath sounds normal. No respiratory distress. No wheezes. She has no rales.  Abdominal: Soft. Bowel sounds are normal. No distension. There is no tenderness. There is no rebound.  Musculoskeletal: Has Edema Bilateral  Left LE is red and tender with healed  Skin tear in mid shin area.  Lymphadenopathy: none Neurological: Alert and oriented to person, place, and time.  Skin: Skin is warm and dry.  Psychiatric: Normal mood and affect. Behavior is normal. Thought content normal.   Labs reviewed: Basic Metabolic Panel: Recent Labs    06/17/20 0000 09/10/20 0000 10/21/20 0000 02/05/21 0000  NA  --  138 138 139  K  --  4.2 3.8 4.4  CL  --  101 97* 105  CO2  --  28* 32* 27*  BUN  --  21 37* 30*  CREATININE  --  0.9 1.2* 0.9  CALCIUM  --  9.5 10.2 9.2  MG 2.1  --   --   --    Liver Function Tests: Recent Labs    05/21/20 0000 02/05/21 0000  AST 21 19  ALT 15 10  ALKPHOS 54 46  ALBUMIN 4.2 3.6   No results for input(s): LIPASE, AMYLASE in the last 8760 hours. No results for input(s): AMMONIA in the last 8760 hours. CBC: Recent Labs    05/21/20 0000 02/05/21 0000 02/12/21 0000  WBC 5.6 6.1 6.8  NEUTROABS 3,478 3,532.00  --   HGB 13.4 10.9* 12.5  HCT 40 33* 38  PLT 140* 133* 166   Lipid Panel: No results for input(s): CHOL, HDL, LDLCALC, TRIG, CHOLHDL, LDLDIRECT in the last 8760 hours. Lab Results  Component Value Date   HGBA1C 5.4 05/17/2017    Procedures since last visit: No results found.  Assessment/Plan Cellulitis of left lower extremity Start on Doxycyline 100 mg BID for  7 days   Chronic diastolic CHF (congestive heart failure), NYHA class 3 (HCC) Edema seems controlled with Lasix Weight is stable Will consider Increasing diuretics redness does nto get better  Neck pain Controlled with Tylenol  Failed trial of Prednisone  Permanent atrial fibrillation (HCC) On Cardizem and Xarelto and Metoprolol Rheumatoid arthritis involving multiple sites with positive rheumatoid factor (HCC) Low dose of Prednisone Essential hypertension On Cardizem and Vasotec   Labs/tests ordered:  * No order type specified * Next appt:  Visit date not found

## 2021-03-16 ENCOUNTER — Encounter: Payer: Self-pay | Admitting: Nurse Practitioner

## 2021-03-16 ENCOUNTER — Non-Acute Institutional Stay: Payer: PPO | Admitting: Nurse Practitioner

## 2021-03-16 DIAGNOSIS — L97929 Non-pressure chronic ulcer of unspecified part of left lower leg with unspecified severity: Secondary | ICD-10-CM

## 2021-03-16 DIAGNOSIS — M542 Cervicalgia: Secondary | ICD-10-CM | POA: Diagnosis not present

## 2021-03-16 DIAGNOSIS — I10 Essential (primary) hypertension: Secondary | ICD-10-CM

## 2021-03-16 DIAGNOSIS — I83892 Varicose veins of left lower extremities with other complications: Secondary | ICD-10-CM

## 2021-03-16 DIAGNOSIS — I5032 Chronic diastolic (congestive) heart failure: Secondary | ICD-10-CM | POA: Diagnosis not present

## 2021-03-16 DIAGNOSIS — M0579 Rheumatoid arthritis with rheumatoid factor of multiple sites without organ or systems involvement: Secondary | ICD-10-CM

## 2021-03-16 DIAGNOSIS — R609 Edema, unspecified: Secondary | ICD-10-CM | POA: Diagnosis not present

## 2021-03-16 DIAGNOSIS — I83029 Varicose veins of left lower extremity with ulcer of unspecified site: Secondary | ICD-10-CM

## 2021-03-16 DIAGNOSIS — I4821 Permanent atrial fibrillation: Secondary | ICD-10-CM

## 2021-03-16 DIAGNOSIS — R600 Localized edema: Secondary | ICD-10-CM

## 2021-03-16 NOTE — Assessment & Plan Note (Signed)
the left lower leg open wound, mild erythema and warmth peri wound, s/p Doxycycline for cellulitis of left lower extremity, no odorous drainage or significant pain or swelling. Will add Metolazone 2.5mg  MWF, repeat BMP one week. Will apply Bactroban oint daily until healed.

## 2021-03-16 NOTE — Assessment & Plan Note (Signed)
Chronic neck pain/cervicalgia, 08/14/16 X-ray C spine: anterior interbody fusion cervical spine, severe degenerative disc disease C7-T1, has shoulder prosthesis.  2019 right side neck pain, inj 02/05/19 Ortho 12/25/20 the patient declined Gabapentin 100mg  qd.  Failed trial of Prednisone.

## 2021-03-16 NOTE — Assessment & Plan Note (Signed)
heart rate is in control, on Diltiazem 120mg  qd, Metoprolol 150mg  qd, Xarelto 15mg  qd. Cardiology f/u

## 2021-03-16 NOTE — Assessment & Plan Note (Signed)
CHF/edema BLE, on Furosemide60mg  qd.EF 65% 2017. Bun/creat 30/0.9 02/05/21

## 2021-03-16 NOTE — Progress Notes (Signed)
Location:   [facility] Room Number: 161 Place of Service:  ALF (13) Provider: Lennie Odor Darbi Chandran NP  Virgie Dad, MD  Patient Care Team: Virgie Dad, MD as PCP - General (Internal Medicine) [place], Peter M, MD as PCP - Cardiology (Cardiology) Cannen Dupras X, NP as Nurse Practitioner (Internal Medicine) [place], Peter M, MD as Consulting Physician (Cardiology) Lavonna Monarch, MD as Consulting Physician (Dermatology) Garald Balding, MD as Consulting Physician (Orthopedic Surgery)  Extended Emergency Contact Information Primary Emergency Contact: Berneta Levins Address: [address] [place], [place] [address] Johnnette Litter of [place] Phone: [phone] Work Phone: [phone] Mobile Phone: [phone] Relation: Daughter Secondary Emergency Contact: Azalee Course [place] of [place] Phone: [phone] Mobile Phone: [phone] Relation: Son  Code Status: DNR Goals of care: Advanced Directive information Advanced Directives 10/08/2020  Does Patient Have a Medical Advance Directive? Yes  Type of Paramedic of [place];Living will  Does patient want to make changes to medical advance directive? No - Patient declined  Copy of [place] in Chart? Yes - validated most recent copy scanned in chart (See row information)  Would patient like information on creating a medical advance directive? -  Pre-existing out of facility DNR order (yellow form or pink MOST form) -     Chief Complaint  Patient presents with  . Acute Visit    Left lower leg open area.     HPI:  Pt is a 87 y.o. female seen today for an acute visit for the left lower leg open wound, mild erythema and warmth peri wound, s/p Doxycycline for cellulitis of left lower extremity, no odorous drainage or significant pain or swelling.     Chronic neck pain/cervicalgia, 08/14/16 X-ray C spine: anterior interbody fusion cervical spine, severe  degenerative disc disease C7-T1, has shoulder prosthesis.  2019 right side neck pain, inj 02/05/19 Ortho 12/25/20 the patient declined Gabapentin 100mg  qd.  Failed trial of Prednisone.              Chronic venous insufficiency BLE             CHF/edema BLE, on Furosemide60mg  qd.EF 65% 2017. Bun/creat 30/0.9 02/05/21 Afib, heart rate is in control, on Diltiazem 120mg  qd, Metoprolol 150mg  qd, Xarelto 15mg  qd. Cardiology f/u  HTN, blood pressure is controlled, on Enalapril 20mg  bid along with Metoprolol and Diltiazem.  RA, positive FAF, pain, multiples sites, on Prednisone 4mg  qd, Tylenol 1000mg  bid, 500mg  qd.f/u Rheumatology  Past Medical History:  Diagnosis Date  . Cervicalgia 08/12/2016  . CHF (congestive heart failure) ([place])   . Chronic diastolic CHF (congestive heart failure), NYHA class 3 ([place]) 06/17/2016  . Complication of anesthesia   . Edema 08/12/2016  . Hypertension   . New onset atrial fibrillation ([place]) 04/2016   a. started on Xarelto and Cardizem  . PONV (postoperative nausea and vomiting)   . RA (rheumatoid arthritis) ([place])   . SCC (squamous cell carcinoma) KA 09/15/2016   Left forearm - tx p bx  . SOB (shortness of breath) 04/2016   Hospitalist  . Vertigo    Past Surgical History:  Procedure Laterality Date  . Biopsy right breast Right    fibrocystic changes  . CARDIOVERSION N/A 07/08/2016   Procedure: CARDIOVERSION;  Surgeon: Larey Dresser, MD;  Location: [facility];  Service: Cardiovascular;  Laterality: N/A;  . CERVICAL SPINE SURGERY  2005   Dr.  Vertell Limber  . HYSTEROSCOPY  2001  . JOINT REPLACEMENT    . REPLACEMENT TOTAL KNEE Right 1996   [place] Bilateral [phone]   Baird Lyons MD    Allergies  Allergen Reactions  . Morphine Sulfate Other (See Comments)    "extremely nauseated"  . Other Nausea And Vomiting    Pt states she is very sensitive to pain medications.    Allergies as  of 03/16/2021      Reactions   Morphine Sulfate Other (See Comments)   "extremely nauseated"   Other Nausea And Vomiting   Pt states she is very sensitive to pain medications.      Medication List       Accurate as of March 16, 2021 11:59 PM. If you have any questions, ask your nurse or doctor.        acetaminophen 500 MG tablet Commonly known as: TYLENOL Take 1,000 mg by mouth 2 (two) times daily.   acetaminophen 500 MG tablet Commonly known as: TYLENOL Take 500 mg by mouth at bedtime.   amoxicillin 500 MG tablet Commonly known as: AMOXIL Take 2,000 mg by mouth as needed. Take 1 hour prior to dental appointment.   BENEFIBER PO Take 4 scoop by mouth daily.   CALCIUM 600 PO Take 1and 1/2  Tablet = 900 mg in the evening with dinner .   calcium carbonate 1500 (600 Ca) MG Tabs tablet Commonly known as: OSCAL Take 1,500 mg by mouth daily. OTC   dextromethorphan-guaiFENesin 30-600 MG 12hr tablet Commonly known as: MUCINEX DM Take 1 tablet by mouth 2 (two) times daily as needed for cough.   diltiazem 120 MG 24 hr capsule Commonly known as: CARDIZEM CD Take 1 capsule (120 mg total) by mouth daily.   enalapril 20 MG tablet Commonly known as: VASOTEC Take 20 mg by mouth 2 (two) times daily.   furosemide 20 MG tablet Commonly known as: LASIX Take by mouth daily. Take 60 mg   loratadine 10 MG tablet Commonly known as: CLARITIN Take 10 mg by mouth daily.   Magnesium Oxide 250 MG Tabs Take 250 mg by mouth daily.   Menthol 10 % Aero Apply topically in the morning, at noon, in the evening, and at bedtime. Apply cream to right side of neck.   metoprolol succinate 50 MG 24 hr tablet Commonly known as: TOPROL-XL Take 50 mg by mouth. Give with Metoprolol 100mg  Once A Day   Metoprolol Succinate 100 MG Cs24 Take 150 mg by mouth at bedtime.   OXYGEN Inhale 2 L into the lungs daily as needed (shortness of breath). Use 2 liter as needed for SOB, 02 sat < 90% on room air    Pain Relief Maximum Strength 4 % Ptch Generic drug: Lidocaine Apply 1 patch topically every 12 (twelve) hours as needed. Apply patch to right side of neck on for 12 hours and off 12 hours at night.   potassium chloride SA 20 MEQ tablet Commonly known as: KLOR-CON Take 30 mEq by mouth daily.   predniSONE 1 MG tablet Commonly known as: DELTASONE Take 4 mg by mouth daily with breakfast.   Rewetting Drops Soln 1 drop by Does not apply route every 6 (six) hours as needed.   Rivaroxaban 15 MG Tabs tablet Commonly known as: XARELTO Take 15 mg by mouth daily with supper.   vitamin C 500 MG tablet Commonly known as: ASCORBIC ACID Take 500 mg by mouth daily.  Review of Systems  Constitutional: Negative for fatigue, fever and unexpected weight change.  HENT: Positive for hearing loss. Negative for congestion and voice change.   Eyes: Negative for visual disturbance.  Respiratory: Positive for shortness of breath. Negative for cough and wheezing.        DOE  Cardiovascular: Positive for leg swelling. Negative for chest pain.  Gastrointestinal: Negative for abdominal pain and constipation.  Genitourinary: Negative for difficulty urinating, dysuria and urgency.  Musculoskeletal: Positive for arthralgias, gait problem and myalgias.  Skin: Positive for color change and wound.       Lateral left lower leg  Neurological: Negative for speech difficulty, weakness and headaches.       Memory lapses.   Psychiatric/Behavioral: Negative for behavioral problems and sleep disturbance. The patient is not nervous/anxious.     Immunization History  Administered Date(s) Administered  . Influenza Whole 09/14/2018  . Influenza, High Dose Seasonal PF 09/15/2019  . Influenza-Unspecified 09/13/2015, 09/29/2016, 10/03/2017, 09/23/2020  . Moderna Sars-Covid-2 Vaccination 12/15/2019, 01/12/2020  . Pneumococcal Conjugate-13 10/03/2017  . Pneumococcal-Unspecified 05/14/2015  . Tdap 04/13/2011    Pertinent  Health Maintenance Due  Topic Date Due  . INFLUENZA VACCINE  07/13/2021  . DEXA SCAN  Completed  . PNA vac Low Risk Adult  Completed   Fall Risk  09/01/2018 08/23/2017 08/12/2016  Falls in the past year? No No No   Functional Status Survey:    Vitals:   03/16/21 1346  BP: 116/70  Pulse: 76  Resp: 18  Temp: 97.6 F (36.4 C)  SpO2: 96%  Weight: 152 lb (68.9 kg)   Body mass index is 23.81 kg/m. Physical Exam Vitals and nursing note reviewed.  Constitutional:      Appearance: Normal appearance.  HENT:     Head: Normocephalic and atraumatic.     Mouth/Throat:     Mouth: Mucous membranes are moist.  Eyes:     Extraocular Movements: Extraocular movements intact.     Conjunctiva/sclera: Conjunctivae normal.     Pupils: Pupils are equal, round, and reactive to light.  Cardiovascular:     Rate and Rhythm: Normal rate. Rhythm irregular.     Heart sounds: No murmur heard.   Pulmonary:     Breath sounds: Rales present.     Comments: Posterior right base.  Abdominal:     General: Bowel sounds are normal.     Palpations: Abdomen is soft.     Tenderness: There is no abdominal tenderness.  Musculoskeletal:     Cervical back: Normal range of motion.     Right lower leg: Edema present.     Left lower leg: Edema present.     Comments: 1+ LLE edema, trace edema RLE. Limited ROM R+L shoulders.   Skin:    General: Skin is warm and dry.     Comments: Chronic venous insufficiency skin changes BLE. Lateral left lower leg skin tear area developed into a stasis ulcer, mild redness, warmth, no odorous drainage, no pain in calf or LLE during my examination.   Neurological:     General: No focal deficit present.     Mental Status: She is alert. Mental status is at baseline.     Gait: Gait abnormal.     Comments: Oriented to person, place.   Psychiatric:        Mood and Affect: Mood normal.        Behavior: Behavior normal.        Thought Content: Thought content normal.  Judgment: Judgment normal.     Labs reviewed: Recent Labs    06/17/20 0000 09/10/20 0000 10/21/20 0000 02/05/21 0000  NA  --  138 138 139  K  --  4.2 3.8 4.4  CL  --  101 97* 105  CO2  --  28* 32* 27*  BUN  --  21 37* 30*  CREATININE  --  0.9 1.2* 0.9  CALCIUM  --  9.5 10.2 9.2  MG 2.1  --   --   --    Recent Labs    05/21/20 0000 02/05/21 0000  AST 21 19  ALT 15 10  ALKPHOS 54 46  ALBUMIN 4.2 3.6   Recent Labs    05/21/20 0000 02/05/21 0000 02/12/21 0000  WBC 5.6 6.1 6.8  NEUTROABS 3,478 3,532.00  --   HGB 13.4 10.9* 12.5  HCT 40 33* 38  PLT 140* 133* 166   Lab Results  Component Value Date   TSH 1.82 03/07/2018   Lab Results  Component Value Date   HGBA1C 5.4 05/17/2017   Lab Results  Component Value Date   CHOL 148 02/14/2020   HDL 57 02/14/2020   LDLCALC 71 02/14/2020   TRIG 121 02/14/2020   CHOLHDL 2.4 05/04/2016    Significant Diagnostic Results in last 30 days:  No results found.  Assessment/Plan: Venous stasis ulcer of left lower leg with edema of left lower leg (HCC) the left lower leg open wound, mild erythema and warmth peri wound, s/p Doxycycline for cellulitis of left lower extremity, no odorous drainage or significant pain or swelling. Will add Metolazone 2.5mg  MWF, repeat BMP one week. Will apply Bactroban oint daily until healed.   Cervicalgia Chronic neck pain/cervicalgia, 08/14/16 X-ray C spine: anterior interbody fusion cervical spine, severe degenerative disc disease C7-T1, has shoulder prosthesis.  2019 right side neck pain, inj 02/05/19 Ortho 12/25/20 the patient declined Gabapentin 100mg  qd.  Failed trial of Prednisone.  Chronic diastolic CHF (congestive heart failure), NYHA class 3 (HCC) CHF/edema BLE, on Furosemide60mg  qd.EF 65% 2017. Bun/creat 30/0.9 02/05/21   Atrial fibrillation (HCC) heart rate is in control, on Diltiazem 120mg  qd, Metoprolol 150mg  qd, Xarelto 15mg  qd. Cardiology f/u    Essential  hypertension blood pressure is controlled, on Enalapril 20mg  bid along with Metoprolol and Diltiazem.    Rheumatoid arthritis ([place]) positive FAF, pain, multiples sites, on Prednisone 4mg  qd, Tylenol 1000mg  bid, 500mg  qd.f/u Rheumatology     Family/ staff Communication: plan of care reviewed with the patient and charge nurse.   Labs/tests ordered:  BMP  Time spend 40 minutes.

## 2021-03-16 NOTE — Assessment & Plan Note (Signed)
blood pressure is controlled, on Enalapril 20mg  bid along with Metoprolol and Diltiazem.

## 2021-03-16 NOTE — Assessment & Plan Note (Signed)
positive FAF, pain, multiples sites, on Prednisone 4mg  qd, Tylenol 1000mg  bid, 500mg  qd.f/u Rheumatology

## 2021-03-17 ENCOUNTER — Encounter: Payer: Self-pay | Admitting: Nurse Practitioner

## 2021-03-24 DIAGNOSIS — R600 Localized edema: Secondary | ICD-10-CM | POA: Diagnosis not present

## 2021-03-26 DIAGNOSIS — L570 Actinic keratosis: Secondary | ICD-10-CM | POA: Diagnosis not present

## 2021-03-26 DIAGNOSIS — Z85828 Personal history of other malignant neoplasm of skin: Secondary | ICD-10-CM | POA: Diagnosis not present

## 2021-03-26 DIAGNOSIS — D229 Melanocytic nevi, unspecified: Secondary | ICD-10-CM | POA: Diagnosis not present

## 2021-04-06 ENCOUNTER — Encounter: Payer: Self-pay | Admitting: Nurse Practitioner

## 2021-04-06 ENCOUNTER — Non-Acute Institutional Stay: Payer: PPO | Admitting: Nurse Practitioner

## 2021-04-06 DIAGNOSIS — I10 Essential (primary) hypertension: Secondary | ICD-10-CM

## 2021-04-06 DIAGNOSIS — R531 Weakness: Secondary | ICD-10-CM | POA: Diagnosis not present

## 2021-04-06 DIAGNOSIS — I4821 Permanent atrial fibrillation: Secondary | ICD-10-CM

## 2021-04-06 DIAGNOSIS — M0579 Rheumatoid arthritis with rheumatoid factor of multiple sites without organ or systems involvement: Secondary | ICD-10-CM

## 2021-04-06 DIAGNOSIS — M542 Cervicalgia: Secondary | ICD-10-CM

## 2021-04-06 DIAGNOSIS — I5032 Chronic diastolic (congestive) heart failure: Secondary | ICD-10-CM

## 2021-04-06 NOTE — Assessment & Plan Note (Signed)
generalized weakness, but the patient stated she feels well, denied cough, dysuria, O2 desaturation, she is afebrile. Will update CBC/diff, CMP/eGFR

## 2021-04-06 NOTE — Assessment & Plan Note (Signed)
RA, positive FAF, pain, multiples sites, on Prednisone 4mg  qd, Tylenol 1000mg  bid, 500mg  qd.f/u Rheumatology

## 2021-04-06 NOTE — Assessment & Plan Note (Signed)
Chronic neck pain/cervicalgia, 08/14/16 X-ray C spine: anterior interbody fusion cervical spine, severe degenerative disc disease C7-T1, has shoulder prosthesis.  2019 right side neck pain, inj 02/05/19 Ortho 12/25/20 the patient declined Gabapentin 100mg qd.  Failed trial of Prednisone. 

## 2021-04-06 NOTE — Assessment & Plan Note (Signed)
Afib, heart rate is in control, on Diltiazem 120mg  qd, Metoprolol 150mg  qd, Xarelto 15mg  qd.Cardiology f/u

## 2021-04-06 NOTE — Assessment & Plan Note (Signed)
Chronic venous insufficiency BLE CHF/edema BLE, on Furosemide28m qd.EF 65% 2017. Creat 1.13 eGFR 43 03/25/21.   Added Metolazone 2.539mMWF 03/16/21  04/06/21 improved swelling in legs, open wounds area are scabbed over, chronic venous insufficiency skin changes at her baseline, will decrease Metolazone to 2.81m37mx/wk

## 2021-04-06 NOTE — Progress Notes (Signed)
Location:   [facility] [place] Room Number: 553 Place of Service:  ALF (13) Provider: Lennie Odor Juliya Magill NP  Virgie Dad, MD  Patient Care Team: Virgie Dad, MD as PCP - General (Internal Medicine) [place], Peter M, MD as PCP - Cardiology (Cardiology) Pascal Stiggers X, NP as Nurse Practitioner (Internal Medicine) [place], Peter M, MD as Consulting Physician (Cardiology) Lavonna Monarch, MD as Consulting Physician (Dermatology) Garald Balding, MD as Consulting Physician (Orthopedic Surgery)  Extended Emergency Contact Information Primary Emergency Contact: Berneta Levins Address: [address] [place], [place] [address] Johnnette Litter of [place] Phone: [phone] Work Phone: [phone] Mobile Phone: [phone] Relation: Daughter Secondary Emergency Contact: Azalee Course [place] of [place] Phone: [phone] Mobile Phone: [phone] Relation: Son  Code Status: DNR Goals of care: Advanced Directive information Advanced Directives 04/06/2021  Does Patient Have a Medical Advance Directive? Yes  Type of Paramedic of [place];Living will;Out of facility DNR (pink MOST or yellow form)  Does patient want to make changes to medical advance directive? No - Patient declined  Copy of [place] in Chart? Yes - validated most recent copy scanned in chart (See row information)  Would patient like information on creating a medical advance directive? -  Pre-existing out of facility DNR order (yellow form or pink MOST form) Yellow form placed in chart (order not valid for inpatient use)     Chief Complaint  Patient presents with  . Acute Visit    Generalized weakness. Discuss nedd for COVID booster    HPI:  Pt is a 87 y.o. female seen today for an acute visit for generalized weakness, but the patient stated she feels well, denied cough, dysuria, O2 desaturation, she is afebrile.   Chronic neck  pain/cervicalgia,08/14/16 X-ray C spine: anterior interbody fusion cervical spine, severe degenerative disc disease C7-T1, has shoulder prosthesis.  2019 right side neck pain, inj 02/05/19 Ortho  12/25/20 the patient declined Gabapentin 120m qd.  Failed trial of Prednisone.  Chronic venous insufficiency BLE CHF/edema BLE, on Furosemide66mqd.EF 65% 2017. Creat 1.13 eGFR 43 03/25/21.   Added Metolazone 2.11m31mWF 03/16/21 Afib, heart rate is in control, on Diltiazem 120m76m, Metoprolol 150mg62m Xarelto 111mg 6mardiology f/u  HTN, blood pressure is controlled, on Enalapril 20mg b60mlong with Metoprolol and Diltiazem.  RA, positive FAF, pain, multiples sites, on Prednisone 4mg qd,36mlenol 1000mg bid58m0mg qd.f59mheumatology    Past Medical History:  Diagnosis Date  . Cervicalgia 08/12/2016  . CHF (congestive heart failure) (HCC)   . [place] diastolic CHF (congestive heart failure), NYHA class 3 (HCC) 7/6/2[place]  . Complication of anesthesia   . Edema 08/12/2016  . Hypertension   . New onset atrial fibrillation (HCC) 05/20[place]  a. started on Xarelto and Cardizem  . PONV (postoperative nausea and vomiting)   . RA (rheumatoid arthritis) (HCC)   . STroy(squamous cell carcinoma) KA 09/15/2016   Left forearm - tx p bx  . SOB (shortness of breath) 04/2016   Hospitalist  . Vertigo    Past Surgical History:  Procedure Laterality Date  . Biopsy right breast Right    fibrocystic changes  . CARDIOVERSION N/A 07/08/2016   Procedure: CARDIOVERSION;  Surgeon: Dalton S ML[place]: [facility] [place]: Cardiovascular;  Laterality: N/A;  . CERVICAL SPINE SURGERY  2005   Dr. Stern  . HVertell LimberOSCOPY  2001  .  JOINT REPLACEMENT    . REPLACEMENT TOTAL KNEE Right 1996   [place] Bilateral [id number]   Baird Lyons MD    Allergies  Allergen Reactions  . Morphine Sulfate Other (See  Comments)    "extremely nauseated"  . Other Nausea And Vomiting    Pt states she is very sensitive to pain medications.    Allergies as of 04/06/2021      Reactions   Morphine Sulfate Other (See Comments)   "extremely nauseated"   Other Nausea And Vomiting   Pt states she is very sensitive to pain medications.      Medication List       Accurate as of April 06, 2021 11:59 PM. If you have any questions, ask your nurse or doctor.        acetaminophen 500 MG tablet Commonly known as: TYLENOL Take 1,000 mg by mouth 2 (two) times daily.   acetaminophen 500 MG tablet Commonly known as: TYLENOL Take 500 mg by mouth at bedtime.   amoxicillin 500 MG tablet Commonly known as: AMOXIL Take 2,000 mg by mouth as needed. Take 1 hour prior to dental appointment.   BENEFIBER PO Take 4 scoop by mouth daily.   CALCIUM 600 PO Take 1and 1/2  Tablet = 900 mg in the evening with dinner .   calcium carbonate 1500 (600 Ca) MG Tabs tablet Commonly known as: OSCAL Take 1,500 mg by mouth daily. OTC   dextromethorphan-guaiFENesin 30-600 MG 12hr tablet Commonly known as: MUCINEX DM Take 1 tablet by mouth 2 (two) times daily as needed for cough.   diltiazem 120 MG 24 hr capsule Commonly known as: CARDIZEM CD Take 1 capsule (120 mg total) by mouth daily.   enalapril 20 MG tablet Commonly known as: VASOTEC Take 20 mg by mouth 2 (two) times daily.   furosemide 20 MG tablet Commonly known as: LASIX Take by mouth daily. Take 60 mg   loratadine 10 MG tablet Commonly known as: CLARITIN Take 10 mg by mouth daily.   Magnesium Oxide 250 MG Tabs Take 250 mg by mouth daily.   Menthol 10 % Aero Apply topically in the morning, at noon, in the evening, and at bedtime. Apply cream to right side of neck.   metoprolol succinate 50 MG 24 hr tablet Commonly known as: TOPROL-XL Take 50 mg by mouth. Give with Metoprolol 13m Once A Day   Metoprolol Succinate 100 MG Cs24 Take 150 mg by mouth at  bedtime.   mupirocin ointment 2 % Commonly known as: BACTROBAN 1 application daily.   OXYGEN Inhale 2 L into the lungs daily as needed (shortness of breath). Use 2 liter as needed for SOB, 02 sat < 90% on room air   Pain Relief Maximum Strength 4 % Ptch Generic drug: Lidocaine Apply 1 patch topically every 12 (twelve) hours as needed. Apply patch to right side of neck on for 12 hours and off 12 hours at night.   potassium chloride SA 20 MEQ tablet Commonly known as: KLOR-CON Take 30 mEq by mouth daily.   predniSONE 1 MG tablet Commonly known as: DELTASONE Take 4 mg by mouth daily with breakfast.   Rewetting Drops Soln 1 drop by Does not apply route every 6 (six) hours as needed.   Rivaroxaban 15 MG Tabs tablet Commonly known as: XARELTO Take 15 mg by mouth daily with supper.   vitamin C 500 MG tablet Commonly known as: ASCORBIC ACID Take 500 mg by mouth  daily.       Review of Systems  Constitutional: Positive for activity change and fatigue. Negative for appetite change and fever.  HENT: Positive for hearing loss. Negative for congestion and voice change.   Eyes: Negative for visual disturbance.  Respiratory: Positive for shortness of breath. Negative for cough and wheezing.        DOE  Cardiovascular: Positive for leg swelling. Negative for chest pain.  Gastrointestinal: Negative for abdominal pain and constipation.  Genitourinary: Negative for difficulty urinating, dysuria and urgency.  Musculoskeletal: Positive for arthralgias, gait problem and myalgias.  Skin: Negative for color change.       Lateral left lower leg wound is scabbed over.   Neurological: Negative for speech difficulty, weakness and headaches.       Memory lapses.   Psychiatric/Behavioral: Negative for behavioral problems and sleep disturbance. The patient is not nervous/anxious.     Immunization History  Administered Date(s) Administered  . Influenza Whole 09/14/2018  . Influenza, High Dose  Seasonal PF 09/15/2019  . Influenza-Unspecified 09/13/2015, 09/29/2016, 10/03/2017, 09/23/2020  . Moderna Sars-Covid-2 Vaccination 12/15/2019, 01/12/2020  . Pneumococcal Conjugate-13 10/03/2017  . Pneumococcal-Unspecified 05/14/2015  . Tdap 04/13/2011   Pertinent  Health Maintenance Due  Topic Date Due  . INFLUENZA VACCINE  07/13/2021  . DEXA SCAN  Completed  . PNA vac Low Risk Adult  Completed   Fall Risk  09/01/2018 08/23/2017 08/12/2016  Falls in the past year? No No No   Functional Status Survey:    Vitals:   04/06/21 1152  BP: 140/74  Pulse: 70  Resp: 18  Temp: 98.6 F (37 C)  SpO2: 94%   There is no height or weight on file to calculate BMI. Physical Exam Vitals and nursing note reviewed.  Constitutional:      Comments: Appears tired, but easily to be aroused, smiling, said she feels well.   HENT:     Head: Normocephalic and atraumatic.     Mouth/Throat:     Mouth: Mucous membranes are moist.  Eyes:     Extraocular Movements: Extraocular movements intact.     Conjunctiva/sclera: Conjunctivae normal.     Pupils: Pupils are equal, round, and reactive to light.  Cardiovascular:     Rate and Rhythm: Normal rate. Rhythm irregular.     Heart sounds: No murmur heard.   Pulmonary:     Effort: Pulmonary effort is normal.     Breath sounds: Rales present.     Comments: Posterior right base.  Abdominal:     General: Bowel sounds are normal.     Palpations: Abdomen is soft.     Tenderness: There is no abdominal tenderness.  Musculoskeletal:     Cervical back: Normal range of motion.     Right lower leg: Edema present.     Left lower leg: Edema present.     Comments: trace edema BLE. Limited ROM R+L shoulders.   Skin:    General: Skin is warm and dry.     Comments: Chronic venous insufficiency skin changes BLE. Lateral left lower leg skin tear area developed into a stasis ulcer is scabbed over now.   Neurological:     General: No focal deficit present.     Mental  Status: She is alert. Mental status is at baseline.     Gait: Gait abnormal.     Comments: Oriented to person, place.   Psychiatric:        Mood and Affect: Mood normal.  Behavior: Behavior normal.        Thought Content: Thought content normal.        Judgment: Judgment normal.     Labs reviewed: Recent Labs    06/17/20 0000 09/10/20 0000 10/21/20 0000 02/05/21 0000  NA  --  138 138 139  K  --  4.2 3.8 4.4  CL  --  101 97* 105  CO2  --  28* 32* 27*  BUN  --  21 37* 30*  CREATININE  --  0.9 1.2* 0.9  CALCIUM  --  9.5 10.2 9.2  MG 2.1  --   --   --    Recent Labs    05/21/20 0000 02/05/21 0000  AST 21 19  ALT 15 10  ALKPHOS 54 46  ALBUMIN 4.2 3.6   Recent Labs    05/21/20 0000 02/05/21 0000 02/12/21 0000  WBC 5.6 6.1 6.8  NEUTROABS 3,478 3,532.00  --   HGB 13.4 10.9* 12.5  HCT 40 33* 38  PLT 140* 133* 166   Lab Results  Component Value Date   TSH 1.82 03/07/2018   Lab Results  Component Value Date   HGBA1C 5.4 05/17/2017   Lab Results  Component Value Date   CHOL 148 02/14/2020   HDL 57 02/14/2020   LDLCALC 71 02/14/2020   TRIG 121 02/14/2020   CHOLHDL 2.4 05/04/2016    Significant Diagnostic Results in last 30 days:  No results found.  Assessment/Plan: Generalized weakness generalized weakness, but the patient stated she feels well, denied cough, dysuria, O2 desaturation, she is afebrile. Will update CBC/diff, CMP/eGFR   Cervicalgia  Chronic neck pain/cervicalgia,08/14/16 X-ray C spine: anterior interbody fusion cervical spine, severe degenerative disc disease C7-T1, has shoulder prosthesis.   2019 right side neck pain, inj  02/05/19 Ortho  12/25/20 the patient declined Gabapentin 167m qd.  Failed trial of Prednisone.   Chronic diastolic CHF (congestive heart failure), NYHA class 3 (HCC)  Chronic venous insufficiency BLE CHF/edema BLE, on Furosemide6663mqd.EF 65% 2017. Creat 1.13 eGFR 43 03/25/21.   Added Metolazone  2.63m363mWF 03/16/21  04/06/21 improved swelling in legs, open wounds area are scabbed over, chronic venous insufficiency skin changes at her baseline, will decrease Metolazone to 2.63mg61m/wk    Atrial fibrillation (HCC) Afib, heart rate is in control, on Diltiazem 120mg70m Metoprolol 150mg 44mXarelto 163mg q22mrdiology f/u    Essential hypertension blood pressure is controlled, on Enalapril 20mg bi64mong with Metoprolol and Diltiazem.    Rheumatoid arthritis (HCC) RA, positive FAF, pain, multiples sites, on Prednisone 4mg qd, 463menol 1000mg bid,71mmg qd.f/62meumatology    Family/ staff Communication: plan of care reviewed with the patient and charge   Labs/tests ordered: CBC/diff CMP/eGFR  Time spend 40 minutes.

## 2021-04-06 NOTE — Assessment & Plan Note (Signed)
blood pressure is controlled, on Enalapril 20mg bid along with Metoprolol and Diltiazem.   

## 2021-04-07 ENCOUNTER — Encounter: Payer: Self-pay | Admitting: Nurse Practitioner

## 2021-04-07 DIAGNOSIS — R600 Localized edema: Secondary | ICD-10-CM | POA: Diagnosis not present

## 2021-04-07 LAB — CBC AND DIFFERENTIAL
HCT: 33 — AB (ref 36–46)
Hemoglobin: 11 — AB (ref 12.0–16.0)
Neutrophils Absolute: 2602
Platelets: 154 (ref 150–399)
WBC: 5.3

## 2021-04-07 LAB — COMPREHENSIVE METABOLIC PANEL
Albumin: 3.8 (ref 3.5–5.0)
Calcium: 10.9 — AB (ref 8.7–10.7)
GFR calc Af Amer: 44
GFR calc non Af Amer: 38
Globulin: 2.1

## 2021-04-07 LAB — BASIC METABOLIC PANEL
BUN: 45 — AB (ref 4–21)
CO2: 30 — AB (ref 13–22)
Chloride: 95 — AB (ref 99–108)
Creatinine: 1.2 — AB (ref 0.5–1.1)
Glucose: 78
Potassium: 3.5 (ref 3.4–5.3)
Sodium: 135 — AB (ref 137–147)

## 2021-04-07 LAB — HEPATIC FUNCTION PANEL
ALT: 11 (ref 7–35)
AST: 23 (ref 13–35)
Alkaline Phosphatase: 54 (ref 25–125)

## 2021-04-07 LAB — CBC: RBC: 3.65 — AB (ref 3.87–5.11)

## 2021-04-21 DIAGNOSIS — D229 Melanocytic nevi, unspecified: Secondary | ICD-10-CM | POA: Diagnosis not present

## 2021-04-21 DIAGNOSIS — L570 Actinic keratosis: Secondary | ICD-10-CM | POA: Diagnosis not present

## 2021-04-21 DIAGNOSIS — Z85828 Personal history of other malignant neoplasm of skin: Secondary | ICD-10-CM | POA: Diagnosis not present

## 2021-04-21 DIAGNOSIS — L814 Other melanin hyperpigmentation: Secondary | ICD-10-CM | POA: Diagnosis not present

## 2021-05-07 ENCOUNTER — Non-Acute Institutional Stay: Payer: PPO | Admitting: Nurse Practitioner

## 2021-05-07 ENCOUNTER — Emergency Department (HOSPITAL_COMMUNITY): Payer: PPO

## 2021-05-07 ENCOUNTER — Encounter: Payer: Self-pay | Admitting: Nurse Practitioner

## 2021-05-07 ENCOUNTER — Emergency Department (HOSPITAL_COMMUNITY)
Admission: EM | Admit: 2021-05-07 | Discharge: 2021-05-08 | Disposition: A | Discharge status: Home / Self Care | Payer: PPO | Attending: Emergency Medicine | Admitting: Emergency Medicine

## 2021-05-07 ENCOUNTER — Encounter (HOSPITAL_COMMUNITY): Payer: Self-pay | Admitting: Emergency Medicine

## 2021-05-07 ENCOUNTER — Other Ambulatory Visit: Payer: Self-pay

## 2021-05-07 DIAGNOSIS — Z96651 Presence of right artificial knee joint: Secondary | ICD-10-CM | POA: Diagnosis not present

## 2021-05-07 DIAGNOSIS — M79671 Pain in right foot: Secondary | ICD-10-CM | POA: Diagnosis not present

## 2021-05-07 DIAGNOSIS — I4821 Permanent atrial fibrillation: Secondary | ICD-10-CM

## 2021-05-07 DIAGNOSIS — R220 Localized swelling, mass and lump, head: Secondary | ICD-10-CM | POA: Diagnosis not present

## 2021-05-07 DIAGNOSIS — Z515 Encounter for palliative care: Secondary | ICD-10-CM

## 2021-05-07 DIAGNOSIS — X58XXXA Exposure to other specified factors, initial encounter: Secondary | ICD-10-CM | POA: Insufficient documentation

## 2021-05-07 DIAGNOSIS — R404 Transient alteration of awareness: Secondary | ICD-10-CM | POA: Diagnosis not present

## 2021-05-07 DIAGNOSIS — M19012 Primary osteoarthritis, left shoulder: Secondary | ICD-10-CM | POA: Diagnosis not present

## 2021-05-07 DIAGNOSIS — J439 Emphysema, unspecified: Secondary | ICD-10-CM | POA: Diagnosis not present

## 2021-05-07 DIAGNOSIS — I130 Hypertensive heart and chronic kidney disease with heart failure and stage 1 through stage 4 chronic kidney disease, or unspecified chronic kidney disease: Secondary | ICD-10-CM | POA: Insufficient documentation

## 2021-05-07 DIAGNOSIS — Z87891 Personal history of nicotine dependence: Secondary | ICD-10-CM | POA: Insufficient documentation

## 2021-05-07 DIAGNOSIS — M0579 Rheumatoid arthritis with rheumatoid factor of multiple sites without organ or systems involvement: Secondary | ICD-10-CM

## 2021-05-07 DIAGNOSIS — R531 Weakness: Secondary | ICD-10-CM | POA: Diagnosis not present

## 2021-05-07 DIAGNOSIS — M4802 Spinal stenosis, cervical region: Secondary | ICD-10-CM

## 2021-05-07 DIAGNOSIS — M791 Myalgia, unspecified site: Secondary | ICD-10-CM | POA: Insufficient documentation

## 2021-05-07 DIAGNOSIS — M25512 Pain in left shoulder: Secondary | ICD-10-CM | POA: Diagnosis not present

## 2021-05-07 DIAGNOSIS — M1990 Unspecified osteoarthritis, unspecified site: Secondary | ICD-10-CM | POA: Diagnosis not present

## 2021-05-07 DIAGNOSIS — R519 Headache, unspecified: Secondary | ICD-10-CM | POA: Diagnosis not present

## 2021-05-07 DIAGNOSIS — Z79899 Other long term (current) drug therapy: Secondary | ICD-10-CM | POA: Diagnosis not present

## 2021-05-07 DIAGNOSIS — T148XXA Other injury of unspecified body region, initial encounter: Secondary | ICD-10-CM | POA: Diagnosis not present

## 2021-05-07 DIAGNOSIS — M25551 Pain in right hip: Secondary | ICD-10-CM | POA: Insufficient documentation

## 2021-05-07 DIAGNOSIS — R222 Localized swelling, mass and lump, trunk: Secondary | ICD-10-CM | POA: Diagnosis not present

## 2021-05-07 DIAGNOSIS — I5032 Chronic diastolic (congestive) heart failure: Secondary | ICD-10-CM

## 2021-05-07 DIAGNOSIS — M25552 Pain in left hip: Secondary | ICD-10-CM | POA: Insufficient documentation

## 2021-05-07 DIAGNOSIS — Z981 Arthrodesis status: Secondary | ICD-10-CM | POA: Diagnosis not present

## 2021-05-07 DIAGNOSIS — N183 Chronic kidney disease, stage 3 unspecified: Secondary | ICD-10-CM | POA: Diagnosis not present

## 2021-05-07 DIAGNOSIS — I872 Venous insufficiency (chronic) (peripheral): Secondary | ICD-10-CM

## 2021-05-07 DIAGNOSIS — M542 Cervicalgia: Secondary | ICD-10-CM | POA: Diagnosis not present

## 2021-05-07 DIAGNOSIS — M25511 Pain in right shoulder: Secondary | ICD-10-CM | POA: Diagnosis not present

## 2021-05-07 DIAGNOSIS — Z20822 Contact with and (suspected) exposure to covid-19: Secondary | ICD-10-CM | POA: Insufficient documentation

## 2021-05-07 DIAGNOSIS — I10 Essential (primary) hypertension: Secondary | ICD-10-CM | POA: Diagnosis not present

## 2021-05-07 DIAGNOSIS — Z7901 Long term (current) use of anticoagulants: Secondary | ICD-10-CM | POA: Insufficient documentation

## 2021-05-07 DIAGNOSIS — M19071 Primary osteoarthritis, right ankle and foot: Secondary | ICD-10-CM | POA: Diagnosis not present

## 2021-05-07 DIAGNOSIS — R52 Pain, unspecified: Secondary | ICD-10-CM

## 2021-05-07 DIAGNOSIS — R0602 Shortness of breath: Secondary | ICD-10-CM | POA: Diagnosis not present

## 2021-05-07 DIAGNOSIS — M545 Low back pain, unspecified: Secondary | ICD-10-CM | POA: Diagnosis not present

## 2021-05-07 LAB — URINALYSIS, ROUTINE W REFLEX MICROSCOPIC
Bilirubin Urine: NEGATIVE
Glucose, UA: NEGATIVE mg/dL
Hgb urine dipstick: NEGATIVE
Ketones, ur: NEGATIVE mg/dL
Leukocytes,Ua: NEGATIVE
Nitrite: NEGATIVE
Protein, ur: NEGATIVE mg/dL
Specific Gravity, Urine: 1.016 (ref 1.005–1.030)
pH: 6 (ref 5.0–8.0)

## 2021-05-07 LAB — CBC WITH DIFFERENTIAL/PLATELET
Abs Immature Granulocytes: 0.04 10*3/uL (ref 0.00–0.07)
Basophils Absolute: 0 10*3/uL (ref 0.0–0.1)
Basophils Relative: 0 %
Eosinophils Absolute: 0 10*3/uL (ref 0.0–0.5)
Eosinophils Relative: 0 %
HCT: 31.5 % — ABNORMAL LOW (ref 36.0–46.0)
Hemoglobin: 10 g/dL — ABNORMAL LOW (ref 12.0–15.0)
Immature Granulocytes: 1 %
Lymphocytes Relative: 20 %
Lymphs Abs: 1.2 10*3/uL (ref 0.7–4.0)
MCH: 29.6 pg (ref 26.0–34.0)
MCHC: 31.7 g/dL (ref 30.0–36.0)
MCV: 93.2 fL (ref 80.0–100.0)
Monocytes Absolute: 0.9 10*3/uL (ref 0.1–1.0)
Monocytes Relative: 15 %
Neutro Abs: 3.7 10*3/uL (ref 1.7–7.7)
Neutrophils Relative %: 64 %
Platelets: 158 10*3/uL (ref 150–400)
RBC: 3.38 MIL/uL — ABNORMAL LOW (ref 3.87–5.11)
RDW: 13.8 % (ref 11.5–15.5)
WBC: 5.8 10*3/uL (ref 4.0–10.5)
nRBC: 0 % (ref 0.0–0.2)

## 2021-05-07 LAB — RESP PANEL BY RT-PCR (FLU A&B, COVID) ARPGX2
Influenza A by PCR: NEGATIVE
Influenza B by PCR: NEGATIVE
SARS Coronavirus 2 by RT PCR: NEGATIVE

## 2021-05-07 LAB — COMPREHENSIVE METABOLIC PANEL
ALT: 14 U/L (ref 0–44)
AST: 25 U/L (ref 15–41)
Albumin: 3.5 g/dL (ref 3.5–5.0)
Alkaline Phosphatase: 52 U/L (ref 38–126)
Anion gap: 9 (ref 5–15)
BUN: 36 mg/dL — ABNORMAL HIGH (ref 8–23)
CO2: 29 mmol/L (ref 22–32)
Calcium: 9.6 mg/dL (ref 8.9–10.3)
Chloride: 96 mmol/L — ABNORMAL LOW (ref 98–111)
Creatinine, Ser: 0.82 mg/dL (ref 0.44–1.00)
GFR, Estimated: >60 mL/min (ref >60)
Glucose, Bld: 88 mg/dL (ref 70–99)
Potassium: 4 mmol/L (ref 3.5–5.1)
Sodium: 134 mmol/L — ABNORMAL LOW (ref 135–145)
Total Bilirubin: 0.5 mg/dL (ref 0.3–1.2)
Total Protein: 6.4 g/dL — ABNORMAL LOW (ref 6.5–8.1)

## 2021-05-07 LAB — LACTIC ACID, PLASMA: Lactic Acid, Venous: 1.2 mmol/L (ref 0.5–1.9)

## 2021-05-07 MED ORDER — LORATADINE 10 MG PO TABS
10.0000 mg | ORAL_TABLET | Freq: Every day | ORAL | Status: DC
  Administered 2021-05-07: 10 mg via ORAL
  Filled 2021-05-07 (×2): qty 1

## 2021-05-07 MED ORDER — FENTANYL CITRATE (PF) 100 MCG/2ML IJ SOLN
50.0000 ug | Freq: Once | INTRAMUSCULAR | Status: AC
Start: 2021-05-07 — End: 2021-05-07
  Administered 2021-05-07: 50 ug via NASAL
  Filled 2021-05-07: qty 2

## 2021-05-07 MED ORDER — POTASSIUM CHLORIDE CRYS ER 20 MEQ PO TBCR
30.0000 meq | EXTENDED_RELEASE_TABLET | Freq: Every day | ORAL | Status: DC
  Administered 2021-05-07: 30 meq via ORAL
  Filled 2021-05-07 (×2): qty 1

## 2021-05-07 MED ORDER — ENALAPRIL MALEATE 10 MG PO TABS
20.0000 mg | ORAL_TABLET | Freq: Two times a day (BID) | ORAL | Status: DC
  Administered 2021-05-07 – 2021-05-08 (×2): 20 mg via ORAL
  Filled 2021-05-07 (×3): qty 2

## 2021-05-07 MED ORDER — BENEFIBER PO POWD: 4.0000 | Freq: Every day | ORAL | Status: DC

## 2021-05-07 MED ORDER — DILTIAZEM HCL ER COATED BEADS 120 MG PO CP24
120.0000 mg | ORAL_CAPSULE | Freq: Every day | ORAL | Status: DC
  Administered 2021-05-07 – 2021-05-08 (×2): 120 mg via ORAL
  Filled 2021-05-07 (×2): qty 1

## 2021-05-07 MED ORDER — RIVAROXABAN 15 MG PO TABS
15.0000 mg | ORAL_TABLET | Freq: Every day | ORAL | Status: DC
  Administered 2021-05-07 – 2021-05-08 (×2): 15 mg via ORAL
  Filled 2021-05-07 (×3): qty 1

## 2021-05-07 MED ORDER — ONDANSETRON 4 MG PO TBDP
4.0000 mg | ORAL_TABLET | Freq: Once | ORAL | Status: AC
  Administered 2021-05-07: 4 mg via ORAL
  Filled 2021-05-07: qty 1

## 2021-05-07 MED ORDER — NALOXONE HCL 0.4 MG/ML IJ SOLN
0.4000 mg | Freq: Once | INTRAMUSCULAR | Status: AC
  Administered 2021-05-07: 0.4 mg via INTRAVENOUS
  Filled 2021-05-07: qty 1

## 2021-05-07 MED ORDER — DILTIAZEM HCL ER COATED BEADS 120 MG PO CP24: 120.0000 mg | ORAL_CAPSULE | Freq: Every day | ORAL | Status: DC

## 2021-05-07 MED ORDER — ACETAMINOPHEN 500 MG PO TABS
500.0000 mg | ORAL_TABLET | ORAL | Status: DC
  Administered 2021-05-07: 500 mg via ORAL
  Filled 2021-05-07: qty 1

## 2021-05-07 MED ORDER — LIDOCAINE 5 % EX PTCH: 1.0000 | MEDICATED_PATCH | Freq: Two times a day (BID) | CUTANEOUS | Status: DC | PRN

## 2021-05-07 MED ORDER — ACETAMINOPHEN 325 MG PO TABS: 650.0000 mg | ORAL_TABLET | Freq: Once | ORAL | Status: DC

## 2021-05-07 MED ORDER — FUROSEMIDE 40 MG PO TABS
60.0000 mg | ORAL_TABLET | Freq: Every day | ORAL | Status: DC
  Administered 2021-05-07 – 2021-05-08 (×2): 60 mg via ORAL
  Filled 2021-05-07 (×2): qty 2

## 2021-05-07 MED ORDER — NALOXONE HCL 4 MG/0.1ML NA LIQD: 0.4000 mg | Freq: Once | NASAL | Status: DC

## 2021-05-07 MED ORDER — METOLAZONE 2.5 MG PO TABS
2.5000 mg | ORAL_TABLET | ORAL | Status: DC
  Filled 2021-05-07: qty 1

## 2021-05-07 MED ORDER — REWETTING DROPS SOLN: 1.0000 [drp] | Freq: Four times a day (QID) | Status: DC | PRN

## 2021-05-07 MED ORDER — POLYVINYL ALCOHOL 1.4 % OP SOLN: 1.0000 [drp] | OPHTHALMIC | Status: DC | PRN

## 2021-05-07 MED ORDER — ASCORBIC ACID 500 MG PO TABS
500.0000 mg | ORAL_TABLET | Freq: Every day | ORAL | Status: DC
  Administered 2021-05-07: 500 mg via ORAL
  Filled 2021-05-07 (×2): qty 1

## 2021-05-07 MED ORDER — METOPROLOL SUCCINATE ER 50 MG PO TB24: 50.0000 mg | ORAL_TABLET | Freq: Every day | ORAL | Status: DC

## 2021-05-07 MED ORDER — METOPROLOL SUCCINATE ER 50 MG PO TB24
150.0000 mg | ORAL_TABLET | Freq: Every day | ORAL | Status: DC
  Administered 2021-05-07: 150 mg via ORAL
  Filled 2021-05-07: qty 3

## 2021-05-07 MED ORDER — CALCIUM CARBONATE 1250 (500 CA) MG PO TABS
1250.0000 mg | ORAL_TABLET | Freq: Every day | ORAL | Status: DC
  Filled 2021-05-07 (×2): qty 1

## 2021-05-07 MED ORDER — MAGNESIUM OXIDE -MG SUPPLEMENT 400 (240 MG) MG PO TABS
200.0000 mg | ORAL_TABLET | Freq: Every day | ORAL | Status: DC
  Filled 2021-05-07 (×2): qty 1

## 2021-05-07 MED ORDER — METOPROLOL SUCCINATE ER 50 MG PO TB24: 100.0000 mg | ORAL_TABLET | Freq: Every day | ORAL | Status: DC

## 2021-05-07 MED ORDER — PSYLLIUM 95 % PO PACK
2.0000 | PACK | Freq: Every day | ORAL | Status: DC
  Administered 2021-05-07: 2 via ORAL
  Filled 2021-05-07 (×2): qty 2

## 2021-05-07 MED ORDER — PREDNISONE 1 MG PO TABS
4.0000 mg | ORAL_TABLET | Freq: Every day | ORAL | Status: DC
  Administered 2021-05-07: 4 mg via ORAL
  Filled 2021-05-07 (×2): qty 4

## 2021-05-07 NOTE — Assessment & Plan Note (Signed)
Chronic venous insufficiency BLE  CHF/edema BLE, on Furosemide49m qd.EF 65% 2017. Creat 1.13 eGFR 43 03/25/21.  Metolazone 2.5104mMWF 03/16/21

## 2021-05-07 NOTE — Assessment & Plan Note (Signed)
positive FAF,pain, multiples sites, on Prednisone 4mg  qd, Tylenol 1000mg  bid, 500mg  qd.f/u Rheumatology

## 2021-05-07 NOTE — ED Triage Notes (Signed)
Per EMS pt from [place] was in assisted living and being moved to skilled tomorrow. Daughter came to check on her and pt was laying in bed crying due to being in bed. Pt was unable to transfer by pivot yesterday and was assisted to floor.  120/68, 72HR, 95% on room air.

## 2021-05-07 NOTE — Progress Notes (Signed)
RT NOTE:  RT called for possible BiPAP placement due to low saturations. Pt was on 9L NRB when RT entered room. Pulse ox waveforms were dampened reading 72%. RT increased NRB to 15L and pulse ox was changed to a different finger and pt saturations started reading 100%. RT waited a few minutes in pt room to see if it would drop, when it did not RT placed pt on 4L [place], pt remained at 100%, RT weaned O2 down to 2L [place] with saturations still at 100%, pt WOB was normal with a RR of 17. RT spoke with Rogene [place], MD about the changes and RT was told to hold off on the BiPAP.

## 2021-05-07 NOTE — Assessment & Plan Note (Signed)
heart rate is in control, on Diltiazem 120mg  qd, Metoprolol 150mg  qd, Xarelto 15mg  qd.Cardiologyf/u

## 2021-05-07 NOTE — Assessment & Plan Note (Signed)
Chronic neck pain/cervicalgia,08/14/16 X-ray C spine: anterior interbody fusion cervical spine, severe degenerative disc disease C7-T1, has shoulder prosthesis.  2019 right side neck pain, inj 02/05/19 Ortho 12/25/20 the patient declined Gabapentin 100mg  qd.  Failed trial of Prednisone.

## 2021-05-07 NOTE — ED Provider Notes (Signed)
[facility] DEPT Provider Note   CSN: [id number] Arrival date & time: 05/07/21  1134     History Chief Complaint  Patient presents with  . Generalized Body Aches    [name] is a 87 y.o. female.  87 year old female brought in by EMS from assisted living facility with daughter at bedside, plan to move to skilled living tomorrow however daughter arrived at the facility today to help with packing and noticed patient had not been able to get out of bed secondary to pain.  Reports patient was taking a shower yesterday with assistance when she slipped and nearly fell and was lowered down by staff.  No significant injuries at that time, history of chronic pain in neck and shoulders and wonders if something he got injured in the fall versus helping her get back up.  Patient reports pain in her right foot, hips, shoulders.  Nuys abdominal or chest pain.  No other complaints or concerns.        Past Medical History:  Diagnosis Date  . Cervicalgia 08/12/2016  . CHF (congestive heart failure) ([place])   . Chronic diastolic CHF (congestive heart failure), NYHA class 3 ([place]) 06/17/2016  . Complication of anesthesia   . Edema 08/12/2016  . Hypertension   . New onset atrial fibrillation ([place]) 04/2016   a. started on Xarelto and Cardizem  . PONV (postoperative nausea and vomiting)   . RA (rheumatoid arthritis) ([place])   . SCC (squamous cell carcinoma) KA 09/15/2016   Left forearm - tx p bx  . SOB (shortness of breath) 04/2016   Hospitalist  . Vertigo     Patient Active Problem List   Diagnosis Date Noted  . Generalized weakness 04/06/2021  . Venous stasis ulcer of left lower leg with edema of left lower leg ([place]) 03/16/2021  . Chronic venous stasis dermatitis of left lower extremity 12/25/2020  . CKD (chronic kidney disease) stage 3, GFR 30-59 ml/min ([facility]) 09/11/2020  . Cellulitis of leg without foot, left 08/22/2019  . Incontinent of urine 04/02/2019  .  Allergic rhinitis 12/04/2018  . Unsteady gait 03/02/2018  . Constipation 03/03/2017  . Depression with anxiety 01/26/2017  . Hyperlipidemia 12/27/2016  . Edema leg 09/22/2016  . Keratoacanthoma 08/12/2016  . Osteoarthritis 08/12/2016  . Cervicalgia 08/12/2016  . Chronic anticoagulation 07/29/2016  . Chronic diastolic CHF (congestive heart failure), NYHA class 3 ([place]) 06/17/2016  . Atrial fibrillation ([place]) 05/03/2016  . Abnormal chest CT 05/03/2016  . Syncope 11/03/2014  . Essential hypertension 11/03/2014  . Rheumatoid arthritis ([place]) 11/03/2014    Past Surgical History:  Procedure Laterality Date  . Biopsy right breast Right    fibrocystic changes  . CARDIOVERSION N/A 07/08/2016   Procedure: CARDIOVERSION;  Surgeon: Larey Dresser, MD;  Location: [facility];  Service: Cardiovascular;  Laterality: N/A;  . CERVICAL SPINE SURGERY  2005   Dr. Vertell Limber  . HYSTEROSCOPY  2001  . JOINT REPLACEMENT    . REPLACEMENT TOTAL KNEE Right 1996   [facility] Bilateral [id number]   Baird Lyons MD     OB History   No obstetric history on file.     Family History  Problem Relation Age of Onset  . Cancer Sister        brain  . Arthritis Daughter   . Hypothyroidism Daughter   . Hypertension Son   . Arthritis Son     Social History   Tobacco Use  . Smoking  status: Former Smoker    Quit date: 12/13/1966    Years since quitting: 54.4  . Smokeless tobacco: Never Used  Vaping Use  . Vaping Use: Never used  Substance Use Topics  . Alcohol use: No  . Drug use: No    Home Medications Prior to Admission medications   Medication Sig Start Date End Date Taking? Authorizing Provider  acetaminophen (TYLENOL) 500 MG tablet Take 500-1,000 mg by mouth as directed. Take 2 tablets (1000 mg) BID and Take 1 tablet (500 mg) at bedtime   Yes [provider]  amoxicillin (AMOXIL) 500 MG tablet Take 2,000 mg by mouth as needed. Take 1 hour prior to dental  appointment.   Yes [provider]  calcium carbonate (OSCAL) 1500 (600 Ca) MG TABS tablet Take 1,500 mg by mouth daily. OTC   Yes [provider]  dextromethorphan-guaiFENesin (MUCINEX DM) 30-600 MG 12hr tablet Take 1 tablet by mouth 2 (two) times daily as needed for cough.   Yes [provider]  diltiazem (CARDIZEM CD) 120 MG 24 hr capsule Take 1 capsule (120 mg total) by mouth daily. 05/13/16  Yes Barrett, Evelene Croon, PA-C  enalapril (VASOTEC) 20 MG tablet Take 20 mg by mouth 2 (two) times daily.   Yes [provider]  furosemide (LASIX) 20 MG tablet Take 60 mg by mouth daily. 12/02/16  Yes [provider]  Lidocaine (PAIN RELIEF MAXIMUM STRENGTH) 4 % PTCH Apply 1 patch topically every 12 (twelve) hours as needed (pain). Apply patch to right side of neck on for 12 hours and off 12 hours at night.   Yes [provider]  loratadine (CLARITIN) 10 MG tablet Take 10 mg by mouth daily.   Yes [provider]  Magnesium Oxide 250 MG TABS Take 250 mg by mouth daily.   Yes [provider]  Menthol 10 % AERO Apply topically in the morning, at noon, in the evening, and at bedtime. Apply cream to right side of neck.   Yes [provider]  metolazone (ZAROXOLYN) 2.5 MG tablet Take 2.5 mg by mouth as directed. Once a day on MON, FRI   Yes [provider]  metoprolol succinate (TOPROL-XL) 100 MG 24 hr tablet Take 100 mg by mouth at bedtime. Take along with 50 mg tablet=150 mg   Yes [provider]  metoprolol succinate (TOPROL-XL) 50 MG 24 hr tablet Take 50 mg by mouth at bedtime. Take along with 100 mg tablet=150 mg   Yes [provider]  OXYGEN Inhale 2 L into the lungs daily as needed (shortness of breath). Use 2 liter as needed for SOB, 02 sat < 90% on room air   Yes [provider]  potassium chloride SA (K-DUR,KLOR-CON) 20 MEQ tablet Take 30 mEq by mouth daily.    Yes [provider]   predniSONE (DELTASONE) 1 MG tablet Take 4 mg by mouth daily with breakfast.   Yes [provider]  Rivaroxaban (XARELTO) 15 MG TABS tablet Take 15 mg by mouth daily with supper.   Yes [provider]  Soft Lens Products (REWETTING DROPS) SOLN 1 drop by Does not apply route every 6 (six) hours as needed.   Yes [provider]  vitamin C (ASCORBIC ACID) 500 MG tablet Take 500 mg by mouth daily.   Yes [provider]  Wheat Dextrin (BENEFIBER PO) Take 4 scoop by mouth daily.    Yes [provider]    Allergies  Morphine sulfate and Other  Review of Systems   Review of Systems  Constitutional: Negative for fever.  Respiratory: Negative for shortness of breath.   Cardiovascular: Negative for chest pain.  Gastrointestinal: Negative for abdominal pain, nausea and vomiting.  Musculoskeletal: Positive for arthralgias and back pain. Negative for joint swelling, neck pain and neck stiffness.  Skin: Negative for rash and wound.  Allergic/Immunologic: Negative for immunocompromised state.  Neurological: Negative for weakness.  Psychiatric/Behavioral: Negative for confusion.    Physical Exam Updated Vital Signs BP (!) 141/55   Pulse 83   Temp 97.9 F (36.6 C) (Oral)   Resp 19   SpO2 100%   Physical Exam Vitals and nursing note reviewed.  Constitutional:      General: She is not in acute distress.    Appearance: She is well-developed. She is not diaphoretic.  HENT:     Head: Normocephalic and atraumatic.     Mouth/Throat:     Mouth: Mucous membranes are dry.  Eyes:     Conjunctiva/sclera: Conjunctivae normal.     Pupils: Pupils are equal, round, and reactive to light.  Cardiovascular:     Rate and Rhythm: Normal rate and regular rhythm.  Pulmonary:     Effort: Pulmonary effort is normal.     Breath sounds: Normal breath sounds.  Abdominal:     Palpations: Abdomen is soft.     Tenderness: There is no abdominal tenderness.   Musculoskeletal:        General: Tenderness present. No swelling or deformity.       Arms:     Cervical back: Neck supple. No tenderness or bony tenderness.     Thoracic back: No tenderness or bony tenderness.     Lumbar back: Bony tenderness present.       Back:       Legs:  Skin:    General: Skin is warm and dry.     Findings: Bruising present. No erythema or rash.  Neurological:     Mental Status: She is alert. Mental status is at baseline.     Sensory: No sensory deficit.  Psychiatric:        Behavior: Behavior normal.     ED Results / Procedures / Treatments   Labs (all labs ordered are listed, but only abnormal results are displayed) Labs Reviewed  CBC WITH DIFFERENTIAL/PLATELET - Abnormal; Notable for the following components:      Result Value   RBC 3.38 (*)    Hemoglobin 10.0 (*)    HCT 31.5 (*)    All other components within normal limits  COMPREHENSIVE METABOLIC PANEL - Abnormal; Notable for the following components:   Sodium 134 (*)    Chloride 96 (*)    BUN 36 (*)    Total Protein 6.4 (*)    All other components within normal limits  RESP PANEL BY RT-PCR (FLU A&B, COVID) ARPGX2  CULTURE, BLOOD (SINGLE)  URINALYSIS, ROUTINE W REFLEX MICROSCOPIC  LACTIC ACID, PLASMA  LACTIC ACID, PLASMA    EKG None  Radiology DG Lumbar Spine Complete  Result Date: 05/07/2021 CLINICAL DATA:  Recent fall with low back pain, initial encounter EXAM: LUMBAR SPINE - COMPLETE 4+ VIEW COMPARISON:  None. FINDINGS: Five lumbar type vertebral bodies are well visualized. Scoliosis concave to the right at the thoracolumbar junction is noted. No pars defects are seen. Multilevel disc space narrowing is noted. No compression deformities are seen. Soft tissue structures are within normal limits. IMPRESSION: Degenerative change and scoliosis  as described without acute abnormality. Electronically Signed   By: Inez Catalina M.D.   On: 05/07/2021 13:52   DG Shoulder Right  Result Date:  05/07/2021 CLINICAL DATA:  Recent fall with right shoulder pain, initial encounter EXAM: RIGHT SHOULDER - 2+ VIEW COMPARISON:  04/11/2008 FINDINGS: Right shoulder replacement is now seen. Degenerative changes of the glenoid are noted. The humeral head replacement is somewhat high-riding. Degenerative changes of the acromioclavicular joint are seen. No other focal abnormality is noted. IMPRESSION: Degenerative and postop change without acute abnormality. Electronically Signed   By: Inez Catalina M.D.   On: 05/07/2021 13:56   CT Head Wo Contrast  Result Date: 05/07/2021 CLINICAL DATA:  Diffuse pain.  Patient found down today. EXAM: CT HEAD WITHOUT CONTRAST CT CERVICAL SPINE WITHOUT CONTRAST TECHNIQUE: Multidetector CT imaging of the head and cervical spine was performed following the standard protocol without intravenous contrast. Multiplanar CT image reconstructions of the cervical spine were also generated. COMPARISON:  Brain MRI 08/12/2016. FINDINGS: CT HEAD FINDINGS Brain: No evidence of acute infarction, hemorrhage, hydrocephalus, extra-axial collection or mass lesion/mass effect. Vascular: No hyperdense vessel or unexpected calcification. Skull: Intact.  No focal lesion. Sinuses/Orbits: Negative. Other: None. CT CERVICAL SPINE FINDINGS Alignment: Alignment. Skull base and vertebrae: The patient is status post C3-5 fusion. There is bony destructive change in the inferior aspect of C3, throughout C4 and in the majority of C5. Destructive change involves the posterior elements on the right at each of these levels and the posterior elements on the left at C4. Soft tissue windows demonstrate a mass lesion extending out of the vertebral bodies into the central canal which appears to cause severe central canal stenosis. Soft tissues and spinal canal: As above. Disc levels: Marked loss of disc space height is present at C6-7. Scattered facet degenerative change noted. Upper chest: Lung apices demonstrate  emphysematous change. Other: None. IMPRESSION: Destructive process from C4 to C5 is centered in C4. A mass lesion extends into the central canal at these levels and appears to cause severe stenosis, worst at C4. Recommend cervical spine MRI with and without contrast further evaluation. Negative for trauma head or cervical spine. Emphysema (ICD10-J43.9). Electronically Signed   By: Inge Rise M.D.   On: 05/07/2021 14:17   CT CERVICAL SPINE WO CONTRAST  Result Date: 05/07/2021 CLINICAL DATA:  Diffuse pain.  Patient found down today. EXAM: CT HEAD WITHOUT CONTRAST CT CERVICAL SPINE WITHOUT CONTRAST TECHNIQUE: Multidetector CT imaging of the head and cervical spine was performed following the standard protocol without intravenous contrast. Multiplanar CT image reconstructions of the cervical spine were also generated. COMPARISON:  Brain MRI 08/12/2016. FINDINGS: CT HEAD FINDINGS Brain: No evidence of acute infarction, hemorrhage, hydrocephalus, extra-axial collection or mass lesion/mass effect. Vascular: No hyperdense vessel or unexpected calcification. Skull: Intact.  No focal lesion. Sinuses/Orbits: Negative. Other: None. CT CERVICAL SPINE FINDINGS Alignment: Alignment. Skull base and vertebrae: The patient is status post C3-5 fusion. There is bony destructive change in the inferior aspect of C3, throughout C4 and in the majority of C5. Destructive change involves the posterior elements on the right at each of these levels and the posterior elements on the left at C4. Soft tissue windows demonstrate a mass lesion extending out of the vertebral bodies into the central canal which appears to cause severe central canal stenosis. Soft tissues and spinal canal: As above. Disc levels: Marked loss of disc space height is present at C6-7. Scattered facet degenerative change noted. Upper chest: Lung  apices demonstrate emphysematous change. Other: None. IMPRESSION: Destructive process from C4 to C5 is centered in C4.  A mass lesion extends into the central canal at these levels and appears to cause severe stenosis, worst at C4. Recommend cervical spine MRI with and without contrast further evaluation. Negative for trauma head or cervical spine. Emphysema (ICD10-J43.9). Electronically Signed   By: Inge Rise M.D.   On: 05/07/2021 14:17   DG Chest Port 1 View  Result Date: 05/07/2021 CLINICAL DATA:  Shortness of breath. EXAM: PORTABLE CHEST 1 VIEW COMPARISON:  May 10, 2016. FINDINGS: Linear opacities at the left base, similar over multiple priors and likely scarring. No new consolidation. No visible pleural effusions or pneumothorax on this limited semi erect single radiograph. Enlarged cardiac silhouette, likely accentuated by AP technique. Partially imaged ACDF. Partially imaged bilateral shoulder arthroplasties. IMPRESSION: 1. No evidence of acute cardiopulmonary disease. 2. Linear opacities at the left base, similar over multiple priors and likely scarring. Electronically Signed   By: Margaretha Sheffield MD   On: 05/07/2021 14:59   DG Shoulder Left  Result Date: 05/07/2021 CLINICAL DATA:  Recent fall with left shoulder pain, initial encounter EXAM: LEFT SHOULDER - 2+ VIEW COMPARISON:  12/29/2017 FINDINGS: Left shoulder replacement is seen. The overall appearance is stable from the prior exam. Mild degenerative changes of the acromioclavicular joint are seen. Visualize ribcage is within normal limits. Changes of prior fusion in the cervical spine are noted. IMPRESSION: Postsurgical change without acute abnormality. Electronically Signed   By: Inez Catalina M.D.   On: 05/07/2021 13:54   DG Foot Complete Right  Result Date: 05/07/2021 CLINICAL DATA:  Recent fall with foot pain, initial encounter EXAM: RIGHT FOOT COMPLETE - 3+ VIEW COMPARISON:  None. FINDINGS: Hallux valgus deformity is noted. No acute fracture or dislocation is noted. No soft tissue abnormality is noted. Vascular calcifications are seen. IMPRESSION:  Degenerative change without acute abnormality. Electronically Signed   By: Inez Catalina M.D.   On: 05/07/2021 13:48   DG Hip Unilat With Pelvis 2-3 Views Left  Result Date: 05/07/2021 CLINICAL DATA:  Recent fall with left hip pain, initial encounter EXAM: DG HIP (WITH OR WITHOUT PELVIS) 3V LEFT COMPARISON:  None. FINDINGS: Pelvic ring is intact. Degenerative changes of lumbar spine are again noted. No acute fracture or dislocation is noted. Mild degenerative changes of the hip joints are seen. IMPRESSION: Degenerative change without acute abnormality. Electronically Signed   By: Inez Catalina M.D.   On: 05/07/2021 13:53    Procedures .Critical Care Performed by: Tacy Learn, PA-C Authorized by: Tacy Learn, PA-C   Critical care provider statement:    Critical care time (minutes):  45   Critical care was time spent personally by me on the following activities:  Discussions with consultants, evaluation of patient's response to treatment, examination of patient, ordering and performing treatments and interventions, ordering and review of laboratory studies, ordering and review of radiographic studies, pulse oximetry, re-evaluation of patient's condition, obtaining history from patient or surrogate and review of old charts     Medications Ordered in ED Medications  acetaminophen (TYLENOL) tablet 650 mg (has no administration in time range)  acetaminophen (TYLENOL) tablet 500-1,000 mg (has no administration in time range)  calcium carbonate (OSCAL) tablet 1,500 mg (has no administration in time range)  diltiazem (CARDIZEM CD) 24 hr capsule 120 mg (has no administration in time range)  enalapril (VASOTEC) tablet 20 mg (has no administration in time range)  furosemide (LASIX)  tablet 60 mg (has no administration in time range)  Lidocaine 4 % PTCH 1 patch (has no administration in time range)  loratadine (CLARITIN) tablet 10 mg (has no administration in time range)  Magnesium Oxide TABS 250  mg (has no administration in time range)  metolazone (ZAROXOLYN) tablet 2.5 mg (has no administration in time range)  metoprolol succinate (TOPROL-XL) 24 hr tablet 100 mg (has no administration in time range)  metoprolol succinate (TOPROL-XL) 24 hr tablet 50 mg (has no administration in time range)  potassium chloride (KLOR-CON) CR tablet 30 mEq (has no administration in time range)  predniSONE (DELTASONE) tablet 4 mg (has no administration in time range)  Rivaroxaban (XARELTO) tablet 15 mg (has no administration in time range)  Rewetting Drops SOLN 1 drop (has no administration in time range)  ascorbic acid (VITAMIN C) tablet 500 mg (has no administration in time range)  Benefiber POWD 4 Scoop (has no administration in time range)  ondansetron (ZOFRAN-ODT) disintegrating tablet 4 mg (4 mg Oral Given 05/07/21 1302)  fentaNYL (SUBLIMAZE) injection 50 mcg (50 mcg Nasal Given 05/07/21 1302)  naloxone (NARCAN) injection 0.4 mg (0.4 mg Intravenous Given 05/07/21 1450)    ED Course  I have reviewed the triage vital signs and the nursing notes.  Pertinent labs & imaging results that were available during my care of the patient were reviewed by me and considered in my medical decision making (see chart for details).  Clinical Course as of 05/07/21 1635  Thu May 07, 6085  6619 87 year old female brought in by daughter for body aches after a near fall at the nursing facility yesterday, with complaint of body aches today which kept her from getting out of bed.  Found to have pain in both shoulders (multiple prior surgeries), pain in the left hip, right foot, low back.  Patient was given intranasal fentanyl for pain prior to imaging.  CT head for question of fall yesterday on Eliquis, negative for acute injury. CT c-spine for pain with fall, shows mass lesion extending out of the vertebral bodies into the ventral canal causing severe central canal stenosis.  Remaining xr studies without acute findings.  [LM]  7322 On recheck, patient now with O2 sat 70-84% and placed on [place] without significant improvement. Dr. Rogene Houston to bedside  [LM]  1444 Initially improved with NRB to upper 90s however O2 sats are inconsistent and continue to trend into the 80s. No improvement with Narcan. Plan is for bipap while awaiting work up.  [LM]  0254 Patient was evaluated by RT for bipap, it was felt the pulse ox was faulty and was changed for a new one which now reports O2 at 100% on 2L [place].  Labs today are reassuring and unrevealing including a CBC with mild anemia with hemoglobin of 10, CMP without significant findings, lactic acid at 1.2, COVID-negative and urinalysis unremarkable.  Patient is unable to return to her assisted nursing facility today however is able to go to her skilled facility tomorrow.  Plan is to hold the patient in the ED for discharge to skilled facility tomorrow. [LM]    Clinical Course User Index [LM] Roque Lias   MDM Rules/Calculators/A&P                          Final Clinical Impression(s) / ED Diagnoses Final diagnoses:  Body aches    Rx / DC Orders ED Discharge Orders    None  Tacy Learn, PA-C 05/07/21 1635    Fredia Sorrow, MD 05/07/21 [phone]

## 2021-05-07 NOTE — Assessment & Plan Note (Signed)
lethargy, pain/aches allover, stayed in bed today. Her usual state of health is up in w/c daily. The patient is afebrile, skin and mouth appeared dry. Denied chest pain, SOB, nausea, vomiting, or dysuria. She is afebrile, no O2 desaturation.   HPOA the patient's daughter present, desires ED for further eval/tx.

## 2021-05-07 NOTE — Progress Notes (Signed)
Location:   [facility] Room Number: [id number] Place of Service:  ALF (13) Provider:  Man X Mast, NP    Patient Care Team: Gupta, Anjali L, MD as PCP - General (Internal Medicine) Jordan, Peter M, MD as PCP - Cardiology (Cardiology) Mast, Man X, NP as Nurse Practitioner (Internal Medicine) Jordan, Peter M, MD as Consulting Physician (Cardiology) Tafeen, Stuart, MD as Consulting Physician (Dermatology) Whitfield, Peter W, MD as Consulting Physician (Orthopedic Surgery)  Extended Emergency Contact Information Primary Emergency Contact: Stoneman,Jan Address: [address]          , [place] [address] [place] Home Phone: [phone] Mobile Phone: [phone] Relation: Daughter Secondary Emergency Contact: Robling,Steve  [place] Home Phone: [phone] Mobile Phone: [phone] Relation: Son  Code Status:  DNR Goals of care: Advanced Directive information Advanced Directives 05/07/2021  Does Patient Have a Medical Advance Directive? Yes  Type of Advance Directive Healthcare Power of Attorney;Living will;Out of facility DNR (pink MOST or yellow form)  Does patient want to make changes to medical advance directive? No - Patient declined  Copy of Healthcare Power of Attorney in Chart? Yes - validated most recent copy scanned in chart (See row information)  Would patient like information on creating a medical advance directive? -  Pre-existing out of facility DNR order (yellow form or pink MOST form) Yellow form placed in chart (order not valid for inpatient use)     Chief Complaint  Patient presents with  . Acute Visit    Patient complains of pain all over.     HPI:  Pt is a 87 y.o. female seen today for an acute visit for lethargy, pain/aches allover, stayed in bed today. Her usual state of health is up in w/c daily. The patient is afebrile, skin and mouth appeared dry. Denied chest pain, SOB, nausea, vomiting,  or dysuria. She is afebrile, no O2 desaturation.      Chronic neck pain/cervicalgia,08/14/16 X-ray C spine: anterior interbody fusion cervical spine, severe degenerative disc disease C7-T1, has shoulder prosthesis.  2019 right side neck pain, inj 02/05/19 Ortho             12/25/20 the patient declined Gabapentin 100mg qd.             Failed trial of Prednisone. Chronic venous insufficiency BLE CHF/edema BLE, on Furosemide60mg qd.EF 65% 2017. Creat 1.13 eGFR 43 03/25/21.  Metolazone 2.5mg MWF 03/16/21 Afib, heart rate is in control, on Diltiazem 120mg qd, Metoprolol 150mg qd, Xarelto 15mg qd.Cardiologyf/u HTN, heart rate is in control, on Diltiazem 120mg qd, Metoprolol 150mg qd, Xarelto 15mg qd.Cardiologyf/u  RA,positive FAF,pain, multiples sites, on Prednisone 4mg qd, Tylenol 1000mg bid, 500mg qd.f/u Rheumatology   Past Medical History:  Diagnosis Date  . Cervicalgia 08/12/2016  . CHF (congestive heart failure) (HCC)   . Chronic diastolic CHF (congestive heart failure), NYHA class 3 (HCC) 06/17/2016  . Complication of anesthesia   . Edema 08/12/2016  . Hypertension   . New onset atrial fibrillation (HCC) 04/2016   a. started on Xarelto and Cardizem  . PONV (postoperative nausea and vomiting)   . RA (rheumatoid arthritis) (HCC)   . SCC (squamous cell carcinoma) KA 09/15/2016   Left forearm - tx p bx  . SOB (shortness of breath) 04/2016   Hospitalist  . Vertigo    Past Surgical History:  Procedure Laterality Date  . Biopsy right breast Right    fibrocystic changes  . CARDIOVERSION   N/A 07/08/2016   Procedure: CARDIOVERSION;  Surgeon: Larey Dresser, MD;  Location: [facility];  Service: Cardiovascular;  Laterality: N/A;  . CERVICAL SPINE SURGERY  2005   Dr. Vertell Limber  . HYSTEROSCOPY  2001  . JOINT REPLACEMENT    . REPLACEMENT TOTAL KNEE Right 1996   [place] Bilateral [phone]   Baird Lyons MD    Allergies  Allergen Reactions  . Morphine Sulfate Other (See Comments)    "extremely nauseated"  . Other Nausea And Vomiting    Pt states she is very sensitive to pain medications.    Allergies as of 05/07/2021      Reactions   Morphine Sulfate Other (See Comments)   "extremely nauseated"   Other Nausea And Vomiting   Pt states she is very sensitive to pain medications.      Medication List       Accurate as of May 07, 2021  3:57 PM. If you have any questions, ask your nurse or doctor.        STOP taking these medications   mupirocin ointment 2 % Commonly known as: BACTROBAN Stopped by: Adanely Reynoso X Angelicia Lessner, NP     TAKE these medications   acetaminophen 500 MG tablet Commonly known as: TYLENOL Take 1,000 mg by mouth 2 (two) times daily.   acetaminophen 500 MG tablet Commonly known as: TYLENOL Take 500 mg by mouth at bedtime.   amoxicillin 500 MG tablet Commonly known as: AMOXIL Take 2,000 mg by mouth as needed. Take 1 hour prior to dental appointment.   BENEFIBER PO Take 4 scoop by mouth daily.   CALCIUM 600 PO Take 1and 1/2  Tablet = 900 mg in the evening with dinner .   calcium carbonate 1500 (600 Ca) MG Tabs tablet Commonly known as: OSCAL Take 1,500 mg by mouth daily. OTC   dextromethorphan-guaiFENesin 30-600 MG 12hr tablet Commonly known as: MUCINEX DM Take 1 tablet by mouth 2 (two) times daily as needed for cough.   diltiazem 120 MG 24 hr capsule Commonly known as: CARDIZEM CD Take 1 capsule (120 mg total) by mouth daily.   enalapril 20 MG tablet Commonly known as: VASOTEC Take 20 mg by mouth 2 (two) times daily.   furosemide 20 MG tablet Commonly known as: LASIX Take by mouth daily. Take 60 mg   loratadine 10 MG tablet Commonly known as: CLARITIN Take 10 mg by mouth daily.   Magnesium Oxide 250 MG Tabs Take 250 mg by mouth daily.   Menthol 10 % Aero Apply topically in the morning, at noon, in the evening, and at bedtime. Apply  cream to right side of neck.   metolazone 2.5 MG tablet Commonly known as: ZAROXOLYN Take 2.5 mg by mouth as directed. Once a day on MON, FRI   metoprolol succinate 50 MG 24 hr tablet Commonly known as: TOPROL-XL Take 50 mg by mouth. Give with Metoprolol 179m Once A Day   Metoprolol Succinate 100 MG Cs24 Take 150 mg by mouth at bedtime.   OXYGEN Inhale 2 L into the lungs daily as needed (shortness of breath). Use 2 liter as needed for SOB, 02 sat < 90% on room air   Pain Relief Maximum Strength 4 % Ptch Generic drug: Lidocaine Apply 1 patch topically every 12 (twelve) hours as needed. Apply patch to right side of neck on for 12 hours and off 12 hours at night.   potassium chloride SA 20 MEQ tablet Commonly known  as: KLOR-CON Take 30 mEq by mouth daily.   predniSONE 1 MG tablet Commonly known as: DELTASONE Take 4 mg by mouth daily with breakfast.   Rewetting Drops Soln 1 drop by Does not apply route every 6 (six) hours as needed.   Rivaroxaban 15 MG Tabs tablet Commonly known as: XARELTO Take 15 mg by mouth daily with supper.   vitamin C 500 MG tablet Commonly known as: ASCORBIC ACID Take 500 mg by mouth daily.       Review of Systems  Constitutional: Positive for activity change, appetite change and fatigue. Negative for fever.  HENT: Positive for hearing loss. Negative for congestion and voice change.   Eyes: Negative for visual disturbance.  Respiratory: Positive for shortness of breath. Negative for cough and wheezing.        DOE  Cardiovascular: Positive for leg swelling. Negative for chest pain.  Gastrointestinal: Negative for abdominal pain and constipation.  Genitourinary: Negative for difficulty urinating, dysuria and urgency.  Musculoskeletal: Positive for arthralgias, gait problem and myalgias.  Skin: Negative for color change.  Neurological: Negative for speech difficulty, weakness and headaches.       Memory lapses.   Psychiatric/Behavioral:  Negative for behavioral problems and sleep disturbance. The patient is not nervous/anxious.     Immunization History  Administered Date(s) Administered  . Influenza Whole 09/14/2018  . Influenza, High Dose Seasonal PF 09/15/2019  . Influenza-Unspecified 09/13/2015, 09/29/2016, 10/03/2017, 09/23/2020  . Moderna Sars-Covid-2 Vaccination 12/15/2019, 01/12/2020  . Pneumococcal Conjugate-13 10/03/2017  . Pneumococcal-Unspecified 05/14/2015  . Tdap 04/13/2011   Pertinent  Health Maintenance Due  Topic Date Due  . INFLUENZA VACCINE  07/13/2021  . DEXA SCAN  Completed  . PNA vac Low Risk Adult  Completed   Fall Risk  09/01/2018 08/23/2017 08/12/2016  Falls in the past year? No No No   Functional Status Survey:    Vitals:   05/07/21 1324  BP: 120/68  Pulse: 74  Resp: 18  Temp: (!) 97.4 F (36.3 C)  SpO2: 95%  Weight: 149 lb (67.6 kg)  Height: 5' 7" (1.702 m)   Body mass index is 23.34 kg/m. Physical Exam Vitals and nursing note reviewed.  Constitutional:      Comments: lethargic  HENT:     Head: Normocephalic and atraumatic.     Mouth/Throat:     Mouth: Mucous membranes are dry.  Eyes:     Extraocular Movements: Extraocular movements intact.     Conjunctiva/sclera: Conjunctivae normal.     Pupils: Pupils are equal, round, and reactive to light.  Cardiovascular:     Rate and Rhythm: Normal rate. Rhythm irregular.     Heart sounds: No murmur heard.   Pulmonary:     Effort: Pulmonary effort is normal.     Breath sounds: Rales present.     Comments: Posterior right base.  Abdominal:     General: Bowel sounds are normal.     Palpations: Abdomen is soft.     Tenderness: There is no abdominal tenderness.  Musculoskeletal:     Cervical back: Normal range of motion.     Right lower leg: Edema present.     Left lower leg: Edema present.     Comments: trace edema BLE. Limited ROM R+L shoulders.   Skin:    General: Skin is warm and dry.     Comments: Chronic venous  insufficiency skin changes BLE. Lateral left lower leg skin tear area developed into a stasis ulcer is scabbed over now.  Neurological:     General: No focal deficit present.     Mental Status: She is alert.     Gait: Gait abnormal.     Comments: Oriented to person, place.   Psychiatric:        Mood and Affect: Mood normal.        Behavior: Behavior normal.     Labs reviewed: Recent Labs    06/17/20 0000 09/10/20 0000 02/05/21 0000 04/07/21 0000 05/07/21 1454  NA  --    < > 139 135* 134*  K  --    < > 4.4 3.5 4.0  CL  --    < > 105 95* 96*  CO2  --    < > 27* 30* 29  GLUCOSE  --   --   --   --  88  BUN  --    < > 30* 45* 36*  CREATININE  --    < > 0.9 1.2* 0.82  CALCIUM  --    < > 9.2 10.9* 9.6  MG 2.1  --   --   --   --    < > = values in this interval not displayed.   Recent Labs    02/05/21 0000 04/07/21 0000 05/07/21 1454  AST _0 ALT _1 ALKPHOS 46 54 52  BILITOT  --   --  0.5  PROT  --   --  6.4*  ALBUMIN 3.6 3.8 3.5   Recent Labs    02/05/21 0000 02/12/21 0000 04/07/21 0000 05/07/21 1454  WBC 6.1 6.8 5.3 5.8  NEUTROABS 3,532.00  --  2,602.00 3.7  HGB 10.9* 12.5 11.0* 10.0*  HCT 33* 38 33* 31.5*  MCV  --   --   --  93.2  PLT 133* 166 154 158   Lab Results  Component Value Date   TSH 1.82 03/07/2018   Lab Results  Component Value Date   HGBA1C 5.4 05/17/2017   Lab Results  Component Value Date   CHOL 148 02/14/2020   HDL 57 02/14/2020   LDLCALC 71 02/14/2020   TRIG 121 02/14/2020   CHOLHDL 2.4 05/04/2016    Significant Diagnostic Results in last 30 days:  DG Lumbar Spine Complete  Result Date: 05/07/2021 CLINICAL DATA:  Recent fall with low back pain, initial encounter EXAM: LUMBAR SPINE - COMPLETE 4+ VIEW COMPARISON:  None. FINDINGS: Five lumbar type vertebral bodies are well visualized. Scoliosis concave to the right at the thoracolumbar junction is noted. No pars defects are seen. Multilevel disc space narrowing is  noted. No compression deformities are seen. Soft tissue structures are within normal limits. IMPRESSION: Degenerative change and scoliosis as described without acute abnormality. Electronically Signed   By: Inez Catalina M.D.   On: 05/07/2021 13:52   DG Shoulder Right  Result Date: 05/07/2021 CLINICAL DATA:  Recent fall with right shoulder pain, initial encounter EXAM: RIGHT SHOULDER - 2+ VIEW COMPARISON:  04/11/2008 FINDINGS: Right shoulder replacement is now seen. Degenerative changes of the glenoid are noted. The humeral head replacement is somewhat high-riding. Degenerative changes of the acromioclavicular joint are seen. No other focal abnormality is noted. IMPRESSION: Degenerative and postop change without acute abnormality. Electronically Signed   By: Inez Catalina M.D.   On: 05/07/2021 13:56   CT Head Wo Contrast  Result Date: 05/07/2021 CLINICAL DATA:  Diffuse pain.  Patient found down today. EXAM: CT HEAD WITHOUT CONTRAST CT CERVICAL SPINE WITHOUT CONTRAST TECHNIQUE:  Multidetector CT imaging of the head and cervical spine was performed following the standard protocol without intravenous contrast. Multiplanar CT image reconstructions of the cervical spine were also generated. COMPARISON:  Brain MRI 08/12/2016. FINDINGS: CT HEAD FINDINGS Brain: No evidence of acute infarction, hemorrhage, hydrocephalus, extra-axial collection or mass lesion/mass effect. Vascular: No hyperdense vessel or unexpected calcification. Skull: Intact.  No focal lesion. Sinuses/Orbits: Negative. Other: None. CT CERVICAL SPINE FINDINGS Alignment: Alignment. Skull base and vertebrae: The patient is status post C3-5 fusion. There is bony destructive change in the inferior aspect of C3, throughout C4 and in the majority of C5. Destructive change involves the posterior elements on the right at each of these levels and the posterior elements on the left at C4. Soft tissue windows demonstrate a mass lesion extending out of the  vertebral bodies into the central canal which appears to cause severe central canal stenosis. Soft tissues and spinal canal: As above. Disc levels: Marked loss of disc space height is present at C6-7. Scattered facet degenerative change noted. Upper chest: Lung apices demonstrate emphysematous change. Other: None. IMPRESSION: Destructive process from C4 to C5 is centered in C4. A mass lesion extends into the central canal at these levels and appears to cause severe stenosis, worst at C4. Recommend cervical spine MRI with and without contrast further evaluation. Negative for trauma head or cervical spine. Emphysema (ICD10-J43.9). Electronically Signed   By: Thomas  Dalessio M.D.   On: 05/07/2021 14:17   CT CERVICAL SPINE WO CONTRAST  Result Date: 05/07/2021 CLINICAL DATA:  Diffuse pain.  Patient found down today. EXAM: CT HEAD WITHOUT CONTRAST CT CERVICAL SPINE WITHOUT CONTRAST TECHNIQUE: Multidetector CT imaging of the head and cervical spine was performed following the standard protocol without intravenous contrast. Multiplanar CT image reconstructions of the cervical spine were also generated. COMPARISON:  Brain MRI 08/12/2016. FINDINGS: CT HEAD FINDINGS Brain: No evidence of acute infarction, hemorrhage, hydrocephalus, extra-axial collection or mass lesion/mass effect. Vascular: No hyperdense vessel or unexpected calcification. Skull: Intact.  No focal lesion. Sinuses/Orbits: Negative. Other: None. CT CERVICAL SPINE FINDINGS Alignment: Alignment. Skull base and vertebrae: The patient is status post C3-5 fusion. There is bony destructive change in the inferior aspect of C3, throughout C4 and in the majority of C5. Destructive change involves the posterior elements on the right at each of these levels and the posterior elements on the left at C4. Soft tissue windows demonstrate a mass lesion extending out of the vertebral bodies into the central canal which appears to cause severe central canal stenosis. Soft  tissues and spinal canal: As above. Disc levels: Marked loss of disc space height is present at C6-7. Scattered facet degenerative change noted. Upper chest: Lung apices demonstrate emphysematous change. Other: None. IMPRESSION: Destructive process from C4 to C5 is centered in C4. A mass lesion extends into the central canal at these levels and appears to cause severe stenosis, worst at C4. Recommend cervical spine MRI with and without contrast further evaluation. Negative for trauma head or cervical spine. Emphysema (ICD10-J43.9). Electronically Signed   By: Thomas  Dalessio M.D.   On: 05/07/2021 14:17   DG Chest Port 1 View  Result Date: 05/07/2021 CLINICAL DATA:  Shortness of breath. EXAM: PORTABLE CHEST 1 VIEW COMPARISON:  May 10, 2016. FINDINGS: Linear opacities at the left base, similar over multiple priors and likely scarring. No new consolidation. No visible pleural effusions or pneumothorax on this limited semi erect single radiograph. Enlarged cardiac silhouette, likely accentuated by AP technique. Partially   imaged ACDF. Partially imaged bilateral shoulder arthroplasties. IMPRESSION: 1. No evidence of acute cardiopulmonary disease. 2. Linear opacities at the left base, similar over multiple priors and likely scarring. Electronically Signed   By: Frederick S Jones MD   On: 05/07/2021 14:59   DG Shoulder Left  Result Date: 05/07/2021 CLINICAL DATA:  Recent fall with left shoulder pain, initial encounter EXAM: LEFT SHOULDER - 2+ VIEW COMPARISON:  12/29/2017 FINDINGS: Left shoulder replacement is seen. The overall appearance is stable from the prior exam. Mild degenerative changes of the acromioclavicular joint are seen. Visualize ribcage is within normal limits. Changes of prior fusion in the cervical spine are noted. IMPRESSION: Postsurgical change without acute abnormality. Electronically Signed   By: Mark  Lukens M.D.   On: 05/07/2021 13:54   DG Foot Complete Right  Result Date:  05/07/2021 CLINICAL DATA:  Recent fall with foot pain, initial encounter EXAM: RIGHT FOOT COMPLETE - 3+ VIEW COMPARISON:  None. FINDINGS: Hallux valgus deformity is noted. No acute fracture or dislocation is noted. No soft tissue abnormality is noted. Vascular calcifications are seen. IMPRESSION: Degenerative change without acute abnormality. Electronically Signed   By: Mark  Lukens M.D.   On: 05/07/2021 13:48   DG Hip Unilat With Pelvis 2-3 Views Left  Result Date: 05/07/2021 CLINICAL DATA:  Recent fall with left hip pain, initial encounter EXAM: DG HIP (WITH OR WITHOUT PELVIS) 3V LEFT COMPARISON:  None. FINDINGS: Pelvic ring is intact. Degenerative changes of lumbar spine are again noted. No acute fracture or dislocation is noted. Mild degenerative changes of the hip joints are seen. IMPRESSION: Degenerative change without acute abnormality. Electronically Signed   By: Mark  Lukens M.D.   On: 05/07/2021 13:53    Assessment/Plan Generalized weakness  lethargy, pain/aches allover, stayed in bed today. Her usual state of health is up in w/c daily. The patient is afebrile, skin and mouth appeared dry. Denied chest pain, SOB, nausea, vomiting, or dysuria. She is afebrile, no O2 desaturation.   HPOA the patient's daughter present, desires ED for further eval/tx.   Osteoarthritis Chronic neck pain/cervicalgia,08/14/16 X-ray C spine: anterior interbody fusion cervical spine, severe degenerative disc disease C7-T1, has shoulder prosthesis.  2019 right side neck pain, inj 02/05/19 Ortho 12/25/20 the patient declined Gabapentin 100mg qd.  Failed trial of Prednisone.   Chronic venous stasis dermatitis of left lower extremity    Chronic diastolic CHF (congestive heart failure), NYHA class 3 (HCC) Chronic venous insufficiency BLE  CHF/edema BLE, on Furosemide60mg qd.EF 65% 2017. Creat 1.13 eGFR 43 03/25/21.  Metolazone 2.5mg MWF 03/16/21  Atrial fibrillation (HCC) heart rate is in control, on  Diltiazem 120mg qd, Metoprolol 150mg qd, Xarelto 15mg qd.Cardiologyf/u   Essential hypertension heart rate is in control, on Diltiazem 120mg qd, Metoprolol 150mg qd, Xarelto 15mg qd.Cardiologyf/u   Rheumatoid arthritis (HCC) positive FAF,pain, multiples sites, on Prednisone 4mg qd, Tylenol 1000mg bid, 500mg qd.f/u Rheumatology      Family/ staff Communication: plan of care reviewed with the patient and charge nurse.   Labs/tests ordered:  none  Time spend 40 minutes.   

## 2021-05-08 DIAGNOSIS — W19XXXA Unspecified fall, initial encounter: Secondary | ICD-10-CM | POA: Diagnosis not present

## 2021-05-08 DIAGNOSIS — R410 Disorientation, unspecified: Secondary | ICD-10-CM | POA: Diagnosis not present

## 2021-05-08 DIAGNOSIS — I959 Hypotension, unspecified: Secondary | ICD-10-CM | POA: Diagnosis not present

## 2021-05-08 DIAGNOSIS — Z515 Encounter for palliative care: Secondary | ICD-10-CM

## 2021-05-08 DIAGNOSIS — R4182 Altered mental status, unspecified: Secondary | ICD-10-CM | POA: Diagnosis not present

## 2021-05-08 DIAGNOSIS — R55 Syncope and collapse: Secondary | ICD-10-CM | POA: Diagnosis not present

## 2021-05-08 MED ORDER — HYDROMORPHONE HCL 1 MG/ML IJ SOLN
0.5000 mg | Freq: Once | INTRAMUSCULAR | Status: AC
  Administered 2021-05-08: 0.5 mg via INTRAVENOUS
  Filled 2021-05-08: qty 1

## 2021-05-08 MED ORDER — FENTANYL CITRATE (PF) 100 MCG/2ML IJ SOLN
25.0000 ug | Freq: Once | INTRAMUSCULAR | Status: AC
Start: 2021-05-08 — End: 2021-05-08
  Administered 2021-05-08: 25 ug via INTRAVENOUS
  Filled 2021-05-08: qty 2

## 2021-05-08 MED ORDER — LORAZEPAM 2 MG/ML IJ SOLN
0.5000 mg | Freq: Once | INTRAMUSCULAR | Status: AC
  Administered 2021-05-08: 0.5 mg via INTRAVENOUS
  Filled 2021-05-08: qty 1

## 2021-05-08 MED ORDER — HYDROMORPHONE HCL 4 MG PO TABS: 4.0000 mg | ORAL_TABLET | Freq: Four times a day (QID) | ORAL | 0 refills | Status: DC | PRN

## 2021-05-08 MED ORDER — HYDROMORPHONE HCL 1 MG/ML IJ SOLN
0.5000 mg | INTRAMUSCULAR | Status: DC | PRN
  Administered 2021-05-08: 0.5 mg via INTRAVENOUS
  Filled 2021-05-08: qty 1

## 2021-05-08 NOTE — ED Notes (Signed)
Spoke with [facility]. Pt is next in line to be transported

## 2021-05-08 NOTE — Progress Notes (Signed)
TOC CSW attempted to contact [place] [phone].  CSW left HIPPA compliant message with my contact information.  Ramone Gander Tarpley-Carter, MSW, LCSW-A Pronouns:  She, Her, Hers                  [facility] Transitions of CareClinical Social Worker [email or web address] [phone]

## 2021-05-08 NOTE — NC FL2 (Signed)
[place] LEVEL OF CARE SCREENING TOOL     IDENTIFICATION  Patient Name: [name] Birthdate: [birth year] Sex: female Admission Date (Current Location): 05/07/2021  [facility] and [place] Number:  Herbalist and Address:  [facility],  [place] [address] [place]      Provider Number: [id number]  Attending Physician Name and Address:  Default, Provider, MD  Relative Name and Phone Number:  Jan Stoneman/Daughter   [phone]    Current Level of Care: Hospital Recommended Level of Care: [place] Prior Approval Number:    Date Approved/Denied:   PASRR Number: [id number]  Discharge Plan: SNF    Current Diagnoses: Patient Active Problem List   Diagnosis Date Noted  . Generalized weakness 04/06/2021  . Venous stasis ulcer of left lower leg with edema of left lower leg ([place]) 03/16/2021  . Chronic venous stasis dermatitis of left lower extremity 12/25/2020  . CKD (chronic kidney disease) stage 3, GFR 30-59 ml/min (HCC) 09/11/2020  . Cellulitis of leg without foot, left 08/22/2019  . Incontinent of urine 04/02/2019  . Allergic rhinitis 12/04/2018  . Unsteady gait 03/02/2018  . Constipation 03/03/2017  . Depression with anxiety 01/26/2017  . Hyperlipidemia 12/27/2016  . Edema leg 09/22/2016  . Keratoacanthoma 08/12/2016  . Osteoarthritis 08/12/2016  . Cervicalgia 08/12/2016  . Chronic anticoagulation 07/29/2016  . Chronic diastolic CHF (congestive heart failure), NYHA class 3 ([place]) 06/17/2016  . Atrial fibrillation ([place]) 05/03/2016  . Abnormal chest CT 05/03/2016  . Syncope 11/03/2014  . Essential hypertension 11/03/2014  . Rheumatoid arthritis ([place]) 11/03/2014    Orientation RESPIRATION BLADDER Height & Weight     Self    Incontinent Weight:   Height:     BEHAVIORAL SYMPTOMS/MOOD NEUROLOGICAL BOWEL NUTRITION STATUS      Incontinent Diet (Regular)  AMBULATORY STATUS COMMUNICATION OF NEEDS Skin    Extensive Assist Verbally Normal                       Personal Care Assistance Level of Assistance  Bathing,Feeding,Dressing Bathing Assistance: Maximum assistance Feeding assistance: Limited assistance Dressing Assistance: Maximum assistance     Functional Limitations Info  Sight,Hearing,Speech Sight Info: Adequate Hearing Info: Adequate Speech Info: Adequate    SPECIAL CARE FACTORS FREQUENCY  PT (By licensed PT),OT (By licensed OT)     PT Frequency: 5x a week OT Frequency: 5x a week            Contractures Contractures Info: Not present    Additional Factors Info  Code Status,Allergies Code Status Info: DNR Allergies Info: Morphine Sulfate           Current Medications (05/08/2021):  This is the current hospital active medication list Current Facility-Administered Medications  Medication Dose Route Frequency Provider Last Rate Last Admin  . acetaminophen (TYLENOL) tablet 500-1,000 mg  500-1,000 mg Oral UD Suella Broad A, PA-C   500 mg at 05/07/21 1740  . ascorbic acid (VITAMIN C) tablet 500 mg  500 mg Oral Daily Suella Broad A, PA-C   500 mg at 05/07/21 1740  . calcium carbonate (OS-CAL - dosed in mg of elemental calcium) tablet 1,250 mg  1,250 mg Oral QAC breakfast Suella Broad A, PA-C      . diltiazem (CARDIZEM CD) 24 hr capsule 120 mg  120 mg Oral Daily Suella Broad A, PA-C   120 mg at 05/07/21 1922  . enalapril (VASOTEC) tablet 20 mg  20 mg Oral  BID Suella Broad A, PA-C   20 mg at 05/07/21 2220  . furosemide (LASIX) tablet 60 mg  60 mg Oral Daily Suella Broad A, PA-C   60 mg at 05/07/21 1741  . lidocaine (LIDODERM) 5 % 1 patch  1 patch Transdermal Q12H PRN Tacy Learn, PA-C      . loratadine (CLARITIN) tablet 10 mg  10 mg Oral Daily Suella Broad A, PA-C   10 mg at 05/07/21 1740  . magnesium oxide (MAG-OX) tablet 200 mg  200 mg Oral Daily Tacy Learn, PA-C      . metolazone (ZAROXOLYN) tablet 2.5 mg  2.5 mg Oral Once per day on Mon Fri  Tacy Learn, PA-C      . metoprolol succinate (TOPROL-XL) 24 hr tablet 150 mg  150 mg Oral QHS Suella Broad A, PA-C   150 mg at 05/07/21 2220  . polyvinyl alcohol (LIQUIFILM TEARS) 1.4 % ophthalmic solution 1 drop  1 drop Both Eyes PRN Suella Broad A, PA-C      . potassium chloride (KLOR-CON) CR tablet 30 mEq  30 mEq Oral Daily Suella Broad A, PA-C   30 mEq at 05/07/21 1740  . predniSONE (DELTASONE) tablet 4 mg  4 mg Oral Q breakfast Tacy Learn, PA-C   4 mg at 05/07/21 1922  . psyllium (HYDROCIL/METAMUCIL) 2 packet  2 packet Oral Daily Tacy Learn, PA-C   2 packet at 05/07/21 1922  . Rivaroxaban (XARELTO) tablet 15 mg  15 mg Oral Q supper Tacy Learn, PA-C   15 mg at 05/07/21 3244   Current Outpatient Medications  Medication Sig Dispense Refill  . acetaminophen (TYLENOL) 500 MG tablet Take 500-1,000 mg by mouth as directed. Take 2 tablets (1000 mg) BID and Take 1 tablet (500 mg) at bedtime    . amoxicillin (AMOXIL) 500 MG tablet Take 2,000 mg by mouth as needed. Take 1 hour prior to dental appointment.    . calcium carbonate (OSCAL) 1500 (600 Ca) MG TABS tablet Take 1,500 mg by mouth daily. OTC    . dextromethorphan-guaiFENesin (MUCINEX DM) 30-600 MG 12hr tablet Take 1 tablet by mouth 2 (two) times daily as needed for cough.    . diltiazem (CARDIZEM CD) 120 MG 24 hr capsule Take 1 capsule (120 mg total) by mouth daily. 90 capsule 3  . enalapril (VASOTEC) 20 MG tablet Take 20 mg by mouth 2 (two) times daily.    . furosemide (LASIX) 20 MG tablet Take 60 mg by mouth daily.    . Lidocaine (PAIN RELIEF MAXIMUM STRENGTH) 4 % PTCH Apply 1 patch topically every 12 (twelve) hours as needed (pain). Apply patch to right side of neck on for 12 hours and off 12 hours at night.    . loratadine (CLARITIN) 10 MG tablet Take 10 mg by mouth daily.    . Magnesium Oxide 250 MG TABS Take 250 mg by mouth daily.    . Menthol 10 % AERO Apply topically in the morning, at noon, in the evening, and at  bedtime. Apply cream to right side of neck.    . metolazone (ZAROXOLYN) 2.5 MG tablet Take 2.5 mg by mouth as directed. Once a day on MON, FRI    . metoprolol succinate (TOPROL-XL) 100 MG 24 hr tablet Take 100 mg by mouth at bedtime. Take along with 50 mg tablet=150 mg    . metoprolol succinate (TOPROL-XL) 50 MG 24 hr tablet Take 50 mg by mouth at bedtime.  Take along with 100 mg tablet=150 mg    . OXYGEN Inhale 2 L into the lungs daily as needed (shortness of breath). Use 2 liter as needed for SOB, 02 sat < 90% on room air    . potassium chloride SA (K-DUR,KLOR-CON) 20 MEQ tablet Take 30 mEq by mouth daily.     . predniSONE (DELTASONE) 1 MG tablet Take 4 mg by mouth daily with breakfast.    . Rivaroxaban (XARELTO) 15 MG TABS tablet Take 15 mg by mouth daily with supper.    . Soft Lens Products (REWETTING DROPS) SOLN 1 drop by Does not apply route every 6 (six) hours as needed.    . vitamin C (ASCORBIC ACID) 500 MG tablet Take 500 mg by mouth daily.    . Wheat Dextrin (BENEFIBER PO) Take 4 scoop by mouth daily.        Discharge Medications: Please see discharge summary for a list of discharge medications.  Relevant Imaging Results:  Relevant Lab Results:   Additional Information SSN#:  [id number]  Ht:  5'7"     Wt:  149lbs     BMI:  23.34kg  Mekhi Lascola C Tarpley-Carter, LCSWA

## 2021-05-08 NOTE — Progress Notes (Signed)
TOC CSW attempted to contact [facility] [phone] and was transferred to RN.  CSW left HIPPA compliant message with my contact information.  Demyah Smyre Tarpley-Carter, MSW, LCSW-A Pronouns:  She, Her, Hers                  [place] ED Transitions of CareClinical Social Worker [email or web address] [phone]

## 2021-05-08 NOTE — ED Notes (Signed)
Reviewed d/c instructions with pt's daughter, Jan. Daughter expressed concern about pain control over the weekend. New AVS printed with pain prescription included. [facility] staff verbalized that they will inform daughter of updated d/c instructions upon arrival at [facility]

## 2021-05-08 NOTE — Discharge Instructions (Addendum)
Follow-up with your hospice care nurses at your facility.  Return to the ER if your pain is not well controlled, if you have fevers worsening symptoms or any additional concerns.

## 2021-05-08 NOTE — ED Notes (Signed)
Pt refused PO meds at this time due to pain, states she wants to hold off until she is able to sit up more comfortably.

## 2021-05-08 NOTE — Progress Notes (Signed)
PT Cancellation Note  Patient Details Name: LIDWINA KANER MRN: [id number] DOB: 28-Dec-1927   Cancelled Treatment:    Reason Eval/Treat Not Completed: PT screened, no needs identified, will sign off, patient's daughter declines PT. Daughter states that patient is to Dc to [place] with Hospice.   Claretha Cooper 05/08/2021, 9:22 AM [place] Pager [phone] Office [phone]

## 2021-05-08 NOTE — ED Provider Notes (Signed)
Patient was signed out to me is pending final disposition.  Continues to complain of generalized pain given medications here in the ER.  Upon my evaluation of her ancillary studies, I noted that the CT scan shows a large cervical mass with significant stenosis reported.  This may be the cause of much of her pain.  I discussed this with the family regarding this finding, they do not want to pursue any surgical intervention for this patient they want a pursue hospice care comfort measures for her.  Patient will be discharged back to her facility where they will initiate hospice care at the nursing home.   Luna Fuse, MD 05/08/21 [phone]

## 2021-05-08 NOTE — Progress Notes (Signed)
TOC CSW attempted to contact [facility] for a contact a [facility] [phone].    CSW left HIPPA compliant message with my contact information.Leverne Amrhein Tarpley-Carter, MSW, LCSW-A Pronouns:  She, Her, Hers                  [place] ED Transitions of CareClinical Social Worker [email or web address] [phone]

## 2021-05-08 NOTE — Progress Notes (Signed)
TOC CSW attempted to contact [facility] [phone].  They provided CSW with the main number to [place] [phone].  Aloysuis Ribaudo Tarpley-Carter, MSW, LCSW-A Pronouns:  She, Her, Hers                  [place] ED Transitions of CareClinical Social Worker [email or web address] [phone]

## 2021-05-08 NOTE — ED Notes (Signed)
Dr. Wyvonnia Dusky notified about patient requesting pain medication.

## 2021-05-08 NOTE — ED Notes (Signed)
Pt requesting pain medication, pain extreme throughout body. MD Weldon made aware.

## 2021-05-08 NOTE — ED Notes (Signed)
Attempted to contact [facility] to give report to a nurse with no answer

## 2021-05-08 NOTE — ED Notes (Signed)
Patients linens and brief changed.

## 2021-05-12 ENCOUNTER — Non-Acute Institutional Stay (SKILLED_NURSING_FACILITY): Payer: Medicare Other | Admitting: Nurse Practitioner

## 2021-05-12 ENCOUNTER — Encounter: Payer: Self-pay | Admitting: Nurse Practitioner

## 2021-05-12 DIAGNOSIS — N888 Other specified noninflammatory disorders of cervix uteri: Secondary | ICD-10-CM | POA: Diagnosis not present

## 2021-05-12 DIAGNOSIS — I10 Essential (primary) hypertension: Secondary | ICD-10-CM | POA: Diagnosis not present

## 2021-05-12 DIAGNOSIS — M0579 Rheumatoid arthritis with rheumatoid factor of multiple sites without organ or systems involvement: Secondary | ICD-10-CM | POA: Diagnosis not present

## 2021-05-12 DIAGNOSIS — I4821 Permanent atrial fibrillation: Secondary | ICD-10-CM

## 2021-05-12 LAB — CULTURE, BLOOD (SINGLE)
Culture: NO GROWTH
Special Requests: ADEQUATE

## 2021-05-12 NOTE — Assessment & Plan Note (Signed)
CT head/cervical spine:Destructive process from C4 to C5 is centered in C4. A mass lesion extends into the central canal at these levels and appears to cause severe stenosis,  opted out further workup, enrolled in Hospice service, place on Morphine for pain control.

## 2021-05-12 NOTE — Assessment & Plan Note (Signed)
off Xarelto

## 2021-05-12 NOTE — Progress Notes (Signed)
Location:   S[facility] Room Number: 61 Place of Service:  SNF (31) Provider: Lennie Odor Maribella Kuna NP  Virgie Dad, MD  Patient Care Team: Virgie Dad, MD as PCP - General (Internal Medicine) Martinique, Peter M, MD as PCP - Cardiology (Cardiology) Clemence Stillings X, NP as Nurse Practitioner (Internal Medicine) [place], Peter M, MD as Consulting Physician (Cardiology) Lavonna Monarch, MD as Consulting Physician (Dermatology) Garald Balding, MD as Consulting Physician (Orthopedic Surgery)  Extended Emergency Contact Information Primary Emergency Contact: Berneta Levins Address: [address] [place], [place] [address] Johnnette Litter of [place] Phone: [phone] Mobile Phone: [phone] Relation: Daughter Secondary Emergency Contact: Azalee Course [place] of [place] Phone: [phone] Mobile Phone: [phone] Relation: Son  Code Status: DNR Goals of care: Advanced Directive information Advanced Directives 05/07/2021  Does Patient Have a Medical Advance Directive? Yes  Type of Paramedic of [place];Living will;Out of facility DNR (pink MOST or yellow form)  Does patient want to make changes to medical advance directive? No - Patient declined  Copy of [place] in Chart? Yes - validated most recent copy scanned in chart (See row information)  Would patient like information on creating a medical advance directive? -  Pre-existing out of facility DNR order (yellow form or pink MOST form) Yellow form placed in chart (order not valid for inpatient use)     Chief Complaint  Patient presents with  . Acute Visit    Medication review    HPI:  Pt is a 87 y.o. female seen today for an acute visit for medication review following ED eval 05/08/21   CT head/cervical spine:Destructive process from C4 to C5 is centered in C4. A mass lesion extends into the central canal at these levels and appears to cause severe  stenosis,  opted out further workup, enrolled in Hospice service, place on Morphine for pain control.   RA positive RAF, off Prednisone   Afib, off Xarelto  HTN, off Enalapril, Metoprolol, Diltiazem.              Past Medical History:  Diagnosis Date  . Cervicalgia 08/12/2016  . CHF (congestive heart failure) ([place])   . Chronic diastolic CHF (congestive heart failure), NYHA class 3 ([place]) 06/17/2016  . Complication of anesthesia   . Edema 08/12/2016  . Hypertension   . New onset atrial fibrillation ([place]) 04/2016   a. started on Xarelto and Cardizem  . PONV (postoperative nausea and vomiting)   . RA (rheumatoid arthritis) ([place])   . SCC (squamous cell carcinoma) KA 09/15/2016   Left forearm - tx p bx  . SOB (shortness of breath) 04/2016   Hospitalist  . Vertigo    Past Surgical History:  Procedure Laterality Date  . Biopsy right breast Right    fibrocystic changes  . CARDIOVERSION N/A 07/08/2016   Procedure: CARDIOVERSION;  Surgeon: Larey Dresser, MD;  Location: [facility];  Service: Cardiovascular;  Laterality: N/A;  . CERVICAL SPINE SURGERY  2005   Dr. Vertell Limber  . HYSTEROSCOPY  2001  . JOINT REPLACEMENT    . REPLACEMENT TOTAL KNEE Right 1996   [place] [facility] Bilateral [phone]   Baird Lyons MD    Allergies  Allergen Reactions  . Morphine Sulfate Other (See Comments)    "extremely nauseated"  . Other Nausea And Vomiting    Pt states she is very sensitive to  pain medications.    Allergies as of 05/12/2021      Reactions   Morphine Sulfate Other (See Comments)   "extremely nauseated"   Other Nausea And Vomiting   Pt states she is very sensitive to pain medications.      Medication List       Accurate as of May 12, 2021  3:33 PM. If you have any questions, ask your nurse or doctor.        STOP taking these medications   acetaminophen 500 MG tablet Commonly known as: TYLENOL Stopped by: Vergia Chea X Coti Burd, NP   amoxicillin 500 MG  tablet Commonly known as: AMOXIL Stopped by: Donyale Falcon X Taiden Raybourn, NP   BENEFIBER PO Stopped by: Khamarion Bjelland X Ia Leeb, NP   calcium carbonate 1500 (600 Ca) MG Tabs tablet Commonly known as: OSCAL Stopped by: Carlos Heber X Fabricio Endsley, NP   dextromethorphan-guaiFENesin 30-600 MG 12hr tablet Commonly known as: MUCINEX DM Stopped by: Eluterio Seymour X Joniece Smotherman, NP   diltiazem 120 MG 24 hr capsule Commonly known as: CARDIZEM CD Stopped by: Rainie Crenshaw X Zanaria Morell, NP   enalapril 20 MG tablet Commonly known as: VASOTEC Stopped by: Kenyatte Gruber X Manessa Buley, NP   furosemide 20 MG tablet Commonly known as: LASIX Stopped by: Najae Filsaime X Cyler Kappes, NP   HYDROmorphone 4 MG tablet Commonly known as: Dilaudid Stopped by: Emilene Roma X Farren Landa, NP   loratadine 10 MG tablet Commonly known as: CLARITIN Stopped by: Zuhair Lariccia X Asir Bingley, NP   Magnesium Oxide 250 MG Tabs Stopped by: Lenard Kampf X Krystol Rocco, NP   Menthol 10 % Aero Stopped by: Garnet Overfield X Wang [place], NP   metolazone 2.5 MG tablet Commonly known as: ZAROXOLYN Stopped by: Darus Hershman X Keyuna Cuthrell, NP   metoprolol succinate 100 MG 24 hr tablet Commonly known as: TOPROL-XL Stopped by: Vitalia Stough X Shelden Raborn, NP   metoprolol succinate 50 MG 24 hr tablet Commonly known as: TOPROL-XL Stopped by: Bertine Schlottman X Byrl Latin, NP   OXYGEN Stopped by: Dajuana Palen X Annalise Mcdiarmid, NP   Pain Relief Maximum Strength 4 % Ptch Generic drug: Lidocaine Stopped by: Rayansh Herbst X Orpah Hausner, NP   potassium chloride SA 20 MEQ tablet Commonly known as: KLOR-CON Stopped by: Aariana Shankland X Raye Wiens, NP   predniSONE 1 MG tablet Commonly known as: DELTASONE Stopped by: Quinetta Shilling X Belia Febo, NP   Rewetting Drops Soln Stopped by: Avin Upperman X Cristy Colmenares, NP   Rivaroxaban 15 MG Tabs tablet Commonly known as: XARELTO Stopped by: Ameliana Brashear X Avonlea Sima, NP   vitamin C 500 MG tablet Commonly known as: ASCORBIC ACID Stopped by: Tienna Bienkowski X Reice Bienvenue, NP     TAKE these medications   LORazepam 0.5 MG tablet Commonly known as: ATIVAN Take 0.5 mg by mouth every 4 (four) hours as needed for anxiety.   morphine 20 MG/ML concentrated solution Commonly known as: ROXANOL Take 20 mg by  mouth every 2 (two) hours as needed for severe pain.   morphine 20 MG/ML concentrated solution Commonly known as: ROXANOL Take 20 mg by mouth every 4 (four) hours as needed for severe pain.   ondansetron 4 MG disintegrating tablet Commonly known as: ZOFRAN-ODT Take 4 mg by mouth every 4 (four) hours as needed for nausea or vomiting.       Review of Systems  Constitutional: Positive for activity change, appetite change and fatigue. Negative for fever.  HENT: Positive for hearing loss. Negative for congestion and voice change.   Eyes: Negative for visual disturbance.  Respiratory: Positive for shortness of breath. Negative for cough.   Cardiovascular: Negative for chest  pain and leg swelling.  Gastrointestinal: Negative for abdominal pain and constipation.  Genitourinary: Negative for dysuria and urgency.  Musculoskeletal: Positive for arthralgias, gait problem, myalgias and neck pain.  Skin: Negative for color change.  Neurological: Negative for speech difficulty, weakness and headaches.       Memory lapses.   Psychiatric/Behavioral: Negative for behavioral problems.    Immunization History  Administered Date(s) Administered  . Influenza Whole 09/14/2018  . Influenza, High Dose Seasonal PF 09/15/2019  . Influenza-Unspecified 09/13/2015, 09/29/2016, 10/03/2017, 09/23/2020  . Moderna Sars-Covid-2 Vaccination 12/15/2019, 01/12/2020  . Pneumococcal Conjugate-13 10/03/2017  . Pneumococcal-Unspecified 05/14/2015  . Tdap 04/13/2011   Pertinent  Health Maintenance Due  Topic Date Due  . INFLUENZA VACCINE  07/13/2021  . DEXA SCAN  Completed  . PNA vac Low Risk Adult  Completed   Fall Risk  09/01/2018 08/23/2017 08/12/2016  Falls in the past year? No No No   Functional Status Survey:    Vitals:   05/12/21 1514  BP: 112/72  Pulse: 78  Resp: 16  Temp: (!) 97.5 F (36.4 C)  SpO2: 90%  Weight: 149 lb (67.6 kg)  Height: 5\' 7"  (1.702 m)   Body mass index is 23.34  kg/m. Physical Exam Vitals and nursing note reviewed.  Constitutional:      Comments: lethargic  HENT:     Head: Normocephalic and atraumatic.     Mouth/Throat:     Mouth: Mucous membranes are dry.  Eyes:     Extraocular Movements: Extraocular movements intact.     Conjunctiva/sclera: Conjunctivae normal.     Pupils: Pupils are equal, round, and reactive to light.  Cardiovascular:     Rate and Rhythm: Normal rate. Rhythm irregular.     Heart sounds: No murmur heard.   Pulmonary:     Effort: Pulmonary effort is normal.     Breath sounds: No wheezing or rales.  Abdominal:     General: Bowel sounds are normal.     Palpations: Abdomen is soft.     Tenderness: There is no abdominal tenderness.  Musculoskeletal:     Cervical back: Normal range of motion.     Right lower leg: No edema.     Left lower leg: No edema.     Comments: Limited ROM R+L shoulders. C/o neck pain, pain allover.   Skin:    General: Skin is warm and dry.     Comments: Chronic venous insufficiency skin changes BLE.  Neurological:     General: No focal deficit present.     Mental Status: She is alert.     Gait: Gait abnormal.     Comments: Oriented to person, place.   Psychiatric:     Comments: Emotional, keeps saying I am sorry.      Labs reviewed: Recent Labs    06/17/20 0000 09/10/20 0000 02/05/21 0000 04/07/21 0000 05/07/21 1454  NA  --    < > 139 135* 134*  K  --    < > 4.4 3.5 4.0  CL  --    < > 105 95* 96*  CO2  --    < > 27* 30* 29  GLUCOSE  --   --   --   --  88  BUN  --    < > 30* 45* 36*  CREATININE  --    < > 0.9 1.2* 0.82  CALCIUM  --    < > 9.2 10.9* 9.6  MG 2.1  --   --   --   --    < > =  values in this interval not displayed.   Recent Labs    02/05/21 0000 04/07/21 0000 05/07/21 1454  AST 19 23 25   ALT 10 11 14   ALKPHOS 46 54 52  BILITOT  --   --  0.5  PROT  --   --  6.4*  ALBUMIN 3.6 3.8 3.5   Recent Labs    02/05/21 0000 02/12/21 0000 04/07/21 0000  05/07/21 1454  WBC 6.1 6.8 5.3 5.8  NEUTROABS 3,532.00  --  2,602.00 3.7  HGB 10.9* 12.5 11.0* 10.0*  HCT 33* 38 33* 31.5*  MCV  --   --   --  93.2  PLT 133* 166 154 158   Lab Results  Component Value Date   TSH 1.82 03/07/2018   Lab Results  Component Value Date   HGBA1C 5.4 05/17/2017   Lab Results  Component Value Date   CHOL 148 02/14/2020   HDL 57 02/14/2020   LDLCALC 71 02/14/2020   TRIG 121 02/14/2020   CHOLHDL 2.4 05/04/2016    Significant Diagnostic Results in last 30 days:  DG Lumbar Spine Complete  Result Date: 05/07/2021 CLINICAL DATA:  Recent fall with low back pain, initial encounter EXAM: LUMBAR SPINE - COMPLETE 4+ VIEW COMPARISON:  None. FINDINGS: Five lumbar type vertebral bodies are well visualized. Scoliosis concave to the right at the thoracolumbar junction is noted. No pars defects are seen. Multilevel disc space narrowing is noted. No compression deformities are seen. Soft tissue structures are within normal limits. IMPRESSION: Degenerative change and scoliosis as described without acute abnormality. Electronically Signed   By: Inez Catalina M.D.   On: 05/07/2021 13:52   DG Shoulder Right  Result Date: 05/07/2021 CLINICAL DATA:  Recent fall with right shoulder pain, initial encounter EXAM: RIGHT SHOULDER - 2+ VIEW COMPARISON:  04/11/2008 FINDINGS: Right shoulder replacement is now seen. Degenerative changes of the glenoid are noted. The humeral head replacement is somewhat high-riding. Degenerative changes of the acromioclavicular joint are seen. No other focal abnormality is noted. IMPRESSION: Degenerative and postop change without acute abnormality. Electronically Signed   By: Inez Catalina M.D.   On: 05/07/2021 13:56   CT Head Wo Contrast  Result Date: 05/07/2021 CLINICAL DATA:  Diffuse pain.  Patient found down today. EXAM: CT HEAD WITHOUT CONTRAST CT CERVICAL SPINE WITHOUT CONTRAST TECHNIQUE: Multidetector CT imaging of the head and cervical spine was  performed following the standard protocol without intravenous contrast. Multiplanar CT image reconstructions of the cervical spine were also generated. COMPARISON:  Brain MRI 08/12/2016. FINDINGS: CT HEAD FINDINGS Brain: No evidence of acute infarction, hemorrhage, hydrocephalus, extra-axial collection or mass lesion/mass effect. Vascular: No hyperdense vessel or unexpected calcification. Skull: Intact.  No focal lesion. Sinuses/Orbits: Negative. Other: None. CT CERVICAL SPINE FINDINGS Alignment: Alignment. Skull base and vertebrae: The patient is status post C3-5 fusion. There is bony destructive change in the inferior aspect of C3, throughout C4 and in the majority of C5. Destructive change involves the posterior elements on the right at each of these levels and the posterior elements on the left at C4. Soft tissue windows demonstrate a mass lesion extending out of the vertebral bodies into the central canal which appears to cause severe central canal stenosis. Soft tissues and spinal canal: As above. Disc levels: Marked loss of disc space height is present at C6-7. Scattered facet degenerative change noted. Upper chest: Lung apices demonstrate emphysematous change. Other: None. IMPRESSION: Destructive process from C4 to C5 is centered in C4. A mass  lesion extends into the central canal at these levels and appears to cause severe stenosis, worst at C4. Recommend cervical spine MRI with and without contrast further evaluation. Negative for trauma head or cervical spine. Emphysema (ICD10-J43.9). Electronically Signed   By: Inge Rise M.D.   On: 05/07/2021 14:17   CT CERVICAL SPINE WO CONTRAST  Result Date: 05/07/2021 CLINICAL DATA:  Diffuse pain.  Patient found down today. EXAM: CT HEAD WITHOUT CONTRAST CT CERVICAL SPINE WITHOUT CONTRAST TECHNIQUE: Multidetector CT imaging of the head and cervical spine was performed following the standard protocol without intravenous contrast. Multiplanar CT image  reconstructions of the cervical spine were also generated. COMPARISON:  Brain MRI 08/12/2016. FINDINGS: CT HEAD FINDINGS Brain: No evidence of acute infarction, hemorrhage, hydrocephalus, extra-axial collection or mass lesion/mass effect. Vascular: No hyperdense vessel or unexpected calcification. Skull: Intact.  No focal lesion. Sinuses/Orbits: Negative. Other: None. CT CERVICAL SPINE FINDINGS Alignment: Alignment. Skull base and vertebrae: The patient is status post C3-5 fusion. There is bony destructive change in the inferior aspect of C3, throughout C4 and in the majority of C5. Destructive change involves the posterior elements on the right at each of these levels and the posterior elements on the left at C4. Soft tissue windows demonstrate a mass lesion extending out of the vertebral bodies into the central canal which appears to cause severe central canal stenosis. Soft tissues and spinal canal: As above. Disc levels: Marked loss of disc space height is present at C6-7. Scattered facet degenerative change noted. Upper chest: Lung apices demonstrate emphysematous change. Other: None. IMPRESSION: Destructive process from C4 to C5 is centered in C4. A mass lesion extends into the central canal at these levels and appears to cause severe stenosis, worst at C4. Recommend cervical spine MRI with and without contrast further evaluation. Negative for trauma head or cervical spine. Emphysema (ICD10-J43.9). Electronically Signed   By: Inge Rise M.D.   On: 05/07/2021 14:17   DG Chest Port 1 View  Result Date: 05/07/2021 CLINICAL DATA:  Shortness of breath. EXAM: PORTABLE CHEST 1 VIEW COMPARISON:  May 10, 2016. FINDINGS: Linear opacities at the left base, similar over multiple priors and likely scarring. No new consolidation. No visible pleural effusions or pneumothorax on this limited semi erect single radiograph. Enlarged cardiac silhouette, likely accentuated by AP technique. Partially imaged ACDF.  Partially imaged bilateral shoulder arthroplasties. IMPRESSION: 1. No evidence of acute cardiopulmonary disease. 2. Linear opacities at the left base, similar over multiple priors and likely scarring. Electronically Signed   By: Margaretha Sheffield MD   On: 05/07/2021 14:59   DG Shoulder Left  Result Date: 05/07/2021 CLINICAL DATA:  Recent fall with left shoulder pain, initial encounter EXAM: LEFT SHOULDER - 2+ VIEW COMPARISON:  12/29/2017 FINDINGS: Left shoulder replacement is seen. The overall appearance is stable from the prior exam. Mild degenerative changes of the acromioclavicular joint are seen. Visualize ribcage is within normal limits. Changes of prior fusion in the cervical spine are noted. IMPRESSION: Postsurgical change without acute abnormality. Electronically Signed   By: Inez Catalina M.D.   On: 05/07/2021 13:54   DG Foot Complete Right  Result Date: 05/07/2021 CLINICAL DATA:  Recent fall with foot pain, initial encounter EXAM: RIGHT FOOT COMPLETE - 3+ VIEW COMPARISON:  None. FINDINGS: Hallux valgus deformity is noted. No acute fracture or dislocation is noted. No soft tissue abnormality is noted. Vascular calcifications are seen. IMPRESSION: Degenerative change without acute abnormality. Electronically Signed   By: Inez Catalina  M.D.   On: 05/07/2021 13:48   DG Hip Unilat With Pelvis 2-3 Views Left  Result Date: 05/07/2021 CLINICAL DATA:  Recent fall with left hip pain, initial encounter EXAM: DG HIP (WITH OR WITHOUT PELVIS) 3V LEFT COMPARISON:  None. FINDINGS: Pelvic ring is intact. Degenerative changes of lumbar spine are again noted. No acute fracture or dislocation is noted. Mild degenerative changes of the hip joints are seen. IMPRESSION: Degenerative change without acute abnormality. Electronically Signed   By: Inez Catalina M.D.   On: 05/07/2021 13:53    Assessment/Plan: Cervical mass CT head/cervical spine:Destructive process from C4 to C5 is centered in C4. A mass lesion extends  into the central canal at these levels and appears to cause severe stenosis,  opted out further workup, enrolled in Hospice service, place on Morphine for pain control.    Rheumatoid arthritis (HCC) positive RAF, off Prednisone   Atrial fibrillation (HCC) off Xarelto   Essential hypertension off Enalapril, Metoprolol, Diltiazem.     Family/ staff Communication: plan of care reviewed with the patient, the patient's HPOA daughter, and Hospice nurse.   Labs/tests ordered: none  Time spend 35 minutes.

## 2021-05-12 NOTE — Assessment & Plan Note (Signed)
off Enalapril, Metoprolol, Diltiazem.

## 2021-05-12 NOTE — Assessment & Plan Note (Signed)
positive RAF, off Prednisone

## 2021-05-14 ENCOUNTER — Encounter: Payer: Self-pay | Admitting: Internal Medicine

## 2021-05-14 ENCOUNTER — Non-Acute Institutional Stay (SKILLED_NURSING_FACILITY): Admitting: Internal Medicine

## 2021-05-14 DIAGNOSIS — Z515 Encounter for palliative care: Secondary | ICD-10-CM

## 2021-05-14 DIAGNOSIS — I4821 Permanent atrial fibrillation: Secondary | ICD-10-CM

## 2021-05-14 DIAGNOSIS — N888 Other specified noninflammatory disorders of cervix uteri: Secondary | ICD-10-CM | POA: Diagnosis not present

## 2021-05-14 NOTE — Progress Notes (Signed)
Provider:  Veleta Miners MD Location:   [facility] Room Number: 54 Place of Service:  SNF ([phone])  PCP: Virgie Dad, MD Patient Care Team: Virgie Dad, MD as PCP - General (Internal Medicine) Martinique, Peter M, MD as PCP - Cardiology (Cardiology) Mast, Man X, NP as Nurse Practitioner (Internal Medicine) Martinique, Peter M, MD as Consulting Physician (Cardiology) Lavonna Monarch, MD as Consulting Physician (Dermatology) Garald Balding, MD as Consulting Physician (Orthopedic Surgery)  Extended Emergency Contact Information Primary Emergency Contact: Berneta Levins Address: [address] [place], [place] [address] Johnnette Litter of [place] Phone: [phone] Mobile Phone: [phone] Relation: Daughter Secondary Emergency Contact: Azalee Course [place] of [place] Phone: [phone] Mobile Phone: [phone] Relation: Son  Code Status: DNR Hospice Managed Care Goals of Care: Advanced Directive information Advanced Directives 05/19/2021  Does Patient Have a Medical Advance Directive? Yes  Type of Paramedic of [place];Living will;Out of facility DNR (pink MOST or yellow form)  Does patient want to make changes to medical advance directive? No - Patient declined  Copy of [place] in Chart? Yes - validated most recent copy scanned in chart (See row information)  Would patient like information on creating a medical advance directive? -  Pre-existing out of facility DNR order (yellow form or pink MOST form) Yellow form placed in chart (order not valid for inpatient use)      Chief Complaint  Patient presents with  . New Admit To SNF    Admission to SNF    HPI: Patient is a 87 y.o. female seen today for admission to SNF for Hospice care and End of Life Patient has h/o Chronic Atrial Fibrillation on Xarelto, Hypertension, Diastolic CHF, Rheumatoid arthritis on Chronic  Prednisone  Was send to hospital for Unremitting pain in her body. Especially around her Neck CT scan of her Neck showed mass lesion Extending in her Neck. Family Refused further Work up and now she is under hospice All her meds were stopped and she is on Roxanol and Ativan Patient is Unresponsive Feet are Blue She is in no pain Daughter in Room  Past Medical History:  Diagnosis Date  . Cervicalgia 08/12/2016  . CHF (congestive heart failure) ([facility])   . Chronic diastolic CHF (congestive heart failure), NYHA class 3 ([facility]) 06/17/2016  . Complication of anesthesia   . Edema 08/12/2016  . Hypertension   . New onset atrial fibrillation ([facility]) 04/2016   a. started on Xarelto and Cardizem  . PONV (postoperative nausea and vomiting)   . RA (rheumatoid arthritis) ([facility])   . SCC (squamous cell carcinoma) KA 09/15/2016   Left forearm - tx p bx  . SOB (shortness of breath) 04/2016   Hospitalist  . Vertigo    Past Surgical History:  Procedure Laterality Date  . Biopsy right breast Right    fibrocystic changes  . CARDIOVERSION N/A 07/08/2016   Procedure: CARDIOVERSION;  Surgeon: Larey Dresser, MD;  Location: [facility];  Service: Cardiovascular;  Laterality: N/A;  . CERVICAL SPINE SURGERY  2005   Dr. Vertell Limber  . HYSTEROSCOPY  2001  . JOINT REPLACEMENT    . REPLACEMENT TOTAL KNEE Right 1996   [place] Bilateral 2005,2008,2009   Baird Lyons MD    reports that she quit smoking about 54 years ago. She has never used smokeless tobacco. She reports that she does  not drink alcohol and does not use drugs. Social History   Socioeconomic History  . Marital status: Widowed    Spouse name: Not on file  . Number of children: Not on file  . Years of education: Not on file  . Highest education level: Not on file  Occupational History  . Occupation: Retired  Tobacco Use  . Smoking status: Former Smoker    Quit date: 12/13/1966    Years since quitting: 54.4  .  Smokeless tobacco: Never Used  Vaping Use  . Vaping Use: Never used  Substance and Sexual Activity  . Alcohol use: No  . Drug use: No  . Sexual activity: Never  Other Topics Concern  . Not on file  Social History Narrative   Living in [place] since 05/09/2016   Widowed   Former smoked -stopped 1968   Alcohol none   Exercise -none. Sits in wheelchair using her feet to move around   DNR, POA, Living will   Social Determinants of Health   Financial Resource Strain: Not on file  Food Insecurity: Not on file  Transportation Needs: Not on file  Physical Activity: Not on file  Stress: Not on file  Social Connections: Not on file  Intimate Partner Violence: Not on file    Functional Status Survey:    Family History  Problem Relation Age of Onset  . Cancer Sister        brain  . Arthritis Daughter   . Hypothyroidism Daughter   . Hypertension Son   . Arthritis Son     Health Maintenance  Topic Date Due  . Zoster Vaccines- Shingrix (1 of 2) Never done  . COVID-19 Vaccine (3 - Booster for Moderna series) 06/11/2020  . TETANUS/TDAP  04/12/2021  . INFLUENZA VACCINE  07/13/2021  . DEXA SCAN  Completed  . PNA vac Low Risk Adult  Completed  . HPV VACCINES  Aged Out    Allergies  Allergen Reactions  . Morphine Sulfate Other (See Comments)    "extremely nauseated"  . Other Nausea And Vomiting    Pt states she is very sensitive to pain medications.    Allergies as of 05/19/2021      Reactions   Morphine Sulfate Other (See Comments)   "extremely nauseated"   Other Nausea And Vomiting   Pt states she is very sensitive to pain medications.      Medication List       Accurate as of May 14, 2021  9:49 AM. If you have any questions, ask your nurse or doctor.        LORazepam 1 MG tablet Commonly known as: ATIVAN Take 0.5 mg by mouth every 4 (four) hours as needed for anxiety.   morphine 20 MG/ML concentrated solution Commonly known as: ROXANOL Take 20  mg by mouth every 2 (two) hours as needed for severe pain.   morphine 20 MG/ML concentrated solution Commonly known as: ROXANOL Take 20 mg by mouth every 4 (four) hours as needed for severe pain.   ondansetron 4 MG disintegrating tablet Commonly known as: ZOFRAN-ODT Take 4 mg by mouth every 4 (four) hours as needed for nausea or vomiting.       Review of Systems  Unable to perform ROS: Patient unresponsive    Vitals:   05/17/2021 0936  BP: 110/60  Pulse: (!) 138  Temp: 99.2 F (37.3 C)  SpO2: (!) 83%  Weight: 149 lb (67.6 kg)  Height: 5\' 7"  (1.702  m)   Body mass index is 23.34 kg/m. Physical Exam Vitals reviewed.  Constitutional:      Comments: Unresponsive  HENT:     Head: Normocephalic.     Nose: Nose normal.     Mouth/Throat:     Mouth: Mucous membranes are dry.  Cardiovascular:     Rate and Rhythm: Tachycardia present. Rhythm irregular.     Pulses: Normal pulses.  Pulmonary:     Effort: Pulmonary effort is normal.     Breath sounds: No rales.  Abdominal:     General: Abdomen is flat. Bowel sounds are normal.     Palpations: Abdomen is soft.  Musculoskeletal:     Cervical back: Neck supple.     Comments: Cold and Blue  Skin:    Comments: Cold   Neurological:     Comments: Not responsive  Psychiatric:        Mood and Affect: Mood normal.        Thought Content: Thought content normal.     Labs reviewed: Basic Metabolic Panel: Recent Labs    06/17/20 0000 09/10/20 0000 02/05/21 0000 04/07/21 0000 05/07/21 1454  NA  --    < > 139 135* 134*  K  --    < > 4.4 3.5 4.0  CL  --    < > 105 95* 96*  CO2  --    < > 27* 30* 29  GLUCOSE  --   --   --   --  88  BUN  --    < > 30* 45* 36*  CREATININE  --    < > 0.9 1.2* 0.82  CALCIUM  --    < > 9.2 10.9* 9.6  MG 2.1  --   --   --   --    < > = values in this interval not displayed.   Liver Function Tests: Recent Labs    02/05/21 0000 04/07/21 0000 05/07/21 1454  AST 19 23 25   ALT 10 11 14    ALKPHOS 46 54 52  BILITOT  --   --  0.5  PROT  --   --  6.4*  ALBUMIN 3.6 3.8 3.5   No results for input(s): LIPASE, AMYLASE in the last 8760 hours. No results for input(s): AMMONIA in the last 8760 hours. CBC: Recent Labs    02/05/21 0000 02/12/21 0000 04/07/21 0000 05/07/21 1454  WBC 6.1 6.8 5.3 5.8  NEUTROABS 3,532.00  --  2,602.00 3.7  HGB 10.9* 12.5 11.0* 10.0*  HCT 33* 38 33* 31.5*  MCV  --   --   --  93.2  PLT 133* 166 154 158   Cardiac Enzymes: No results for input(s): CKTOTAL, CKMB, CKMBINDEX, TROPONINI in the last 8760 hours. BNP: Invalid input(s): POCBNP Lab Results  Component Value Date   HGBA1C 5.4 05/17/2017   Lab Results  Component Value Date   TSH 1.82 03/07/2018   Lab Results  Component Value Date   BRAXENMM76 808 11/03/2014   Lab Results  Component Value Date   FOLATE 16.0 11/03/2014   No results found for: IRON, TIBC, FERRITIN  Imaging and Procedures obtained prior to SNF admission: DG Lumbar Spine Complete  Result Date: 05/07/2021 CLINICAL DATA:  Recent fall with low back pain, initial encounter EXAM: LUMBAR SPINE - COMPLETE 4+ VIEW COMPARISON:  None. FINDINGS: Five lumbar type vertebral bodies are well visualized. Scoliosis concave to the right at the thoracolumbar junction is noted. No pars defects are  seen. Multilevel disc space narrowing is noted. No compression deformities are seen. Soft tissue structures are within normal limits. IMPRESSION: Degenerative change and scoliosis as described without acute abnormality. Electronically Signed   By: Inez Catalina M.D.   On: 05/07/2021 13:52   DG Shoulder Right  Result Date: 05/07/2021 CLINICAL DATA:  Recent fall with right shoulder pain, initial encounter EXAM: RIGHT SHOULDER - 2+ VIEW COMPARISON:  04/11/2008 FINDINGS: Right shoulder replacement is now seen. Degenerative changes of the glenoid are noted. The humeral head replacement is somewhat high-riding. Degenerative changes of the  acromioclavicular joint are seen. No other focal abnormality is noted. IMPRESSION: Degenerative and postop change without acute abnormality. Electronically Signed   By: Inez Catalina M.D.   On: 05/07/2021 13:56   CT Head Wo Contrast  Result Date: 05/07/2021 CLINICAL DATA:  Diffuse pain.  Patient found down today. EXAM: CT HEAD WITHOUT CONTRAST CT CERVICAL SPINE WITHOUT CONTRAST TECHNIQUE: Multidetector CT imaging of the head and cervical spine was performed following the standard protocol without intravenous contrast. Multiplanar CT image reconstructions of the cervical spine were also generated. COMPARISON:  Brain MRI 08/12/2016. FINDINGS: CT HEAD FINDINGS Brain: No evidence of acute infarction, hemorrhage, hydrocephalus, extra-axial collection or mass lesion/mass effect. Vascular: No hyperdense vessel or unexpected calcification. Skull: Intact.  No focal lesion. Sinuses/Orbits: Negative. Other: None. CT CERVICAL SPINE FINDINGS Alignment: Alignment. Skull base and vertebrae: The patient is status post C3-5 fusion. There is bony destructive change in the inferior aspect of C3, throughout C4 and in the majority of C5. Destructive change involves the posterior elements on the right at each of these levels and the posterior elements on the left at C4. Soft tissue windows demonstrate a mass lesion extending out of the vertebral bodies into the central canal which appears to cause severe central canal stenosis. Soft tissues and spinal canal: As above. Disc levels: Marked loss of disc space height is present at C6-7. Scattered facet degenerative change noted. Upper chest: Lung apices demonstrate emphysematous change. Other: None. IMPRESSION: Destructive process from C4 to C5 is centered in C4. A mass lesion extends into the central canal at these levels and appears to cause severe stenosis, worst at C4. Recommend cervical spine MRI with and without contrast further evaluation. Negative for trauma head or cervical  spine. Emphysema (ICD10-J43.9). Electronically Signed   By: Inge Rise M.D.   On: 05/07/2021 14:17   CT CERVICAL SPINE WO CONTRAST  Result Date: 05/07/2021 CLINICAL DATA:  Diffuse pain.  Patient found down today. EXAM: CT HEAD WITHOUT CONTRAST CT CERVICAL SPINE WITHOUT CONTRAST TECHNIQUE: Multidetector CT imaging of the head and cervical spine was performed following the standard protocol without intravenous contrast. Multiplanar CT image reconstructions of the cervical spine were also generated. COMPARISON:  Brain MRI 08/12/2016. FINDINGS: CT HEAD FINDINGS Brain: No evidence of acute infarction, hemorrhage, hydrocephalus, extra-axial collection or mass lesion/mass effect. Vascular: No hyperdense vessel or unexpected calcification. Skull: Intact.  No focal lesion. Sinuses/Orbits: Negative. Other: None. CT CERVICAL SPINE FINDINGS Alignment: Alignment. Skull base and vertebrae: The patient is status post C3-5 fusion. There is bony destructive change in the inferior aspect of C3, throughout C4 and in the majority of C5. Destructive change involves the posterior elements on the right at each of these levels and the posterior elements on the left at C4. Soft tissue windows demonstrate a mass lesion extending out of the vertebral bodies into the central canal which appears to cause severe central canal stenosis. Soft tissues and  spinal canal: As above. Disc levels: Marked loss of disc space height is present at C6-7. Scattered facet degenerative change noted. Upper chest: Lung apices demonstrate emphysematous change. Other: None. IMPRESSION: Destructive process from C4 to C5 is centered in C4. A mass lesion extends into the central canal at these levels and appears to cause severe stenosis, worst at C4. Recommend cervical spine MRI with and without contrast further evaluation. Negative for trauma head or cervical spine. Emphysema (ICD10-J43.9). Electronically Signed   By: Inge Rise M.D.   On: 05/07/2021  14:17   DG Chest Port 1 View  Result Date: 05/07/2021 CLINICAL DATA:  Shortness of breath. EXAM: PORTABLE CHEST 1 VIEW COMPARISON:  May 10, 2016. FINDINGS: Linear opacities at the left base, similar over multiple priors and likely scarring. No new consolidation. No visible pleural effusions or pneumothorax on this limited semi erect single radiograph. Enlarged cardiac silhouette, likely accentuated by AP technique. Partially imaged ACDF. Partially imaged bilateral shoulder arthroplasties. IMPRESSION: 1. No evidence of acute cardiopulmonary disease. 2. Linear opacities at the left base, similar over multiple priors and likely scarring. Electronically Signed   By: Margaretha Sheffield MD   On: 05/07/2021 14:59   DG Shoulder Left  Result Date: 05/07/2021 CLINICAL DATA:  Recent fall with left shoulder pain, initial encounter EXAM: LEFT SHOULDER - 2+ VIEW COMPARISON:  12/29/2017 FINDINGS: Left shoulder replacement is seen. The overall appearance is stable from the prior exam. Mild degenerative changes of the acromioclavicular joint are seen. Visualize ribcage is within normal limits. Changes of prior fusion in the cervical spine are noted. IMPRESSION: Postsurgical change without acute abnormality. Electronically Signed   By: Inez Catalina M.D.   On: 05/07/2021 13:54   DG Foot Complete Right  Result Date: 05/07/2021 CLINICAL DATA:  Recent fall with foot pain, initial encounter EXAM: RIGHT FOOT COMPLETE - 3+ VIEW COMPARISON:  None. FINDINGS: Hallux valgus deformity is noted. No acute fracture or dislocation is noted. No soft tissue abnormality is noted. Vascular calcifications are seen. IMPRESSION: Degenerative change without acute abnormality. Electronically Signed   By: Inez Catalina M.D.   On: 05/07/2021 13:48   DG Hip Unilat With Pelvis 2-3 Views Left  Result Date: 05/07/2021 CLINICAL DATA:  Recent fall with left hip pain, initial encounter EXAM: DG HIP (WITH OR WITHOUT PELVIS) 3V LEFT COMPARISON:  None.  FINDINGS: Pelvic ring is intact. Degenerative changes of lumbar spine are again noted. No acute fracture or dislocation is noted. Mild degenerative changes of the hip joints are seen. IMPRESSION: Degenerative change without acute abnormality. Electronically Signed   By: Inez Catalina M.D.   On: 05/07/2021 13:53    Assessment/Plan End of Life Care Patient is Comfortable D/w the daughter Continue on Roxanol and Ativan.   Family/ staff Communication:   Labs/tests ordered:

## 2021-06-12 DEATH — deceased
